# Patient Record
Sex: Female | Born: 1945 | Race: Black or African American | Hispanic: No | Marital: Married | Smoking: Current every day smoker
Health system: Southern US, Community
[De-identification: ages and names within clinical notes are randomized; demographics above are authoritative.]

## PROBLEM LIST (undated history)

## (undated) DIAGNOSIS — E039 Hypothyroidism, unspecified: Secondary | ICD-10-CM

## (undated) DIAGNOSIS — E1140 Type 2 diabetes mellitus with diabetic neuropathy, unspecified: Secondary | ICD-10-CM

## (undated) DIAGNOSIS — K5900 Constipation, unspecified: Secondary | ICD-10-CM

## (undated) DIAGNOSIS — Z9289 Personal history of other medical treatment: Secondary | ICD-10-CM

## (undated) DIAGNOSIS — E7800 Pure hypercholesterolemia, unspecified: Secondary | ICD-10-CM

## (undated) DIAGNOSIS — E079 Disorder of thyroid, unspecified: Secondary | ICD-10-CM

## (undated) DIAGNOSIS — J189 Pneumonia, unspecified organism: Secondary | ICD-10-CM

## (undated) DIAGNOSIS — E119 Type 2 diabetes mellitus without complications: Secondary | ICD-10-CM

## (undated) DIAGNOSIS — M1990 Unspecified osteoarthritis, unspecified site: Secondary | ICD-10-CM

## (undated) DIAGNOSIS — I10 Essential (primary) hypertension: Secondary | ICD-10-CM

## (undated) HISTORY — PX: ABDOMINAL HYSTERECTOMY: SHX81

## (undated) HISTORY — PX: APPENDECTOMY: SHX54

## (undated) HISTORY — PX: TONSILLECTOMY: SUR1361

## (undated) HISTORY — PX: BACK SURGERY: SHX140

## (undated) HISTORY — PX: TOTAL THYROIDECTOMY: SHX2547

## (undated) HISTORY — PX: SHOULDER SURGERY: SHX246

## (undated) HISTORY — PX: THYROID SURGERY: SHX805

## (undated) HISTORY — PX: EXPLORATORY LAPAROTOMY: SUR591

---

## 2004-06-04 ENCOUNTER — Encounter: Payer: Self-pay | Admitting: Internal Medicine

## 2004-07-01 ENCOUNTER — Ambulatory Visit: Payer: Self-pay | Admitting: Internal Medicine

## 2004-07-18 ENCOUNTER — Ambulatory Visit: Payer: Self-pay | Admitting: Internal Medicine

## 2004-08-05 ENCOUNTER — Encounter: Payer: Self-pay | Admitting: Internal Medicine

## 2004-08-15 ENCOUNTER — Ambulatory Visit: Payer: Self-pay | Admitting: Internal Medicine

## 2004-10-10 ENCOUNTER — Ambulatory Visit: Payer: Self-pay | Admitting: Internal Medicine

## 2004-10-24 ENCOUNTER — Ambulatory Visit: Payer: Self-pay | Admitting: Internal Medicine

## 2004-12-19 ENCOUNTER — Ambulatory Visit: Payer: Self-pay | Admitting: Internal Medicine

## 2005-02-24 ENCOUNTER — Ambulatory Visit: Payer: Self-pay | Admitting: Internal Medicine

## 2005-04-11 ENCOUNTER — Encounter: Payer: Self-pay | Admitting: Internal Medicine

## 2005-04-28 ENCOUNTER — Ambulatory Visit: Payer: Self-pay | Admitting: Internal Medicine

## 2005-10-27 ENCOUNTER — Ambulatory Visit: Payer: Self-pay | Admitting: Internal Medicine

## 2005-11-27 ENCOUNTER — Ambulatory Visit: Payer: Self-pay | Admitting: Internal Medicine

## 2005-11-27 LAB — CONVERTED CEMR LAB
ALT: 17 units/L (ref 0–40)
Calcium: 9.1 mg/dL (ref 8.4–10.5)
Chloride: 104 meq/L (ref 96–112)
Creatinine, Ser: 0.9 mg/dL (ref 0.4–1.2)
Creatinine,U: 97.7 mg/dL
Glucose, Bld: 83 mg/dL (ref 70–99)
Hemoglobin: 12.6 g/dL (ref 12.0–15.0)
Hgb A1c MFr Bld: 5.2 % (ref 4.6–6.0)
MCV: 90.4 fL (ref 78.0–100.0)
Microalb Creat Ratio: 3.1 mg/g (ref 0.0–30.0)
Sodium: 141 meq/L (ref 135–145)
TSH: 0.48 microintl units/mL (ref 0.35–5.50)
WBC: 5.2 10*3/uL (ref 4.5–10.5)

## 2006-03-11 DIAGNOSIS — G609 Hereditary and idiopathic neuropathy, unspecified: Secondary | ICD-10-CM

## 2006-03-11 DIAGNOSIS — E049 Nontoxic goiter, unspecified: Secondary | ICD-10-CM | POA: Insufficient documentation

## 2006-03-11 DIAGNOSIS — M949 Disorder of cartilage, unspecified: Secondary | ICD-10-CM

## 2006-03-11 DIAGNOSIS — M216X9 Other acquired deformities of unspecified foot: Secondary | ICD-10-CM

## 2006-03-11 DIAGNOSIS — I10 Essential (primary) hypertension: Secondary | ICD-10-CM | POA: Insufficient documentation

## 2006-03-11 DIAGNOSIS — M899 Disorder of bone, unspecified: Secondary | ICD-10-CM | POA: Insufficient documentation

## 2006-08-01 ENCOUNTER — Ambulatory Visit: Payer: Self-pay | Admitting: Internal Medicine

## 2006-08-01 DIAGNOSIS — E119 Type 2 diabetes mellitus without complications: Secondary | ICD-10-CM

## 2006-08-01 DIAGNOSIS — K56609 Unspecified intestinal obstruction, unspecified as to partial versus complete obstruction: Secondary | ICD-10-CM | POA: Insufficient documentation

## 2006-08-04 LAB — CONVERTED CEMR LAB
BUN: 11 mg/dL (ref 6–23)
Calcium: 9 mg/dL (ref 8.4–10.5)
Chloride: 106 meq/L (ref 96–112)
Cholesterol: 170 mg/dL (ref 0–200)
GFR calc Af Amer: 111 mL/min
GFR calc non Af Amer: 91 mL/min
Glucose, Bld: 69 mg/dL — ABNORMAL LOW (ref 70–99)
Hgb A1c MFr Bld: 5 % (ref 4.6–6.0)
LDL Cholesterol: 101 mg/dL — ABNORMAL HIGH (ref 0–99)
Microalb Creat Ratio: 2 mg/g (ref 0.0–30.0)
Microalb, Ur: 0.2 mg/dL (ref 0.0–1.9)
TSH: 0.37 microintl units/mL (ref 0.35–5.50)
Total CHOL/HDL Ratio: 3.5

## 2006-09-04 ENCOUNTER — Ambulatory Visit: Payer: Self-pay | Admitting: Internal Medicine

## 2006-09-05 ENCOUNTER — Encounter (INDEPENDENT_AMBULATORY_CARE_PROVIDER_SITE_OTHER): Payer: Self-pay | Admitting: *Deleted

## 2006-09-05 LAB — CONVERTED CEMR LAB: OCCULT 2: NEGATIVE

## 2006-11-22 ENCOUNTER — Telehealth (INDEPENDENT_AMBULATORY_CARE_PROVIDER_SITE_OTHER): Payer: Self-pay | Admitting: *Deleted

## 2006-12-17 ENCOUNTER — Encounter: Payer: Self-pay | Admitting: Internal Medicine

## 2006-12-18 ENCOUNTER — Telehealth (INDEPENDENT_AMBULATORY_CARE_PROVIDER_SITE_OTHER): Payer: Self-pay | Admitting: *Deleted

## 2006-12-18 ENCOUNTER — Encounter: Payer: Self-pay | Admitting: Internal Medicine

## 2006-12-18 ENCOUNTER — Other Ambulatory Visit: Admission: RE | Admit: 2006-12-18 | Discharge: 2006-12-18 | Payer: Self-pay | Admitting: Internal Medicine

## 2006-12-18 ENCOUNTER — Ambulatory Visit: Payer: Self-pay | Admitting: Internal Medicine

## 2006-12-18 DIAGNOSIS — K620 Anal polyp: Secondary | ICD-10-CM | POA: Insufficient documentation

## 2006-12-18 DIAGNOSIS — K621 Rectal polyp: Secondary | ICD-10-CM

## 2006-12-18 DIAGNOSIS — E785 Hyperlipidemia, unspecified: Secondary | ICD-10-CM

## 2006-12-24 ENCOUNTER — Encounter (INDEPENDENT_AMBULATORY_CARE_PROVIDER_SITE_OTHER): Payer: Self-pay | Admitting: *Deleted

## 2007-01-27 ENCOUNTER — Encounter: Payer: Self-pay | Admitting: Internal Medicine

## 2007-03-01 ENCOUNTER — Encounter: Payer: Self-pay | Admitting: Internal Medicine

## 2007-03-05 ENCOUNTER — Telehealth (INDEPENDENT_AMBULATORY_CARE_PROVIDER_SITE_OTHER): Payer: Self-pay | Admitting: *Deleted

## 2007-12-24 ENCOUNTER — Telehealth (INDEPENDENT_AMBULATORY_CARE_PROVIDER_SITE_OTHER): Payer: Self-pay | Admitting: Internal Medicine

## 2010-08-02 ENCOUNTER — Emergency Department (HOSPITAL_BASED_OUTPATIENT_CLINIC_OR_DEPARTMENT_OTHER)
Admission: EM | Admit: 2010-08-02 | Discharge: 2010-08-02 | Disposition: A | Discharge status: Home / Self Care | Payer: 59 | Attending: Emergency Medicine | Admitting: Emergency Medicine

## 2010-08-02 ENCOUNTER — Emergency Department (INDEPENDENT_AMBULATORY_CARE_PROVIDER_SITE_OTHER): Payer: 59

## 2010-08-02 ENCOUNTER — Encounter: Payer: Self-pay | Admitting: Family Medicine

## 2010-08-02 ENCOUNTER — Other Ambulatory Visit: Payer: Self-pay

## 2010-08-02 DIAGNOSIS — M5126 Other intervertebral disc displacement, lumbar region: Secondary | ICD-10-CM

## 2010-08-02 DIAGNOSIS — I10 Essential (primary) hypertension: Secondary | ICD-10-CM

## 2010-08-02 DIAGNOSIS — K7689 Other specified diseases of liver: Secondary | ICD-10-CM

## 2010-08-02 DIAGNOSIS — R079 Chest pain, unspecified: Secondary | ICD-10-CM

## 2010-08-02 DIAGNOSIS — R109 Unspecified abdominal pain: Secondary | ICD-10-CM

## 2010-08-02 DIAGNOSIS — K5730 Diverticulosis of large intestine without perforation or abscess without bleeding: Secondary | ICD-10-CM | POA: Insufficient documentation

## 2010-08-02 DIAGNOSIS — Z9089 Acquired absence of other organs: Secondary | ICD-10-CM | POA: Insufficient documentation

## 2010-08-02 DIAGNOSIS — R1013 Epigastric pain: Secondary | ICD-10-CM | POA: Insufficient documentation

## 2010-08-02 DIAGNOSIS — I708 Atherosclerosis of other arteries: Secondary | ICD-10-CM | POA: Insufficient documentation

## 2010-08-02 DIAGNOSIS — I700 Atherosclerosis of aorta: Secondary | ICD-10-CM | POA: Insufficient documentation

## 2010-08-02 DIAGNOSIS — Z90710 Acquired absence of both cervix and uterus: Secondary | ICD-10-CM | POA: Insufficient documentation

## 2010-08-02 DIAGNOSIS — F17200 Nicotine dependence, unspecified, uncomplicated: Secondary | ICD-10-CM | POA: Insufficient documentation

## 2010-08-02 DIAGNOSIS — E119 Type 2 diabetes mellitus without complications: Secondary | ICD-10-CM | POA: Insufficient documentation

## 2010-08-02 HISTORY — DX: Pneumonia, unspecified organism: J18.9

## 2010-08-02 HISTORY — DX: Essential (primary) hypertension: I10

## 2010-08-02 LAB — CBC
HCT: 39.2 % (ref 36.0–46.0)
Hemoglobin: 12.9 g/dL (ref 12.0–15.0)
MCV: 96.8 fL (ref 78.0–100.0)
RBC: 4.05 MIL/uL (ref 3.87–5.11)
RDW: 13.5 % (ref 11.5–15.5)
WBC: 6.1 10*3/uL (ref 4.0–10.5)

## 2010-08-02 LAB — RAPID URINE DRUG SCREEN, HOSP PERFORMED
Barbiturates: NOT DETECTED
Benzodiazepines: NOT DETECTED
Cocaine: NOT DETECTED
Opiates: NOT DETECTED
Tetrahydrocannabinol: NOT DETECTED

## 2010-08-02 LAB — COMPREHENSIVE METABOLIC PANEL
Albumin: 4.3 g/dL (ref 3.5–5.2)
Alkaline Phosphatase: 95 U/L (ref 39–117)
BUN: 15 mg/dL (ref 6–23)
Potassium: 3.9 mEq/L (ref 3.5–5.1)
Total Protein: 7.6 g/dL (ref 6.0–8.3)

## 2010-08-02 LAB — URINALYSIS, ROUTINE W REFLEX MICROSCOPIC
Bilirubin Urine: NEGATIVE
Glucose, UA: 100 mg/dL — AB
Hgb urine dipstick: NEGATIVE
Specific Gravity, Urine: 1.031 — ABNORMAL HIGH (ref 1.005–1.030)
pH: 6.5 (ref 5.0–8.0)

## 2010-08-02 LAB — DIFFERENTIAL
Eosinophils Relative: 6 % — ABNORMAL HIGH (ref 0–5)
Lymphocytes Relative: 34 % (ref 12–46)
Lymphs Abs: 2 10*3/uL (ref 0.7–4.0)
Monocytes Absolute: 0.6 10*3/uL (ref 0.1–1.0)
Monocytes Relative: 9 % (ref 3–12)

## 2010-08-02 MED ORDER — IOHEXOL 300 MG/ML  SOLN
80.0000 mL | Freq: Once | INTRAMUSCULAR | Status: AC | PRN
  Administered 2010-08-02: 80 mL via INTRAVENOUS

## 2010-08-02 MED ORDER — SODIUM CHLORIDE 0.9 % IV SOLN
80.0000 mg | Freq: Once | INTRAVENOUS | Status: AC
  Administered 2010-08-02: 80 mg via INTRAVENOUS
  Filled 2010-08-02: qty 80

## 2010-08-02 MED ORDER — ONDANSETRON HCL 4 MG/2ML IJ SOLN
4.0000 mg | Freq: Once | INTRAMUSCULAR | Status: AC
  Administered 2010-08-02: 4 mg via INTRAVENOUS
  Filled 2010-08-02: qty 2

## 2010-08-02 MED ORDER — LORAZEPAM 2 MG/ML IJ SOLN
1.0000 mg | Freq: Once | INTRAMUSCULAR | Status: AC
  Administered 2010-08-02: 1 mg via INTRAVENOUS
  Filled 2010-08-02: qty 1

## 2010-08-02 MED ORDER — SODIUM CHLORIDE 0.9 % IV BOLUS (SEPSIS)
500.0000 mL | Freq: Once | INTRAVENOUS | Status: AC
  Administered 2010-08-02: 1000 mL via INTRAVENOUS

## 2010-08-02 MED ORDER — PANTOPRAZOLE SODIUM 20 MG PO TBEC: 40.0000 mg | DELAYED_RELEASE_TABLET | Freq: Every day | ORAL | Status: DC

## 2010-08-02 MED ORDER — HYDROMORPHONE HCL 1 MG/ML IJ SOLN
1.0000 mg | Freq: Once | INTRAMUSCULAR | Status: AC
  Administered 2010-08-02: 1 mg via INTRAVENOUS
  Filled 2010-08-02: qty 1

## 2010-08-02 NOTE — ED Notes (Signed)
Pt sts "pressure in chest is gone but my back still hurts". Pt c/o right side pain with movement in bed. Pt currently drinking oral contrast.

## 2010-08-02 NOTE — ED Notes (Signed)
Pt c/o chest pain and shortness of breath described as pressure mid-chest since awakening yesterday. Pt sts pain is worse this morning and now radiating to right side and back.

## 2010-08-02 NOTE — ED Notes (Signed)
Pt denies pain and sts she feels "better".

## 2010-08-02 NOTE — ED Provider Notes (Signed)
History     Chief Complaint  Patient presents with  . Chest Pain   The history is provided by the patient.   The patient has had epigastic pain radiating to the right flank for two days..  Patient denies nausea but vomited yesterday  BM are normal for patient.  She does drink multiple drinks per day.  She states she coughed up clear phlegm this a.m.  There is increased pain with po intake.  She has a history of sbo but has not had cholecystectomy or history of pancreatitis.  She denies fever or chills.  The pain described by rn as chest is in the epigastrium.   Past Medical History  Diagnosis Date  . Hypertension   . Diabetes mellitus   . Pneumonia     Past Surgical History  Procedure Date  . Abdominal hysterectomy   . Tonsillectomy   . Appendectomy     No family history on file.  History  Substance Use Topics  . Smoking status: Current Everyday Smoker  . Smokeless tobacco: Not on file  . Alcohol Use: Yes    OB History    Grav Para Term Preterm Abortions TAB SAB Ect Mult Living                  Review of Systems  All other systems reviewed and are negative.    Physical Exam  SpO2 100%  Physical Exam  Nursing note and vitals reviewed. Constitutional: She appears well-developed and well-nourished.       Morbidly obese  HENT:  Head: Normocephalic and atraumatic.  Eyes: Pupils are equal, round, and reactive to light.  Neck: Normal range of motion.  Cardiovascular: Normal rate and regular rhythm.   Pulmonary/Chest: Effort normal and breath sounds normal.  Abdominal: Soft. Bowel sounds are normal. There is tenderness.    Musculoskeletal: Normal range of motion.  Neurological: She is alert.  Skin: Skin is warm and dry.  Psychiatric: She has a normal mood and affect.    ED Course  Procedures  MDM Labs normal except bs elevated. CT and chest x-Kristjan Derner without apparent acute change or need for surgical intervention.  Patient improved here and no vomiting.   Results discussed with patient and family. Results for orders placed during the hospital encounter of 08/02/10  COMPREHENSIVE METABOLIC PANEL      Component Value Range   Sodium 144  135 - 145 (mEq/L)   Potassium 3.9  3.5 - 5.1 (mEq/L)   Chloride 103  96 - 112 (mEq/L)   CO2 31  19 - 32 (mEq/L)   Glucose, Bld 169 (*) 70 - 99 (mg/dL)   BUN 15  6 - 23 (mg/dL)   Creatinine, Ser 1.47  0.50 - 1.10 (mg/dL)   Calcium 9.5  8.4 - 82.9 (mg/dL)   Total Protein 7.6  6.0 - 8.3 (g/dL)   Albumin 4.3  3.5 - 5.2 (g/dL)   AST 27  0 - 37 (U/L)   ALT 38 (*) 0 - 35 (U/L)   Alkaline Phosphatase 95  39 - 117 (U/L)   Total Bilirubin 0.3  0.3 - 1.2 (mg/dL)   GFR calc non Af Amer >60  >60 (mL/min)   GFR calc Af Amer >60  >60 (mL/min)  CBC      Component Value Range   WBC 6.1  4.0 - 10.5 (K/uL)   RBC 4.05  3.87 - 5.11 (MIL/uL)   Hemoglobin 12.9  12.0 - 15.0 (g/dL)   HCT  39.2  36.0 - 46.0 (%)   MCV 96.8  78.0 - 100.0 (fL)   MCH 31.9  26.0 - 34.0 (pg)   MCHC 32.9  30.0 - 36.0 (g/dL)   RDW 40.9  81.1 - 91.4 (%)   Platelets 282  150 - 400 (K/uL)  DIFFERENTIAL      Component Value Range   Neutrophils Relative 51  43 - 77 (%)   Neutro Abs 3.1  1.7 - 7.7 (K/uL)   Lymphocytes Relative 34  12 - 46 (%)   Lymphs Abs 2.0  0.7 - 4.0 (K/uL)   Monocytes Relative 9  3 - 12 (%)   Monocytes Absolute 0.6  0.1 - 1.0 (K/uL)   Eosinophils Relative 6 (*) 0 - 5 (%)   Eosinophils Absolute 0.3  0.0 - 0.7 (K/uL)   Basophils Relative 0  0 - 1 (%)   Basophils Absolute 0.0  0.0 - 0.1 (K/uL)  LIPASE, BLOOD      Component Value Range   Lipase 14  11 - 59 (U/L)  URINALYSIS, ROUTINE W REFLEX MICROSCOPIC      Component Value Range   Color, Urine YELLOW  YELLOW    Appearance CLEAR  CLEAR    Specific Gravity, Urine 1.031 (*) 1.005 - 1.030    pH 6.5  5.0 - 8.0    Glucose, UA 100 (*) NEGATIVE (mg/dL)   Hgb urine dipstick NEGATIVE  NEGATIVE    Bilirubin Urine NEGATIVE  NEGATIVE    Ketones, ur NEGATIVE  NEGATIVE (mg/dL)    Protein, ur NEGATIVE  NEGATIVE (mg/dL)   Urobilinogen, UA 1.0  0.0 - 1.0 (mg/dL)   Nitrite NEGATIVE  NEGATIVE    Leukocytes, UA NEGATIVE  NEGATIVE   DRUG SCREEN PANEL, EMERGENCY      Component Value Range   Opiates NONE DETECTED  NONE DETECTED    Cocaine NONE DETECTED  NONE DETECTED    Benzodiazepines NONE DETECTED  NONE DETECTED    Amphetamines NONE DETECTED  NONE DETECTED    Tetrahydrocannabinol NONE DETECTED  NONE DETECTED    Barbiturates NONE DETECTED  NONE DETECTED   Dg Chest 2 View  08/02/2010  *RADIOLOGY REPORT*  Clinical Data: Chest pain.  CHEST - 2 VIEW  Comparison: None  Findings: The cardiac silhouette, mediastinal and hilar contours are within normal limits.  The lungs are clear.  The bony thorax is intact.  IMPRESSION: No acute cardiopulmonary findings.  Original Report Authenticated By: P. Loralie Champagne, M.D.   Ct Abdomen Pelvis W Contrast  08/02/2010  *RADIOLOGY REPORT*  Clinical Data: Abdominal pain; history of appendectomy and hysterectomy; hypertension and diabetes  CT ABDOMEN AND PELVIS WITH CONTRAST  Technique:  Multidetector CT imaging of the abdomen and pelvis was performed following the standard protocol during bolus administration of intravenous contrast.  Contrast: 80 ml Omni 300  Comparison: None.  Findings: There is diffuse fatty infiltration of the liver. Superior to the gallbladder fossa at the portal vein bifurcation level, there is a 4.5 cm area of ill-defined hypodensity which exhibits no mass effect.  Normal vessels course through the area so this is felt to represent a more severe area of focal fatty infiltration.  There is also a small area of focal fatty sparing adjacent to the gallbladder fossa.    The lung bases are clear. The spleen, pancreas, adrenal glands, kidneys, urinary bladder have a normal appearance.  There is a small sclerotic bone island in the left femoral head.  Degenerative changes are noted  within the facet joint hypertrophy of the lower lumbar  spine.  A disc bulge is noted at L4-L5. There is atherosclerotic calcification within the distal abdominal aorta and iliac arteries.  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  The bowel is unremarkable with no evidence of gross inflammation or obstruction. There are several right-sided diverticula but no radiographic evidence of diverticulitis.  There is no pneumoperitoneum.  No free fluid or adenopathy are seen within the abdomen or pelvis.  IMPRESSION:  Marked fatty infiltration of the liver.  Diffuse disc bulge at L4-L5 and facet joint hypertrophy at the lower lumbar spine.  Original Report Authenticated By: Brandon Melnick, M.D.       Hilario Quarry, MD 08/02/10 1311  Hilario Quarry, MD 08/19/10 [phone]

## 2011-11-25 ENCOUNTER — Emergency Department (HOSPITAL_BASED_OUTPATIENT_CLINIC_OR_DEPARTMENT_OTHER): Payer: Managed Care, Other (non HMO)

## 2011-11-25 ENCOUNTER — Emergency Department (HOSPITAL_BASED_OUTPATIENT_CLINIC_OR_DEPARTMENT_OTHER)
Admission: EM | Admit: 2011-11-25 | Discharge: 2011-11-25 | Disposition: A | Discharge status: Home / Self Care | Payer: Managed Care, Other (non HMO) | Attending: Emergency Medicine | Admitting: Emergency Medicine

## 2011-11-25 ENCOUNTER — Encounter (HOSPITAL_BASED_OUTPATIENT_CLINIC_OR_DEPARTMENT_OTHER): Payer: Self-pay | Admitting: Emergency Medicine

## 2011-11-25 DIAGNOSIS — E119 Type 2 diabetes mellitus without complications: Secondary | ICD-10-CM | POA: Insufficient documentation

## 2011-11-25 DIAGNOSIS — R1013 Epigastric pain: Secondary | ICD-10-CM | POA: Insufficient documentation

## 2011-11-25 DIAGNOSIS — R079 Chest pain, unspecified: Secondary | ICD-10-CM | POA: Insufficient documentation

## 2011-11-25 DIAGNOSIS — Z8701 Personal history of pneumonia (recurrent): Secondary | ICD-10-CM | POA: Insufficient documentation

## 2011-11-25 DIAGNOSIS — I10 Essential (primary) hypertension: Secondary | ICD-10-CM | POA: Insufficient documentation

## 2011-11-25 DIAGNOSIS — F17200 Nicotine dependence, unspecified, uncomplicated: Secondary | ICD-10-CM | POA: Insufficient documentation

## 2011-11-25 DIAGNOSIS — Z79899 Other long term (current) drug therapy: Secondary | ICD-10-CM | POA: Insufficient documentation

## 2011-11-25 DIAGNOSIS — E7800 Pure hypercholesterolemia, unspecified: Secondary | ICD-10-CM | POA: Insufficient documentation

## 2011-11-25 HISTORY — DX: Pure hypercholesterolemia, unspecified: E78.00

## 2011-11-25 HISTORY — DX: Type 2 diabetes mellitus with diabetic neuropathy, unspecified: E11.40

## 2011-11-25 LAB — CBC
HCT: 38.5 % (ref 36.0–46.0)
MCH: 30.7 pg (ref 26.0–34.0)
MCHC: 32.7 g/dL (ref 30.0–36.0)
RDW: 12.9 % (ref 11.5–15.5)

## 2011-11-25 LAB — BASIC METABOLIC PANEL
BUN: 11 mg/dL (ref 6–23)
Calcium: 9.3 mg/dL (ref 8.4–10.5)
GFR calc Af Amer: >90 mL/min (ref >90)
GFR calc non Af Amer: >90 mL/min (ref >90)
Potassium: 3.4 mEq/L — ABNORMAL LOW (ref 3.5–5.1)
Sodium: 139 mEq/L (ref 135–145)

## 2011-11-25 MED ORDER — GI COCKTAIL ~~LOC~~
30.0000 mL | Freq: Once | ORAL | Status: AC
  Administered 2011-11-25: 30 mL via ORAL
  Filled 2011-11-25: qty 30

## 2011-11-25 MED ORDER — OMEPRAZOLE 20 MG PO CPDR: 20.0000 mg | DELAYED_RELEASE_CAPSULE | Freq: Every day | ORAL | Status: DC

## 2011-11-25 MED ORDER — TRAMADOL HCL 50 MG PO TABS: 50.0000 mg | ORAL_TABLET | Freq: Four times a day (QID) | ORAL | Status: DC | PRN

## 2011-11-25 MED ORDER — TRAMADOL HCL 50 MG PO TABS
50.0000 mg | ORAL_TABLET | Freq: Once | ORAL | Status: AC
  Administered 2011-11-25: 50 mg via ORAL
  Filled 2011-11-25: qty 1

## 2011-11-25 MED ORDER — SUCRALFATE 1 G PO TABS: 1.0000 g | ORAL_TABLET | Freq: Four times a day (QID) | ORAL | Status: DC

## 2011-11-25 MED ORDER — ASPIRIN 81 MG PO CHEW
324.0000 mg | CHEWABLE_TABLET | Freq: Once | ORAL | Status: AC
  Administered 2011-11-25: 324 mg via ORAL
  Filled 2011-11-25: qty 4

## 2011-11-25 NOTE — ED Notes (Signed)
Pt c/o generalized abdominal pain, radiating to epigastric area.  Some weakness, some dizziness and nausea yesterday.

## 2011-11-25 NOTE — ED Provider Notes (Signed)
History     CSN: [id number]  Arrival date & time 11/25/11  1132   First MD Initiated Contact with Patient 11/25/11 1159      Chief Complaint  Patient presents with  . Abdominal Pain  . Chest Pain    HPI The patient presents with concerns of ongoing abdominal and chest pain.  Symptoms began yesterday.  Since onset there has been sharp pain from her epigastrum  to her umbilicus, with radiation to sternum and to the posterior.  The pain is better with positioning (upright), worse when supine.  There is mild associated nausea, no vomiting, no diarrhea.  No dyspnea, no other chest pain. Symptoms are otherwise not appreciably changed by anything. Past Medical History  Diagnosis Date  . Hypertension   . Diabetes mellitus   . Pneumonia   . Hypercholesteremia   . Diabetic neuropathy     Past Surgical History  Procedure Date  . Abdominal hysterectomy   . Tonsillectomy   . Appendectomy     No family history on file.  History  Substance Use Topics  . Smoking status: Current Every Day Smoker  . Smokeless tobacco: Not on file  . Alcohol Use: Yes    OB History    Grav Para Term Preterm Abortions TAB SAB Ect Mult Living                  Review of Systems  Constitutional:       Per HPI, otherwise negative  HENT:       Per HPI, otherwise negative  Eyes: Negative.   Respiratory:       Per HPI, otherwise negative  Cardiovascular:       Per HPI, otherwise negative  Gastrointestinal: Positive for nausea. Negative for vomiting and diarrhea.  Genitourinary: Negative.   Musculoskeletal:       Per HPI, otherwise negative  Skin: Negative.   Neurological: Negative for syncope.    Allergies  Review of patient's allergies indicates no known allergies.  Home Medications   Current Outpatient Rx  Name  Route  Sig  Dispense  Refill  . PRAVASTATIN SODIUM 10 MG PO TABS   Oral   Take 10 mg by mouth daily.         . ASPIRIN 81 MG PO TABS   Oral   Take 81 mg by mouth daily.             Marland Kitchen GABAPENTIN 100 MG PO CAPS   Oral   Take 300 mg by mouth 5 (five) times daily.           Marland Kitchen LISINOPRIL 20 MG PO TABS   Oral   Take 20 mg by mouth daily.           Marland Kitchen METFORMIN HCL ER (MOD) 500 MG PO TB24   Oral   Take 500 mg by mouth daily with breakfast.            BP 180/81  Pulse 84  Temp 99.2 F (37.3 C) (Oral)  Resp 18  Ht 5\' 3"  (1.6 m)  Wt 175 lb (79.379 kg)  BMI 31.00 kg/m2  SpO2 95%  Physical Exam  Nursing note and vitals reviewed. Constitutional: She is oriented to person, place, and time. She appears well-developed and well-nourished. No distress.  HENT:  Head: Normocephalic and atraumatic.  Eyes: Conjunctivae normal and EOM are normal.  Cardiovascular: Normal rate and regular rhythm.   Pulmonary/Chest: Effort normal and breath sounds normal. No stridor.  No respiratory distress.  Abdominal: Soft. She exhibits no distension. There is tenderness. There is guarding. There is no rebound.  Musculoskeletal: She exhibits no edema.  Neurological: She is alert and oriented to person, place, and time. No cranial nerve deficit.  Skin: Skin is warm and dry.  Psychiatric: She has a normal mood and affect.    ED Course  Procedures (including critical care time)   Labs Reviewed  CBC  BASIC METABOLIC PANEL  TROPONIN I  LIPASE, BLOOD   No results found.   No diagnosis found.   Date: 11/25/2011  Rate: 78  Rhythm: normal sinus rhythm  QRS Axis: left  Intervals: normal  ST/T Wave abnormalities: normal  Conduction Disutrbances:none  Narrative Interpretation:   Old EKG Reviewed: unchanged UNREMARKABLE  O2- 99%ra, normal   I reviewed the patient's records, including a visit 7/12 for similar complaints.  CT at that time did not demonstrate acute findings, and there was no mention of aortic pathology.  1:40 PM Patient awakened from sleep to discuss results. MDM  This F p/w one day of abdominal pain.  The patient's description of pain that  radiates from her epigastrium to her sternum, her umbilicus and to the posterior there suspicion of gastroesophageal etiology, though pancreatic, aortic, cardiac etiologies are considered.  The patient's labs are reassuring, and on repeat exam the patient was sleeping.  A review of the patient's chart demonstrates a CAT scan from one year ago that did not demonstrate aortic changes.  With the patient's description of pain in his been there for one day cardiac etiology would likely be reflected in elevated troponin.  Given the patient's resolution of symptoms she was discharged in stable condition with a PPI, PMD and gastroenterology followup.       Gerhard Munch, MD 11/25/11 [phone]

## 2012-05-29 ENCOUNTER — Telehealth: Payer: Self-pay | Admitting: Gastroenterology

## 2013-03-05 ENCOUNTER — Telehealth: Payer: Self-pay | Admitting: Gastroenterology

## 2013-03-05 NOTE — Telephone Encounter (Signed)
Having typical but worse diffuse abdominal pain.  Instructed to take hyomax 0.375mg  bid, c/b if not improved

## 2014-04-27 ENCOUNTER — Encounter (HOSPITAL_BASED_OUTPATIENT_CLINIC_OR_DEPARTMENT_OTHER): Payer: Self-pay | Admitting: *Deleted

## 2014-04-27 ENCOUNTER — Emergency Department (HOSPITAL_BASED_OUTPATIENT_CLINIC_OR_DEPARTMENT_OTHER)
Admission: EM | Admit: 2014-04-27 | Discharge: 2014-04-27 | Disposition: A | Discharge status: Home / Self Care | Payer: Managed Care, Other (non HMO) | Attending: Emergency Medicine | Admitting: Emergency Medicine

## 2014-04-27 DIAGNOSIS — M5441 Lumbago with sciatica, right side: Secondary | ICD-10-CM | POA: Diagnosis not present

## 2014-04-27 DIAGNOSIS — I10 Essential (primary) hypertension: Secondary | ICD-10-CM | POA: Insufficient documentation

## 2014-04-27 DIAGNOSIS — M1990 Unspecified osteoarthritis, unspecified site: Secondary | ICD-10-CM | POA: Insufficient documentation

## 2014-04-27 DIAGNOSIS — M79604 Pain in right leg: Secondary | ICD-10-CM | POA: Diagnosis present

## 2014-04-27 DIAGNOSIS — Z79899 Other long term (current) drug therapy: Secondary | ICD-10-CM | POA: Diagnosis not present

## 2014-04-27 DIAGNOSIS — Z720 Tobacco use: Secondary | ICD-10-CM | POA: Insufficient documentation

## 2014-04-27 DIAGNOSIS — Z7982 Long term (current) use of aspirin: Secondary | ICD-10-CM | POA: Diagnosis not present

## 2014-04-27 DIAGNOSIS — Z8701 Personal history of pneumonia (recurrent): Secondary | ICD-10-CM | POA: Diagnosis not present

## 2014-04-27 DIAGNOSIS — E780 Pure hypercholesterolemia: Secondary | ICD-10-CM | POA: Insufficient documentation

## 2014-04-27 DIAGNOSIS — E1140 Type 2 diabetes mellitus with diabetic neuropathy, unspecified: Secondary | ICD-10-CM | POA: Diagnosis not present

## 2014-04-27 MED ORDER — MELOXICAM 7.5 MG PO TABS: 7.5000 mg | ORAL_TABLET | Freq: Every day | ORAL | Status: DC

## 2014-04-27 NOTE — ED Notes (Signed)
Pt amb to room 1 with slow, steady gait in nad. Pt reports pain from her pelvis down her right leg x January, gradually worsening, "feels like an electric shock." pt states she had similar symptoms in her arms in march, her pcp took xrays of her neck and told pt she had some "degeneration and arthritis".

## 2014-04-27 NOTE — ED Provider Notes (Signed)
CSN: [id number]     Arrival date & time 04/27/14  1007 History   First MD Initiated Contact with Patient 04/27/14 1030     Chief Complaint  Patient presents with  . Leg Pain     (Consider location/radiation/quality/duration/timing/severity/associated sxs/prior Treatment) HPI Pt is a 69yo female with hx of HTN, DM, pneumonia, hypercholesteremia, and diabetic neuropathy, presenting to ED with c/o intermittent lower back pain that radiates down her right leg.  Pt states pain has been intermittent since January, but gradually worsening over the last 1 week. Pain is descrbied as an Insurance account managerelectric shock.  Pain is 10/10 at worst.  Pt reports similar symptoms in her arms in March; her PCP took xrays of her neck which showed degeneration and arthritis.  Pt was advised she may have this throughout her back. Denies any new falls or heavy lifting. No fever, chills, n/v/d. Denies change in bowel or bladder habits.    Past Medical History  Diagnosis Date  . Hypertension   . Diabetes mellitus   . Pneumonia   . Hypercholesteremia   . Diabetic neuropathy    Past Surgical History  Procedure Laterality Date  . Abdominal hysterectomy    . Tonsillectomy    . Appendectomy     History reviewed. No pertinent family history. History  Substance Use Topics  . Smoking status: Current Every Day Smoker  . Smokeless tobacco: Not on file  . Alcohol Use: Yes   OB History    No data available     Review of Systems  Constitutional: Negative for fever and chills.  Gastrointestinal: Negative for nausea, vomiting, abdominal pain and diarrhea.  Musculoskeletal: Positive for myalgias, back pain, arthralgias and gait problem. Negative for joint swelling, neck pain and neck stiffness.       Bilateral lower back, Right hip, and right leg  Neurological: Negative for weakness and numbness.  All other systems reviewed and are negative.     Allergies  Review of patient's allergies indicates no known allergies.  Home  Medications   Prior to Admission medications   Medication Sig Start Date End Date Taking? Authorizing Provider  aspirin 81 MG tablet Take 81 mg by mouth daily.      Historical Provider, MD  gabapentin (NEURONTIN) 100 MG capsule Take 300 mg by mouth 5 (five) times daily.      Historical Provider, MD  lisinopril (PRINIVIL,ZESTRIL) 20 MG tablet Take 20 mg by mouth daily.      Historical Provider, MD  meloxicam (MOBIC) 7.5 MG tablet Take 1 tablet (7.5 mg total) by mouth daily. Take for 3-5 days during an exacerbation of pain 04/27/14   Junius FinnerErin O'Malley, PA-C  metFORMIN (GLUMETZA) 500 MG (MOD) 24 hr tablet Take 500 mg by mouth daily with breakfast.     Historical Provider, MD  omeprazole (PRILOSEC) 20 MG capsule Take 1 capsule (20 mg total) by mouth daily. 11/25/11   Gerhard Munchobert Lockwood, MD  pravastatin (PRAVACHOL) 10 MG tablet Take 10 mg by mouth daily.    Historical Provider, MD  sucralfate (CARAFATE) 1 G tablet Take 1 tablet (1 g total) by mouth 4 (four) times daily. 11/25/11   Gerhard Munchobert Lockwood, MD  traMADol (ULTRAM) 50 MG tablet Take 1 tablet (50 mg total) by mouth every 6 (six) hours as needed for pain. 11/25/11   Gerhard Munchobert Lockwood, MD   BP 153/79 mmHg  Pulse 80  Temp(Src) 98 F (36.7 C) (Oral)  Resp 18  Ht 5\' 3"  (1.6 m)  Wt 175 lb (  79.379 kg)  BMI 31.01 kg/m2  SpO2 100% Physical Exam  Constitutional: She is oriented to person, place, and time. She appears well-developed and well-nourished.  HENT:  Head: Normocephalic and atraumatic.  Eyes: EOM are normal.  Neck: Normal range of motion.  Cardiovascular: Normal rate.   Pulses:      Dorsalis pedis pulses are 2+ on the right side, and 2+ on the left side.  Pulmonary/Chest: Effort normal.  Musculoskeletal: Normal range of motion. She exhibits tenderness. She exhibits no edema.  Tenderness to lower back muscles w/o midline spinal tenderness.  Mild tenderness to Right lateral thigh. No calf tenderness. FROM bilateral lower extremities. Antalgic gait.    Neurological: She is alert and oriented to person, place, and time.  Skin: Skin is warm and dry.  Psychiatric: She has a normal mood and affect. Her behavior is normal.  Nursing note and vitals reviewed.   ED Course  Procedures (including critical care time) Labs Review Labs Reviewed - No data to display  Imaging Review No results found.   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 Signs, symptoms, and exam most c/w Right sided sciatica.   Do not believe imaging needed at this time. Not concerned for emergent process taking place. Will tx symptomatically as needed for pain. Not concerned for cauda equina, fractures, or dislocations. Rx: mobic. Home care instructions provided. Advised to f/u with PCP for recheck of symptoms if not improving. Return precautions provided. Pt verbalized understanding and agreement with tx plan.     Junius Finner, PA-C 04/27/14 1234  Geoffery Lyons, MD 04/28/14 [phone]

## 2014-09-08 ENCOUNTER — Encounter (HOSPITAL_COMMUNITY): Payer: Self-pay

## 2014-09-08 ENCOUNTER — Encounter (HOSPITAL_COMMUNITY)
Admission: RE | Admit: 2014-09-08 | Discharge: 2014-09-08 | Disposition: A | Discharge status: Home / Self Care | Payer: Managed Care, Other (non HMO) | Source: Ambulatory Visit | Attending: Orthopedic Surgery | Admitting: Orthopedic Surgery

## 2014-09-08 HISTORY — DX: Hypothyroidism, unspecified: E03.9

## 2014-09-08 HISTORY — DX: Constipation, unspecified: K59.00

## 2014-09-08 HISTORY — DX: Personal history of other medical treatment: Z92.89

## 2014-09-08 HISTORY — DX: Unspecified osteoarthritis, unspecified site: M19.90

## 2014-09-08 LAB — BASIC METABOLIC PANEL
ANION GAP: 7 (ref 5–15)
BUN: 22 mg/dL — ABNORMAL HIGH (ref 6–20)
CALCIUM: 9.9 mg/dL (ref 8.9–10.3)
CHLORIDE: 102 mmol/L (ref 101–111)
CO2: 28 mmol/L (ref 22–32)
Creatinine, Ser: 1.01 mg/dL — ABNORMAL HIGH (ref 0.44–1.00)
GFR calc non Af Amer: 57 mL/min — ABNORMAL LOW (ref >60)
Glucose, Bld: 140 mg/dL — ABNORMAL HIGH (ref 65–99)
Potassium: 5.9 mmol/L — ABNORMAL HIGH (ref 3.5–5.1)
SODIUM: 137 mmol/L (ref 135–145)

## 2014-09-08 LAB — CBC
HCT: 39.4 % (ref 36.0–46.0)
HEMOGLOBIN: 12.6 g/dL (ref 12.0–15.0)
MCH: 31.6 pg (ref 26.0–34.0)
MCHC: 32 g/dL (ref 30.0–36.0)
MCV: 98.7 fL (ref 78.0–100.0)
PLATELETS: 389 10*3/uL (ref 150–400)
RBC: 3.99 MIL/uL (ref 3.87–5.11)
RDW: 12.5 % (ref 11.5–15.5)
WBC: 8.7 10*3/uL (ref 4.0–10.5)

## 2014-09-08 LAB — SURGICAL PCR SCREEN
MRSA, PCR: NEGATIVE
Staphylococcus aureus: NEGATIVE

## 2014-09-08 LAB — GLUCOSE, CAPILLARY: Glucose-Capillary: 169 mg/dL — ABNORMAL HIGH (ref 65–99)

## 2014-09-08 MED ORDER — ACETAMINOPHEN 10 MG/ML IV SOLN: 1000.0000 mg | INTRAVENOUS | Status: DC

## 2014-09-08 MED ORDER — CEFAZOLIN SODIUM-DEXTROSE 2-3 GM-% IV SOLR
2.0000 g | INTRAVENOUS | Status: AC
  Administered 2014-09-09: 2 g via INTRAVENOUS
  Filled 2014-09-08: qty 50

## 2014-09-08 MED ORDER — DEXAMETHASONE SODIUM PHOSPHATE 4 MG/ML IJ SOLN
4.0000 mg | INTRAMUSCULAR | Status: AC
  Administered 2014-09-09: 4 mg via INTRAVENOUS
  Filled 2014-09-08: qty 1

## 2014-09-08 NOTE — Pre-Procedure Instructions (Signed)
Emma [name]  09/08/2014    Your procedure is scheduled on Wednesday, August 24  Report to [facility] Admitting at 10:00 A.M.  Call this number if you have problems the morning of surgery: [phone]    Remember:  Do not eat food or drink liquids after midnight.  Take these medicines the morning of surgery with A SIP OF WATER : gabapentin (NEURONTIN), omeprazole (PRILOSEC), Levothyroxine (Synthyroid)                   Take if needed: Hydrocodone- Acetamine.                   STOP taking :meloxicam (MOBIC), Vitamins, Fish Oil  What do I do about my diabetes medications?   Do not take oral diabetes medicines (pills) the morning of surgery.  THE NIGHT BEFORE SURGERY, take - units of -Insulin.    THE MORNING OF SURGERY, take -units of - Insulin.  Do not take other diabetes injectables the day of surgery including Byetta, Victoza, Bydureon, and Trulicity.                                How to Manage Your Diabetes Before Surgery   Check your blood sugar at least 4 times a day, 2 days before surgery to make sure that they are not too high or low.   Check your blood sugar the morning of your surgery when you wake up and every 2 hours until you get to the Short-Stay unit.  If your blood sugar is less than 70 mg/dL, you will need to treat for low blood sugar by:  Treat a low blood sugar (less than 70 mg/dL) with 1/2 cup of clear juice (cranberry or apple), 4 glucose tablets, OR glucose gel.  Recheck blood sugar in 15 minutes after treatment (to make sure it is greater than 70 mg/dL).  If blood sugar is not greater than 70 mg/dL on re-check, call [phone] for further instructions.   Report your blood sugar to the Short-Stay nurse when you get to Short-Stay.  Why is it important to control my blood sugar before and after surgery?   Improving blood sugar levels before and after surgery helps healing and can limit problems.  A way of improving blood sugar  control is eating a healthy diet by:  - Eating less sugar and carbohydrates  - Increasing activity/exercise  - Talk with your doctor about reaching your blood sugar goals  High blood sugars (greater than 180 mg/dL) can raise your risk of infections and slow down your recovery so you will need to focus on controlling your diabetes during the weeks before surgery.  Make sure that the doctor who takes care of your diabetes knows about your planned surgery including the date and location.             Do not wear jewelry, make-up or nail polish.   Do not wear lotions, powders, or perfumes.     Do not shave 48 hours prior to surgery.     Do not bring valuables to the hospital.   [facility] is not responsible for any belongings or valuables.  Contacts, dentures or bridgework may not be worn into surgery.  Leave your suitcase in the car.  After surgery it may be brought to your room.  For patients admitted to the hospital, discharge time will be determined by your  treatment team.  Patients discharged the day of surgery will not be allowed to drive home.    Name and phone number of your driver:   Special instructions:    Please read over the following fact sheets that you were given. Pain Booklet, Coughing and Deep Breathing and Surgical Site Infection Prevention

## 2014-09-08 NOTE — Progress Notes (Signed)
Mrs Emma [name] denies pain or shortness of breath.  Patient reported that she was see for medical clearance a couple of weeks ago at [facility].  Patient said she had an EKG at that time.  I faxed a request for information.Marland Kitchen

## 2014-09-08 NOTE — Pre-Procedure Instructions (Signed)
Emma [name]  09/08/2014    Your procedure is scheduled on Wednesday, August 24  Report to [facility] Admitting at 10:00 A.M.  Call this number if you have problems the morning of surgery: [phone]    Remember:  Do not eat food or drink liquids after midnight.  Take these medicines the morning of surgery with A SIP OF WATER : gabapentin (NEURONTIN), omeprazole (PRILOSEC), Levothyroxine (Synthyroid)                   Take if needed: Hydrocodone- Acetaminophen                   STOP taking :meloxicam (MOBIC), Vitamins, Fish Oil  What do I do about my diabetes medications?   Do not take oral diabetes medicines (pills) the morning of surgery.                               How to Manage Your Diabetes Before Surgery   Check your blood sugar at least 4 times a day, 2 days before surgery to make sure that they are not too high or low.   Check your blood sugar the morning of your surgery when you wake up and every 2 hours until you get to the Short-Stay unit.  If your blood sugar is less than 70 mg/dL, you will need to treat for low blood sugar by:  Treat a low blood sugar (less than 70 mg/dL) with 1/2 cup of clear juice (cranberry or apple), 4 glucose tablets, OR glucose gel.  Recheck blood sugar in 15 minutes after treatment (to make sure it is greater than 70 mg/dL).  If blood sugar is not greater than 70 mg/dL on re-check, call [phone] for further instructions.   Report your blood sugar to the Short-Stay nurse when you get to Short-Stay.  Why is it important to control my blood sugar before and after surgery?   Improving blood sugar levels before and after surgery helps healing and can limit problems.  A way of improving blood sugar control is eating a healthy diet by:  - Eating less sugar and carbohydrates  - Increasing activity/exercise  - Talk with your doctor about reaching your blood sugar goals  High blood sugars (greater than 180 mg/dL) can  raise your risk of infections and slow down your recovery so you will need to focus on controlling your diabetes during the weeks before surgery.  Make sure that the doctor who takes care of your diabetes knows about your planned surgery including the date and location.             Do not wear jewelry, make-up or nail polish.   Do not wear lotions, powders, or perfumes.     Do not shave 48 hours prior to surgery.     Do not bring valuables to the hospital.   [facility] is not responsible for any belongings or valuables.  Contacts, dentures or bridgework may not be worn into surgery.  Leave your suitcase in the car.  After surgery it may be brought to your room.  For patients admitted to the hospital, discharge time will be determined by your treatment team.  Patients discharged the day of surgery will not be allowed to drive home.    Name and phone number of your driver:   Special instructions:    Please read over the following fact sheets  that you were given. Pain Booklet, Coughing and Deep Breathing and Surgical Site Infection Prevention

## 2014-09-08 NOTE — Pre-Procedure Instructions (Signed)
Emma [name]  09/08/2014    Your procedure is scheduled on Wednesday, August 24  Report to [facility] Admitting at 10:00 A.M.  Call this number if you have problems the morning of surgery: [phone]    Remember:  Do not eat food or drink liquids after midnight.  Take these medicines the morning of surgery with A SIP OF WATER : gabapentin (NEURONTIN), omeprazole (PRILOSEC)                   Take if needed: Tramadol (Ultram)                   STOP taking :meloxicam (MOBIC)  What do I do about my diabetes medications?   Do not take oral diabetes medicines (pills) the morning of surgery.  THE NIGHT BEFORE SURGERY, take - units of -Insulin.    THE MORNING OF SURGERY, take -units of - Insulin.  Do not take other diabetes injectables the day of surgery including Byetta, Victoza, Bydureon, and Trulicity.                                How to Manage Your Diabetes Before Surgery   Check your blood sugar at least 4 times a day, 2 days before surgery to make sure that they are not too high or low.   Check your blood sugar the morning of your surgery when you wake up and every 2 hours until you get to the Short-Stay unit.  If your blood sugar is less than 70 mg/dL, you will need to treat for low blood sugar by:  Treat a low blood sugar (less than 70 mg/dL) with 1/2 cup of clear juice (cranberry or apple), 4 glucose tablets, OR glucose gel.  Recheck blood sugar in 15 minutes after treatment (to make sure it is greater than 70 mg/dL).  If blood sugar is not greater than 70 mg/dL on re-check, call [phone] for further instructions.   Report your blood sugar to the Short-Stay nurse when you get to Short-Stay.  Why is it important to control my blood sugar before and after surgery?   Improving blood sugar levels before and after surgery helps healing and can limit problems.  A way of improving blood sugar control is eating a healthy diet by:  - Eating less  sugar and carbohydrates  - Increasing activity/exercise  - Talk with your doctor about reaching your blood sugar goals  High blood sugars (greater than 180 mg/dL) can raise your risk of infections and slow down your recovery so you will need to focus on controlling your diabetes during the weeks before surgery.  Make sure that the doctor who takes care of your diabetes knows about your planned surgery including the date and location.             Do not wear jewelry, make-up or nail polish.   Do not wear lotions, powders, or perfumes.     Do not shave 48 hours prior to surgery.     Do not bring valuables to the hospital.   [facility] is not responsible for any belongings or valuables.  Contacts, dentures or bridgework may not be worn into surgery.  Leave your suitcase in the car.  After surgery it may be brought to your room.  For patients admitted to the hospital, discharge time will be determined by your treatment team.  Patients discharged  the day of surgery will not be allowed to drive home.    Name and phone number of your driver:   Special instructions:    Please read over the following fact sheets that you were given. Pain Booklet, Coughing and Deep Breathing and Surgical Site Infection Prevention

## 2014-09-09 ENCOUNTER — Inpatient Hospital Stay (HOSPITAL_COMMUNITY)
Admission: AD | Admit: 2014-09-09 | Discharge: 2014-09-12 | DRG: 520 | Disposition: A | Discharge status: Home Health | Payer: Managed Care, Other (non HMO) | Source: Ambulatory Visit | Attending: Orthopedic Surgery | Admitting: Orthopedic Surgery

## 2014-09-09 ENCOUNTER — Ambulatory Visit (HOSPITAL_COMMUNITY): Payer: Managed Care, Other (non HMO) | Admitting: Anesthesiology

## 2014-09-09 ENCOUNTER — Encounter (HOSPITAL_COMMUNITY): Payer: Self-pay | Admitting: Surgery

## 2014-09-09 ENCOUNTER — Encounter (HOSPITAL_COMMUNITY)
Admission: AD | Disposition: A | Discharge status: Home Health | Payer: Self-pay | Source: Ambulatory Visit | Attending: Orthopedic Surgery

## 2014-09-09 ENCOUNTER — Ambulatory Visit (HOSPITAL_COMMUNITY): Payer: Managed Care, Other (non HMO)

## 2014-09-09 DIAGNOSIS — E1140 Type 2 diabetes mellitus with diabetic neuropathy, unspecified: Secondary | ICD-10-CM | POA: Diagnosis present

## 2014-09-09 DIAGNOSIS — G8929 Other chronic pain: Secondary | ICD-10-CM | POA: Diagnosis present

## 2014-09-09 DIAGNOSIS — Z7982 Long term (current) use of aspirin: Secondary | ICD-10-CM

## 2014-09-09 DIAGNOSIS — I10 Essential (primary) hypertension: Secondary | ICD-10-CM | POA: Diagnosis present

## 2014-09-09 DIAGNOSIS — M545 Low back pain: Secondary | ICD-10-CM | POA: Diagnosis present

## 2014-09-09 DIAGNOSIS — Z419 Encounter for procedure for purposes other than remedying health state, unspecified: Secondary | ICD-10-CM

## 2014-09-09 DIAGNOSIS — E039 Hypothyroidism, unspecified: Secondary | ICD-10-CM | POA: Diagnosis present

## 2014-09-09 DIAGNOSIS — M5116 Intervertebral disc disorders with radiculopathy, lumbar region: Secondary | ICD-10-CM | POA: Diagnosis present

## 2014-09-09 DIAGNOSIS — F17210 Nicotine dependence, cigarettes, uncomplicated: Secondary | ICD-10-CM | POA: Diagnosis present

## 2014-09-09 DIAGNOSIS — M1990 Unspecified osteoarthritis, unspecified site: Secondary | ICD-10-CM | POA: Diagnosis present

## 2014-09-09 DIAGNOSIS — K5900 Constipation, unspecified: Secondary | ICD-10-CM | POA: Diagnosis present

## 2014-09-09 DIAGNOSIS — M549 Dorsalgia, unspecified: Secondary | ICD-10-CM | POA: Diagnosis present

## 2014-09-09 HISTORY — PX: LUMBAR LAMINECTOMY/DECOMPRESSION MICRODISCECTOMY: SHX5026

## 2014-09-09 LAB — GLUCOSE, CAPILLARY
GLUCOSE-CAPILLARY: 203 mg/dL — AB (ref 65–99)
GLUCOSE-CAPILLARY: 259 mg/dL — AB (ref 65–99)
Glucose-Capillary: 188 mg/dL — ABNORMAL HIGH (ref 65–99)

## 2014-09-09 LAB — HEMOGLOBIN A1C
HEMOGLOBIN A1C: 6.4 % — AB (ref 4.8–5.6)
Mean Plasma Glucose: 137 mg/dL

## 2014-09-09 SURGERY — LUMBAR LAMINECTOMY/DECOMPRESSION MICRODISCECTOMY
Anesthesia: General | Site: Back

## 2014-09-09 MED ORDER — BUPIVACAINE-EPINEPHRINE 0.25% -1:200000 IJ SOLN
INTRAMUSCULAR | Status: DC | PRN
  Administered 2014-09-09: 10 mL

## 2014-09-09 MED ORDER — MENTHOL 3 MG MT LOZG
1.0000 | LOZENGE | OROMUCOSAL | Status: DC | PRN
  Administered 2014-09-10: 3 mg via ORAL
  Filled 2014-09-09: qty 9

## 2014-09-09 MED ORDER — GLYCOPYRROLATE 0.2 MG/ML IJ SOLN
INTRAMUSCULAR | Status: DC | PRN
  Administered 2014-09-09: 0.4 mg via INTRAVENOUS

## 2014-09-09 MED ORDER — METHOCARBAMOL 500 MG PO TABS
500.0000 mg | ORAL_TABLET | Freq: Four times a day (QID) | ORAL | Status: DC | PRN
  Administered 2014-09-10 – 2014-09-12 (×6): 500 mg via ORAL
  Filled 2014-09-09 (×6): qty 1

## 2014-09-09 MED ORDER — FENTANYL CITRATE (PF) 100 MCG/2ML IJ SOLN
INTRAMUSCULAR | Status: DC | PRN
  Administered 2014-09-09 (×3): 50 ug via INTRAVENOUS
  Administered 2014-09-09: 100 ug via INTRAVENOUS

## 2014-09-09 MED ORDER — SODIUM CHLORIDE 0.9 % IJ SOLN
INTRAMUSCULAR | Status: AC
  Filled 2014-09-09: qty 10

## 2014-09-09 MED ORDER — ACETAMINOPHEN 10 MG/ML IV SOLN
1000.0000 mg | Freq: Four times a day (QID) | INTRAVENOUS | Status: AC
  Administered 2014-09-10 (×4): 1000 mg via INTRAVENOUS
  Filled 2014-09-09 (×3): qty 100

## 2014-09-09 MED ORDER — ONDANSETRON HCL 4 MG PO TABS: 4.0000 mg | ORAL_TABLET | Freq: Three times a day (TID) | ORAL | Status: DC | PRN

## 2014-09-09 MED ORDER — NEOSTIGMINE METHYLSULFATE 10 MG/10ML IV SOLN
INTRAVENOUS | Status: DC | PRN
  Administered 2014-09-09: 3 mg via INTRAVENOUS

## 2014-09-09 MED ORDER — OXCARBAZEPINE 300 MG PO TABS
300.0000 mg | ORAL_TABLET | Freq: Every day | ORAL | Status: DC
  Administered 2014-09-10 – 2014-09-12 (×3): 300 mg via ORAL
  Filled 2014-09-09 (×4): qty 1

## 2014-09-09 MED ORDER — MEPERIDINE HCL 25 MG/ML IJ SOLN: 6.2500 mg | INTRAMUSCULAR | Status: DC | PRN

## 2014-09-09 MED ORDER — METFORMIN HCL ER 500 MG PO TB24
500.0000 mg | ORAL_TABLET | Freq: Every day | ORAL | Status: DC
  Administered 2014-09-10 – 2014-09-12 (×3): 500 mg via ORAL
  Filled 2014-09-09 (×3): qty 1

## 2014-09-09 MED ORDER — SODIUM CHLORIDE 0.9 % IJ SOLN: 3.0000 mL | INTRAMUSCULAR | Status: DC | PRN

## 2014-09-09 MED ORDER — ONDANSETRON HCL 4 MG/2ML IJ SOLN
INTRAMUSCULAR | Status: DC | PRN
  Administered 2014-09-09: 4 mg via INTRAVENOUS

## 2014-09-09 MED ORDER — ACETAMINOPHEN 10 MG/ML IV SOLN
INTRAVENOUS | Status: AC
  Filled 2014-09-09: qty 100

## 2014-09-09 MED ORDER — ONDANSETRON HCL 4 MG/2ML IJ SOLN: 4.0000 mg | INTRAMUSCULAR | Status: DC | PRN

## 2014-09-09 MED ORDER — PHENYLEPHRINE HCL 10 MG/ML IJ SOLN
INTRAMUSCULAR | Status: DC | PRN
  Administered 2014-09-09 (×4): 80 ug via INTRAVENOUS

## 2014-09-09 MED ORDER — PROPOFOL 10 MG/ML IV BOLUS
INTRAVENOUS | Status: DC | PRN
  Administered 2014-09-09: 150 mg via INTRAVENOUS

## 2014-09-09 MED ORDER — THROMBIN 20000 UNITS EX SOLR
CUTANEOUS | Status: AC
  Filled 2014-09-09: qty 20000

## 2014-09-09 MED ORDER — BUPIVACAINE-EPINEPHRINE (PF) 0.25% -1:200000 IJ SOLN
INTRAMUSCULAR | Status: AC
  Filled 2014-09-09: qty 30

## 2014-09-09 MED ORDER — METHOCARBAMOL 500 MG PO TABS: 500.0000 mg | ORAL_TABLET | Freq: Three times a day (TID) | ORAL | Status: DC | PRN

## 2014-09-09 MED ORDER — GLIMEPIRIDE 4 MG PO TABS
4.0000 mg | ORAL_TABLET | Freq: Every day | ORAL | Status: DC
  Administered 2014-09-10 – 2014-09-12 (×3): 4 mg via ORAL
  Filled 2014-09-09 (×3): qty 1

## 2014-09-09 MED ORDER — MORPHINE SULFATE (PF) 2 MG/ML IV SOLN: 1.0000 mg | INTRAVENOUS | Status: DC | PRN

## 2014-09-09 MED ORDER — OXYCODONE HCL 5 MG PO TABS
10.0000 mg | ORAL_TABLET | ORAL | Status: DC | PRN
Start: 2014-09-09 — End: 2014-09-12
  Administered 2014-09-09 – 2014-09-12 (×15): 10 mg via ORAL
  Filled 2014-09-09 (×16): qty 2

## 2014-09-09 MED ORDER — LACTATED RINGERS IV SOLN
INTRAVENOUS | Status: DC
  Administered 2014-09-09 (×2): via INTRAVENOUS

## 2014-09-09 MED ORDER — LISINOPRIL 20 MG PO TABS
20.0000 mg | ORAL_TABLET | Freq: Every day | ORAL | Status: DC
  Administered 2014-09-10 – 2014-09-12 (×3): 20 mg via ORAL
  Filled 2014-09-09 (×3): qty 1

## 2014-09-09 MED ORDER — ONDANSETRON HCL 4 MG/2ML IJ SOLN
INTRAMUSCULAR | Status: AC
  Filled 2014-09-09: qty 2

## 2014-09-09 MED ORDER — PHENOL 1.4 % MT LIQD: 1.0000 | OROMUCOSAL | Status: DC | PRN

## 2014-09-09 MED ORDER — PROPOFOL 10 MG/ML IV BOLUS
INTRAVENOUS | Status: AC
  Filled 2014-09-09: qty 20

## 2014-09-09 MED ORDER — LIDOCAINE HCL (CARDIAC) 20 MG/ML IV SOLN
INTRAVENOUS | Status: DC | PRN
  Administered 2014-09-09: 50 mg via INTRAVENOUS

## 2014-09-09 MED ORDER — SODIUM CHLORIDE 0.9 % IJ SOLN
3.0000 mL | Freq: Two times a day (BID) | INTRAMUSCULAR | Status: DC
  Administered 2014-09-11 – 2014-09-12 (×3): 3 mL via INTRAVENOUS

## 2014-09-09 MED ORDER — ROCURONIUM BROMIDE 100 MG/10ML IV SOLN
INTRAVENOUS | Status: DC | PRN
  Administered 2014-09-09: 50 mg via INTRAVENOUS

## 2014-09-09 MED ORDER — PHENYLEPHRINE 40 MCG/ML (10ML) SYRINGE FOR IV PUSH (FOR BLOOD PRESSURE SUPPORT)
PREFILLED_SYRINGE | INTRAVENOUS | Status: AC
  Filled 2014-09-09: qty 10

## 2014-09-09 MED ORDER — CEFAZOLIN SODIUM 1-5 GM-% IV SOLN
1.0000 g | Freq: Three times a day (TID) | INTRAVENOUS | Status: AC
  Administered 2014-09-09 – 2014-09-10 (×2): 1 g via INTRAVENOUS
  Filled 2014-09-09 (×2): qty 50

## 2014-09-09 MED ORDER — MIDAZOLAM HCL 5 MG/5ML IJ SOLN
INTRAMUSCULAR | Status: DC | PRN
  Administered 2014-09-09: 2 mg via INTRAVENOUS

## 2014-09-09 MED ORDER — HYDROMORPHONE HCL 1 MG/ML IJ SOLN: 0.2500 mg | INTRAMUSCULAR | Status: DC | PRN

## 2014-09-09 MED ORDER — LEVOTHYROXINE SODIUM 100 MCG PO TABS
100.0000 ug | ORAL_TABLET | Freq: Every day | ORAL | Status: DC
  Administered 2014-09-10 – 2014-09-12 (×3): 100 ug via ORAL
  Filled 2014-09-09 (×3): qty 1

## 2014-09-09 MED ORDER — SODIUM CHLORIDE 0.9 % IV SOLN: 250.0000 mL | INTRAVENOUS | Status: DC

## 2014-09-09 MED ORDER — EPHEDRINE SULFATE 50 MG/ML IJ SOLN
INTRAMUSCULAR | Status: DC | PRN
  Administered 2014-09-09 (×2): 10 mg via INTRAVENOUS

## 2014-09-09 MED ORDER — OXYCODONE-ACETAMINOPHEN 10-325 MG PO TABS: 1.0000 | ORAL_TABLET | ORAL | Status: DC | PRN

## 2014-09-09 MED ORDER — LACTATED RINGERS IV SOLN
INTRAVENOUS | Status: DC
  Administered 2014-09-10 (×2): via INTRAVENOUS

## 2014-09-09 MED ORDER — PROMETHAZINE HCL 25 MG/ML IJ SOLN: 6.2500 mg | INTRAMUSCULAR | Status: DC | PRN

## 2014-09-09 MED ORDER — FENTANYL CITRATE (PF) 250 MCG/5ML IJ SOLN
INTRAMUSCULAR | Status: AC
  Filled 2014-09-09: qty 5

## 2014-09-09 MED ORDER — MIDAZOLAM HCL 2 MG/2ML IJ SOLN
INTRAMUSCULAR | Status: AC
  Filled 2014-09-09: qty 4

## 2014-09-09 MED ORDER — METHOCARBAMOL 1000 MG/10ML IJ SOLN
500.0000 mg | Freq: Four times a day (QID) | INTRAMUSCULAR | Status: DC | PRN
  Filled 2014-09-09: qty 5

## 2014-09-09 MED ORDER — EPHEDRINE SULFATE 50 MG/ML IJ SOLN
INTRAMUSCULAR | Status: AC
  Filled 2014-09-09: qty 1

## 2014-09-09 SURGICAL SUPPLY — 61 items
BUR EGG ELITE 4.0 (BURR) IMPLANT
BUR EGG ELITE 4.0MM (BURR)
BUR MATCHSTICK NEURO 3.0 LAGG (BURR) ×3 IMPLANT
CANISTER SUCTION 2500CC (MISCELLANEOUS) ×3 IMPLANT
CLOSURE STERI-STRIP 1/2X4 (GAUZE/BANDAGES/DRESSINGS) ×1
CLSR STERI-STRIP ANTIMIC 1/2X4 (GAUZE/BANDAGES/DRESSINGS) ×2 IMPLANT
CORDS BIPOLAR (ELECTRODE) ×3 IMPLANT
COVER SURGICAL LIGHT HANDLE (MISCELLANEOUS) ×3 IMPLANT
DRAIN CHANNEL 15F RND FF W/TCR (WOUND CARE) ×3 IMPLANT
DRAPE POUCH INSTRU U-SHP 10X18 (DRAPES) ×3 IMPLANT
DRAPE SURG 17X23 STRL (DRAPES) ×9 IMPLANT
DRAPE U-SHAPE 47X51 STRL (DRAPES) ×3 IMPLANT
DRSG MEPILEX BORDER 4X8 (GAUZE/BANDAGES/DRESSINGS) ×3 IMPLANT
DURAPREP 26ML APPLICATOR (WOUND CARE) ×3 IMPLANT
ELECT BLADE 4.0 EZ CLEAN MEGAD (MISCELLANEOUS)
ELECT CAUTERY BLADE 6.4 (BLADE) ×3 IMPLANT
ELECT PENCIL ROCKER SW 15FT (MISCELLANEOUS) ×3 IMPLANT
ELECT REM PT RETURN 9FT ADLT (ELECTROSURGICAL) ×3
ELECTRODE BLDE 4.0 EZ CLN MEGD (MISCELLANEOUS) IMPLANT
ELECTRODE REM PT RTRN 9FT ADLT (ELECTROSURGICAL) ×1 IMPLANT
EVACUATOR 1/8 PVC DRAIN (DRAIN) IMPLANT
EVACUATOR SILICONE 100CC (DRAIN) ×3 IMPLANT
GLOVE BIOGEL PI IND STRL 8 (GLOVE) ×1 IMPLANT
GLOVE BIOGEL PI IND STRL 8.5 (GLOVE) ×1 IMPLANT
GLOVE BIOGEL PI INDICATOR 8 (GLOVE) ×2
GLOVE BIOGEL PI INDICATOR 8.5 (GLOVE) ×2
GLOVE ORTHO TXT STRL SZ7.5 (GLOVE) ×3 IMPLANT
GLOVE SS BIOGEL STRL SZ 8.5 (GLOVE) ×1 IMPLANT
GLOVE SUPERSENSE BIOGEL SZ 8.5 (GLOVE) ×2
GOWN STRL REUS W/ TWL XL LVL3 (GOWN DISPOSABLE) ×2 IMPLANT
GOWN STRL REUS W/TWL 2XL LVL3 (GOWN DISPOSABLE) ×6 IMPLANT
GOWN STRL REUS W/TWL XL LVL3 (GOWN DISPOSABLE) ×6
KIT BASIN OR (CUSTOM PROCEDURE TRAY) ×3 IMPLANT
KIT ROOM TURNOVER OR (KITS) ×3 IMPLANT
NDL SPNL 18GX3.5 QUINCKE PK (NEEDLE) ×2 IMPLANT
NEEDLE 22X1 1/2 (OR ONLY) (NEEDLE) ×3 IMPLANT
NEEDLE SPNL 18GX3.5 QUINCKE PK (NEEDLE) ×6 IMPLANT
NS IRRIG 1000ML POUR BTL (IV SOLUTION) ×3 IMPLANT
PACK LAMINECTOMY ORTHO (CUSTOM PROCEDURE TRAY) ×3 IMPLANT
PACK UNIVERSAL I (CUSTOM PROCEDURE TRAY) ×3 IMPLANT
PAD ARMBOARD 7.5X6 YLW CONV (MISCELLANEOUS) ×6 IMPLANT
PATTIES SURGICAL .5 X.5 (GAUZE/BANDAGES/DRESSINGS) IMPLANT
PATTIES SURGICAL .5 X1 (DISPOSABLE) ×3 IMPLANT
SPONGE SURGIFOAM ABS GEL 100 (HEMOSTASIS) ×3 IMPLANT
SURGIFLO W/THROMBIN 8M KIT (HEMOSTASIS) IMPLANT
SUT BONE WAX W31G (SUTURE) ×3 IMPLANT
SUT MON AB 3-0 SH 27 (SUTURE) ×3
SUT MON AB 3-0 SH27 (SUTURE) ×1 IMPLANT
SUT VIC AB 0 CT1 27 (SUTURE) ×3
SUT VIC AB 0 CT1 27XBRD ANBCTR (SUTURE) ×1 IMPLANT
SUT VIC AB 1 CT1 27 (SUTURE) ×3
SUT VIC AB 1 CT1 27XBRD ANBCTR (SUTURE) ×1 IMPLANT
SUT VIC AB 1 CTX 36 (SUTURE) ×6
SUT VIC AB 1 CTX36XBRD ANBCTR (SUTURE) ×2 IMPLANT
SUT VIC AB 2-0 CT1 18 (SUTURE) ×3 IMPLANT
SYR BULB IRRIGATION 50ML (SYRINGE) ×3 IMPLANT
SYR CONTROL 10ML LL (SYRINGE) ×3 IMPLANT
TOWEL OR 17X24 6PK STRL BLUE (TOWEL DISPOSABLE) ×3 IMPLANT
TOWEL OR 17X26 10 PK STRL BLUE (TOWEL DISPOSABLE) ×3 IMPLANT
WATER STERILE IRR 1000ML POUR (IV SOLUTION) ×3 IMPLANT
YANKAUER SUCT BULB TIP NO VENT (SUCTIONS) ×3 IMPLANT

## 2014-09-09 NOTE — Transfer of Care (Signed)
Immediate Anesthesia Transfer of Care Note  Patient: Emma [name]  Procedure(s) Performed: Procedure(s): L4-L5 DECOMPRESSION  (N/A)  Patient Location: PACU  Anesthesia Type:General  Level of Consciousness: sedated and patient cooperative  Airway & Oxygen Therapy: Patient Spontanous Breathing  Post-op Assessment: Report given to RN and Post -op Vital signs reviewed and stable  Post vital signs: Reviewed  Last Vitals:  Filed Vitals:   09/09/14 1715  BP: 133/68  Pulse: 110  Temp: 36.8 C  Resp: 18    Complications: No apparent anesthesia complications

## 2014-09-09 NOTE — Brief Op Note (Signed)
09/09/2014  2:26 PM  PATIENT:  Emma [name]  69 y.o. female  PRE-OPERATIVE DIAGNOSIS:  LEFT L4/L5 RADICULAR PAIN   POST-OPERATIVE DIAGNOSIS:  LEFT L4/L5 RADICULAR PAIN   PROCEDURE:  Procedure(s): L4-L5 DECOMPRESSION  (N/A)  SURGEON:  Surgeon(s) and Role:    * Venita Lick, MD - Primary  PHYSICIAN ASSISTANT:   ASSISTANTS: carmen mayo   ANESTHESIA:   general  EBL:  Total I/O In: 1000 [I.V.:1000] Out: -   BLOOD ADMINISTERED:none  DRAINS: 1 hemovac  LOCAL MEDICATIONS USED:  MARCAINE     SPECIMEN:  No Specimen  DISPOSITION OF SPECIMEN:  N/A  COUNTS:  YES  TOURNIQUET:  * No tourniquets in log *  DICTATION: .Other Dictation: Dictation Number [id number]  PLAN OF CARE: Admit to inpatient   PATIENT DISPOSITION:  PACU - hemodynamically stable.

## 2014-09-09 NOTE — Anesthesia Procedure Notes (Signed)
Procedure Name: Intubation Date/Time: 09/09/2014 12:42 PM Performed by: Gavin Pound, Makiah Clauson J Pre-anesthesia Checklist: Patient identified, Timeout performed, Emergency Drugs available, Suction available and Patient being monitored Patient Re-evaluated:Patient Re-evaluated prior to inductionOxygen Delivery Method: Circle system utilized Preoxygenation: Pre-oxygenation with 100% oxygen Intubation Type: IV induction Ventilation: Mask ventilation without difficulty Laryngoscope Size: Mac and 3 Grade View: Grade I Tube type: Oral Tube size: 7.0 mm Number of attempts: 1 Airway Equipment and Method: Stylet Placement Confirmation: ETT inserted through vocal cords under direct vision,  breath sounds checked- equal and bilateral,  positive ETCO2 and CO2 detector Secured at: 22 cm Tube secured with: Tape Dental Injury: Teeth and Oropharynx as per pre-operative assessment

## 2014-09-09 NOTE — Anesthesia Preprocedure Evaluation (Signed)
Anesthesia Evaluation  Patient identified by MRN, date of birth, ID band Patient awake    Reviewed: Allergy & Precautions, NPO status , Patient's Chart, lab work & pertinent test results  Airway Mallampati: II  TM Distance: >3 FB Neck ROM: Full    Dental no notable dental hx.    Pulmonary pneumonia -, Current Smoker,  breath sounds clear to auscultation  Pulmonary exam normal       Cardiovascular hypertension, negative cardio ROS Normal cardiovascular examRhythm:Regular Rate:Normal     Neuro/Psych negative neurological ROS  negative psych ROS   GI/Hepatic negative GI ROS, Neg liver ROS,   Endo/Other  diabetesHypothyroidism   Renal/GU negative Renal ROS     Musculoskeletal  (+) Arthritis -,   Abdominal   Peds  Hematology negative hematology ROS (+)   Anesthesia Other Findings   Reproductive/Obstetrics                             Anesthesia Physical Anesthesia Plan  ASA: III  Anesthesia Plan: General   Post-op Pain Management:    Induction: Intravenous  Airway Management Planned: Oral ETT  Additional Equipment:   Intra-op Plan:   Post-operative Plan: Extubation in OR  Informed Consent: I have reviewed the patients History and Physical, chart, labs and discussed the procedure including the risks, benefits and alternatives for the proposed anesthesia with the patient or authorized representative who has indicated his/her understanding and acceptance.   Dental advisory given  Plan Discussed with: CRNA  Anesthesia Plan Comments:         Anesthesia Quick Evaluation

## 2014-09-09 NOTE — H&P (Signed)
History of Present Illness  The patient is a 69 year old female who comes in today for a preoperative History and Physical. The patient is scheduled for a L4-5 decompression to be performed by Dr. Debria Garret D. Shon Baton, MD at [facility] on 09-09-14 . Please see the hospital record for complete dictated history and physical. pt has DM type 2. last A1c was 6.4. The pt smokes 1 pack a day. The pt says she is going to stop after the surgery.   Allergies No Known Drug Allergies08/05/2014  Family History  Hypertension father Diabetes Mellitus father Cancer father  Social History  Not under pain contract No history of drug/alcohol rehab Number of flights of stairs before winded 1 Tobacco use 08/21/2014: current every day smoker smoke(d) 1 pack(s) per day Tobacco / smoke exposure 08/21/2014: no Current work status working full time Current drinker 08/21/2014: Currently drinks hard liquor only occasionally per week Exercise Exercises rarely Marital status widowed Living situation live alone  Medication History Norco (5-325MG  Tablet, 1 (one) Tablet Oral three times daily, as needed, Taken starting 08/21/2014) Active. Synthroid ( Tablet, Oral) Active. Aspirin (  Tablet, 1 (one) Oral) Active. Fish Oil Concentrate (  Capsule, 1 (one) Oral) Active. Lisinopril (  Tablet, Oral) Active. MetFORMIN HCl (  Tablet, Oral) Active. Gabapentin (  Tablet, Oral) Active. Glimepiride (  Tablet, Oral) Active. Fluticasone Propionate (50MCG/ACT Suspension, Nasal) Active. ProAir HFA (108 (90 Base)MCG/ACT Aerosol Soln, Inhalation) Active. Pravastatin Sodium (  Tablet, Oral) Active. Medications Reconciled  Past Surgical History  Hysterectomy partial (non-cancerous) Thyroidectomy; Total Tonsillectomy Dilation and Curettage of Uterus Appendectomy Cataract Surgery bilateral Colon Polyp Removal - Colonoscopy  Other Problems  High blood  pressure Peripheral Neuropathy Chronic Pain Diabetes Mellitus, Type II  Vitals 09/07/2014 10:43 AM Weight: 170 lb Height: 63in Body Surface Area: 1.8 m Body Mass Index: 30.11 kg/m  Temp.: 98.30F(Oral)  Pulse: 100 (Regular)  BP: 112/63 (Sitting, Left Arm, Standard)  Physical Exam  General General Appearance-Not in acute distress. Orientation-Oriented X3. Build & Nutrition-Well nourished and Well developed.  Integumentary General Characteristics Surgical Scars - no surgical scar evidence of previous lumbar surgery. Lumbar Spine-Skin examination of the lumbar spine is without deformity, skin lesions, lacerations or abrasions.  Chest and Lung Exam Auscultation Breath sounds - Normal and clear at anterior chest wall.  Cardiovascular Auscultation Rhythm - Regular rate and rhythm.  Abdomen Palpation/Percussion Palpation and Percussion of the abdomen reveal - Soft, Non Tender and No Rebound tenderness.  Peripheral Vascular Lower Extremity Palpation - Posterior tibial pulse - Bilateral - 2+. Dorsalis pedis pulse - Bilateral - 2+.  Neurologic Sensation Lower Extremity - Bilateral - sensation is intact in the lower extremity. Reflexes Patellar Reflex - Bilateral - 2+. Achilles Reflex - Bilateral - 2+. Clonus - Bilateral - clonus not present. Hoffman's Sign - Bilateral - Hoffman's sign not present. Testing Seated Straight Leg Raise - Right - Seated straight leg raise positive.  Musculoskeletal Spine/Ribs/Pelvis  Lumbosacral Spine: Inspection and Palpation - Tenderness - right lumbar paraspinals tender to palpation. Strength and Tone: Strength - Hip Flexion - Bilateral - 5/5. Knee Extension - Left - 5/5. Right - 4-/5. Knee Flexion - Left - 5/5. Right - 4-/5. Ankle Dorsiflexion - Bilateral - 5/5. Ankle Plantarflexion - Bilateral - 5/5. Heel walk - Bilateral - able to heel walk without difficulty. Toe Walk - Bilateral - able to walk on toes without  difficulty. Heel-Toe Walk - Bilateral - able to heel-toe walk without difficulty. ROM - Flexion - moderately decreased range of  motion and painful. Extension - mildly decreased range of motion. Left Lateral Bending - mildly decreased range of motion. Right Lateral Bending - mildly decreased range of motion. Pain - flexion is more painful than extension. Lumbosacral Spine - Waddell's Signs - no Waddell's signs present. Lower Extremity Range of Motion - No true hip, knee or ankle pain with range of motion. Gait and Station - [employer] - no assistive devices.  Goal Of Surgery:Discussed that goal of surgery is to reduce pain and improve function and quality of life. Patient is aware that despite all appropriate treatment that there pain and function could be the same, worse, or different. Posterior Lumbar Decompression/disectomy: Risks of surgery include infection, bleeding, nerve damage, death, stroke, paralysis, failure to heal, need for further surgery, ongoing or worse pain, need for further surgery, CSF leak, loss of bowel or bladder, and recurrent disc herniation or Stenosis which would necessitate need for further surgery.  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 She had eight days of near complete relief with an L4 right-sided transforaminal ESI.  IMAGING Her plain x-rays from an outside institution dated 05/06/2014 and MRI from 05/09/2014 were reviewed. No scoliosis. No spondylolisthesis. No significant collapse of the disc spaces. Lordosis is maintained. MRI shows an L4-5 right-sided foraminal and posterolateral disc causing L4 nerve irritation on the right side as well as some irritation to the traversing L5 nerve root. Plans Transcription  At this point in time, given the positive results of the ESI, the correlation of her clinical L4 radicular pain with the MRI,  I think it is reasonable to proceed with a lumbar decompression and discectomy and foraminotomy. I have reviewed the risks with the patient, which include infection, bleeding, nerve damage, death, stroke, paralysis, failure to heal, need for further surgery, ongoing or worst pain, loss in bowel and bladder control, leakage of spinal fluid, need for further surgery including fusion surgery. All of her questions were addressed. I have taken her out of work because she just is having horrific radicular pain. I have given her a prescription for tramadol as well as a handicap sticker. We will get preoperative medical clearance for surgery and then we will proceed in a timely fashion.

## 2014-09-09 NOTE — Anesthesia Postprocedure Evaluation (Signed)
Anesthesia Post Note  Patient: Emma [name]  Procedure(s) Performed: Procedure(s) (LRB): L4-L5 DECOMPRESSION  (N/A)  Anesthesia type: General  Patient location: PACU  Post pain: Pain level controlled  Post assessment: Post-op Vital signs reviewed  Last Vitals: BP 133/68 mmHg  Pulse 110  Temp(Src) 36.8 C (Oral)  Resp 18  Ht  (1.6 m)  Wt 158 lb (71.668 kg)  BMI 28.00 kg/m2  SpO2 99%  Post vital signs: Reviewed  Level of consciousness: sedated  Complications: No apparent anesthesia complications

## 2014-09-10 ENCOUNTER — Encounter (HOSPITAL_COMMUNITY): Payer: Self-pay | Admitting: Orthopedic Surgery

## 2014-09-10 LAB — GLUCOSE, CAPILLARY
GLUCOSE-CAPILLARY: 99 mg/dL (ref 65–99)
GLUCOSE-CAPILLARY: 128 mg/dL — AB (ref 65–99)
GLUCOSE-CAPILLARY: 158 mg/dL — AB (ref 65–99)
Glucose-Capillary: 167 mg/dL — ABNORMAL HIGH (ref 65–99)
Glucose-Capillary: 165 mg/dL — ABNORMAL HIGH (ref 65–99)

## 2014-09-10 MED ORDER — GABAPENTIN 600 MG PO TABS
600.0000 mg | ORAL_TABLET | Freq: Three times a day (TID) | ORAL | Status: DC
  Administered 2014-09-10 – 2014-09-12 (×6): 600 mg via ORAL
  Filled 2014-09-10 (×8): qty 1

## 2014-09-10 NOTE — Progress Notes (Signed)
    Subjective: Procedure(s) (LRB): L4-L5 DECOMPRESSION  (N/A) 1 Day Post-Op  Patient reports pain as 3 on 0-10 scale.  Reports decreased leg pain reports incisional back pain   Positive void - patient notes when she coughs she will urinate a small amount Negative bowel movement Positive flatus Negative chest pain or shortness of breath  Objective: Vital signs in last 24 hours: Temp:  [98.1 F (36.7 C)-98.9 F (37.2 C)] 98.5 F (36.9 C) (08/25 0553) Pulse Rate:  [94-112] 105 (08/25 0553) Resp:  [14-23] 18 (08/25 0553) BP: (116-148)/(58-77) 122/66 mmHg (08/25 0553) SpO2:  [98 %-100 %] 98 % (08/25 0553) Weight:  [71.668 kg (158 lb)] 71.668 kg (158 lb) (08/24 1006)  Intake/Output from previous day: 08/24 0701 - 08/25 0700 In: 1935 [I.V.:1935] Out: 30 [Drains:30]  Labs:  Recent Labs  09/08/14 1350  WBC 8.7  RBC 3.99  HCT 39.4  PLT 389    Recent Labs  09/08/14 1350  NA 137  K 5.9*  CL 102  CO2 28  BUN 22*  CREATININE 1.01*  GLUCOSE 140*  CALCIUM 9.9   No results for input(s): LABPT, INR in the last 72 hours.  Physical Exam: Neurologically intact ABD soft Intact pulses distally Incision: dressing C/D/I Compartment soft Normal rectal tone, perianal sensation intact to light touch and pin prick  Assessment/Plan: Patient stable  Continue mobilization with physical therapy Continue care  Up with therapy D/C IV fluids  Will plan on d/c drain and dressing change this afternoon Possible d/c to home today - more likely given slow mobilization will d/c Friday.  Venita Lick, MD [facility] [phone]

## 2014-09-10 NOTE — Care Management Note (Signed)
Case Management Note  Patient Details  Name: Sunita Demond MRN: [id number] Date of Birth: Dec 07, 1945  Subjective/Objective:  69 yr old female s/p L4 laminectomy.                   Action/Plan:  Case manager spoke with patient concerning her discharge plans. Patient has not done well with therapy. PT recommends possible rehab. Patient states she has no one to stay with her but has friends that will come by to check on her and bring her food. She isnt interested in going to rehab anywhere. Choice was offered for Home Health, referral was called to [place]. Social worker also notified      Expected Discharge Date:   09/12/14               Expected Discharge Plan:   Home with Home Health  In-House Referral:  Clinical Social Work  Discharge planning Services  CM Consult  Post Acute Care Choice:  Durable Medical Equipment, Home Health Choice offered to:  Patient  DME Arranged:  3-N-1, Walker rolling DME Agency:  [facility]  HH Arranged:  PT HH Agency:  [facility]  Status of Service:  In process, will continue to follow  Medicare Important Message Given:    Date Medicare IM Given:    Medicare IM give by:    Date Additional Medicare IM Given:    Additional Medicare Important Message give by:     If discussed at Long Length of Stay Meetings, dates discussed:    Additional Comments:  Durenda Guthrie, RN 09/10/2014, 11:32 AM

## 2014-09-10 NOTE — Op Note (Signed)
NAMEMarland Kitchen  Emma, [name]           ACCOUNT NO.:  [id number]  MEDICAL RECORD NO.:  [id number]  LOCATION:  5N10C                        FACILITY:  [facility]  PHYSICIAN:  Jaliya Siegmann D. Shon Baton, M.D. 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  OPERATIVE PROCEDURE:  L4 laminotomy with lateral recess decompression and diskectomy.  COMPLICATIONS:  None.  CONDITION:  Stable.  FIRST ASSISTANT:  [place], [place]  COMPLICATIONS:  None.  CONDITION:  Stable.  HISTORY:  This is a very pleasant woman who has been having severe right radicular leg pain with dysesthesias and loss of quality of life. Despite injection therapy and physical therapy, her symptoms persisted. As a result of the correlation with clinical findings and MRI, we elected to proceed with the decompression.  All appropriate risks, benefits, and alternatives were discussed with the patient and consent was obtained.  OPERATIVE NOTE:  The patient was brought to the operating room, placed supine on the operating table.  After successful induction of general anesthesia and endotracheal intubation, TEDs, SCDs were applied.  She was turned prone onto the Wilson frame and all bony prominences were well padded.  The back was prepped and draped in standard fashion.  Time- out was taken to confirm the patient, procedure, and all other pertinent important data.  Once this was done, needles were placed into the back for localization of incision.  I infiltrated the midline with 0.25% Marcaine and then incised over the L4-5 disk space.  Sharp dissection was carried out down to the deep fascia.  The deep fascia was sharply incised and exposed the right  paraspinal region.  I then placed a Penfield 4 underneath the lamina of L4, took an x-ray to confirm that I was at the appropriate level.  Once this was confirmed, a Taylor retractor was placed to the lateral aspect of the facet complex, which allowed good visualization of the posterolateral aspect of the spine.  I used 3 and 4 mm Kerrison to perform a very generous laminotomy of L4.  I then decompressed into the lateral recess by trimming down the osteophyte from the medial aspect of the inferior 4 and superior 5 facet complex.  I released the ligamentum flavum from the leading edge of L5 and then resected it and exposed the posterior aspect of the thecal sac. I then dissected into the lateral recess to further confirm decompressing out to the level of the medial aspect of the pedicle.  I then performed a generous foraminotomy of L5.  At this point, my [facility] could freely pass underneath the pedicle and out the L5 foramen.  The L5 nerve root was no longer under any tension.  I identified the disk space and protected with thecal sac with a nerve root retractor.  An annulotomy was performed with a 15 blade scalpel and using a combination of pituitary rongeurs, curettes, and Kerrison rongeurs, I removed the fragment of disk that had herniated. Superiorly, I also noticed that  there was a sub annular disk herniation. I enlarged my laminotomy until I was superior to the disk space.  I could now use my Epstein curette to decompress the osteophyte and removed the soft disk herniation that was causing the posterolateral recess stenosis.  Using micro pituitary rongeurs, I removed any free fragments of disk material.  At this point, I could freely take my [facility] and sweep it underneath the thecal sac superior and inferior to the disk space.  I could palpate it up to the lateral recess to the level of the L4 foramen down to the L5 foramen.  At this point, there was no neural  tension.  I completely decompressed the area.  I obtained hemostasis using bipolar electrocautery and FloSeal.  The wound was copiously irrigated with normal saline, and thrombin-soaked Gelfoam patty was placed over the exposed lamina.  I then placed a drain and then closed the deep fascia with interrupted #1 Vicryl sutures, superficial with 2-0 Vicryl sutures and a 3-0 Monocryl for the skin. Steri-Strips and dry dressing were applied and the patient was ultimately extubated and transferred to PACU without incident.  At the end of the case, all needle and sponge counts were correct.  There were no adverse intraoperative events.     Matheu Ploeger D. Shon Baton, M.D.     DDB/MEDQ  D:  09/09/2014  T:  09/10/2014  Job:  [id number]

## 2014-09-10 NOTE — Evaluation (Signed)
Physical Therapy Evaluation Patient Details Name: Emma [name] MRN: [id number] DOB: 27-Jun-1945 Today's Date: 09/10/2014   History of Present Illness  L4-5 radicular pain s/p decompression  Clinical Impression  Patient with limitations with initial mobilization. Posterior loss of balance with initial standing with assist to recover. Able to ambulate 12 feet with rw and min guard, patient reporting feeling unsteady and requesting return to bed. Mod assist and cues needed for sit to supine with transfers. Patient reports living alone and states that she will not have any support at home.     Follow Up Recommendations Other (comment) (unclear at this time)    Equipment Recommendations  Rolling walker with 5" wheels    Recommendations for Other Services OT consult     Precautions / Restrictions Precautions Precautions: Back Required Braces or Orthoses: Spinal Brace Spinal Brace: Applied in sitting position Restrictions Weight Bearing Restrictions: No      Mobility  Bed Mobility Overal bed mobility: Needs Assistance Bed Mobility: Sit to Supine       Sit to supine: Mod assist   General bed mobility comments: moderate assist with bilateral LEs and cues for logroll.   Transfers Overall transfer level: Needs assistance Equipment used: Rolling walker (2 wheeled) Transfers: Sit to/from Stand Sit to Stand: Min assist         General transfer comment: loss of balance posteriorly with standing, moderate assist to recover. Patient reports feeling "woozy" and dizzy with initial standing  Ambulation/Gait Ambulation/Gait assistance: Min guard Ambulation Distance (Feet): 12 Feet Assistive device: Rolling walker (2 wheeled) Gait Pattern/deviations: Step-through pattern Gait velocity: decreased   General Gait Details: mild instability with ambulation and limited distance as patient reporting that she was feeling unsteady.   Stairs            Wheelchair Mobility     Modified Rankin (Stroke Patients Only)       Balance Overall balance assessment: Needs assistance Sitting-balance support: No upper extremity supported Sitting balance-Leahy Scale: Good     Standing balance support: Bilateral upper extremity supported Standing balance-Leahy Scale: Poor Standing balance comment: posterior loss of balance with initial standing                             Pertinent Vitals/Pain Pain Assessment: 0-10 Pain Score: 7  Pain Location: Back Pain Descriptors / Indicators: Sore;Aching Pain Intervention(s): Monitored during session    Home Living Family/patient expects to be discharged to:: Private residence Living Arrangements: Alone Available Help at Discharge: Other (Comment) (none) Type of Home: Apartment Home Access: Level entry       Home Equipment: Cane - single point Additional Comments: might have some friends who could check on her occasionally    Prior Function Level of Independence: Independent with assistive device(s)         Comments: using SPC     Hand Dominance        Extremity/Trunk Assessment               Lower Extremity Assessment: Generalized weakness         Communication   Communication: No difficulties  Cognition Arousal/Alertness: Awake/alert Behavior During Therapy: WFL for tasks assessed/performed Overall Cognitive Status: Within Functional Limits for tasks assessed                      General Comments      Exercises        Assessment/Plan  PT Assessment Patient needs continued PT services  PT Diagnosis Difficulty walking;Generalized weakness;Acute pain   PT Problem List Decreased strength;Decreased range of motion;Decreased activity tolerance;Decreased balance;Decreased mobility;Decreased knowledge of use of DME  PT Treatment Interventions DME instruction;Gait training;Functional mobility training;Therapeutic activities;Therapeutic exercise;Balance  training;Patient/family education   PT Goals (Current goals can be found in the Care Plan section) Acute Rehab PT Goals Patient Stated Goal: be able to go home PT Goal Formulation: With patient Time For Goal Achievement: 09/24/14 Potential to Achieve Goals: Good    Frequency Min 4X/week   Barriers to discharge Decreased caregiver support patient reports living alone, no available support    Co-evaluation               End of Session Equipment Utilized During Treatment: Gait belt;Back brace Activity Tolerance: Other (comment);Patient limited by fatigue (patient reports feeling unsteady, returned to bed. ) Patient left: in bed;with call bell/phone within reach Nurse Communication: Mobility status;Precautions         Time: 0454-0981 PT Time Calculation (min) (ACUTE ONLY): 17 min   Charges:   PT Evaluation $Initial PT Evaluation Tier I: 1 Procedure     PT G Codes:        Christiane Ha, PT, CSCS Pager [phone] Office [phone]  09/10/2014, 11:17 AM

## 2014-09-10 NOTE — Progress Notes (Signed)
Patient ID: Emma [name], female   DOB: 06-Aug-1945, 69 y.o.   MRN: [id number]    Subjective: 1 Day Post-Op Procedure(s) (LRB): L4-L5 DECOMPRESSION  (N/A) Patient reports pain as 4 on 0-10 scale.   Denies CP or SOB.  Voiding without difficulty. Positive flatus. Objective: Vital signs in last 24 hours: Temp:  [98.1 F (36.7 C)-98.7 F (37.1 C)] 98.5 F (36.9 C) (08/25 0553) Pulse Rate:  [105-112] 105 (08/25 0553) Resp:  [14-23] 18 (08/25 0553) BP: (116-148)/(58-72) 122/66 mmHg (08/25 0553) SpO2:  [98 %-100 %] 98 % (08/25 0553)  Intake/Output from previous day: 08/24 0701 - 08/25 0700 In: 1935 [I.V.:1935] Out: 30 [Drains:30] Intake/Output this shift:    Labs:  Recent Labs  09/08/14 1350  HGB 12.6    Recent Labs  09/08/14 1350  WBC 8.7  RBC 3.99  HCT 39.4  PLT 389    Recent Labs  09/08/14 1350  NA 137  K 5.9*  CL 102  CO2 28  BUN 22*  CREATININE 1.01*  GLUCOSE 140*  CALCIUM 9.9   No results for input(s): LABPT, INR in the last 72 hours.  Physical Exam: ABD soft Sensation intact distally Intact pulses distally Incision: dressing C/D/I Compartment soft  Assessment/Plan: 1 Day Post-Op Procedure(s) (LRB): L4-L5 DECOMPRESSION  (N/A) Advance diet Up with therapy  Restarted the pts Gabapentin for Diabetic Neuropathy Pulled the pts incisional drain  Emma [name], [place] for Dr. Venita Lick [facility] [phone] 09/10/2014, 12:47 PM

## 2014-09-10 NOTE — Progress Notes (Signed)
Occupational Therapy Evaluation Patient Details Name: Emma [name] MRN: [id number] DOB: 1945/08/04 Today's Date: 09/10/2014    History of Present Illness L4-5 radicular pain s/p decompression   Clinical Impression   Pt admitted with the above diagnoses and presents with below problem list. Pt will benefit from continued acute OT to address the below listed deficits and maximize independence with BADLs prior to d/c to venue below. PTA pt was mod I with ADLs. Pt presents with UE/LE generalized weakness, decreased static standing balance impacting level of assist with ADLs. Pt at mod A level for LB ADLs and transfers. Ambulation not attempted this session due to increased dizziness in standing position. Pt with posterior lean in standing and reporting BLE weakness. Pt needing cues during session to maintain back precautions.  Pt reports she does not have 24 hour assist at home therefore given current assist level with ADLs recommending SNF at this time. OT to continue to follow acutely.      Follow Up Recommendations  SNF;Supervision/Assistance - 24 hour    Equipment Recommendations  Other (comment) (TBD next venue)    Recommendations for Other Services       Precautions / Restrictions Precautions Precautions: Back Precaution Comments: educated on BAT precautions  Required Braces or Orthoses: Spinal Brace Spinal Brace: Applied in sitting position Restrictions Weight Bearing Restrictions: No      Mobility Bed Mobility Overal bed mobility: Needs Assistance Bed Mobility: Rolling;Sidelying to Sit;Sit to Sidelying Rolling: Min assist Sidelying to sit: Min assist   Sit to supine: Mod assist Sit to sidelying: Min assist General bed mobility comments: Assist to maintain precautions during rolling, powerup to EOB during sidelying>EOB, and to advance BLE onto bed during sit>sidelying.  Transfers Overall transfer level: Needs assistance Equipment used: Rolling walker (2  wheeled) Transfers: Sit to/from Stand Sit to Stand: Mod assist         General transfer comment: Mod A during power up, to control descent and  second to posterior lean. Pt cued to shift weight onto feet. Pt c/o dizziness in standing.    Balance Overall balance assessment: Needs assistance Sitting-balance support: No upper extremity supported;Feet supported Sitting balance-Leahy Scale: Fair Sitting balance - Comments: sat EOB to don/doff back brace, occassional UE support with hand on mattress at times Postural control: Posterior lean Standing balance support: Bilateral upper extremity supported;During functional activity Standing balance-Leahy Scale: Poor Standing balance comment: Pt with posterior lean during sit<>stand, mod A during transfer and inital standing, with cueing pt able to achieve min A at times in standing. Requesting to sit back down due to dizziness.                             ADL Overall ADL's : Needs assistance/impaired Eating/Feeding: Set up;Sitting   Grooming: Set up;Cueing for compensatory techniques;Sitting   Upper Body Bathing: Minimal assitance;Sitting;Cueing for compensatory techniques   Lower Body Bathing: Moderate assistance;Sit to/from stand;Sitting/lateral leans;With adaptive equipment   Upper Body Dressing : Sitting;Cueing for compensatory techniques;Min guard Upper Body Dressing Details (indicate cue type and reason): donned/doffed back brace min guard due to nausea, cues for back brace positioning Lower Body Dressing: Moderate assistance;With adaptive equipment;Cueing for compensatory techniques;Cueing for back precautions;Sit to/from stand   Toilet Transfer: Moderate assistance;Stand-pivot;BSC;RW   Toileting- Clothing Manipulation and Hygiene: Moderate assistance;With adaptive equipment;Cueing for compensatory techniques;Cueing for back precautions;Sit to/from stand;Sitting/lateral lean       Functional mobility during ADLs:  (not  attempted  due to nausea, increased dizziness. ) General ADL Comments: Pt presents with UE/LE generalized weakness, decreased static standing balance impacting level of assist with ADLs. Pt completed bed mobilitty as detailed below. Pt sat for about 5 minutes prior to standing due to nausea. Pt stood about 1.5 minutes EOB with mod A to stand with pt leaning posteriorly. Mod-Min A to maintain standing position with pt requesting to sit down again due to dizziness. Pt with limited assist at d/c. Discussed SNF recommendation at this time. Pt needing verbal and at times tactile cues to maintain back precautions.     Vision     Perception     Praxis      Pertinent Vitals/Pain Pain Assessment: 0-10 Pain Score: 6  Pain Location: back Pain Descriptors / Indicators: Aching;Sore Pain Intervention(s): Limited activity within patient's tolerance;Monitored during session;Premedicated before session;Repositioned;Utilized relaxation techniques     Hand Dominance     Extremity/Trunk Assessment Upper Extremity Assessment Upper Extremity Assessment: Generalized weakness   Lower Extremity Assessment Lower Extremity Assessment: Defer to PT evaluation       Communication Communication Communication: No difficulties   Cognition Arousal/Alertness: Awake/alert Behavior During Therapy: WFL for tasks assessed/performed Overall Cognitive Status: Within Functional Limits for tasks assessed       Memory: Decreased recall of precautions             General Comments       Exercises       Shoulder Instructions      Home Living Family/patient expects to be discharged to:: Private residence Living Arrangements: Alone Available Help at Discharge: Other (Comment) Type of Home: Apartment Home Access: Level entry     Home Layout: One level     Bathroom Shower/Tub: [employer], production Accessibility: Yes How Accessible: Accessible via walker Home Equipment: Cane - single point    Additional Comments: might have some friends who could check on her occasionally. Pt reports she cannot arrange 24/7 assist at home.      Prior Functioning/Environment Level of Independence: Independent with assistive device(s)        Comments: using SPC. Assist with IADLs (carrying groceries, housework). Pt reports driving.     OT Diagnosis: Generalized weakness;Acute pain   OT Problem List: Decreased activity tolerance;Impaired balance (sitting and/or standing);Decreased knowledge of use of DME or AE;Decreased knowledge of precautions;Pain   OT Treatment/Interventions: Self-care/ADL training;Therapeutic exercise;DME and/or AE instruction;Therapeutic activities;Patient/family education;Balance training    OT Goals(Current goals can be found in the care plan section) Acute Rehab OT Goals Patient Stated Goal: be able to go home OT Goal Formulation: With patient Time For Goal Achievement: 09/17/14 Potential to Achieve Goals: Good ADL Goals Pt Will Perform Grooming: with modified independence;with adaptive equipment;sitting;standing Pt Will Perform Lower Body Bathing: with modified independence;with adaptive equipment;sit to/from stand Pt Will Perform Lower Body Dressing: with modified independence;with adaptive equipment;sit to/from stand Pt Will Transfer to Toilet: with modified independence;ambulating;bedside commode Pt Will Perform Toileting - Clothing Manipulation and hygiene: with modified independence;with adaptive equipment;sit to/from stand;sitting/lateral leans Pt Will Perform Tub/Shower Transfer: Tub transfer;ambulating;rolling walker;3 in 1 Additional ADL Goal #1: Pt will complete logrolling at mod I level to prepare for OOB ADLs.   OT Frequency: Min 2X/week   Barriers to D/C: Decreased caregiver support          Co-evaluation              End of Session Equipment Utilized During Treatment: Gait belt;Rolling walker;Back brace  Activity Tolerance: Patient  limited by pain;Other (comment) (weakness, nauses, dizziness) Patient left: in bed;with call bell/phone within reach;with SCD's reapplied   Time: 1247-1315 OT Time Calculation (min): 28 min Charges:  OT General Charges $OT Visit: 1 Procedure OT Evaluation $Initial OT Evaluation Tier I: 1 Procedure OT Treatments $Self Care/Home Management : 8-22 mins G-Codes:    Pilar Grammes 01-Oct-2014, 1:43 PM

## 2014-09-10 NOTE — Clinical Social Work Note (Signed)
Patient currently refusing SNF placement at this time. CSW will follow-up on disposition for patient on 09/11/2014.  Marcelline Deist, [place] - [phone] Clinical Social Work Department Orthopedics [phone]) and Surgical [phone])

## 2014-09-11 LAB — GLUCOSE, CAPILLARY
GLUCOSE-CAPILLARY: 167 mg/dL — AB (ref 65–99)
GLUCOSE-CAPILLARY: 169 mg/dL — AB (ref 65–99)
GLUCOSE-CAPILLARY: 153 mg/dL — AB (ref 65–99)
Glucose-Capillary: 201 mg/dL — ABNORMAL HIGH (ref 65–99)

## 2014-09-11 MED ORDER — MAGNESIUM CITRATE PO SOLN
1.0000 | Freq: Once | ORAL | Status: AC
  Administered 2014-09-11: 1 via ORAL
  Filled 2014-09-11: qty 296

## 2014-09-11 MED ORDER — FLEET ENEMA 7-19 GM/118ML RE ENEM
1.0000 | ENEMA | Freq: Once | RECTAL | Status: AC
  Administered 2014-09-11: 1 via RECTAL
  Filled 2014-09-11: qty 1

## 2014-09-11 NOTE — Progress Notes (Signed)
    Subjective: Procedure(s) (LRB): L4-L5 DECOMPRESSION  (N/A) 2 Days Post-Op  Patient reports pain as 4 on 0-10 scale.   Localized to back. No radicular leg pain.  Increases when moving in bed or ambulating. Reports decreased leg pain reports incisional back pain   Positive void Negative bowel movement Positive flatus Negative chest pain or shortness of breath  Objective: Vital signs in last 24 hours: Temp:  [98.6 F (37 C)] 98.6 F (37 C) (08/25 1951) Pulse Rate:  [102] 102 (08/25 1951) Resp:  [18] 18 (08/25 1951) BP: (115)/(63) 115/63 mmHg (08/25 1951) SpO2:  [98 %] 98 % (08/25 1951)  Intake/Output from previous day: 08/25 0701 - 08/26 0700 In: 240 [P.O.:240] Out: 201 [Urine:201]  Labs:  Recent Labs  09/08/14 1350  WBC 8.7  RBC 3.99  HCT 39.4  PLT 389    Recent Labs  09/08/14 1350  NA 137  K 5.9*  CL 102  CO2 28  BUN 22*  CREATININE 1.01*  GLUCOSE 140*  CALCIUM 9.9   No results for input(s): LABPT, INR in the last 72 hours.  Physical Exam: Neurologically intact ABD soft Intact pulses distally Incision: dressing C/D/I and no drainage Compartment soft No further episodes of incontinence.  Positive spontaneous voiding  Assessment/Plan: Patient stable  xrays n/a Continue mobilization with physical therapy Continue care  Advance diet Up with therapy  Slowly making progress Plan on home d/c hopefully this week-end Continue mobilization with therapy Bowel meds for constipation  Venita Lick, MD [facility] [phone]

## 2014-09-11 NOTE — Progress Notes (Signed)
Physical Therapy Treatment Patient Details Name: Emma [name] MRN: [id number] DOB: 07-05-1945 Today's Date: 09/11/2014    History of Present Illness L4-5 radicular pain s/p decompression    PT Comments    Patient with improved ambulation today, 100 feet with rw. Patient stating that she is planning on returning home even though she does not have any consistent help at home. Will continue skilled PT for progressing mobility for safe transition to home if possible.   Follow Up Recommendations  Home health PT     Equipment Recommendations  Rolling walker with 5" wheels    Recommendations for Other Services       Precautions / Restrictions Precautions Precautions: Back Required Braces or Orthoses: Spinal Brace Spinal Brace: Applied in sitting position (min cues for donning brace) Restrictions Weight Bearing Restrictions: No    Mobility  Bed Mobility               General bed mobility comments: patient found sitting edge of bed  Transfers Overall transfer level: Needs assistance Equipment used: Rolling walker (2 wheeled) Transfers: Sit to/from Stand Sit to Stand: Supervision         General transfer comment: reviewed safe transfer techniques, emphasis on hand placement  Ambulation/Gait Ambulation/Gait assistance: Min guard Ambulation Distance (Feet): 100 Feet Assistive device: Rolling walker (2 wheeled) Gait Pattern/deviations: Step-through pattern;Decreased step length - right;Decreased step length - left Gait velocity: decreased   General Gait Details: no loss of balance with ambulation, slow pattern with rw.    Stairs            Wheelchair Mobility    Modified Rankin (Stroke Patients Only)       Balance Overall balance assessment: Needs assistance Sitting-balance support: No upper extremity supported Sitting balance-Leahy Scale: Good     Standing balance support: Bilateral upper extremity supported Standing balance-Leahy Scale:  Poor Standing balance comment: using rw                    Cognition Arousal/Alertness: Awake/alert Behavior During Therapy: WFL for tasks assessed/performed Overall Cognitive Status: Within Functional Limits for tasks assessed                      Exercises      General Comments General comments (skin integrity, edema, etc.): Patient reports feeling better today. Discussed possibility of SNF before returning home. Patient stating that she will not go anywhere except home.       Pertinent Vitals/Pain Pain Assessment: 0-10 Pain Score: 8  Pain Location: back Pain Intervention(s): Monitored during session    Home Living                      Prior Function            PT Goals (current goals can now be found in the care plan section) Acute Rehab PT Goals Patient Stated Goal: go home PT Goal Formulation: With patient Time For Goal Achievement: 09/24/14 Potential to Achieve Goals: Good Progress towards PT goals: Progressing toward goals    Frequency  Min 4X/week    PT Plan Current plan remains appropriate    Co-evaluation             End of Session Equipment Utilized During Treatment: Gait belt;Back brace Activity Tolerance: Patient tolerated treatment well;No increased pain Patient left: in chair;with call bell/phone within reach     Time: 1127-1146 PT Time Calculation (min) (ACUTE ONLY): 19 min  Charges:  $Gait Training: 8-22 mins                    G Codes:      Christiane Ha, PT, CSCS Pager [phone] Office [phone]  09/11/2014, 11:54 AM

## 2014-09-11 NOTE — Care Management Note (Signed)
Case Management Note  Patient Details  Name: Emma [name] MRN: [id number] Date of Birth: [birth year]  Subjective/Objective:  69 yr old female s/p L4-L5 decompression.                Action/Plan: Case manager spoke with patient concerning home health and DME needs. Patient is progressing slowly with therapy, but not interested in SNF. Wants to go home with Home Health. Choice offered. Referral called to [place], [facility] Liaison. Case manager will continue to monitor.   Expected Discharge Date:    09/13/14              Expected Discharge Plan:   Home with [facility] PT  In-House Referral:  Clinical Social Work  Discharge planning Services  CM Consult  Post Acute Care Choice:  Durable Medical Equipment, Home Health Choice offered to:  Patient  DME Arranged:  3-N-1, Walker rolling DME Agency:  [facility]  HH Arranged:  PT HH Agency:  [facility]  Status of Service:  Completed, signed off  Medicare Important Message Given:    Date Medicare IM Given:    Medicare IM give by:    Date Additional Medicare IM Given:    Additional Medicare Important Message give by:     If discussed at Long Length of Stay Meetings, dates discussed:    Additional Comments:  Durenda Guthrie, RN 09/11/2014, 11:48 AM

## 2014-09-11 NOTE — Progress Notes (Signed)
Occupational Therapy Treatment Patient Details Name: Emma [name] MRN: [id number] DOB: [birth year] Today's Date: 09/11/2014    History of present illness L4-5 radicular pain s/p decompression   OT comments  Patient making progress towards OT goals. Pt overall supervision and requires cues for safety and adherence to back precautions.   Follow Up Recommendations  Home health OT;Supervision - Intermittent    Equipment Recommendations  3 in 1 bedside comode;Other (comment) (reacher and LH sponge)    Recommendations for Other Services  None at this time   Precautions / Restrictions Precautions Precautions: Back Precaution Comments: educated on BAT precautions  Required Braces or Orthoses: Spinal Brace Spinal Brace: Applied in sitting position Restrictions Weight Bearing Restrictions: No    Mobility Bed Mobility Overal bed mobility: Needs Assistance Bed Mobility: Rolling;Sidelying to Sit;Sit to Sidelying Rolling: Supervision Sidelying to sit: Supervision     Sit to sidelying: Supervision General bed mobility comments: use of bed rails, increased time, min cueing to adhere to back precautions   Transfers Overall transfer level: Needs assistance Equipment used: Rolling walker (2 wheeled) Transfers: Sit to/from Stand Sit to Stand: Supervision         General transfer comment: reviewed safe transfer techniques, emphasis on hand placement and back precautions    Balance Overall balance assessment: Needs assistance Sitting-balance support: No upper extremity supported;Feet supported Sitting balance-Leahy Scale: Good     Standing balance support: Bilateral upper extremity supported;During functional activity Standing balance-Leahy Scale: Fair Standing balance comment: using rw   ADL Overall ADL's : Needs assistance/impaired General ADL Comments: Pt states sitting on her bench at home she will be able to cross BLEs for LB ADLs. Reiterated importance of back  precautions (no bending, no arching, no twisting). Pt engaged in bed mobility, stood using RW from EOB, and ambulated int BR. Pt transferred onto 3-in-1 seated over toilet seat, encouraged pt to do this at home. Pt reports she normally takes baths in tub. Encouraged pt to take sponge baths for now until her back heals and her mobility imroves, pt verbalized understanding of this.      Cognition   Behavior During Therapy: WFL for tasks assessed/performed Overall Cognitive Status: Within Functional Limits for tasks assessed                 Pertinent Vitals/ Pain       Pain Assessment: Faces Pain Score: 8  Faces Pain Scale: Hurts even more Pain Location: back Pain Descriptors / Indicators: Sore;Tender;Aching Pain Intervention(s): Monitored during session;Repositioned   Frequency Min 2X/week     Progress Toward Goals  OT Goals(current goals can now befound in the care plan section)  Progress towards OT goals: Progressing toward goals  Acute Rehab OT Goals Patient Stated Goal: go home  Plan Discharge plan needs to be updated    End of Session Equipment Utilized During Treatment: Rolling walker;Back brace   Activity Tolerance Patient tolerated treatment well   Patient Left in bed;with call bell/phone within reach    Time: 1422-1433 OT Time Calculation (min): 11 min  Charges: OT General Charges $OT Visit: 1 Procedure OT Treatments $Self Care/Home Management : 8-22 mins  Torrence Branagan , MS, OTR/L, CLT Pager: [phone]  09/11/2014, 2:45 PM

## 2014-09-12 LAB — GLUCOSE, CAPILLARY
GLUCOSE-CAPILLARY: 160 mg/dL — AB (ref 65–99)
Glucose-Capillary: 166 mg/dL — ABNORMAL HIGH (ref 65–99)

## 2014-09-12 NOTE — Progress Notes (Signed)
    Subjective: Procedure(s) (LRB): L4-L5 DECOMPRESSION  (N/A) 3 Days Post-Op  Patient reports pain as 4 on 0-10 scale.   Localized to back. No radicular leg pain.  Increases when moving in bed or ambulating. Reports decreased leg pain reports incisional back pain   Positive void Negative bowel movement Positive flatus Negative chest pain or shortness of breath  Objective: Vital signs in last 24 hours: Temp:  [99.5 F (37.5 C)-100.2 F (37.9 C)] 100.2 F (37.9 C) (08/27 0523) Pulse Rate:  [73-113] 113 (08/27 0523) Resp:  [17-18] 18 (08/27 0523) BP: (101-123)/(46-70) 123/70 mmHg (08/27 0523) SpO2:  [90 %-97 %] 90 % (08/27 0523)  Intake/Output from previous day: 08/26 0701 - 08/27 0700 In: 720 [P.O.:720] Out: -   Labs: No results for input(s): WBC, RBC, HCT, PLT in the last 72 hours. No results for input(s): NA, K, CL, CO2, BUN, CREATININE, GLUCOSE, CALCIUM in the last 72 hours. No results for input(s): LABPT, INR in the last 72 hours.  Physical Exam: Neurologically intact ABD soft Intact pulses distally Incision: incis c/d/i. drsg changed. Compartment soft  Assessment/Plan: Patient stable  Continue mobilization with physical therapy Continue care  Advance diet Up with therapy  Continue mobilization with therapy Bowel meds for constipation D/C home after clears PT/OT

## 2014-09-12 NOTE — Progress Notes (Signed)
Physical Therapy Treatment Patient Details Name: Janny Crute MRN: [id number] DOB: 02/25/1945 Today's Date: 09/12/2014    History of Present Illness L4-5 radicular pain s/p decompression    PT Comments    Progressing steadily. Emphasis on education and progressive ambulation  Follow Up Recommendations  Home health PT     Equipment Recommendations  Rolling walker with 5" wheels    Recommendations for Other Services OT consult     Precautions / Restrictions Precautions Precautions: Back Precaution Comments: educated on BAT precautions  Required Braces or Orthoses: Spinal Brace Spinal Brace: Applied in sitting position Restrictions Weight Bearing Restrictions: No    Mobility  Bed Mobility               General bed mobility comments: discussed, not practiced  Transfers Overall transfer level: Needs assistance Equipment used: Rolling walker (2 wheeled) Transfers: Sit to/from Stand Sit to Stand: Supervision         General transfer comment: used safe transfer technique  Ambulation/Gait Ambulation/Gait assistance: Supervision Ambulation Distance (Feet): 180 Feet Assistive device: Rolling walker (2 wheeled) Gait Pattern/deviations: Step-through pattern Gait velocity: decreased Gait velocity interpretation: Below normal speed for age/gender General Gait Details: steady with RW   Stairs            Wheelchair Mobility    Modified Rankin (Stroke Patients Only)       Balance Overall balance assessment: No apparent balance deficits (not formally assessed)           Standing balance-Leahy Scale: Fair                      Cognition Arousal/Alertness: Awake/alert Behavior During Therapy: WFL for tasks assessed/performed Overall Cognitive Status: Within Functional Limits for tasks assessed                      Exercises      General Comments General comments (skin integrity, edema, etc.): Advised pt of back  care/prec, logroll, bracing, lifting restrictions and typical progression of activity.      Pertinent Vitals/Pain Pain Assessment: Faces Faces Pain Scale: Hurts little more Pain Location: back Pain Descriptors / Indicators: Aching Pain Intervention(s): Monitored during session    Home Living                      Prior Function            PT Goals (current goals can now be found in the care plan section) Acute Rehab PT Goals Patient Stated Goal: go home PT Goal Formulation: With patient Time For Goal Achievement: 09/24/14 Potential to Achieve Goals: Good Progress towards PT goals: Progressing toward goals    Frequency  Min 4X/week    PT Plan Current plan remains appropriate    Co-evaluation             End of Session Equipment Utilized During Treatment: Back brace Activity Tolerance: Patient tolerated treatment well;No increased pain Patient left: in chair;with call bell/phone within reach     Time: 1230-1246 PT Time Calculation (min) (ACUTE ONLY): 16 min  Charges:  $Gait Training: 8-22 mins                    G Codes:      Onofre Gains, Eliseo Gum 09/12/2014, 12:53 PM 09/12/2014  Tar Heel Bing, PT [phone]  (pager)

## 2014-09-12 NOTE — Progress Notes (Signed)
Patient education provided with d/c planning printout sheets - directed toward patient and her family member. All questions answered, Rx's reviewed, notations made to allow patient / family understand when she can take medications at home to control pain, nausea and muscle spasms [Rx's to be filled at her pharmacy] - instructions given to patient for reference. Additionally, printout per Dr. Linna Caprice with the 3 prescriptions stapled together given to patient intact. Personal belongings and DME equipment [3-N-1, r/w] placed on a cart and patient taken by w/c per nursing tech accompanied by her family member to [employer] exit - assisted to enter car and drive home.

## 2014-09-12 NOTE — Progress Notes (Signed)
Accidentally kicked incentive spirometer that was on the floor. Patient given new one as requested.

## 2014-09-12 NOTE — Progress Notes (Signed)
Assisted nursing student pull routine10:00 medications and PRN Oxy IR 10 mg for pain management. Student will administer Rx's with her instructors guidance.

## 2014-09-24 NOTE — Discharge Summary (Addendum)
Physician Discharge Summary  Patient ID: Emma [name] MRN: [id number] DOB/AGE: 1945/12/24 69 y.o.  Admit date: 09/09/2014 Discharge date: 09/24/2014  Admission Diagnoses:  <principal problem not specified>  Discharge Diagnoses:  Active Problems:   Back pain   Past Medical History  Diagnosis Date  . Hypertension   . Pneumonia   . Hypercholesteremia   . Diabetic neuropathy   . Hypothyroidism   . Diabetes mellitus     Type II  . Constipation   . Arthritis     Back and right shoulder  . History of blood transfusion     Surgeries: Procedure(s): L4-L5 DECOMPRESSION  on 09/09/2014   Consultants (if any):    Discharged Condition: Improved  Hospital Course: Emma [name] is an 69 y.o. female who was admitted 09/09/2014 with a diagnosis of LBP and radicular leg pain and went to the operating room on 09/09/2014 and underwent the above named procedures.  The pt was discharged on 09/12/14 to home.  PT was utilized while pt was in the hospital. Pain was well controlled.  Leg pain was reduced post op.    She was given perioperative antibiotics:  Anti-infectives    Start     Dose/Rate Route Frequency Ordered Stop   09/09/14 2030  ceFAZolin (ANCEF) IVPB 1 g/50 mL premix     1 g 100 mL/hr over 30 Minutes Intravenous Every 8 hours 09/09/14 1712 09/10/14 0546   09/09/14 1130  ceFAZolin (ANCEF) IVPB 2 g/50 mL premix     2 g 100 mL/hr over 30 Minutes Intravenous To [facility] 09/08/14 1242 09/09/14 1243    .  She was given sequential compression devices, early ambulation, and TED hose for DVT prophylaxis.  She benefited maximally from the hospital stay and there were no complications.    Recent vital signs:  Filed Vitals:   09/12/14 0915  BP: 101/57  Pulse: 107  Temp: 99.6 F (37.6 C)  Resp:     Recent laboratory studies:  Lab Results  Component Value Date   HGB 12.6 09/08/2014   HGB 12.6 11/25/2011   HGB 12.9 08/02/2010   Lab Results  Component Value  Date   WBC 8.7 09/08/2014   PLT 389 09/08/2014   No results found for: INR Lab Results  Component Value Date   NA 137 09/08/2014   K 5.9* 09/08/2014   CL 102 09/08/2014   CO2 28 09/08/2014   BUN 22* 09/08/2014   CREATININE 1.01* 09/08/2014   GLUCOSE 140* 09/08/2014    Discharge Medications:     Medication List    STOP taking these medications        acetaminophen 650 MG CR tablet  Commonly known as:  TYLENOL     aspirin 81 MG tablet     gabapentin 600 MG tablet  Commonly known as:  NEURONTIN     HYDROcodone-acetaminophen 5-325 MG per tablet  Commonly known as:  NORCO/VICODIN     OVER THE COUNTER MEDICATION      TAKE these medications        glimepiride 4 MG tablet  Commonly known as:  AMARYL  Take 4 mg by mouth daily.     levothyroxine 100 MCG tablet  Commonly known as:  SYNTHROID, LEVOTHROID  Take 100 mcg by mouth daily.     lisinopril 20 MG tablet  Commonly known as:  PRINIVIL,ZESTRIL  Take 20 mg by mouth daily.     metFORMIN 500 MG (MOD) 24 hr tablet  Commonly known as:  GLUMETZA  Take 500 mg by mouth daily with breakfast.     methocarbamol 500 MG tablet  Commonly known as:  ROBAXIN  Take 1 tablet (500 mg total) by mouth 3 (three) times daily as needed for muscle spasms.     MULTIVITAMIN & MINERAL PO  Take 1 tablet by mouth daily.     Omega 3 1200 MG Caps  Take 1,200 mg by mouth 2 (two) times daily.     ondansetron 4 MG tablet  Commonly known as:  ZOFRAN  Take 1 tablet (4 mg total) by mouth every 8 (eight) hours as needed for nausea or vomiting.     Oxcarbazepine 300 MG tablet  Commonly known as:  TRILEPTAL  Take 300 mg by mouth daily.     oxyCODONE-acetaminophen 10-325 MG per tablet  Commonly known as:  PERCOCET  Take 1 tablet by mouth every 4 (four) hours as needed for pain.        Diagnostic Studies: Dg Lumbar Spine 2-3 Views  09/09/2014   CLINICAL DATA:  L4-5 decompression.  Evaluate instrument placement.  EXAM: LUMBAR SPINE - 2-3  VIEW  COMPARISON:  08/02/2010  FINDINGS: Lumbar spine levels have been labeled on the film #2 as requested. On the first image there are surgical probes posterior to the L4 and L5 vertebra. On the second film there are tissue spreaders posterior to the L4 spinous process. The surgical probe is posterior to the L4-5 disc space.  IMPRESSION: 1. The second film has been annotated as requested. 2. On the second film there is a surgical probe localizing the L4-5 disc space.   Electronically Signed   By: Signa Kell M.D.   On: 09/09/2014 13:33    Disposition: 01-Home or Self Care      Discharge Instructions    Call MD / Call 911    Complete by:  As directed   If you experience chest pain or shortness of breath, CALL 911 and be transported to the hospital emergency room.  If you develope a fever above 101 F, pus (white drainage) or increased drainage or redness at the wound, or calf pain, call your surgeon's office.     Constipation Prevention    Complete by:  As directed   Drink plenty of fluids.  Prune juice may be helpful.  You may use a stool softener, such as Colace (over the counter) 100 mg twice a day.  Use MiraLax (over the counter) for constipation as needed.     Diet - low sodium heart healthy    Complete by:  As directed      Driving restrictions    Complete by:  As directed   No driving for 6 weeks     Increase activity slowly as tolerated    Complete by:  As directed      Lifting restrictions    Complete by:  As directed   No lifting for 6 weeks           Follow-up Information    Follow up with Alvy Beal, MD. Schedule an appointment as soon as possible for a visit in 2 weeks.   Specialty:  Orthopedic Surgery   Why:  For suture removal, For wound re-check, If symptoms worsen   Contact information:   [address] [place] [address] [phone]       Follow up with [facility].   Why:  Someone from [facility] will  contact you concerning  start date and time for therapy.   Contact information:   [address] [place] [address] [phone]        Signed: Kirt Boys 09/24/2014, 10:53 AM

## 2017-11-19 DIAGNOSIS — E1165 Type 2 diabetes mellitus with hyperglycemia: Secondary | ICD-10-CM | POA: Diagnosis not present

## 2017-11-19 DIAGNOSIS — E669 Obesity, unspecified: Secondary | ICD-10-CM | POA: Diagnosis not present

## 2017-11-19 DIAGNOSIS — E1142 Type 2 diabetes mellitus with diabetic polyneuropathy: Secondary | ICD-10-CM | POA: Diagnosis not present

## 2017-11-19 DIAGNOSIS — Z720 Tobacco use: Secondary | ICD-10-CM | POA: Diagnosis not present

## 2017-11-19 DIAGNOSIS — E031 Congenital hypothyroidism without goiter: Secondary | ICD-10-CM | POA: Diagnosis not present

## 2017-11-19 DIAGNOSIS — Z789 Other specified health status: Secondary | ICD-10-CM | POA: Diagnosis not present

## 2017-11-19 DIAGNOSIS — E7800 Pure hypercholesterolemia, unspecified: Secondary | ICD-10-CM | POA: Diagnosis not present

## 2017-11-19 DIAGNOSIS — I10 Essential (primary) hypertension: Secondary | ICD-10-CM | POA: Diagnosis not present

## 2017-11-30 DIAGNOSIS — I10 Essential (primary) hypertension: Secondary | ICD-10-CM | POA: Diagnosis not present

## 2017-12-17 DIAGNOSIS — E031 Congenital hypothyroidism without goiter: Secondary | ICD-10-CM | POA: Diagnosis not present

## 2017-12-17 DIAGNOSIS — E1142 Type 2 diabetes mellitus with diabetic polyneuropathy: Secondary | ICD-10-CM | POA: Diagnosis not present

## 2017-12-17 DIAGNOSIS — E7800 Pure hypercholesterolemia, unspecified: Secondary | ICD-10-CM | POA: Diagnosis not present

## 2017-12-17 DIAGNOSIS — E669 Obesity, unspecified: Secondary | ICD-10-CM | POA: Diagnosis not present

## 2017-12-17 DIAGNOSIS — Z720 Tobacco use: Secondary | ICD-10-CM | POA: Diagnosis not present

## 2017-12-17 DIAGNOSIS — E1165 Type 2 diabetes mellitus with hyperglycemia: Secondary | ICD-10-CM | POA: Diagnosis not present

## 2017-12-17 DIAGNOSIS — I10 Essential (primary) hypertension: Secondary | ICD-10-CM | POA: Diagnosis not present

## 2017-12-31 DIAGNOSIS — Z720 Tobacco use: Secondary | ICD-10-CM | POA: Diagnosis not present

## 2017-12-31 DIAGNOSIS — E1142 Type 2 diabetes mellitus with diabetic polyneuropathy: Secondary | ICD-10-CM | POA: Diagnosis not present

## 2017-12-31 DIAGNOSIS — E031 Congenital hypothyroidism without goiter: Secondary | ICD-10-CM | POA: Diagnosis not present

## 2017-12-31 DIAGNOSIS — E1165 Type 2 diabetes mellitus with hyperglycemia: Secondary | ICD-10-CM | POA: Diagnosis not present

## 2017-12-31 DIAGNOSIS — E669 Obesity, unspecified: Secondary | ICD-10-CM | POA: Diagnosis not present

## 2017-12-31 DIAGNOSIS — I10 Essential (primary) hypertension: Secondary | ICD-10-CM | POA: Diagnosis not present

## 2017-12-31 DIAGNOSIS — E7800 Pure hypercholesterolemia, unspecified: Secondary | ICD-10-CM | POA: Diagnosis not present

## 2018-01-28 DIAGNOSIS — E119 Type 2 diabetes mellitus without complications: Secondary | ICD-10-CM | POA: Diagnosis not present

## 2018-01-28 DIAGNOSIS — Z961 Presence of intraocular lens: Secondary | ICD-10-CM | POA: Diagnosis not present

## 2018-03-21 DIAGNOSIS — E1142 Type 2 diabetes mellitus with diabetic polyneuropathy: Secondary | ICD-10-CM | POA: Diagnosis not present

## 2018-03-21 DIAGNOSIS — I10 Essential (primary) hypertension: Secondary | ICD-10-CM | POA: Diagnosis not present

## 2018-03-21 DIAGNOSIS — E7800 Pure hypercholesterolemia, unspecified: Secondary | ICD-10-CM | POA: Diagnosis not present

## 2018-03-21 DIAGNOSIS — E669 Obesity, unspecified: Secondary | ICD-10-CM | POA: Diagnosis not present

## 2018-03-21 DIAGNOSIS — Z720 Tobacco use: Secondary | ICD-10-CM | POA: Diagnosis not present

## 2018-03-21 DIAGNOSIS — E1165 Type 2 diabetes mellitus with hyperglycemia: Secondary | ICD-10-CM | POA: Diagnosis not present

## 2018-03-21 DIAGNOSIS — E039 Hypothyroidism, unspecified: Secondary | ICD-10-CM | POA: Diagnosis not present

## 2018-03-21 DIAGNOSIS — M79604 Pain in right leg: Secondary | ICD-10-CM | POA: Diagnosis not present

## 2018-03-21 DIAGNOSIS — M79661 Pain in right lower leg: Secondary | ICD-10-CM | POA: Diagnosis not present

## 2018-03-22 DIAGNOSIS — I70213 Atherosclerosis of native arteries of extremities with intermittent claudication, bilateral legs: Secondary | ICD-10-CM | POA: Diagnosis not present

## 2018-03-23 ENCOUNTER — Encounter (HOSPITAL_BASED_OUTPATIENT_CLINIC_OR_DEPARTMENT_OTHER): Payer: Self-pay | Admitting: *Deleted

## 2018-03-23 ENCOUNTER — Other Ambulatory Visit: Payer: Self-pay

## 2018-03-23 ENCOUNTER — Inpatient Hospital Stay (HOSPITAL_BASED_OUTPATIENT_CLINIC_OR_DEPARTMENT_OTHER)
Admission: EM | Admit: 2018-03-23 | Discharge: 2018-04-04 | DRG: 240 | Disposition: A | Discharge status: Skilled Nursing Facility | Payer: Medicare HMO | Attending: Vascular Surgery | Admitting: Vascular Surgery

## 2018-03-23 ENCOUNTER — Emergency Department (HOSPITAL_BASED_OUTPATIENT_CLINIC_OR_DEPARTMENT_OTHER): Payer: Medicare HMO

## 2018-03-23 DIAGNOSIS — Z7984 Long term (current) use of oral hypoglycemic drugs: Secondary | ICD-10-CM | POA: Diagnosis not present

## 2018-03-23 DIAGNOSIS — R279 Unspecified lack of coordination: Secondary | ICD-10-CM | POA: Diagnosis not present

## 2018-03-23 DIAGNOSIS — I739 Peripheral vascular disease, unspecified: Secondary | ICD-10-CM | POA: Diagnosis present

## 2018-03-23 DIAGNOSIS — Z7982 Long term (current) use of aspirin: Secondary | ICD-10-CM

## 2018-03-23 DIAGNOSIS — E1151 Type 2 diabetes mellitus with diabetic peripheral angiopathy without gangrene: Secondary | ICD-10-CM | POA: Diagnosis not present

## 2018-03-23 DIAGNOSIS — T82868A Thrombosis of vascular prosthetic devices, implants and grafts, initial encounter: Secondary | ICD-10-CM | POA: Diagnosis not present

## 2018-03-23 DIAGNOSIS — F17210 Nicotine dependence, cigarettes, uncomplicated: Secondary | ICD-10-CM | POA: Diagnosis not present

## 2018-03-23 DIAGNOSIS — Z5189 Encounter for other specified aftercare: Secondary | ICD-10-CM | POA: Diagnosis not present

## 2018-03-23 DIAGNOSIS — G546 Phantom limb syndrome with pain: Secondary | ICD-10-CM | POA: Diagnosis not present

## 2018-03-23 DIAGNOSIS — D62 Acute posthemorrhagic anemia: Secondary | ICD-10-CM | POA: Diagnosis not present

## 2018-03-23 DIAGNOSIS — I771 Stricture of artery: Secondary | ICD-10-CM | POA: Diagnosis not present

## 2018-03-23 DIAGNOSIS — Z951 Presence of aortocoronary bypass graft: Secondary | ICD-10-CM | POA: Diagnosis not present

## 2018-03-23 DIAGNOSIS — I70221 Atherosclerosis of native arteries of extremities with rest pain, right leg: Secondary | ICD-10-CM | POA: Diagnosis present

## 2018-03-23 DIAGNOSIS — Z743 Need for continuous supervision: Secondary | ICD-10-CM | POA: Diagnosis not present

## 2018-03-23 DIAGNOSIS — R52 Pain, unspecified: Secondary | ICD-10-CM | POA: Diagnosis not present

## 2018-03-23 DIAGNOSIS — I998 Other disorder of circulatory system: Secondary | ICD-10-CM | POA: Diagnosis present

## 2018-03-23 DIAGNOSIS — M62261 Nontraumatic ischemic infarction of muscle, right lower leg: Secondary | ICD-10-CM | POA: Diagnosis not present

## 2018-03-23 DIAGNOSIS — Z419 Encounter for procedure for purposes other than remedying health state, unspecified: Secondary | ICD-10-CM

## 2018-03-23 DIAGNOSIS — Y832 Surgical operation with anastomosis, bypass or graft as the cause of abnormal reaction of the patient, or of later complication, without mention of misadventure at the time of the procedure: Secondary | ICD-10-CM | POA: Diagnosis not present

## 2018-03-23 DIAGNOSIS — Z90710 Acquired absence of both cervix and uterus: Secondary | ICD-10-CM | POA: Diagnosis not present

## 2018-03-23 DIAGNOSIS — Z89611 Acquired absence of right leg above knee: Secondary | ICD-10-CM | POA: Diagnosis not present

## 2018-03-23 DIAGNOSIS — I700 Atherosclerosis of aorta: Secondary | ICD-10-CM | POA: Diagnosis not present

## 2018-03-23 DIAGNOSIS — E039 Hypothyroidism, unspecified: Secondary | ICD-10-CM | POA: Diagnosis not present

## 2018-03-23 DIAGNOSIS — I745 Embolism and thrombosis of iliac artery: Secondary | ICD-10-CM | POA: Diagnosis not present

## 2018-03-23 DIAGNOSIS — T82856A Stenosis of peripheral vascular stent, initial encounter: Secondary | ICD-10-CM | POA: Diagnosis not present

## 2018-03-23 DIAGNOSIS — I10 Essential (primary) hypertension: Secondary | ICD-10-CM | POA: Diagnosis present

## 2018-03-23 DIAGNOSIS — R000 Tachycardia, unspecified: Secondary | ICD-10-CM | POA: Diagnosis not present

## 2018-03-23 DIAGNOSIS — I70291 Other atherosclerosis of native arteries of extremities, right leg: Secondary | ICD-10-CM | POA: Diagnosis not present

## 2018-03-23 DIAGNOSIS — E119 Type 2 diabetes mellitus without complications: Secondary | ICD-10-CM | POA: Diagnosis not present

## 2018-03-23 HISTORY — DX: Disorder of thyroid, unspecified: E07.9

## 2018-03-23 HISTORY — DX: Type 2 diabetes mellitus without complications: E11.9

## 2018-03-23 LAB — CBC
HCT: 35.9 % — ABNORMAL LOW (ref 36.0–46.0)
HCT: 35.1 % — ABNORMAL LOW (ref 36.0–46.0)
Hemoglobin: 11.3 g/dL — ABNORMAL LOW (ref 12.0–15.0)
Hemoglobin: 10.9 g/dL — ABNORMAL LOW (ref 12.0–15.0)
MCH: 30.5 pg (ref 26.0–34.0)
MCH: 30 pg (ref 26.0–34.0)
MCHC: 31.5 g/dL (ref 30.0–36.0)
MCHC: 31.1 g/dL (ref 30.0–36.0)
MCV: 97 fL (ref 80.0–100.0)
MCV: 96.7 fL (ref 80.0–100.0)
PLATELETS: 285 10*3/uL (ref 150–400)
Platelets: 319 10*3/uL (ref 150–400)
RBC: 3.7 MIL/uL — ABNORMAL LOW (ref 3.87–5.11)
RBC: 3.63 MIL/uL — ABNORMAL LOW (ref 3.87–5.11)
RDW: 12.5 % (ref 11.5–15.5)
RDW: 12.3 % (ref 11.5–15.5)
WBC: 8.6 10*3/uL (ref 4.0–10.5)
WBC: 9.6 10*3/uL (ref 4.0–10.5)
nRBC: 0 % (ref 0.0–0.2)
nRBC: 0 % (ref 0.0–0.2)

## 2018-03-23 LAB — BASIC METABOLIC PANEL
Anion gap: 9 (ref 5–15)
BUN: 22 mg/dL (ref 8–23)
CO2: 26 mmol/L (ref 22–32)
CREATININE: 1.02 mg/dL — AB (ref 0.44–1.00)
Calcium: 8.7 mg/dL — ABNORMAL LOW (ref 8.9–10.3)
Chloride: 101 mmol/L (ref 98–111)
GFR calc Af Amer: >60 mL/min (ref >60)
GFR, EST NON AFRICAN AMERICAN: 56 mL/min — AB (ref >60)
Glucose, Bld: 229 mg/dL — ABNORMAL HIGH (ref 70–99)
Potassium: 4.3 mmol/L (ref 3.5–5.1)
Sodium: 136 mmol/L (ref 135–145)

## 2018-03-23 LAB — GLUCOSE, CAPILLARY: Glucose-Capillary: 271 mg/dL — ABNORMAL HIGH (ref 70–99)

## 2018-03-23 MED ORDER — IOPAMIDOL (ISOVUE-370) INJECTION 76%
100.0000 mL | Freq: Once | INTRAVENOUS | Status: AC | PRN
  Administered 2018-03-23: 100 mL via INTRAVENOUS

## 2018-03-23 MED ORDER — ATORVASTATIN CALCIUM 10 MG PO TABS
10.0000 mg | ORAL_TABLET | Freq: Every day | ORAL | Status: DC
  Administered 2018-03-24 – 2018-04-04 (×10): 10 mg via ORAL
  Filled 2018-03-23 (×10): qty 1

## 2018-03-23 MED ORDER — GUAIFENESIN-DM 100-10 MG/5ML PO SYRP: 15.0000 mL | ORAL_SOLUTION | ORAL | Status: DC | PRN

## 2018-03-23 MED ORDER — ONDANSETRON HCL 4 MG/2ML IJ SOLN: 4.0000 mg | Freq: Four times a day (QID) | INTRAMUSCULAR | Status: DC | PRN

## 2018-03-23 MED ORDER — MORPHINE SULFATE (PF) 4 MG/ML IV SOLN
4.0000 mg | Freq: Once | INTRAVENOUS | Status: AC
  Administered 2018-03-23: 4 mg via INTRAVENOUS
  Filled 2018-03-23: qty 1

## 2018-03-23 MED ORDER — HEPARIN BOLUS VIA INFUSION
4000.0000 [IU] | Freq: Once | INTRAVENOUS | Status: AC
  Administered 2018-03-23: 4000 [IU] via INTRAVENOUS

## 2018-03-23 MED ORDER — GABAPENTIN 600 MG PO TABS
1200.0000 mg | ORAL_TABLET | Freq: Once | ORAL | Status: AC
  Administered 2018-03-23: 1200 mg via ORAL
  Filled 2018-03-23: qty 2

## 2018-03-23 MED ORDER — ALUM & MAG HYDROXIDE-SIMETH 200-200-20 MG/5ML PO SUSP: 15.0000 mL | ORAL | Status: DC | PRN

## 2018-03-23 MED ORDER — GABAPENTIN 600 MG PO TABS
1200.0000 mg | ORAL_TABLET | Freq: Two times a day (BID) | ORAL | Status: DC
  Administered 2018-03-24 – 2018-03-29 (×9): 1200 mg via ORAL
  Filled 2018-03-23 (×10): qty 2

## 2018-03-23 MED ORDER — ACETAMINOPHEN 325 MG PO TABS: 325.0000 mg | ORAL_TABLET | ORAL | Status: DC | PRN

## 2018-03-23 MED ORDER — OXYCODONE HCL 5 MG PO TABS
5.0000 mg | ORAL_TABLET | ORAL | Status: DC | PRN
  Administered 2018-03-24 – 2018-03-25 (×3): 10 mg via ORAL
  Filled 2018-03-23 (×3): qty 2

## 2018-03-23 MED ORDER — ADULT MULTIVITAMIN W/MINERALS CH
2.0000 | ORAL_TABLET | Freq: Every day | ORAL | Status: DC
  Administered 2018-03-24 – 2018-04-04 (×8): 2 via ORAL
  Filled 2018-03-23 (×8): qty 2

## 2018-03-23 MED ORDER — SODIUM CHLORIDE 0.9 % IV SOLN: INTRAVENOUS | Status: DC

## 2018-03-23 MED ORDER — DOCUSATE SODIUM 100 MG PO CAPS
100.0000 mg | ORAL_CAPSULE | Freq: Every day | ORAL | Status: DC
  Filled 2018-03-23 (×3): qty 1

## 2018-03-23 MED ORDER — PANTOPRAZOLE SODIUM 40 MG PO TBEC
40.0000 mg | DELAYED_RELEASE_TABLET | Freq: Every day | ORAL | Status: DC
  Administered 2018-03-24 – 2018-03-25 (×2): 40 mg via ORAL
  Filled 2018-03-23 (×3): qty 1

## 2018-03-23 MED ORDER — POTASSIUM CHLORIDE CRYS ER 20 MEQ PO TBCR: 20.0000 meq | EXTENDED_RELEASE_TABLET | Freq: Every day | ORAL | Status: DC | PRN

## 2018-03-23 MED ORDER — INSULIN ASPART 100 UNIT/ML ~~LOC~~ SOLN
0.0000 [IU] | Freq: Three times a day (TID) | SUBCUTANEOUS | Status: DC
  Administered 2018-03-24: 5 [IU] via SUBCUTANEOUS
  Administered 2018-03-24: 8 [IU] via SUBCUTANEOUS
  Administered 2018-03-25: 5 [IU] via SUBCUTANEOUS
  Administered 2018-03-25: 2 [IU] via SUBCUTANEOUS
  Administered 2018-03-25 – 2018-03-26 (×2): 5 [IU] via SUBCUTANEOUS
  Administered 2018-03-26: 7 [IU] via SUBCUTANEOUS
  Administered 2018-03-27 – 2018-03-28 (×4): 5 [IU] via SUBCUTANEOUS
  Administered 2018-03-28: 3 [IU] via SUBCUTANEOUS
  Administered 2018-03-29: 5 [IU] via SUBCUTANEOUS
  Administered 2018-03-29: 2 [IU] via SUBCUTANEOUS
  Administered 2018-03-30: 3 [IU] via SUBCUTANEOUS
  Administered 2018-03-30: 15 [IU] via SUBCUTANEOUS
  Administered 2018-03-31 – 2018-04-01 (×3): 5 [IU] via SUBCUTANEOUS
  Administered 2018-04-01: 3 [IU] via SUBCUTANEOUS
  Administered 2018-04-01: 5 [IU] via SUBCUTANEOUS
  Administered 2018-04-02: 3 [IU] via SUBCUTANEOUS
  Administered 2018-04-02: 5 [IU] via SUBCUTANEOUS
  Administered 2018-04-03: 11 [IU] via SUBCUTANEOUS
  Administered 2018-04-03: 8 [IU] via SUBCUTANEOUS
  Administered 2018-04-03: 1 [IU] via SUBCUTANEOUS
  Administered 2018-04-04: 2 [IU] via SUBCUTANEOUS
  Administered 2018-04-04: 5 [IU] via SUBCUTANEOUS
  Administered 2018-04-04: 8 [IU] via SUBCUTANEOUS

## 2018-03-23 MED ORDER — TETRAHYDROZOLINE HCL 0.05 % OP SOLN
1.0000 [drp] | Freq: Every day | OPHTHALMIC | Status: DC | PRN
  Filled 2018-03-23: qty 15

## 2018-03-23 MED ORDER — HEPARIN (PORCINE) 25000 UT/250ML-% IV SOLN
1050.0000 [IU]/h | INTRAVENOUS | Status: DC
  Administered 2018-03-23: 1100 [IU]/h via INTRAVENOUS
  Administered 2018-03-24: 1200 [IU]/h via INTRAVENOUS
  Filled 2018-03-23 (×2): qty 250

## 2018-03-23 MED ORDER — ACETAMINOPHEN 325 MG RE SUPP: 325.0000 mg | RECTAL | Status: DC | PRN

## 2018-03-23 MED ORDER — METFORMIN HCL 500 MG PO TABS
1000.0000 mg | ORAL_TABLET | Freq: Once | ORAL | Status: AC
  Administered 2018-03-23: 1000 mg via ORAL
  Filled 2018-03-23: qty 2

## 2018-03-23 MED ORDER — MAGNESIUM SULFATE 2 GM/50ML IV SOLN: 2.0000 g | Freq: Every day | INTRAVENOUS | Status: DC | PRN

## 2018-03-23 MED ORDER — LEVOTHYROXINE SODIUM 100 MCG PO TABS
100.0000 ug | ORAL_TABLET | Freq: Every day | ORAL | Status: DC
  Administered 2018-03-24 – 2018-04-04 (×11): 100 ug via ORAL
  Filled 2018-03-23 (×11): qty 1

## 2018-03-23 MED ORDER — LABETALOL HCL 5 MG/ML IV SOLN: 10.0000 mg | INTRAVENOUS | Status: DC | PRN

## 2018-03-23 MED ORDER — HYDRALAZINE HCL 20 MG/ML IJ SOLN: 5.0000 mg | INTRAMUSCULAR | Status: DC | PRN

## 2018-03-23 NOTE — ED Notes (Signed)
Unable to assess pain at this time. Pharmacy at bedside, confirming home meds.

## 2018-03-23 NOTE — ED Triage Notes (Signed)
[facility]- pt brought in from [facility] with right foot pain since Monday. On Wednesday pt went to PCP and ruled out a DVT. Currently there are no pedal pulse in the right foot but there is a popliteal pulse in the right leg. Right foot is cold with no discoloration. Pt received a heparin bolus of 4000 units and continuous heparin at 1100/hr. Pt also received morphine for pain and completed a CT angio.    VSS per [facility]

## 2018-03-23 NOTE — ED Notes (Signed)
[facility] arrived to transport pt to [facility]- CT to be obtained prior to departure per Dr. Patria Mane.

## 2018-03-23 NOTE — ED Provider Notes (Signed)
Assumed care as transport from med [facility] from Dr. Patria Mane.  Plan of care at this time to consult vascular surgery due to vascular occlusion in the right foot, was started on heparin bolus and then drip prior to arrival.  Patient resting comfortably on reassessment.  Vascular surgery consulted who recommended admission.  Patient given gabapentin, metformin here in the emergency department due to request.  Patient admitted to the hospital service in stable condition with stable vital signs.  The above care was discussed and agreed upon by my attending physician.   Dahlia Client, MD 03/23/18 Windy Carina    Blane Ohara, MD 03/24/18 [phone]

## 2018-03-23 NOTE — Progress Notes (Signed)
ANTICOAGULATION CONSULT NOTE - Initial Consult  Pharmacy Consult for heparin Indication: Arterial thrombus   No Known Allergies  Patient Measurements: Height: 5\' 3"  (160 cm) Weight: 155 lb (70.3 kg) IBW/kg (Calculated) : 52.4 Heparin Dosing Weight: 67kg  Vital Signs: Temp: 98.7 F (37.1 C) (03/07 1400) Temp Source: Oral (03/07 1400) BP: 96/62 (03/07 1507) Pulse Rate: 100 (03/07 1507)  Labs: Recent Labs    03/23/18 1450  HGB 11.3*  HCT 35.9*  PLT 285  CREATININE 1.02*    Estimated Creatinine Clearance: 48.3 mL/min (A) (by C-G formula based on SCr of 1.02 mg/dL (H)).   Medical History: Past Medical History:  Diagnosis Date  . Diabetes mellitus without complication (HCC)   . Hypertension   . 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  Hgb 11.3, plt 285. No s/sx of bleeding. Plan to transfer to [facility] for possible embolectomy/intervention.   Goal of Therapy:  Heparin level 0.3-0.7 units/ml Monitor platelets by anticoagulation protocol: Yes   Plan:  Heparin 4000 units x 1, and gtt at 1100 units/hr F/u 8 hour heparin level Monitor CBC, HL, and for s/sx of bleeding  Sherron Monday, PharmD, BCCCP Clinical Pharmacist  Pager: [phone] Phone: [phone] Please check AMION for all [facility] numbers 03/23/2018 3:36 PM

## 2018-03-23 NOTE — ED Notes (Addendum)
Family Corky Mull asks to be called when Pt goes upstairs. [phone]

## 2018-03-23 NOTE — ED Notes (Signed)
Pt on cardiac monitor and auto VS 

## 2018-03-23 NOTE — Consult Note (Addendum)
Hospital Consult    Reason for Consult: Concern for ischemic right lower extremity Referring Physician: ED MRN #:  [id number]  History of Present Illness: This is a 73 y.o. female with history of diabetes, hypertension, as well as chronic tobacco abuse (60+ years) that presents with right foot and calf pain since Monday.  Patient describes initially when she walked she got pain in her right calf described as crampy in nature.  Throughout the week she has had some increasing pain in the right foot.  She is able to wiggle her toes and states she does have some mild numbness but her main complaint is calf pain when walking that prompted her presentation at [facility].  She has smoked for 60+ years at least 1 pack/day.  She denies any previous lower extremity revascularization procedures.  States she works on her feet in [employer].  Past Medical History:  Diagnosis Date  . Diabetes mellitus without complication (HCC)   . Hypertension   . Thyroid disease     Past Surgical History:  Procedure Laterality Date  . ABDOMINAL HYSTERECTOMY    . BACK SURGERY    . SHOULDER SURGERY    . THYROID SURGERY      No Known Allergies  Prior to Admission medications   Not on File    Social History   Socioeconomic History  . Marital status: Unknown    Spouse name: Not on file  . Number of children: Not on file  . Years of education: Not on file  . Highest education level: Not on file  Occupational History  . Not on file  Social Needs  . Financial resource strain: Not on file  . Food insecurity:    Worry: Not on file    Inability: Not on file  . Transportation needs:    Medical: Not on file    Non-medical: Not on file  Tobacco Use  . Smoking status: Current Every Day Smoker    Types: Cigarettes  . Smokeless tobacco: Never Used  Substance and Sexual Activity  . Alcohol use: Yes    Comment: occasional  . Drug use: Never  . Sexual activity: Not on file  Lifestyle  .  Physical activity:    Days per week: Not on file    Minutes per session: Not on file  . Stress: Not on file  Relationships  . Social connections:    Talks on phone: Not on file    Gets together: Not on file    Attends religious service: Not on file    Active member of club or organization: Not on file    Attends meetings of clubs or organizations: Not on file    Relationship status: Not on file  . Intimate partner violence:    Fear of current or ex partner: Not on file    Emotionally abused: Not on file    Physically abused: Not on file    Forced sexual activity: Not on file  Other Topics Concern  . Not on file  Social History Narrative  . Not on file     No family history on file.  ROS:  Positive    Negative    All sytems reviewed and are negative  Cardiovascular:  chest pain/pressure  palpitations  SOB lying flat  DOE  pain in legs while walking  pain in legs at rest  pain in legs at night  non-healing ulcers  hx of DVT  swelling in  legs  Pulmonary: []  productive cough []  asthma/wheezing []  home O2  Neurologic: []  weakness in []  arms []  legs [x]  numbness in []  arms [x]  legs (right toe) []  hx of CVA []  mini stroke [] difficulty speaking or slurred speech []  temporary loss of vision in one eye []  dizziness  Hematologic: []  hx of cancer []  bleeding problems []  problems with blood clotting easily  Endocrine:   []  diabetes []  thyroid disease  GI []  vomiting blood []  blood in stool  GU: []  CKD/renal failure []  HD--[]  M/W/F or []  T/T/S []  burning with urination []  blood in urine  Psychiatric: []  anxiety []  depression  Musculoskeletal: []  arthritis []  joint pain  Integumentary: []  rashes []  ulcers  Constitutional: []  fever []  chills   Physical Examination  Vitals:   03/23/18 1530 03/23/18 1651  BP: (!) 104/59 100/75  Pulse: 98 93  Resp: 19 18  Temp:    SpO2: 91% 96%   Body mass index is 27.46  kg/m.  General:  WDWN in NAD Gait: Not observed HENT: WNL, normocephalic Pulmonary: normal non-labored breathing, without Rales, rhonchi,  wheezing Cardiac: regular, without  Murmurs, rubs or gallops Abdomen: soft, NT/ND, no masses Skin: without rashes Vascular Exam/Pulses:  Right Left  Radial 2+ (normal) 2+ (normal)  Ulnar    Femoral 2+ (normal) 2+ (normal)  Popliteal    DP absent Monphasic  PT absent Monophasic  Peroneal                          Monophasic   Extremities: Right foot slightly cooler but motor and sensory intact with appropriate capillary refill Musculoskeletal: no muscle wasting or atrophy  Neurologic: A&O X 3; Appropriate Affect ; SENSATION: normal; MOTOR FUNCTION:  moving all extremities equally. Speech is fluent/normal   CBC    Component Value Date/Time   WBC 8.6 03/23/2018 1450   RBC 3.70 (L) 03/23/2018 1450   HGB 11.3 (L) 03/23/2018 1450   HCT 35.9 (L) 03/23/2018 1450   PLT 285 03/23/2018 1450   MCV 97.0 03/23/2018 1450   MCH 30.5 03/23/2018 1450   MCHC 31.5 03/23/2018 1450   RDW 12.5 03/23/2018 1450    BMET    Component Value Date/Time   NA 136 03/23/2018 1450   K 4.3 03/23/2018 1450   CL 101 03/23/2018 1450   CO2 26 03/23/2018 1450   GLUCOSE 229 (H) 03/23/2018 1450   BUN 22 03/23/2018 1450   CREATININE 1.02 (H) 03/23/2018 1450   CALCIUM 8.7 (L) 03/23/2018 1450   GFRNONAA 56 (L) 03/23/2018 1450   GFRAA >60 03/23/2018 1450    COAGS: No results found for: INR, PROTIME   Non-Invasive Vascular Imaging:     CTA abdomen pelvis with runoff: On my review patient has approximate 50% plaque in the right common iliac artery, her right superficial femoral artery is occluded with reconstitution of flow in the proximal peroneal and the peroneal appears to be the dominant runoff in the right lower extremity.   ASSESSMENT/PLAN: This is a 73 y.o. female that presents with history of right lower extremity claudication and now increasing rest pain  since Monday. I suspect her symptoms are chronic given 60+ years of tobacco abuse with underlying diabetes especially after review of CTA.  On her CTA she has dominant runoff via the peroneal artery with severe tibial disease in the right lower extremity via collaterals and a long SFA occlusion and right iliac disease (also significant contralateral disease).  On exam here she has a peroneal signal that is appreciable on exam.  She remains motor and sensory intact.  My plan is to admit her on heparin gtt and will obtain vein mapping tomorrow and plan for arteriogram on M[place].  Potentially we could treat her right common iliac lesion and get her out of rest pain and if not she may require some other intervention including a possible bypass.  Again peroneal appears to be dominant runoff in right leg and does reconstitute on CT which correlates with her exam here.     Cephus Shelling, MD [facility] Office: [phone] Pager: [phone]

## 2018-03-23 NOTE — ED Notes (Signed)
ED Provider at bedside. 

## 2018-03-23 NOTE — ED Triage Notes (Signed)
Pt reports "charlie horse" pain in right leg since Monday. Pain is in calf and thigh. States she fell Wed at home because she was "off balance" due to pain in her leg

## 2018-03-23 NOTE — ED Notes (Signed)
Pt to CT

## 2018-03-23 NOTE — ED Notes (Signed)
[facility] departed with pt.

## 2018-03-23 NOTE — ED Notes (Signed)
ED TO INPATIENT HANDOFF REPORT  ED Nurse Name and Phone #: [phone]  S Name/Age/Gender Emma [name] 73 y.o. female Room/Bed: H013C/H013C  Code Status   Code Status: Full Code  Home/SNF/Other Home Patient oriented to: self, place, time and situation Is this baseline? Yes   Triage Complete: Triage complete  Chief Complaint Leg Pain, Cold Foot  Triage Note Pt reports "charlie horse" pain in right leg since Monday. Pain is in calf and thigh. States she fell Wed at home because she was "off balance" due to pain in her leg  [facility]- pt brought in from [facility] with right foot pain since Monday. On Wednesday pt went to PCP and ruled out a DVT. Currently there are no pedal pulse in the right foot but there is a popliteal pulse in the right leg. Right foot is cold with no discoloration. Pt received a heparin bolus of 4000 units and continuous heparin at 1100/hr. Pt also received morphine for pain and completed a CT angio.    VSS per [facility]    Allergies No Known Allergies  Level of Care/Admitting Diagnosis ED Disposition    ED Disposition Condition Comment   Admit  [name] Area: [facility] [[id number]]  Level of Care: Med-Surg [16]  Diagnosis: PAD (peripheral artery disease) [name]) [[id number]]  Admitting Physician: Cephus Shelling [[id number]]  Attending Physician: Cephus Shelling [[id number]]  Estimated length of stay: past midnight tomorrow  Certification:: I certify this patient will need inpatient services for at least 2 midnights  PT Class (Do Not Modify): Inpatient [101]  PT Acc Code (Do Not Modify): Private [1]       B Medical/Surgery History Past Medical History:  Diagnosis Date  . Diabetes mellitus without complication (HCC)   . Hypertension   . Thyroid disease    Past Surgical History:  Procedure Laterality Date  . ABDOMINAL HYSTERECTOMY    . BACK SURGERY    . SHOULDER SURGERY    . THYROID SURGERY       A IV  Location/Drains/Wounds Patient Lines/Drains/Airways Status   Active Line/Drains/Airways    Name:   Placement date:   Placement time:   Site:   Days:   Peripheral IV 03/23/18 Right;Upper Arm   03/23/18    1450    Arm   less than 1          Intake/Output Last 24 hours No intake or output data in the 24 hours ending 03/23/18 1859  Labs/Imaging Results for orders placed or performed during the [name] encounter of 03/23/18 (from the past 48 hour(s))  CBC     Status: Abnormal   Collection Time: 03/23/18  2:50 PM  Result Value Ref Range   WBC 8.6 4.0 - 10.5 K/uL   RBC 3.70 (L) 3.87 - 5.11 MIL/uL   Hemoglobin 11.3 (L) 12.0 - 15.0 g/dL   HCT 33.6 (L) 12.2 - 44.9 %   MCV 97.0 80.0 - 100.0 fL   MCH 30.5 26.0 - 34.0 pg   MCHC 31.5 30.0 - 36.0 g/dL   RDW 75.3 00.5 - 11.0 %   Platelets 285 150 - 400 K/uL   nRBC 0.0 0.0 - 0.2 %    Comment: Performed at [name], [address] [place], [place] [address]  Basic metabolic panel     Status: Abnormal   Collection Time: 03/23/18  2:50 PM  Result Value Ref Range   Sodium 136 135 - 145 mmol/L   Potassium 4.3  3.5 - 5.1 mmol/L   Chloride 101 98 - 111 mmol/L   CO2 26 22 - 32 mmol/L   Glucose, Bld 229 (H) 70 - 99 mg/dL   BUN 22 8 - 23 mg/dL   Creatinine, Ser 1.61 (H) 0.44 - 1.00 mg/dL   Calcium 8.7 (L) 8.9 - 10.3 mg/dL   GFR calc non Af Amer 56 (L) >60 mL/min   GFR calc Af Amer >60 >60 mL/min   Anion gap 9 5 - 15    Comment: Performed at [facility], [address] [place], [place] [address]  CBC     Status: Abnormal   Collection Time: 03/23/18  5:39 PM  Result Value Ref Range   WBC 9.6 4.0 - 10.5 K/uL   RBC 3.63 (L) 3.87 - 5.11 MIL/uL   Hemoglobin 10.9 (L) 12.0 - 15.0 g/dL   HCT 54.0 (L) 98.1 - 19.1 %   MCV 96.7 80.0 - 100.0 fL   MCH 30.0 26.0 - 34.0 pg   MCHC 31.1 30.0 - 36.0 g/dL   RDW 47.8 29.5 - 62.1 %   Platelets 319 150 - 400 K/uL   nRBC 0.0 0.0 - 0.2 %    Comment: Performed at [facility], [address], [place], [place] [address]   Ct Angio Ao+bifem W & Or Wo Contrast  Result Date: 03/23/2018 CLINICAL DATA:  Right lower extremity pain EXAM: CT ANGIOGRAPHY OF ABDOMINAL AORTA WITH ILIOFEMORAL RUNOFF TECHNIQUE: Multidetector CT imaging of the abdomen, pelvis and lower extremities was performed using the standard protocol during bolus administration of intravenous contrast. Multiplanar CT image reconstructions and MIPs were obtained to evaluate the vascular anatomy. CONTRAST:  ISOVUE-370 IOPAMIDOL (ISOVUE-370) INJECTION 76% COMPARISON:  None. FINDINGS: VASCULAR Aorta: Normal caliber. Extensive atherosclerotic calcifications. No dissection. Celiac: Widely patent SMA: Widely patent Renals: Calcified plaque at the origin of both renal arteries. No visible significant stenosis. IMA: Patent RIGHT Lower Extremity Inflow: Calcified and noncalcified irregular plaque noted in the right common iliac artery, narrowing the lumen greater than 50%. Outflow: Occlusion of the proximal right superficial femoral artery just below the bifurcation in the upper right thigh. Profundus is open. Runoff: Reconstitution of the peroneal artery in the proximal calf. Some flow noted within the anterior and posterior tibial arteries with reconstitution near the ankle. LEFT Lower Extremity Inflow: Heavily calcified iliofemoral vessels. No significant stenosis. Outflow: Heavily calcified but patent superficial femoral artery. Occlusion of the distal popliteal artery. Runoff: Some reconstitution of trifurcation vessels with blood flow noted in the proximal peroneal artery. No significant reconstitution of flow in the anterior tibial or posterior tibial. Veins: Grossly unremarkable. Review of the MIP images confirms the above findings. NON-VASCULAR Lower chest: Coronary artery calcifications. Lung bases are clear. No effusions. Heart is normal size. Hepatobiliary: No focal hepatic abnormality. Gallbladder unremarkable.  Pancreas: No focal abnormality or ductal dilatation. Spleen: No focal abnormality.  Normal size. Adrenals/Urinary Tract: No adrenal abnormality. No focal renal abnormality. No stones or hydronephrosis. Urinary bladder is unremarkable. Stomach/Bowel: Stomach, large and small bowel grossly unremarkable. Lymphatic: No adenopathy Reproductive: Prior hysterectomy.  No adnexal masses. Other: No free fluid or free air. Musculoskeletal: No acute bony abnormality. IMPRESSION: VASCULAR Heavily calcified aorta and iliofemoral vessels. Irregular plaque noted in the right common iliac artery with greater than 50% luminal narrowing. Occlusion of the proximal right superficial femoral artery. Reconstitution of flow noted in the proximal peroneal artery within the right calf. Reconstitution of flow in the  anterior tibial and posterior tibial at the ankle. Occlusion of the left distal popliteal artery. Reconstitution of flow in the peroneal artery. No visible flow in the anterior or posterior tibial arteries. Coronary artery disease. NON-VASCULAR No acute findings. Electronically Signed   By: Charlett Nose M.D.   On: 03/23/2018 16:37    Pending Labs Unresulted Labs (From admission, onward)    Start     Ordered   03/25/18 0500  Heparin level (unfractionated)  Daily,   R     03/23/18 1659   03/24/18 0500  CBC  Daily,   R     03/23/18 1659   03/24/18 0000  Heparin level (unfractionated)  Once-Timed,   R     03/23/18 1659   03/24/18 0000  CBC  Tomorrow morning,   R     03/23/18 1841   03/24/18 0000  Basic metabolic panel  Tomorrow morning,   R     03/23/18 1841   03/23/18 1545  CBC  Once,   R     03/23/18 1544          Vitals/Pain Today's Vitals   03/23/18 1507 03/23/18 1530 03/23/18 1651 03/23/18 1700  BP: 96/62 (!) 104/59 100/75   Pulse: 100 98 93   Resp: (!) 23 19 18    Temp:      TempSrc:      SpO2: 95% 91% 96%   Weight:   70.3 kg   Height:   5\' 3"  (1.6 m)   PainSc:    Asleep    Isolation  Precautions No active isolations  Medications Medications  heparin ADULT infusion 100 units/mL (25000 units/286mL sodium chloride 0.45%) (1,100 Units/hr Intravenous New Bag/Given 03/23/18 1553)  magnesium sulfate IVPB 2 g 50 mL (has no administration in time range)  potassium chloride SA (K-DUR,KLOR-CON) CR tablet 20-40 mEq (has no administration in time range)  acetaminophen (TYLENOL) tablet 325-650 mg (has no administration in time range)    Or  acetaminophen (TYLENOL) suppository 325-650 mg (has no administration in time range)  docusate sodium (COLACE) capsule 100 mg (has no administration in time range)  ondansetron (ZOFRAN) injection 4 mg (has no administration in time range)  alum & mag hydroxide-simeth (MAALOX/MYLANTA) 200-200-20 MG/5ML suspension 15-30 mL (has no administration in time range)  pantoprazole (PROTONIX) EC tablet 40 mg (has no administration in time range)  labetalol (NORMODYNE,TRANDATE) injection 10 mg (has no administration in time range)  hydrALAZINE (APRESOLINE) injection 5 mg (has no administration in time range)  guaiFENesin-dextromethorphan (ROBITUSSIN DM) 100-10 MG/5ML syrup 15 mL (has no administration in time range)  0.9 %  sodium chloride infusion (has no administration in time range)  oxyCODONE (Oxy IR/ROXICODONE) immediate release tablet 5-10 mg (has no administration in time range)  gabapentin (NEURONTIN) tablet 1,200 mg (has no administration in time range)  morphine 4 MG/ML injection 4 mg (4 mg Intravenous Given 03/23/18 1503)  heparin bolus via infusion 4,000 Units (4,000 Units Intravenous Bolus from Bag 03/23/18 1552)  iopamidol (ISOVUE-370) 76 % injection 100 mL (100 mLs Intravenous Contrast Given 03/23/18 1551)  metFORMIN (GLUCOPHAGE) tablet 1,000 mg (1,000 mg Oral Given 03/23/18 1854)    Mobility walks with person assist Low fall risk   Focused Assessments    R Recommendations: See Admitting Provider Note  Report given to:   Additional Notes:

## 2018-03-23 NOTE — ED Notes (Signed)
Unsuccessful IV stick x1

## 2018-03-23 NOTE — Progress Notes (Signed)
Pt received from ED, AO x4. Breathing even and unlabored in room air. Right leg pain 4/10, denied any need for pain medication. CHG bath completed, connected to tele. CCMD called and notified. Oriented pt to room and call bell system. heparin running at 37ml/hr via Right AC PIV. Call bell within reach, will continue to monitor.

## 2018-03-23 NOTE — ED Notes (Addendum)
Emma [name] [phone]...call for ride home and updates.

## 2018-03-23 NOTE — ED Provider Notes (Signed)
[facility] EMERGENCY DEPARTMENT Provider Note   CSN: [id number] Arrival date & time: 03/23/18  1340    History   Chief Complaint Chief Complaint  Patient presents with  . Leg Pain    HPI Emma [name] is a 73 y.o. female.     HPI Patient is a 73 year old female who presents to the emergency department complaints of right calf and right lower extremity pain since Monday.  She was walking when she developed sudden onset pain in her right calf.  She is continued having pain since Monday and noticed some discoloration of her right foot and thus came to the ER for further evaluation.  She reports she was seen at a clinic earlier in the week and they did an ultrasound which demonstrated no evidence of DVT.  No chest pain.  No shortness of breath.  No history of atrial fibrillation.  No prior history of stroke or heart attack.  No prior known history of peripheral arterial disease.  She is on aspirin daily but no other anticoagulants.  Her pain in her right foot is mild in severity.   Past Medical History:  Diagnosis Date  . Diabetes mellitus without complication (HCC)   . Hypertension   . Thyroid disease     There are no active problems to display for this patient.   Past Surgical History:  Procedure Laterality Date  . ABDOMINAL HYSTERECTOMY    . BACK SURGERY    . SHOULDER SURGERY    . THYROID SURGERY       OB History   No obstetric history on file.      Home Medications    Prior to Admission medications   Not on File    Family History No family history on file.  Social History Social History   Tobacco Use  . Smoking status: Current Every Day Smoker    Types: Cigarettes  . Smokeless tobacco: Never Used  Substance Use Topics  . Alcohol use: Yes    Comment: occasional  . Drug use: Never     Allergies   Patient has no known allergies.   Review of Systems Review of Systems  All other systems reviewed and are negative.    Physical  Exam Updated Vital Signs BP 105/65 (BP Location: Right Arm)   Pulse 100   Temp 98.7 F (37.1 C) (Oral)   Resp 18   Ht 5\' 3"  (1.6 m)   Wt 70.3 kg   SpO2 98%   BMI 27.46 kg/m   Physical Exam Vitals signs and nursing note reviewed.  Constitutional:      General: She is not in acute distress.    Appearance: She is well-developed.  HENT:     Head: Normocephalic and atraumatic.  Neck:     Musculoskeletal: Normal range of motion.  Cardiovascular:     Rate and Rhythm: Normal rate and regular rhythm.     Heart sounds: Normal heart sounds.  Pulmonary:     Effort: Pulmonary effort is normal.     Breath sounds: Normal breath sounds.  Abdominal:     General: There is no distension.     Palpations: Abdomen is soft.     Tenderness: There is no abdominal tenderness.  Musculoskeletal:     Comments: Discoloration of right foot.  Absent PT and DP pulse both by palpation and Doppler.  Patient with dopplerable right popliteal pulse.  Still able to move her right foot.  No swelling of the right lower extremity  as compared to left.  No discoloration of the right thigh.  Normal PT and DP pulse in the left foot  Skin:    General: Skin is warm.  Neurological:     Mental Status: She is alert and oriented to person, place, and time.  Psychiatric:        Judgment: Judgment normal.      ED Treatments / Results  Labs (all labs ordered are listed, but only abnormal results are displayed) Labs Reviewed  CBC - Abnormal; Notable for the following components:      Result Value   RBC 3.70 (*)    Hemoglobin 11.3 (*)    HCT 35.9 (*)    All other components within normal limits  BASIC METABOLIC PANEL    EKG EKG Interpretation  Date/Time:  Saturday March 23 2018 14:31:42 EST Ventricular Rate:  102 PR Interval:    QRS Duration: 99 QT Interval:  333 QTC Calculation: 438 R Axis:   -22 Text Interpretation:  Sinus tachycardia Consider right atrial enlargement Borderline left axis deviation Low  voltage, extremity and precordial leads Borderline ST elevation, anterolateral leads Baseline wander in lead(s) I II III aVR aVF V4 No old tracing to compare Confirmed by Azalia Bilis ([id number]) on 03/23/2018 2:59:29 PM   Radiology No results found.  Procedures .Critical Care Performed by: Azalia Bilis, MD Authorized by: Azalia Bilis, MD   Critical care provider statement:    Critical care time (minutes):  32   Critical care was time spent personally by me on the following activities:  Discussions with consultants, evaluation of patient's response to treatment, examination of patient, ordering and performing treatments and interventions, ordering and review of laboratory studies, ordering and review of radiographic studies, pulse oximetry, re-evaluation of patient's condition, obtaining history from patient or surrogate and review of old charts   (including critical care time)  Medications Ordered in ED Medications - No data to display   Initial Impression / Assessment and Plan / ED Course  I have reviewed the triage vital signs and the nursing notes.  Pertinent labs & imaging results that were available during my care of the patient were reviewed by me and considered in my medical decision making (see chart for details).        Patient with absent palpable and dopplerable pulses in the right foot.  She does have palpable pulse in the right popliteal artery.  Ischemic right lower extremity.  Patient will be initiated on heparin at this time.  Labs pending.  EKG demonstrates normal sinus rhythm.  Case was discussed with vascular surgery, Dr. Chestine Spore who accepts the patient in transfer to the [facility] emergency department for possible embolectomy/intervention.  He agrees with CT angios abdomen pelvis with bilateral lower extremity runoff.  Patient will be transferred via [facility].  Patient updated as to the concerning findings on physical exam and the need for vascular surgery  consultation.  Final Clinical Impressions(s) / ED Diagnoses   Final diagnoses:  Ischemia of right lower extremity    ED Discharge Orders    None       Azalia Bilis, MD 03/23/18 1501

## 2018-03-24 ENCOUNTER — Inpatient Hospital Stay (HOSPITAL_COMMUNITY): Payer: Medicare HMO

## 2018-03-24 DIAGNOSIS — I739 Peripheral vascular disease, unspecified: Secondary | ICD-10-CM

## 2018-03-24 DIAGNOSIS — R52 Pain, unspecified: Secondary | ICD-10-CM

## 2018-03-24 LAB — BASIC METABOLIC PANEL
Anion gap: 10 (ref 5–15)
BUN: 16 mg/dL (ref 8–23)
CO2: 21 mmol/L — ABNORMAL LOW (ref 22–32)
Calcium: 8.7 mg/dL — ABNORMAL LOW (ref 8.9–10.3)
Chloride: 102 mmol/L (ref 98–111)
Creatinine, Ser: 1.16 mg/dL — ABNORMAL HIGH (ref 0.44–1.00)
GFR calc Af Amer: 55 mL/min — ABNORMAL LOW (ref >60)
GFR calc non Af Amer: 48 mL/min — ABNORMAL LOW (ref >60)
GLUCOSE: 239 mg/dL — AB (ref 70–99)
Potassium: 4 mmol/L (ref 3.5–5.1)
Sodium: 133 mmol/L — ABNORMAL LOW (ref 135–145)

## 2018-03-24 LAB — GLUCOSE, CAPILLARY
Glucose-Capillary: 210 mg/dL — ABNORMAL HIGH (ref 70–99)
Glucose-Capillary: 249 mg/dL — ABNORMAL HIGH (ref 70–99)
Glucose-Capillary: 300 mg/dL — ABNORMAL HIGH (ref 70–99)
Glucose-Capillary: 275 mg/dL — ABNORMAL HIGH (ref 70–99)

## 2018-03-24 LAB — CBC
HCT: 34.9 % — ABNORMAL LOW (ref 36.0–46.0)
Hemoglobin: 10.9 g/dL — ABNORMAL LOW (ref 12.0–15.0)
MCH: 29.9 pg (ref 26.0–34.0)
MCHC: 31.2 g/dL (ref 30.0–36.0)
MCV: 95.6 fL (ref 80.0–100.0)
Platelets: 303 10*3/uL (ref 150–400)
RBC: 3.65 MIL/uL — ABNORMAL LOW (ref 3.87–5.11)
RDW: 12.5 % (ref 11.5–15.5)
WBC: 8.6 10*3/uL (ref 4.0–10.5)
nRBC: 0 % (ref 0.0–0.2)

## 2018-03-24 LAB — HEPARIN LEVEL (UNFRACTIONATED)
HEPARIN UNFRACTIONATED: 0.93 [IU]/mL — AB (ref 0.30–0.70)
Heparin Unfractionated: 0.31 IU/mL (ref 0.30–0.70)
Heparin Unfractionated: 0.61 IU/mL (ref 0.30–0.70)

## 2018-03-24 LAB — SURGICAL PCR SCREEN
MRSA, PCR: NEGATIVE
Staphylococcus aureus: NEGATIVE

## 2018-03-24 NOTE — Progress Notes (Signed)
VASCULAR LAB PRELIMINARY  PRELIMINARY  PRELIMINARY  PRELIMINARY  ABIs completed.    Preliminary report:  See results in CV proc  Affan Callow, RVT 03/24/2018, 1:10 PM

## 2018-03-24 NOTE — Progress Notes (Signed)
ANTICOAGULATION CONSULT NOTE - Follow Up Consult  Pharmacy Consult for heparin Indication: arterial thrombus  Labs: Recent Labs    03/23/18 1450 03/23/18 1739 03/24/18 0013  HGB 11.3* 10.9* 10.9*  HCT 35.9* 35.1* 34.9*  PLT 285 319 303  HEPARINUNFRC  --   --  0.31  CREATININE 1.02*  --  1.16*    Assessment: 73yo female therapeutic on heparin though at low end of goal and would prefer higher w/ arterial thrombus; no gtt issues or signs of bleeding per RN.  Goal of Therapy:  Heparin level 0.3-0.7 units/ml   Plan:  Will increase heparin gtt by ~1 units/kg/hr to 1200 units/hr and check level in 8 hours.    Vernard Gambles, PharmD, BCPS  03/24/2018,1:48 AM

## 2018-03-24 NOTE — Progress Notes (Signed)
[facility] [place]  Subjective  - Still having some right foot pain about 5 out of 10.  Can move foot and good sensation.   Objective 120/71 83 98.6 F (37 C) (Oral) 17 98%  Intake/Output Summary (Last 24 hours) at 03/24/2018 0950 Last data filed at 03/24/2018 0300 Gross per 24 hour  Intake 123.45 ml  Output -  Net 123.45 ml    Bilateral femoral pulses palpable Left DP/PT monophasic signals Right peroneal monophasic signal Motor and sensory intact No tissue loss  Laboratory Lab Results: Recent Labs    03/23/18 1739 03/24/18 0013  WBC 9.6 8.6  HGB 10.9* 10.9*  HCT 35.1* 34.9*  PLT 319 303   BMET Recent Labs    03/23/18 1450 03/24/18 0013  NA 136 133*  K 4.3 4.0  CL 101 102  CO2 26 21*  GLUCOSE 229* 239*  BUN 22 16  CREATININE 1.02* 1.16*  CALCIUM 8.7* 8.7*    COAG No results found for: INR, PROTIME No results found for: PTT  Assessment/Planning:  Plan for vein mapping today.  Aortogram/arteriogram tomorrow.  Multi-level disease on CT including iliac disease, long SFA occlusion, severe tibial disease with only peroneal runoff.  Suspect her presentation is chronic given hx claudication now progressing to rest pain.  Long history of tobacco abuse and diabetes.  Continue heparin gtt for now.    Cephus Shelling 03/24/2018 9:50 AM --

## 2018-03-24 NOTE — Progress Notes (Signed)
VASCULAR LAB PRELIMINARY  PRELIMINARY  PRELIMINARY  PRELIMINARY  Bilateral vein mapping completed.    Preliminary report:  See CV proc for results  Jaiona Simien, RVT 03/24/2018, 12:57 PM

## 2018-03-24 NOTE — Progress Notes (Addendum)
ANTICOAGULATION CONSULT NOTE  Pharmacy Consult for heparin Indication: Arterial thrombus   No Known Allergies  Patient Measurements: Height: 5\' 3"  (160 cm) Weight: 155 lb 3.3 oz (70.4 kg) IBW/kg (Calculated) : 52.4 Heparin Dosing Weight: 67kg  Vital Signs: Temp: 98.6 F (37 C) (03/08 0749) Temp Source: Oral (03/08 0749) BP: 120/71 (03/08 0749) Pulse Rate: 83 (03/08 0749)  Labs: Recent Labs    03/23/18 1450 03/23/18 1739 03/24/18 0013 03/24/18 0958  HGB 11.3* 10.9* 10.9*  --   HCT 35.9* 35.1* 34.9*  --   PLT 285 319 303  --   HEPARINUNFRC  --   --  0.31 0.93*  CREATININE 1.02*  --  1.16*  --     Estimated Creatinine Clearance: 42.5 mL/min (A) (by C-G formula based on SCr of 1.16 mg/dL (H)).   Medical History: Past Medical History:  Diagnosis Date  . Diabetes mellitus without complication (HCC)   . Hypertension   . Thyroid disease     Assessment: 89 YOF with arterial thrombus in right leg, no AC PTA.  Heparin infusion was moved from [facility] to L hand overnight. Initial heparin level therapeutic, now confirmatory came back supratherapeutic at 0.93, on 1200 units/hr. Level was drawn from opposite side of infusion. Hgb 10.9, plt 303. No s/sx of bleeding. No other infusion issues per nursing.   Goal of Therapy:  Heparin level 0.3-0.7 units/ml Monitor platelets by anticoagulation protocol: Yes   Plan:  Decrease heparin infusion to 1050 units/hr F/u 8 hour heparin level Monitor CBC, HL, and for s/sx of bleeding  Sherron Monday, PharmD, BCCCP Clinical Pharmacist  Pager: [phone] Phone: [phone] Please check AMION for all [facility] Pharmacy numbers 03/24/2018 11:57 AM   ADDENDUM Heparin level came back therapeutic at 0.61, on 1050 units/hr. No s/sx of bleeding or infusion issues. Continue at same rate of 1050 units/hr and monitor HL with morning labs.  Sherron Monday, PharmD, BCCCP Clinical Pharmacist

## 2018-03-25 ENCOUNTER — Encounter (HOSPITAL_COMMUNITY)
Admission: EM | Disposition: A | Discharge status: Skilled Nursing Facility | Payer: Self-pay | Source: Home / Self Care | Attending: Vascular Surgery

## 2018-03-25 ENCOUNTER — Encounter (HOSPITAL_COMMUNITY): Payer: Self-pay | Admitting: Vascular Surgery

## 2018-03-25 HISTORY — PX: ABDOMINAL AORTOGRAM: CATH118222

## 2018-03-25 HISTORY — PX: LOWER EXTREMITY ANGIOGRAPHY: CATH118251

## 2018-03-25 LAB — BASIC METABOLIC PANEL WITH GFR
Anion gap: 10 (ref 5–15)
BUN: 14 mg/dL (ref 8–23)
CO2: 26 mmol/L (ref 22–32)
Calcium: 8.6 mg/dL — ABNORMAL LOW (ref 8.9–10.3)
Chloride: 102 mmol/L (ref 98–111)
Creatinine, Ser: 1.06 mg/dL — ABNORMAL HIGH (ref 0.44–1.00)
GFR calc Af Amer: >60 mL/min
GFR calc non Af Amer: 53 mL/min — ABNORMAL LOW
Glucose, Bld: 251 mg/dL — ABNORMAL HIGH (ref 70–99)
Potassium: 4.2 mmol/L (ref 3.5–5.1)
Sodium: 138 mmol/L (ref 135–145)

## 2018-03-25 LAB — CBC
HCT: 32 % — ABNORMAL LOW (ref 36.0–46.0)
Hemoglobin: 10 g/dL — ABNORMAL LOW (ref 12.0–15.0)
MCH: 29.8 pg (ref 26.0–34.0)
MCHC: 31.3 g/dL (ref 30.0–36.0)
MCV: 95.2 fL (ref 80.0–100.0)
Platelets: 273 K/uL (ref 150–400)
RBC: 3.36 MIL/uL — ABNORMAL LOW (ref 3.87–5.11)
RDW: 12.3 % (ref 11.5–15.5)
WBC: 7.8 K/uL (ref 4.0–10.5)
nRBC: 0 % (ref 0.0–0.2)

## 2018-03-25 LAB — GLUCOSE, CAPILLARY
Glucose-Capillary: 222 mg/dL — ABNORMAL HIGH (ref 70–99)
Glucose-Capillary: 150 mg/dL — ABNORMAL HIGH (ref 70–99)
Glucose-Capillary: 242 mg/dL — ABNORMAL HIGH (ref 70–99)
Glucose-Capillary: 241 mg/dL — ABNORMAL HIGH (ref 70–99)

## 2018-03-25 LAB — HEPARIN LEVEL (UNFRACTIONATED): HEPARIN UNFRACTIONATED: 0.57 [IU]/mL (ref 0.30–0.70)

## 2018-03-25 LAB — PROTIME-INR
INR: 1 (ref 0.8–1.2)
Prothrombin Time: 13.5 seconds (ref 11.4–15.2)

## 2018-03-25 SURGERY — LOWER EXTREMITY ANGIOGRAPHY
Anesthesia: LOCAL

## 2018-03-25 MED ORDER — ACETAMINOPHEN 325 MG PO TABS: 650.0000 mg | ORAL_TABLET | ORAL | Status: DC | PRN

## 2018-03-25 MED ORDER — HEPARIN (PORCINE) 25000 UT/250ML-% IV SOLN
1050.0000 [IU]/h | INTRAVENOUS | Status: DC
  Administered 2018-03-25 (×2): 1050 [IU]/h via INTRAVENOUS
  Filled 2018-03-25: qty 250

## 2018-03-25 MED ORDER — FENTANYL CITRATE (PF) 100 MCG/2ML IJ SOLN
INTRAMUSCULAR | Status: DC | PRN
  Administered 2018-03-25: 50 ug via INTRAVENOUS

## 2018-03-25 MED ORDER — HYDRALAZINE HCL 20 MG/ML IJ SOLN: 5.0000 mg | INTRAMUSCULAR | Status: DC | PRN

## 2018-03-25 MED ORDER — MIDAZOLAM HCL 2 MG/2ML IJ SOLN
INTRAMUSCULAR | Status: DC | PRN
  Administered 2018-03-25: 1 mg via INTRAVENOUS

## 2018-03-25 MED ORDER — ASPIRIN EC 81 MG PO TBEC
81.0000 mg | DELAYED_RELEASE_TABLET | Freq: Every day | ORAL | Status: DC
  Administered 2018-03-25 – 2018-04-04 (×8): 81 mg via ORAL
  Filled 2018-03-25 (×10): qty 1

## 2018-03-25 MED ORDER — LABETALOL HCL 5 MG/ML IV SOLN: 10.0000 mg | INTRAVENOUS | Status: DC | PRN

## 2018-03-25 MED ORDER — SODIUM CHLORIDE 0.9% FLUSH: 3.0000 mL | INTRAVENOUS | Status: DC | PRN

## 2018-03-25 MED ORDER — HEPARIN (PORCINE) IN NACL 1000-0.9 UT/500ML-% IV SOLN
INTRAVENOUS | Status: AC
  Filled 2018-03-25: qty 1000

## 2018-03-25 MED ORDER — HEPARIN (PORCINE) IN NACL 1000-0.9 UT/500ML-% IV SOLN
INTRAVENOUS | Status: DC | PRN
  Administered 2018-03-25 (×2): 500 mL

## 2018-03-25 MED ORDER — LIDOCAINE HCL (PF) 1 % IJ SOLN
INTRAMUSCULAR | Status: DC | PRN
  Administered 2018-03-25: 15 mL

## 2018-03-25 MED ORDER — MORPHINE SULFATE (PF) 2 MG/ML IV SOLN
2.0000 mg | INTRAVENOUS | Status: DC | PRN
  Administered 2018-03-26 – 2018-03-28 (×6): 2 mg via INTRAVENOUS
  Filled 2018-03-25 (×6): qty 1

## 2018-03-25 MED ORDER — FENTANYL CITRATE (PF) 100 MCG/2ML IJ SOLN
INTRAMUSCULAR | Status: AC
  Filled 2018-03-25: qty 2

## 2018-03-25 MED ORDER — IODIXANOL 320 MG/ML IV SOLN
INTRAVENOUS | Status: DC | PRN
  Administered 2018-03-25: 120 mL via INTRAVENOUS

## 2018-03-25 MED ORDER — SODIUM CHLORIDE 0.9% FLUSH
3.0000 mL | Freq: Two times a day (BID) | INTRAVENOUS | Status: DC
  Administered 2018-03-25: 3 mL via INTRAVENOUS

## 2018-03-25 MED ORDER — ONDANSETRON HCL 4 MG/2ML IJ SOLN: 4.0000 mg | Freq: Four times a day (QID) | INTRAMUSCULAR | Status: DC | PRN

## 2018-03-25 MED ORDER — MIDAZOLAM HCL 2 MG/2ML IJ SOLN
INTRAMUSCULAR | Status: AC
  Filled 2018-03-25: qty 2

## 2018-03-25 MED ORDER — LIDOCAINE HCL (PF) 1 % IJ SOLN
INTRAMUSCULAR | Status: AC
  Filled 2018-03-25: qty 30

## 2018-03-25 MED ORDER — SODIUM CHLORIDE 0.9 % IV SOLN: 250.0000 mL | INTRAVENOUS | Status: DC | PRN

## 2018-03-25 MED ORDER — SODIUM CHLORIDE 0.9 % IV SOLN
INTRAVENOUS | Status: AC
  Administered 2018-03-25: 11:00:00 via INTRAVENOUS

## 2018-03-25 MED ORDER — OXYCODONE HCL 5 MG PO TABS
5.0000 mg | ORAL_TABLET | ORAL | Status: DC | PRN
  Administered 2018-03-25: 10 mg via ORAL
  Administered 2018-03-27: 5 mg via ORAL
  Administered 2018-03-28 – 2018-03-29 (×6): 10 mg via ORAL
  Filled 2018-03-25 (×6): qty 2
  Filled 2018-03-25: qty 1
  Filled 2018-03-25 (×2): qty 2

## 2018-03-25 SURGICAL SUPPLY — 10 items
CATH OMNI FLUSH 5F 65CM (CATHETERS) ×1 IMPLANT
CLOSURE MYNX CONTROL 5F (Vascular Products) ×1 IMPLANT
KIT MICROPUNCTURE NIT STIFF (SHEATH) ×1 IMPLANT
KIT PV (KITS) ×3 IMPLANT
SHEATH PINNACLE 5F 10CM (SHEATH) ×1 IMPLANT
SHEATH PROBE COVER 6X72 (BAG) ×1 IMPLANT
SYR MEDRAD MARK V 150ML (SYRINGE) ×1 IMPLANT
TRANSDUCER W/STOPCOCK (MISCELLANEOUS) ×3 IMPLANT
TRAY PV CATH (CUSTOM PROCEDURE TRAY) ×3 IMPLANT
WIRE BENTSON .035X145CM (WIRE) ×1 IMPLANT

## 2018-03-25 NOTE — Progress Notes (Signed)
[facility] [place]  Subjective  - Still having some right foot pain about 5 out of 10.  Can move foot and good sensation.   Objective 126/74 81 98.4 F (36.9 C) (Oral) 13 95%  Intake/Output Summary (Last 24 hours) at 03/25/2018 0745 Last data filed at 03/25/2018 0300 Gross per 24 hour  Intake 610.02 ml  Output 300 ml  Net 310.02 ml    Bilateral femoral pulses palpable Left DP/PT monophasic signals Right peroneal monophasic signal Motor and sensory intact No tissue loss  Laboratory Lab Results: Recent Labs    03/24/18 0013 03/25/18 0253  WBC 8.6 7.8  HGB 10.9* 10.0*  HCT 34.9* 32.0*  PLT 303 273   BMET Recent Labs    03/24/18 0013 03/25/18 0253  NA 133* 138  K 4.0 4.2  CL 102 102  CO2 21* 26  GLUCOSE 239* 251*  BUN 16 14  CREATININE 1.16* 1.06*  CALCIUM 8.7* 8.6*    COAG Lab Results  Component Value Date   INR 1.0 03/25/2018   No results found for: PTT  Assessment/Planning: Aortogram/arteriogram today with Dr. Randie Heinz.  Multi-level disease on CT including iliac disease, long SFA occlusion, severe tibial disease with only peroneal runoff.  Suspect her presentation is chronic given hx claudication now progressing to rest pain.  Long history of tobacco abuse and diabetes.  Continue heparin gtt for now.    Cephus Shelling 03/25/2018 7:45 AM --

## 2018-03-25 NOTE — Progress Notes (Signed)
ANTICOAGULATION CONSULT NOTE  Pharmacy Consult for heparin Indication: Arterial thrombus   No Known Allergies  Patient Measurements: Height: 5\' 3"  (160 cm) Weight: 155 lb 3.3 oz (70.4 kg) IBW/kg (Calculated) : 52.4 Heparin Dosing Weight: 67kg  Vital Signs: Temp: 98.1 F (36.7 C) (03/09 1058) Temp Source: Oral (03/09 1058) BP: 116/99 (03/09 1058) Pulse Rate: 79 (03/09 1058)  Labs: Recent Labs    03/23/18 1450 03/23/18 1739  03/24/18 0013 03/24/18 0958 03/24/18 2005 03/25/18 0253  HGB 11.3* 10.9*  --  10.9*  --   --  10.0*  HCT 35.9* 35.1*  --  34.9*  --   --  32.0*  PLT 285 319  --  303  --   --  273  LABPROT  --   --   --   --   --   --  13.5  INR  --   --   --   --   --   --  1.0  HEPARINUNFRC  --   --    < > 0.31 0.93* 0.61 0.57  CREATININE 1.02*  --   --  1.16*  --   --  1.06*   < > = values in this interval not displayed.    Estimated Creatinine Clearance: 46.5 mL/min (A) (by C-G formula based on SCr of 1.06 mg/dL (H)).   Medical History: Past Medical History:  Diagnosis Date  . Diabetes mellitus without complication (HCC)   . Hypertension   . Thyroid disease     Assessment: 32 YOF with arterial thrombus in right leg, no AC PTA.  Heparin level therapeutic at 1050 unit/hr IV. Hgb 10.0, Plt 273. No s/sx of bleeding. Heparin infusion held for right lower extremity angiogram. Needs to be restarted 8 hours after sheath removal.  Goal of Therapy:  Heparin level 0.3-0.7 units/ml Monitor platelets by anticoagulation protocol: Yes   Plan:  Restart heparin infusion at 1050 units/hr at 1830 (Sheath was removed at 1030) F/u 8 hour heparin level Monitor CBC, HL, and for s/sx of bleeding  Carlena Hurl, PharmD Candidate Please check AMION for all [facility] Pharmacy numbers 03/25/2018 11:19 AM

## 2018-03-25 NOTE — Progress Notes (Signed)
   Plan for right lower extremity angiogram today in pv lab. Discussed possible need for bypass.   Brandon C. Randie Heinz, MD [facility] [place] Office: [phone] Pager: [phone]

## 2018-03-25 NOTE — Progress Notes (Signed)
Inpatient Diabetes Program Recommendations  AACE/ADA: New Consensus Statement on Inpatient Glycemic Control (2015)  Target Ranges:  Prepandial:   less than 140 mg/dL      Peak postprandial:   less than 180 mg/dL (1-2 hours)      Critically ill patients:  140 - 180 mg/dL   Lab Results  Component Value Date   GLUCAP 150 (H) 03/25/2018    Review of Glycemic Control Results for [name], [name] (MRN [id number]) as of 03/25/2018 15:09  Ref. Range 03/24/2018 16:54 03/24/2018 21:44 03/25/2018 06:25 03/25/2018 11:30  Glucose-Capillary Latest Ref Range: 70 - 99 mg/dL 440 (H) 102 (H) 725 (H) 150 (H)   Diabetes history: Type 2 DM Outpatient Diabetes medications: Metformin 1000 mg BID Current orders for Inpatient glycemic control: Novolog 0-15 units TID  Inpatient Diabetes Program Recommendations:    No noted A1C, consider repeating A1C?  Additionally, may want to consider starting Levemir 8 units QHS.  Thanks, Emma Rave, MSN, RNC-OB Diabetes Coordinator [phone] (8a-5p)

## 2018-03-25 NOTE — Op Note (Signed)
    Patient name: Emma [name] MRN: [id number] DOB: 03-Sep-1945 Sex: female  03/25/2018 Pre-operative Diagnosis: Critical right lower extremity ischemia Post-operative diagnosis:  Same Surgeon:  Apolinar Junes C. Randie Heinz, MD Procedure Performed: 1.  Ultrasound-guided cannulation left common femoral artery 2.  Aortogram bilateral lower extremity runoff 3.  Moderate sedation with fentanyl and Versed for 4.  Minx device closure left common femoral artery  Indications: 73 year old female with recent history of rest pain right lower extremity ABIs severely diminished.  She is indicated for angiogram possible intervention.  Findings: Aorta is free of flow-limiting stenosis.  There appears to possibly be a mobile thrombus in the right common iliac artery.  SFA is occluded profunda is her only flow in the thigh.  She reconstitutes the peroneal artery at the TP trunk and posterior tibial artery at the ankle on the right.  Left side she has an SFA that occludes at the above-knee popliteal seems to reconstitute peroneal artery as well which gives off posterior tibial at the ankle.  Patient will be considered for right femoral to peroneal artery bypass.   Procedure:  The patient was identified in the holding area and taken to room 8.  The patient was then placed supine on the table and prepped and draped in the usual sterile fashion.  A time out was called.  Ultrasound was used to evaluate the left common femoral artery which is known to be patent.  This was cannulated with micropuncture needle followed by wire and sheath.  Bentson wire was placed followed by 5 French sheath and Omni cath placed to level of L1.  Aortogram performed followed by bilateral lower extremity runoff.  With the above findings we elected to remove the catheter over wire.  She will be scheduled for right femoral to peroneal artery bypass in the very near future.  She tolerated this procedure without any complication.  Minx device was  used to close the left common femoral artery puncture site.  Contrast: 120cc   Kashlynn Kundert C. Randie Heinz, MD [facility] [place] Office: [phone] Pager: [phone]

## 2018-03-26 ENCOUNTER — Encounter (HOSPITAL_COMMUNITY)
Admission: EM | Disposition: A | Discharge status: Skilled Nursing Facility | Payer: Self-pay | Source: Home / Self Care | Attending: Vascular Surgery

## 2018-03-26 ENCOUNTER — Inpatient Hospital Stay (HOSPITAL_COMMUNITY): Payer: Medicare HMO

## 2018-03-26 ENCOUNTER — Inpatient Hospital Stay (HOSPITAL_COMMUNITY): Payer: Medicare HMO | Admitting: Certified Registered Nurse Anesthetist

## 2018-03-26 ENCOUNTER — Encounter (HOSPITAL_COMMUNITY): Payer: Self-pay | Admitting: Orthopedic Surgery

## 2018-03-26 DIAGNOSIS — I70221 Atherosclerosis of native arteries of extremities with rest pain, right leg: Secondary | ICD-10-CM

## 2018-03-26 HISTORY — PX: VEIN HARVEST: SHX6363

## 2018-03-26 HISTORY — PX: INTRAOPERATIVE ARTERIOGRAM: SHX5157

## 2018-03-26 HISTORY — PX: BYPASS GRAFT FEMORAL-PERONEAL: SHX5762

## 2018-03-26 HISTORY — PX: PATCH ANGIOPLASTY: SHX6230

## 2018-03-26 LAB — POCT I-STAT 7, (LYTES, BLD GAS, ICA,H+H)
Bicarbonate: 24.9 mmol/L (ref 20.0–28.0)
Calcium, Ion: 1.17 mmol/L (ref 1.15–1.40)
HCT: 26 % — ABNORMAL LOW (ref 36.0–46.0)
Hemoglobin: 8.8 g/dL — ABNORMAL LOW (ref 12.0–15.0)
O2 Saturation: 91 %
PO2 ART: 62 mmHg — AB (ref 83.0–108.0)
Patient temperature: 98.9
Potassium: 4.6 mmol/L (ref 3.5–5.1)
Sodium: 139 mmol/L (ref 135–145)
TCO2: 26 mmol/L (ref 22–32)
pCO2 arterial: 42 mmHg (ref 32.0–48.0)
pH, Arterial: 7.382 (ref 7.350–7.450)

## 2018-03-26 LAB — CBC
HEMATOCRIT: 30.6 % — AB (ref 36.0–46.0)
Hemoglobin: 9.8 g/dL — ABNORMAL LOW (ref 12.0–15.0)
MCH: 30.4 pg (ref 26.0–34.0)
MCHC: 32 g/dL (ref 30.0–36.0)
MCV: 95 fL (ref 80.0–100.0)
Platelets: 293 10*3/uL (ref 150–400)
RBC: 3.22 MIL/uL — ABNORMAL LOW (ref 3.87–5.11)
RDW: 12 % (ref 11.5–15.5)
WBC: 8.1 10*3/uL (ref 4.0–10.5)
nRBC: 0 % (ref 0.0–0.2)

## 2018-03-26 LAB — BASIC METABOLIC PANEL
Anion gap: 10 (ref 5–15)
BUN: 15 mg/dL (ref 8–23)
CO2: 26 mmol/L (ref 22–32)
Calcium: 8.7 mg/dL — ABNORMAL LOW (ref 8.9–10.3)
Chloride: 101 mmol/L (ref 98–111)
Creatinine, Ser: 1.07 mg/dL — ABNORMAL HIGH (ref 0.44–1.00)
GFR calc Af Amer: >60 mL/min (ref >60)
GFR calc non Af Amer: 53 mL/min — ABNORMAL LOW (ref >60)
Glucose, Bld: 278 mg/dL — ABNORMAL HIGH (ref 70–99)
Potassium: 4.4 mmol/L (ref 3.5–5.1)
Sodium: 137 mmol/L (ref 135–145)

## 2018-03-26 LAB — HEPARIN LEVEL (UNFRACTIONATED): Heparin Unfractionated: 0.36 IU/mL (ref 0.30–0.70)

## 2018-03-26 LAB — GLUCOSE, CAPILLARY
Glucose-Capillary: 249 mg/dL — ABNORMAL HIGH (ref 70–99)
Glucose-Capillary: 151 mg/dL — ABNORMAL HIGH (ref 70–99)
Glucose-Capillary: 315 mg/dL — ABNORMAL HIGH (ref 70–99)
Glucose-Capillary: 298 mg/dL — ABNORMAL HIGH (ref 70–99)

## 2018-03-26 LAB — ABO/RH: ABO/RH(D): O POS

## 2018-03-26 LAB — POCT ACTIVATED CLOTTING TIME: Activated Clotting Time: 164 seconds

## 2018-03-26 SURGERY — CREATION, BYPASS, ARTERIAL, FEMORAL TO PERONEAL, USING GRAFT
Anesthesia: General | Laterality: Right

## 2018-03-26 MED ORDER — HEPARIN (PORCINE) 25000 UT/250ML-% IV SOLN
500.0000 [IU]/h | INTRAVENOUS | Status: DC
  Administered 2018-03-26 – 2018-03-27 (×2): 500 [IU]/h via INTRAVENOUS
  Filled 2018-03-26 (×2): qty 250

## 2018-03-26 MED ORDER — CEFAZOLIN SODIUM 1 G IJ SOLR
INTRAMUSCULAR | Status: AC
  Filled 2018-03-26: qty 20

## 2018-03-26 MED ORDER — PROTAMINE SULFATE 10 MG/ML IV SOLN
INTRAVENOUS | Status: AC
  Filled 2018-03-26: qty 5

## 2018-03-26 MED ORDER — HYDROMORPHONE HCL 1 MG/ML IJ SOLN
0.5000 mg | INTRAMUSCULAR | Status: DC | PRN
  Administered 2018-03-27 – 2018-03-29 (×4): 1 mg via INTRAVENOUS
  Filled 2018-03-26 (×4): qty 1

## 2018-03-26 MED ORDER — MEPERIDINE HCL 50 MG/ML IJ SOLN: 6.2500 mg | INTRAMUSCULAR | Status: DC | PRN

## 2018-03-26 MED ORDER — SODIUM CHLORIDE 0.9 % IV SOLN
INTRAVENOUS | Status: DC | PRN
  Administered 2018-03-26: 20 ug/min via INTRAVENOUS

## 2018-03-26 MED ORDER — FENTANYL CITRATE (PF) 250 MCG/5ML IJ SOLN
INTRAMUSCULAR | Status: AC
  Filled 2018-03-26: qty 5

## 2018-03-26 MED ORDER — HYDROMORPHONE HCL 1 MG/ML IJ SOLN: 0.2500 mg | INTRAMUSCULAR | Status: DC | PRN

## 2018-03-26 MED ORDER — SODIUM CHLORIDE 0.9 % IV SOLN
500.0000 mL | Freq: Once | INTRAVENOUS | Status: AC | PRN
  Administered 2018-03-29: 10:00:00 via INTRAVENOUS

## 2018-03-26 MED ORDER — SODIUM CHLORIDE 0.9 % IV SOLN
INTRAVENOUS | Status: AC
  Filled 2018-03-26: qty 1.2

## 2018-03-26 MED ORDER — SODIUM CHLORIDE 0.9 % IV SOLN
INTRAVENOUS | Status: DC
  Administered 2018-03-26 – 2018-03-28 (×4): via INTRAVENOUS

## 2018-03-26 MED ORDER — PROPOFOL 10 MG/ML IV BOLUS
INTRAVENOUS | Status: AC
  Filled 2018-03-26: qty 20

## 2018-03-26 MED ORDER — PROTAMINE SULFATE 10 MG/ML IV SOLN
INTRAVENOUS | Status: DC | PRN
  Administered 2018-03-26: 30 mg via INTRAVENOUS

## 2018-03-26 MED ORDER — BISACODYL 5 MG PO TBEC
5.0000 mg | DELAYED_RELEASE_TABLET | Freq: Every day | ORAL | Status: DC | PRN
  Administered 2018-04-04: 5 mg via ORAL
  Filled 2018-03-26: qty 1

## 2018-03-26 MED ORDER — ESMOLOL HCL 100 MG/10ML IV SOLN: INTRAVENOUS | Status: DC | PRN

## 2018-03-26 MED ORDER — PHENYLEPHRINE 40 MCG/ML (10ML) SYRINGE FOR IV PUSH (FOR BLOOD PRESSURE SUPPORT)
PREFILLED_SYRINGE | INTRAVENOUS | Status: AC
  Filled 2018-03-26: qty 10

## 2018-03-26 MED ORDER — PROMETHAZINE HCL 25 MG/ML IJ SOLN: 6.2500 mg | INTRAMUSCULAR | Status: DC | PRN

## 2018-03-26 MED ORDER — ROCURONIUM BROMIDE 100 MG/10ML IV SOLN
INTRAVENOUS | Status: DC | PRN
  Administered 2018-03-26 (×2): 10 mg via INTRAVENOUS
  Administered 2018-03-26: 20 mg via INTRAVENOUS
  Administered 2018-03-26: 50 mg via INTRAVENOUS

## 2018-03-26 MED ORDER — HEPARIN SODIUM (PORCINE) 1000 UNIT/ML IJ SOLN
INTRAMUSCULAR | Status: AC
  Filled 2018-03-26: qty 1

## 2018-03-26 MED ORDER — LACTATED RINGERS IV SOLN
INTRAVENOUS | Status: DC
  Administered 2018-03-26 (×2): via INTRAVENOUS

## 2018-03-26 MED ORDER — INSULIN ASPART 100 UNIT/ML ~~LOC~~ SOLN
SUBCUTANEOUS | Status: AC
  Filled 2018-03-26: qty 1

## 2018-03-26 MED ORDER — PHENOL 1.4 % MT LIQD: 1.0000 | OROMUCOSAL | Status: DC | PRN

## 2018-03-26 MED ORDER — HEPARIN SODIUM (PORCINE) 1000 UNIT/ML IJ SOLN
INTRAMUSCULAR | Status: DC | PRN
  Administered 2018-03-26 (×2): 8000 [IU] via INTRAVENOUS
  Administered 2018-03-26: 5000 [IU] via INTRAVENOUS
  Administered 2018-03-26: 3000 [IU] via INTRAVENOUS

## 2018-03-26 MED ORDER — CEFAZOLIN SODIUM-DEXTROSE 2-4 GM/100ML-% IV SOLN
2.0000 g | Freq: Three times a day (TID) | INTRAVENOUS | Status: AC
  Administered 2018-03-27: 2 g via INTRAVENOUS
  Filled 2018-03-26 (×3): qty 100

## 2018-03-26 MED ORDER — FENTANYL CITRATE (PF) 100 MCG/2ML IJ SOLN
INTRAMUSCULAR | Status: DC | PRN
  Administered 2018-03-26: 100 ug via INTRAVENOUS
  Administered 2018-03-26 (×4): 50 ug via INTRAVENOUS
  Administered 2018-03-26: 100 ug via INTRAVENOUS
  Administered 2018-03-26: 50 ug via INTRAVENOUS
  Administered 2018-03-26 (×2): 100 ug via INTRAVENOUS
  Administered 2018-03-26: 50 ug via INTRAVENOUS

## 2018-03-26 MED ORDER — PROPOFOL 10 MG/ML IV BOLUS
INTRAVENOUS | Status: DC | PRN
  Administered 2018-03-26: 80 mg via INTRAVENOUS

## 2018-03-26 MED ORDER — IODIXANOL 320 MG/ML IV SOLN
INTRAVENOUS | Status: DC | PRN
  Administered 2018-03-26: 50 mL via INTRAVENOUS
  Administered 2018-03-26: 100 mL via INTRAVENOUS

## 2018-03-26 MED ORDER — HEPARIN SODIUM (PORCINE) 1000 UNIT/ML IJ SOLN
INTRAMUSCULAR | Status: AC
  Filled 2018-03-26: qty 2

## 2018-03-26 MED ORDER — ESMOLOL HCL 100 MG/10ML IV SOLN
INTRAVENOUS | Status: AC
  Filled 2018-03-26: qty 10

## 2018-03-26 MED ORDER — DEXAMETHASONE SODIUM PHOSPHATE 10 MG/ML IJ SOLN
INTRAMUSCULAR | Status: DC | PRN
  Administered 2018-03-26: 10 mg via INTRAVENOUS

## 2018-03-26 MED ORDER — SENNOSIDES-DOCUSATE SODIUM 8.6-50 MG PO TABS
1.0000 | ORAL_TABLET | Freq: Every evening | ORAL | Status: DC | PRN
  Administered 2018-04-04: 1 via ORAL
  Filled 2018-03-26 (×2): qty 1

## 2018-03-26 MED ORDER — PHENYLEPHRINE HCL 10 MG/ML IJ SOLN
INTRAMUSCULAR | Status: DC | PRN
  Administered 2018-03-26 (×3): 80 ug via INTRAVENOUS

## 2018-03-26 MED ORDER — MIDAZOLAM HCL 2 MG/2ML IJ SOLN: 0.5000 mg | Freq: Once | INTRAMUSCULAR | Status: DC | PRN

## 2018-03-26 MED ORDER — 0.9 % SODIUM CHLORIDE (POUR BTL) OPTIME
TOPICAL | Status: DC | PRN
  Administered 2018-03-26 (×3): 1000 mL

## 2018-03-26 MED ORDER — FLEET ENEMA 7-19 GM/118ML RE ENEM: 1.0000 | ENEMA | Freq: Once | RECTAL | Status: DC | PRN

## 2018-03-26 MED ORDER — LACTATED RINGERS IV SOLN
INTRAVENOUS | Status: DC | PRN
  Administered 2018-03-26: 12:00:00 via INTRAVENOUS

## 2018-03-26 MED ORDER — EPHEDRINE SULFATE 50 MG/ML IJ SOLN
INTRAMUSCULAR | Status: DC | PRN
  Administered 2018-03-26: 10 mg via INTRAVENOUS
  Administered 2018-03-26: 5 mg via INTRAVENOUS

## 2018-03-26 MED ORDER — CEFAZOLIN SODIUM-DEXTROSE 2-3 GM-%(50ML) IV SOLR
INTRAVENOUS | Status: DC | PRN
  Administered 2018-03-26 (×2): 2 g via INTRAVENOUS

## 2018-03-26 MED ORDER — CEFAZOLIN SODIUM-DEXTROSE 2-4 GM/100ML-% IV SOLN
INTRAVENOUS | Status: AC
  Filled 2018-03-26: qty 100

## 2018-03-26 MED ORDER — SODIUM CHLORIDE 0.9 % IV SOLN
INTRAVENOUS | Status: DC | PRN
  Administered 2018-03-26: 500 mL

## 2018-03-26 MED ORDER — ONDANSETRON HCL 4 MG/2ML IJ SOLN
INTRAMUSCULAR | Status: DC | PRN
  Administered 2018-03-26: 4 mg via INTRAVENOUS

## 2018-03-26 MED ORDER — LIDOCAINE HCL (CARDIAC) PF 100 MG/5ML IV SOSY
PREFILLED_SYRINGE | INTRAVENOUS | Status: DC | PRN
  Administered 2018-03-26: 40 mg via INTRAVENOUS

## 2018-03-26 MED ORDER — ROCURONIUM BROMIDE 50 MG/5ML IV SOSY
PREFILLED_SYRINGE | INTRAVENOUS | Status: AC
  Filled 2018-03-26: qty 5

## 2018-03-26 MED ORDER — INSULIN ASPART 100 UNIT/ML ~~LOC~~ SOLN: 7.0000 [IU] | Freq: Once | SUBCUTANEOUS | Status: AC

## 2018-03-26 MED ORDER — ESMOLOL HCL 100 MG/10ML IV SOLN
INTRAVENOUS | Status: DC | PRN
  Administered 2018-03-26: 20 mg via INTRAVENOUS
  Administered 2018-03-26: 30 mg via INTRAVENOUS
  Administered 2018-03-26: 20 mg via INTRAVENOUS

## 2018-03-26 SURGICAL SUPPLY — 66 items
ADH SKN CLS APL DERMABOND .7 (GAUZE/BANDAGES/DRESSINGS) ×3
BANDAGE ESMARK 6X9 LF (GAUZE/BANDAGES/DRESSINGS) IMPLANT
BLADE 10 SAFETY STRL DISP (BLADE) ×2 IMPLANT
BLADE 11 SAFETY STRL DISP (BLADE) ×2 IMPLANT
BNDG CMPR 9X6 STRL LF SNTH (GAUZE/BANDAGES/DRESSINGS)
BNDG ESMARK 6X9 LF (GAUZE/BANDAGES/DRESSINGS)
CANISTER SUCT 3000ML PPV (MISCELLANEOUS) ×3 IMPLANT
CANNULA VESSEL 3MM 2 BLNT TIP (CANNULA) IMPLANT
CATH EMB 2FR 60CM (CATHETERS) ×2 IMPLANT
CATH EMB 3FR 80CM (CATHETERS) ×2 IMPLANT
CATH EMB 4FR 40CM (CATHETERS) ×2 IMPLANT
CLIP VESOCCLUDE MED 24/CT (CLIP) ×3 IMPLANT
CLIP VESOCCLUDE SM WIDE 24/CT (CLIP) ×5 IMPLANT
CLIP VESOCCLUDE SM WIDE 6/CT (CLIP) ×2 IMPLANT
COVER WAND RF STERILE (DRAPES) ×3 IMPLANT
CUFF TOURNIQUET SINGLE 24IN (TOURNIQUET CUFF) IMPLANT
CUFF TOURNIQUET SINGLE 34IN LL (TOURNIQUET CUFF) IMPLANT
CUFF TOURNIQUET SINGLE 44IN (TOURNIQUET CUFF) IMPLANT
DERMABOND ADVANCED (GAUZE/BANDAGES/DRESSINGS) ×6
DERMABOND ADVANCED .7 DNX12 (GAUZE/BANDAGES/DRESSINGS) IMPLANT
DRAIN CHANNEL 15F RND FF W/TCR (WOUND CARE) IMPLANT
DRAPE C-ARM 42X72 X-RAY (DRAPES) ×2 IMPLANT
DRAPE HALF SHEET 40X57 (DRAPES) IMPLANT
ELECT REM PT RETURN 9FT ADLT (ELECTROSURGICAL) ×3
ELECTRODE REM PT RTRN 9FT ADLT (ELECTROSURGICAL) ×1 IMPLANT
EVACUATOR SILICONE 100CC (DRAIN) IMPLANT
GAUZE 4X4 16PLY RFD (DISPOSABLE) ×4 IMPLANT
GLOVE BIO SURGEON STRL SZ7.5 (GLOVE) ×5 IMPLANT
GLOVE SURG SS PI 6.5 STRL IVOR (GLOVE) ×2 IMPLANT
GOWN STRL REUS W/ TWL LRG LVL3 (GOWN DISPOSABLE) ×2 IMPLANT
GOWN STRL REUS W/ TWL XL LVL3 (GOWN DISPOSABLE) ×1 IMPLANT
GOWN STRL REUS W/TWL LRG LVL3 (GOWN DISPOSABLE) ×6
GOWN STRL REUS W/TWL XL LVL3 (GOWN DISPOSABLE) ×3
HEMOSTAT SNOW SURGICEL 2X4 (HEMOSTASIS) IMPLANT
INSERT FOGARTY SM (MISCELLANEOUS) IMPLANT
KIT BASIN OR (CUSTOM PROCEDURE TRAY) ×3 IMPLANT
KIT TURNOVER KIT B (KITS) ×3 IMPLANT
LOOP VESSEL MINI RED (MISCELLANEOUS) ×2 IMPLANT
MARKER GRAFT CORONARY BYPASS (MISCELLANEOUS) IMPLANT
NS IRRIG 1000ML POUR BTL (IV SOLUTION) ×6 IMPLANT
PACK PERIPHERAL VASCULAR (CUSTOM PROCEDURE TRAY) ×3 IMPLANT
PAD ARMBOARD 7.5X6 YLW CONV (MISCELLANEOUS) ×6 IMPLANT
SET COLLECT BLD 21X3/4 12 PB (MISCELLANEOUS) ×2 IMPLANT
SET MICROPUNCTURE 5F STIFF (MISCELLANEOUS) ×2 IMPLANT
STOPCOCK 4 WAY LG BORE MALE ST (IV SETS) ×2 IMPLANT
SUT ETHILON 3 0 PS 1 (SUTURE) IMPLANT
SUT MNCRL AB 4-0 PS2 18 (SUTURE) ×16 IMPLANT
SUT PROLENE 5 0 C 1 24 (SUTURE) ×13 IMPLANT
SUT PROLENE 6 0 BV (SUTURE) ×31 IMPLANT
SUT PROLENE 7 0 BV 1 (SUTURE) IMPLANT
SUT SILK 2 0 SH (SUTURE) ×3 IMPLANT
SUT SILK 3 0 (SUTURE) ×3
SUT SILK 3-0 18XBRD TIE 12 (SUTURE) IMPLANT
SUT VIC AB 2-0 CT1 27 (SUTURE) ×9
SUT VIC AB 2-0 CT1 TAPERPNT 27 (SUTURE) ×2 IMPLANT
SUT VIC AB 3-0 SH 27 (SUTURE) ×15
SUT VIC AB 3-0 SH 27X BRD (SUTURE) ×2 IMPLANT
SYR 20CC LL (SYRINGE) ×2 IMPLANT
SYR 3ML LL SCALE MARK (SYRINGE) ×2 IMPLANT
TAPE UMBILICAL COTTON 1/8X30 (MISCELLANEOUS) IMPLANT
TOWEL GREEN STERILE (TOWEL DISPOSABLE) ×3 IMPLANT
TRAY FOLEY MTR SLVR 16FR STAT (SET/KITS/TRAYS/PACK) ×3 IMPLANT
TUBING CIL FLEX 10 FLL-RA (TUBING) ×2 IMPLANT
TUBING EXTENTION W/L.L. (IV SETS) IMPLANT
UNDERPAD 30X30 (UNDERPADS AND DIAPERS) ×3 IMPLANT
WATER STERILE IRR 1000ML POUR (IV SOLUTION) ×3 IMPLANT

## 2018-03-26 NOTE — Transfer of Care (Signed)
Immediate Anesthesia Transfer of Care Note  Patient: Emma [name]  Procedure(s) Performed: BYPASS GRAFT FEMORAL-PERONEAL (Right ) Vein Harvest of Right Leg Greater Saphenous Vein in SITU (Right ) Intra Operative Arteriogram (Right ) PATCH ANGIOPLASTY (Right )  Patient Location: PACU  Anesthesia Type:General  Level of Consciousness: responds to stimulation  Airway & Oxygen Therapy: Patient Spontanous Breathing and Patient connected to nasal cannula oxygen  Post-op Assessment: Report given to RN, Post -op Vital signs reviewed and stable and Patient moving all extremities  Post vital signs: Reviewed and stable  Last Vitals:  Vitals Value Taken Time  BP 135/71 03/26/2018  5:29 PM  Temp    Pulse 127 03/26/2018  5:36 PM  Resp 29 03/26/2018  5:36 PM  SpO2 97 % 03/26/2018  5:36 PM  Vitals shown include unvalidated device data.  Last Pain:  Vitals:   03/26/18 0730  TempSrc:   PainSc: Asleep      Patients Stated Pain Goal: 0 (03/26/18 0600)  Complications: No apparent anesthesia complications

## 2018-03-26 NOTE — Anesthesia Postprocedure Evaluation (Signed)
Anesthesia Post Note  Patient: Emma [name]  Procedure(s) Performed: BYPASS GRAFT FEMORAL-PERONEAL (Right ) Vein Harvest of Right Leg Greater Saphenous Vein in SITU (Right ) Intra Operative Arteriogram (Right ) PATCH ANGIOPLASTY (Right )     Patient location during evaluation: PACU Anesthesia Type: General Level of consciousness: responds to stimulation Pain management: pain level controlled Vital Signs Assessment: post-procedure vital signs reviewed and stable Respiratory status: spontaneous breathing, nonlabored ventilation, respiratory function stable and patient connected to nasal cannula oxygen Cardiovascular status: blood pressure returned to baseline and stable Postop Assessment: no apparent nausea or vomiting Anesthetic complications: no    Last Vitals:  Vitals:   03/26/18 1930 03/26/18 2000  BP: 132/66 114/69  Pulse:  (!) 102  Resp: 16 16  Temp: (!) 36.4 C 37.2 C  SpO2:  100%    Last Pain:  Vitals:   03/26/18 2000  TempSrc: Oral  PainSc:                  Catheryn Bacon Brinklee Cisse

## 2018-03-26 NOTE — Progress Notes (Signed)
Dr Chilton Si & Dr Bradley Ferris at bedside. Pt more responsive, ST 100, BP stable.

## 2018-03-26 NOTE — Op Note (Signed)
Patient name: Emma [name] MRN: [id number] DOB: 30-Aug-1945 Sex: female  03/26/2018 Pre-operative Diagnosis: Critical right lower extremity ischemia Post-operative diagnosis:  Same Surgeon:  Apolinar Junes C. Randie Heinz, MD Assistants: Clinton Gallant, PA; Durene Cal, MD Procedure Performed: 1.  Right iliofemoral thromboembolectomy 2.  Harvest right greater saphenous vein 3.  Exposure right peroneal artery 4.  Right common femoral to posterior tibial artery bypass with in situ greater saphenous vein 5.  Right lower extremity angiogram 6.  Thromboembolectomy right lower extremity bypass graft and posterior tibial artery 7.  Patch angioplasty right posterior tibial artery with greater saphenous vein  Indications: 73 year old female with recent history of right lower extremity critical limb ischemia with rest pain.  She has been started on a heparin drip with some improvement in her symptoms.  She does not have any motor or sensory deficits but does have extreme pain at rest.  She is undergone angiography which demonstrates peroneal artery runoff giving rise to a posterior tibial at the ankle seems to have suitable greater saphenous vein for bypass.  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  There was strong pulsation in the common femoral artery which was free of flow-limiting disease.  Greater saphenous vein was actually large except for one area in the mid calf which was dilated to approximately 3 mm at completion.  We are able to harvest the greater saphenous vein all the way to the ankle.  Peroneal artery in the mid calf did appear to be calcified I elected to bypass the posterior tibial artery at the ankle.  After initial bypass the distal artery occluded had to be thrombectomized distally and proximally.  I then performed end-to-end bypass to the posterior tibial but again occluded.  Repeat thrombus embolectomy was performed as well as right lower  extremity angiogram which demonstrated outflow issue.  There was no identifiable retained valve or issue with the vein.  We then extended arteriotomy down the posterior tibial artery several centimeters perform vein patch angioplasty.  At completion there was multiphasic signal in the runoff artery.  This bypass occludes she will need consideration of bypass to the mid calf peroneal artery.   Procedure:  The patient was identified in the holding area and taken to the operating room where she is placed upon the operative table general anesthesia induced.  She was sterilely prepped draped the right lower extremity usual fashion antibiotics were minister and a timeout was called.  We began with oblique incision in the right groin overlying the palpable common femoral pulse we dissected down onto the common femoral artery placed a vessel loop around this.  We then placed Vesseloops around large branches as well as the profunda femoris artery as well.  Identified the greater saphenous vein from within this wound.  I did dissected out branches divided in between clips and ties.  I then made 2 separate incisions to the level of the knee dissecting out the vein although leaving it in its bed and clipping branches with small clips.  Below the knee I used ultrasound to identify the vein made an incision overlying this.  Through to skip incisions I was able to dissect out the entire to the vein which was actually quite large all the way to the ankle.  Through the most cephalad incision I then dissected down through the fascia took the muscle down off the tibia into the deep compartment identified our peroneal vein first.  I did have to divide 1  of the peroneal veins.  Identified the peroneal artery which was noted to be quite calcified.  Given the length of vein that we had I then dissected through the deep fascia and our distal incision.  Identified our posterior tibial artery and veins.  This appeared to be more amenable  to bypass.  I then transected my vein distally.  I flushed with heparinized saline and clamped it.  Patient was fully heparinized at this time.  I turned my attention to the groin.  I clamped our outflow followed by our inflow.  I performed arteriotomy and actually transected the SFA and tied this off.  I then passed a 4 Fogarty returned what appeared to be chronic appearing thrombus with 1 pass the second pass was clear.  I then clamped my greater saphenous vein at the saphenofemoral junction and divided it.  I spatulated the vein there remove the first valve.  I oversewed the saphenofemoral junction with 5-0 Prolene suture in a running mattress fashion.  After the vein was spatulated I then sewed it and to end to the distal common femoral artery where the SFA had been transected with 5-0 Prolene suture.  Upon completion I released my clamps had good pulsation proximally.  I then identified a small branch in my for skip incision and divided this.  I was able to lyse valves with valvulotome through this and then tied off the branch.  I moved distally to another branch just above the knee and further lyse valves until I had pulsatile inflow to the level of the knee and then tied off this branch.  I was then able to go all the way distally to the vein where it was transected and tied off with 2-0 silk distally.  I passed a valvulotome until I had pulsatile inflow.  Branches below the knee were divided between clips and ties.  I flushed by vein and then marked it for orientation and tunneled it below the knee from one incision to the next in the deep space.  I then placed bulldog clamps on my posterior tibial artery distally and proximally and opened it longitudinally.  It did appear to have good backbleeding and I was easily able to pass a 2-0 dilator distally.  I then spatulated my vein trimmed to size and sewn end-to-side with 6-0 Prolene suture.  Prior to completion anastomosis we allowed flushing in all  directions.  Upon completion we did have a good signal in the distal posterior tibial artery and in her bypass graft.  With this we administered 50 mg of protamine obtain hemostasis in all wounds and closed the mall in layers with Vicryl Monocryl.  Upon completion of the distal suture line I noticed there was no further signal.  I reopen my distal incision.  There was no pulsation in the graft distally.  Patient was fully heparinized.  I clamped it open my entire anastomosis.  We passed a 3 Fogarty twice proximally until we had again pulsatile inflow.  A 2 Fogarty was passed distally to 30 cm this time we had good backbleeding.  I transected my posterior tibial artery and satisfied with my inflow and outflow I then sewed end-to-end with 6-0 Prolene suture and again prior to completion we allowed flushing.  After this we began closing the incision again noticed no signal.  For a second time our anastomosis was taken down the patient was fully heparinized again.  Again we passed Fogarty distally and proximally.  We again set our  anastomosis with 6-0 Prolene but did not complete it.  We then cannulated the common femoral artery performed right lower extremity angiogram.  The vein appeared to have no AV fistulas there were no retained valves impeding flow.  Distally appeared to be an outflow issue.  With this I remove the cannula in the common femoral artery and oversewed with 6-0 Prolene suture.  I then passed a cannula distally down the posterior tibial artery and injected contrast and did appear to have an outflow issue.  We did completed our anastomosis and then dissected out a posterior tibial artery for several centimeters where it appeared normal on angiography.  A new arteriotomy was performed there appeared to be some dissection plane and endarterectomy was performed.  We took another piece of our vein and spatulated it and sewed it in place with 6-0 Prolene.  Prior to completion anastomosis we made sure we had  good inflow from our bypass into our patch angioplasty site.  Upon completion there was very strong pulsation there was a good signal suggesting good outflow.  I elected not to perform another angiogram.  Patient was not reversed with protamine this time.  I obtained hemostasis irrigated the wounds closed in layers of Vicryl and Monocryl and Dermabond placed to level the skin.  Patient was then awakened from anesthesia having tolerated procedure well without immediate complication initially.  She is transferred the PACU in stable condition.  EBL: 500 cc   Cristen Murcia C. Randie Heinz, MD [facility] [place] Office: [phone] Pager: [phone]

## 2018-03-26 NOTE — Progress Notes (Signed)
On adm to PACU pt staring straight ahead, not verbal or following commands, BP stable, ST 130, CBG 315, pt diaphatoric. Dr Bradley Ferris updated by CRNA, came to bedside, IV Esmolol by MDA. Insulin given as ordered. Dr Randie Heinz also at bedside and aware of LOC. Will continue to monitor.

## 2018-03-26 NOTE — Progress Notes (Signed)
[facility] [place]  Subjective  - No complaints.  Ready for surgery.   Objective 135/76 68 98.5 F (36.9 C) (Oral) 13 97%  Intake/Output Summary (Last 24 hours) at 03/26/2018 0842 Last data filed at 03/26/2018 0316 Gross per 24 hour  Intake 405.29 ml  Output -  Net 405.29 ml    Bilateral femoral pulses palpable Left DP/PT monophasic signals Right peroneal monophasic signal Motor and sensory intact No tissue loss  Laboratory Lab Results: Recent Labs    03/25/18 0253 03/26/18 0305  WBC 7.8 8.1  HGB 10.0* 9.8*  HCT 32.0* 30.6*  PLT 273 293   BMET Recent Labs    03/25/18 0253 03/26/18 0305  NA 138 137  K 4.2 4.4  CL 102 101  CO2 26 26  GLUCOSE 251* 278*  BUN 14 15  CREATININE 1.06* 1.07*  CALCIUM 8.6* 8.7*    COAG Lab Results  Component Value Date   INR 1.0 03/25/2018   No results found for: PTT  Assessment/Planning: Aortogram/arteriogram yesterday with Dr. Randie Heinz.  CLi with rest pain in right foot.  Plan for right leg fem distal bypass (peroneal likely) today with Dr. Randie Heinz.  Patient very pleasant today and ready to proceed with surgery.  Cephus Shelling 03/26/2018 8:42 AM --

## 2018-03-26 NOTE — Anesthesia Procedure Notes (Signed)
Procedure Name: Intubation Date/Time: 03/26/2018 10:54 AM Performed by: Kmari Halter T, CRNA Pre-anesthesia Checklist: Patient identified, Emergency Drugs available, Suction available and Patient being monitored Patient Re-evaluated:Patient Re-evaluated prior to induction Oxygen Delivery Method: Circle system utilized Preoxygenation: Pre-oxygenation with 100% oxygen Induction Type: IV induction Ventilation: Mask ventilation without difficulty Laryngoscope Size: Miller and 2 Grade View: Grade I Tube type: Oral Tube size: 7.5 mm Number of attempts: 1 Airway Equipment and Method: Stylet and Patient positioned with wedge pillow Placement Confirmation: ETT inserted through vocal cords under direct vision,  positive ETCO2 and breath sounds checked- equal and bilateral Secured at: 22 cm Tube secured with: Tape Dental Injury: Teeth and Oropharynx as per pre-operative assessment

## 2018-03-26 NOTE — Progress Notes (Signed)
Notified by CRNA of elevated heart rate and blood pressure. Heart rate treated with esmolol and blood sugar treated with Novolog. Patient with decreased responsiveness on arrival to PACU, now responds to commands. IStat 7 ordered.

## 2018-03-26 NOTE — Progress Notes (Signed)
Narda Amber PA updated by phone re pt status, LOC improving, ABG results. No new orders. Will continue to monitor.

## 2018-03-26 NOTE — Anesthesia Preprocedure Evaluation (Addendum)
Anesthesia Evaluation  Patient identified by MRN, date of birth, ID band Patient awake    Reviewed: Allergy & Precautions, NPO status , Patient's Chart, lab work & pertinent test results  History of Anesthesia Complications Negative for: history of anesthetic complications  Airway Mallampati: III  TM Distance: >3 FB Neck ROM: Full    Dental  (+) Edentulous Upper, Edentulous Lower   Pulmonary Current Smoker,    breath sounds clear to auscultation       Cardiovascular hypertension, Pt. on medications (-) angina+ Peripheral Vascular Disease   Rhythm:Regular Rate:Normal     Neuro/Psych negative neurological ROS     GI/Hepatic negative GI ROS, Neg liver ROS,   Endo/Other  diabetes (Glu 151), Oral Hypoglycemic AgentsHypothyroidism   Renal/GU negative Renal ROS     Musculoskeletal   Abdominal   Peds  Hematology  (+) Blood dyscrasia (Hb 9.8), ,   Anesthesia Other Findings   Reproductive/Obstetrics                            Anesthesia Physical Anesthesia Plan  ASA: III  Anesthesia Plan: General   Post-op Pain Management:    Induction: Intravenous  PONV Risk Score and Plan: 2 and Ondansetron and Dexamethasone  Airway Management Planned: Oral ETT  Additional Equipment:   Intra-op Plan:   Post-operative Plan: Extubation in OR  Informed Consent: I have reviewed the patients History and Physical, chart, labs and discussed the procedure including the risks, benefits and alternatives for the proposed anesthesia with the patient or authorized representative who has indicated his/her understanding and acceptance.       Plan Discussed with: CRNA and Surgeon  Anesthesia Plan Comments:        Anesthesia Quick Evaluation

## 2018-03-26 NOTE — Progress Notes (Signed)
Pt transporting to OR at this time. Report given to short stay. All questions answered. Heparin drip to continue infusing.

## 2018-03-26 NOTE — Progress Notes (Signed)
  Progress Note    03/26/2018 9:55 AM Day of Surgery  Subjective: right foot hurting  Vitals:   03/25/18 2100 03/26/18 0316  BP: (!) 110/52 135/76  Pulse: 80 68  Resp: 18 13  Temp: 98.3 F (36.8 C) 98.5 F (36.9 C)  SpO2: 98% 97%    Physical Exam: aaox3 Palpable bilateral femoral pulses Right foot is cool to touch relative to left. No mottling. Sensation and motor in tact left foot  CBC    Component Value Date/Time   WBC 8.1 03/26/2018 0305   RBC 3.22 (L) 03/26/2018 0305   HGB 9.8 (L) 03/26/2018 0305   HCT 30.6 (L) 03/26/2018 0305   PLT 293 03/26/2018 0305   MCV 95.0 03/26/2018 0305   MCH 30.4 03/26/2018 0305   MCHC 32.0 03/26/2018 0305   RDW 12.0 03/26/2018 0305    BMET    Component Value Date/Time   NA 137 03/26/2018 0305   K 4.4 03/26/2018 0305   CL 101 03/26/2018 0305   CO2 26 03/26/2018 0305   GLUCOSE 278 (H) 03/26/2018 0305   BUN 15 03/26/2018 0305   CREATININE 1.07 (H) 03/26/2018 0305   CALCIUM 8.7 (L) 03/26/2018 0305   GFRNONAA 53 (L) 03/26/2018 0305   GFRAA >60 03/26/2018 0305    INR    Component Value Date/Time   INR 1.0 03/25/2018 0253     Intake/Output Summary (Last 24 hours) at 03/26/2018 0955 Last data filed at 03/26/2018 0316 Gross per 24 hour  Intake 405.29 ml  Output -  Net 405.29 ml     Assessment:  74 y.o. female is here with left foot critical limb ischemia that appears to acute on chronic  Plan: OR today for right femoral to peroneal artery bypass   Lyndsie Wallman C. Randie Heinz, MD [facility] Office: [phone] Pager: [phone]  03/26/2018 9:55 AM

## 2018-03-27 ENCOUNTER — Encounter (HOSPITAL_COMMUNITY)
Admission: EM | Disposition: A | Discharge status: Skilled Nursing Facility | Payer: Self-pay | Source: Home / Self Care | Attending: Vascular Surgery

## 2018-03-27 ENCOUNTER — Inpatient Hospital Stay (HOSPITAL_COMMUNITY): Payer: Medicare HMO | Admitting: Certified Registered Nurse Anesthetist

## 2018-03-27 ENCOUNTER — Encounter (HOSPITAL_COMMUNITY): Payer: Self-pay | Admitting: Vascular Surgery

## 2018-03-27 ENCOUNTER — Inpatient Hospital Stay (HOSPITAL_COMMUNITY): Payer: Medicare HMO

## 2018-03-27 DIAGNOSIS — T82856A Stenosis of peripheral vascular stent, initial encounter: Secondary | ICD-10-CM

## 2018-03-27 HISTORY — PX: FEMORAL-POPLITEAL BYPASS GRAFT: SHX937

## 2018-03-27 LAB — CBC
HCT: 24.2 % — ABNORMAL LOW (ref 36.0–46.0)
Hemoglobin: 7.8 g/dL — ABNORMAL LOW (ref 12.0–15.0)
MCH: 30.7 pg (ref 26.0–34.0)
MCHC: 32.2 g/dL (ref 30.0–36.0)
MCV: 95.3 fL (ref 80.0–100.0)
Platelets: 311 10*3/uL (ref 150–400)
RBC: 2.54 MIL/uL — ABNORMAL LOW (ref 3.87–5.11)
RDW: 12.4 % (ref 11.5–15.5)
WBC: 8.7 10*3/uL (ref 4.0–10.5)
nRBC: 0 % (ref 0.0–0.2)

## 2018-03-27 LAB — GLUCOSE, CAPILLARY
Glucose-Capillary: 234 mg/dL — ABNORMAL HIGH (ref 70–99)
Glucose-Capillary: 236 mg/dL — ABNORMAL HIGH (ref 70–99)
Glucose-Capillary: 245 mg/dL — ABNORMAL HIGH (ref 70–99)
Glucose-Capillary: 183 mg/dL — ABNORMAL HIGH (ref 70–99)

## 2018-03-27 LAB — BASIC METABOLIC PANEL
ANION GAP: 11 (ref 5–15)
BUN: 11 mg/dL (ref 8–23)
CO2: 24 mmol/L (ref 22–32)
Calcium: 8.4 mg/dL — ABNORMAL LOW (ref 8.9–10.3)
Chloride: 105 mmol/L (ref 98–111)
Creatinine, Ser: 0.99 mg/dL (ref 0.44–1.00)
GFR calc Af Amer: >60 mL/min (ref >60)
GFR calc non Af Amer: 58 mL/min — ABNORMAL LOW (ref >60)
GLUCOSE: 252 mg/dL — AB (ref 70–99)
POTASSIUM: 4.4 mmol/L (ref 3.5–5.1)
Sodium: 140 mmol/L (ref 135–145)

## 2018-03-27 LAB — APTT: aPTT: >200 seconds (ref 24–36)

## 2018-03-27 LAB — PREPARE RBC (CROSSMATCH)

## 2018-03-27 SURGERY — BYPASS GRAFT FEMORAL-POPLITEAL ARTERY
Anesthesia: General | Site: Leg Lower | Laterality: Right

## 2018-03-27 MED ORDER — PROPOFOL 10 MG/ML IV BOLUS
INTRAVENOUS | Status: AC
  Filled 2018-03-27: qty 20

## 2018-03-27 MED ORDER — SODIUM CHLORIDE 0.9 % IV SOLN
INTRAVENOUS | Status: DC | PRN
  Administered 2018-03-27: 25 ug/min via INTRAVENOUS

## 2018-03-27 MED ORDER — LIDOCAINE 2% (20 MG/ML) 5 ML SYRINGE
INTRAMUSCULAR | Status: AC
  Filled 2018-03-27: qty 5

## 2018-03-27 MED ORDER — ROCURONIUM BROMIDE 10 MG/ML (PF) SYRINGE
PREFILLED_SYRINGE | INTRAVENOUS | Status: DC | PRN
  Administered 2018-03-27: 50 mg via INTRAVENOUS

## 2018-03-27 MED ORDER — HEPARIN SODIUM (PORCINE) 1000 UNIT/ML IJ SOLN
INTRAMUSCULAR | Status: AC
  Filled 2018-03-27: qty 1

## 2018-03-27 MED ORDER — ALBUMIN HUMAN 5 % IV SOLN
INTRAVENOUS | Status: DC | PRN
  Administered 2018-03-27: 13:00:00 via INTRAVENOUS

## 2018-03-27 MED ORDER — FENTANYL CITRATE (PF) 100 MCG/2ML IJ SOLN
INTRAMUSCULAR | Status: AC
  Filled 2018-03-27: qty 2

## 2018-03-27 MED ORDER — ROCURONIUM BROMIDE 50 MG/5ML IV SOSY
PREFILLED_SYRINGE | INTRAVENOUS | Status: AC
  Filled 2018-03-27: qty 5

## 2018-03-27 MED ORDER — SODIUM CHLORIDE (PF) 0.9 % IJ SOLN
INTRAVENOUS | Status: DC | PRN
  Administered 2018-03-27: 100 mL via INTRAMUSCULAR

## 2018-03-27 MED ORDER — LIDOCAINE 2% (20 MG/ML) 5 ML SYRINGE
INTRAMUSCULAR | Status: DC | PRN
  Administered 2018-03-27: 100 mg via INTRAVENOUS

## 2018-03-27 MED ORDER — SODIUM CHLORIDE 0.9% IV SOLUTION: Freq: Once | INTRAVENOUS | Status: DC

## 2018-03-27 MED ORDER — SODIUM CHLORIDE 0.9 % IV SOLN
INTRAVENOUS | Status: DC | PRN
  Administered 2018-03-27: 500 mL

## 2018-03-27 MED ORDER — MIDAZOLAM HCL 2 MG/2ML IJ SOLN
INTRAMUSCULAR | Status: AC
  Filled 2018-03-27: qty 2

## 2018-03-27 MED ORDER — SUGAMMADEX SODIUM 200 MG/2ML IV SOLN
INTRAVENOUS | Status: DC | PRN
  Administered 2018-03-27: 140.8 mg via INTRAVENOUS

## 2018-03-27 MED ORDER — SODIUM CHLORIDE 0.9 % IV SOLN
INTRAVENOUS | Status: AC
  Filled 2018-03-27: qty 1.2

## 2018-03-27 MED ORDER — FENTANYL CITRATE (PF) 250 MCG/5ML IJ SOLN
INTRAMUSCULAR | Status: AC
  Filled 2018-03-27: qty 5

## 2018-03-27 MED ORDER — HEPARIN SODIUM (PORCINE) 1000 UNIT/ML IJ SOLN
INTRAMUSCULAR | Status: DC | PRN
  Administered 2018-03-27: 4000 [IU] via INTRAVENOUS
  Administered 2018-03-27: 5000 [IU] via INTRAVENOUS
  Administered 2018-03-27: 8000 [IU] via INTRAVENOUS

## 2018-03-27 MED ORDER — FENTANYL CITRATE (PF) 100 MCG/2ML IJ SOLN
25.0000 ug | INTRAMUSCULAR | Status: DC | PRN
  Administered 2018-03-27: 25 ug via INTRAVENOUS

## 2018-03-27 MED ORDER — LIDOCAINE HCL (PF) 1 % IJ SOLN
INTRAMUSCULAR | Status: AC
  Filled 2018-03-27: qty 30

## 2018-03-27 MED ORDER — INSULIN ASPART 100 UNIT/ML ~~LOC~~ SOLN
5.0000 [IU] | Freq: Once | SUBCUTANEOUS | Status: AC
  Administered 2018-03-27: 5 [IU] via SUBCUTANEOUS

## 2018-03-27 MED ORDER — SODIUM CHLORIDE (PF) 0.9 % IJ SOLN
INTRAVENOUS | Status: DC | PRN
  Administered 2018-03-27: 50 mL via INTRAMUSCULAR

## 2018-03-27 MED ORDER — FENTANYL CITRATE (PF) 250 MCG/5ML IJ SOLN
INTRAMUSCULAR | Status: DC | PRN
  Administered 2018-03-27: 100 ug via INTRAVENOUS
  Administered 2018-03-27 (×5): 50 ug via INTRAVENOUS

## 2018-03-27 MED ORDER — ONDANSETRON HCL 4 MG/2ML IJ SOLN
INTRAMUSCULAR | Status: AC
  Filled 2018-03-27: qty 2

## 2018-03-27 MED ORDER — PROMETHAZINE HCL 25 MG/ML IJ SOLN: 6.2500 mg | INTRAMUSCULAR | Status: DC | PRN

## 2018-03-27 MED ORDER — PHENYLEPHRINE 40 MCG/ML (10ML) SYRINGE FOR IV PUSH (FOR BLOOD PRESSURE SUPPORT)
PREFILLED_SYRINGE | INTRAVENOUS | Status: AC
  Filled 2018-03-27: qty 20

## 2018-03-27 MED ORDER — MEPERIDINE HCL 50 MG/ML IJ SOLN: 6.2500 mg | INTRAMUSCULAR | Status: DC | PRN

## 2018-03-27 MED ORDER — PROPOFOL 10 MG/ML IV BOLUS
INTRAVENOUS | Status: DC | PRN
  Administered 2018-03-27: 140 mg via INTRAVENOUS

## 2018-03-27 MED ORDER — LACTATED RINGERS IV SOLN
INTRAVENOUS | Status: DC
  Administered 2018-03-27 (×2): via INTRAVENOUS

## 2018-03-27 MED ORDER — INSULIN ASPART 100 UNIT/ML ~~LOC~~ SOLN
SUBCUTANEOUS | Status: AC
  Administered 2018-03-27: 5 [IU] via SUBCUTANEOUS
  Filled 2018-03-27: qty 1

## 2018-03-27 MED ORDER — ONDANSETRON HCL 4 MG/2ML IJ SOLN
INTRAMUSCULAR | Status: DC | PRN
  Administered 2018-03-27: 4 mg via INTRAVENOUS

## 2018-03-27 MED ORDER — 0.9 % SODIUM CHLORIDE (POUR BTL) OPTIME
TOPICAL | Status: DC | PRN
  Administered 2018-03-27: 2000 mL

## 2018-03-27 MED ORDER — PHENYLEPHRINE 40 MCG/ML (10ML) SYRINGE FOR IV PUSH (FOR BLOOD PRESSURE SUPPORT)
PREFILLED_SYRINGE | INTRAVENOUS | Status: DC | PRN
  Administered 2018-03-27 (×2): 120 ug via INTRAVENOUS
  Administered 2018-03-27 (×2): 80 ug via INTRAVENOUS
  Administered 2018-03-27: 40 ug via INTRAVENOUS
  Administered 2018-03-27: 80 ug via INTRAVENOUS

## 2018-03-27 SURGICAL SUPPLY — 79 items
ADH SKN CLS APL DERMABOND .7 (GAUZE/BANDAGES/DRESSINGS) ×1
BALL STERLING OTW 2.5X150X150 (BALLOONS) ×2
BALLN STERLING OTW 2.5X150X150 (BALLOONS) ×1
BALLN STERLING OTW 3X150X150 (BALLOONS) ×3
BALLOON STERLING OTW 3X150X150 (BALLOONS) IMPLANT
BALLOON STRLNG OTW 2.5X150X150 (BALLOONS) IMPLANT
BANDAGE ESMARK 6X9 LF (GAUZE/BANDAGES/DRESSINGS) IMPLANT
BNDG CMPR 9X6 STRL LF SNTH (GAUZE/BANDAGES/DRESSINGS)
BNDG ESMARK 6X9 LF (GAUZE/BANDAGES/DRESSINGS)
CANISTER SUCT 3000ML PPV (MISCELLANEOUS) ×3 IMPLANT
CANNULA VESSEL 3MM 2 BLNT TIP (CANNULA) IMPLANT
CATH EMB 2FR 60CM (CATHETERS) ×2 IMPLANT
CATH EMB 3FR 80CM (CATHETERS) ×2 IMPLANT
CLIP VESOCCLUDE MED 24/CT (CLIP) ×3 IMPLANT
CLIP VESOCCLUDE SM WIDE 24/CT (CLIP) ×3 IMPLANT
COVER WAND RF STERILE (DRAPES) ×1 IMPLANT
CUFF TOURNIQUET SINGLE 24IN (TOURNIQUET CUFF) IMPLANT
CUFF TOURNIQUET SINGLE 34IN LL (TOURNIQUET CUFF) IMPLANT
CUFF TOURNIQUET SINGLE 44IN (TOURNIQUET CUFF) IMPLANT
DERMABOND ADVANCED (GAUZE/BANDAGES/DRESSINGS) ×2
DERMABOND ADVANCED .7 DNX12 (GAUZE/BANDAGES/DRESSINGS) ×1 IMPLANT
DRAIN CHANNEL 15F RND FF W/TCR (WOUND CARE) IMPLANT
DRAPE C-ARM 42X72 X-RAY (DRAPES) IMPLANT
DRAPE HALF SHEET 40X57 (DRAPES) IMPLANT
DRAPE X-RAY CASS 24X20 (DRAPES) IMPLANT
DRSG COVADERM 4X6 (GAUZE/BANDAGES/DRESSINGS) ×2 IMPLANT
DRSG COVADERM 4X8 (GAUZE/BANDAGES/DRESSINGS) ×2 IMPLANT
ELECT REM PT RETURN 9FT ADLT (ELECTROSURGICAL) ×3
ELECTRODE REM PT RTRN 9FT ADLT (ELECTROSURGICAL) ×1 IMPLANT
EVACUATOR SILICONE 100CC (DRAIN) IMPLANT
GAUZE 4X4 16PLY RFD (DISPOSABLE) ×2 IMPLANT
GLOVE BIO SURGEON STRL SZ7.5 (GLOVE) ×5 IMPLANT
GLOVE BIOGEL PI IND STRL 6.5 (GLOVE) IMPLANT
GLOVE BIOGEL PI IND STRL 7.0 (GLOVE) IMPLANT
GLOVE BIOGEL PI IND STRL 7.5 (GLOVE) IMPLANT
GLOVE BIOGEL PI INDICATOR 6.5 (GLOVE) ×4
GLOVE BIOGEL PI INDICATOR 7.0 (GLOVE) ×10
GLOVE BIOGEL PI INDICATOR 7.5 (GLOVE) ×2
GLOVE ECLIPSE 7.0 STRL STRAW (GLOVE) ×2 IMPLANT
GLOVE SURG SS PI 6.5 STRL IVOR (GLOVE) ×2 IMPLANT
GOWN STRL REUS W/ TWL LRG LVL3 (GOWN DISPOSABLE) ×2 IMPLANT
GOWN STRL REUS W/ TWL XL LVL3 (GOWN DISPOSABLE) ×1 IMPLANT
GOWN STRL REUS W/TWL LRG LVL3 (GOWN DISPOSABLE) ×9
GOWN STRL REUS W/TWL XL LVL3 (GOWN DISPOSABLE) ×3
HEMOSTAT SNOW SURGICEL 2X4 (HEMOSTASIS) IMPLANT
INSERT FOGARTY SM (MISCELLANEOUS) IMPLANT
KIT BASIN OR (CUSTOM PROCEDURE TRAY) ×3 IMPLANT
KIT ENCORE 26 ADVANTAGE (KITS) ×2 IMPLANT
KIT TURNOVER KIT B (KITS) ×3 IMPLANT
MARKER GRAFT CORONARY BYPASS (MISCELLANEOUS) IMPLANT
NS IRRIG 1000ML POUR BTL (IV SOLUTION) ×6 IMPLANT
PACK PERIPHERAL VASCULAR (CUSTOM PROCEDURE TRAY) ×3 IMPLANT
PAD ARMBOARD 7.5X6 YLW CONV (MISCELLANEOUS) ×6 IMPLANT
SET COLLECT BLD 21X3/4 12 (NEEDLE) IMPLANT
SET MICROPUNCTURE 5F STIFF (MISCELLANEOUS) ×2 IMPLANT
SHEATH PINNACLE 5F 10CM (SHEATH) ×2 IMPLANT
STAPLER VISISTAT (STAPLE) ×6 IMPLANT
STOPCOCK 4 WAY LG BORE MALE ST (IV SETS) ×4 IMPLANT
SUT ETHILON 3 0 PS 1 (SUTURE) IMPLANT
SUT MNCRL AB 4-0 PS2 18 (SUTURE) ×4 IMPLANT
SUT PROLENE 5 0 C 1 24 (SUTURE) ×3 IMPLANT
SUT PROLENE 6 0 BV (SUTURE) ×15 IMPLANT
SUT PROLENE 7 0 BV 1 (SUTURE) IMPLANT
SUT SILK 2 0 PERMA HAND 18 BK (SUTURE) ×2 IMPLANT
SUT SILK 2 0 SH (SUTURE) ×3 IMPLANT
SUT SILK 3 0 (SUTURE) ×3
SUT SILK 3-0 18XBRD TIE 12 (SUTURE) IMPLANT
SUT VIC AB 2-0 CT1 27 (SUTURE) ×15
SUT VIC AB 2-0 CT1 TAPERPNT 27 (SUTURE) ×2 IMPLANT
SUT VIC AB 3-0 SH 27 (SUTURE) ×6
SUT VIC AB 3-0 SH 27X BRD (SUTURE) ×2 IMPLANT
SYR 30ML LL (SYRINGE) ×2 IMPLANT
SYRINGE 3CC LL L/F (MISCELLANEOUS) ×4 IMPLANT
TOWEL GREEN STERILE (TOWEL DISPOSABLE) ×3 IMPLANT
TRAY FOLEY MTR SLVR 16FR STAT (SET/KITS/TRAYS/PACK) ×3 IMPLANT
TUBING EXTENTION W/L.L. (IV SETS) ×2 IMPLANT
UNDERPAD 30X30 (UNDERPADS AND DIAPERS) ×3 IMPLANT
WATER STERILE IRR 1000ML POUR (IV SOLUTION) ×3 IMPLANT
WIRE G V18X300CM (WIRE) ×2 IMPLANT

## 2018-03-27 NOTE — Evaluation (Signed)
Occupational Therapy Evaluation Patient Details Name: Emma [name] MRN: [id number] DOB: 09/13/1945 Today's Date: 03/27/2018    History of Present Illness Pt admitted 03/23/18 with critical R LE ischemia. Underwent R femoral BPG 3/9  and distal femoral BPG 3/10. Pt with new onset of pulselessness 3/11 and taken back to OR. PMH: current smoker, back sx, PVD, DM, HTN.   Clinical Impression   Pt is typically independent. Presents with R LE pain and inability to stand or ambulate as a result this visit. Pt requires set up to min assist for ADL. Likely to progress well for a home discharge. Will follow acutely.    Follow Up Recommendations  No OT follow up    Equipment Recommendations  None recommended by OT    Recommendations for Other Services       Precautions / Restrictions Precautions Precautions: Fall Restrictions Weight Bearing Restrictions: No      Mobility Bed Mobility Overal bed mobility: Needs Assistance Bed Mobility: Supine to Sit;Sit to Supine     Supine to sit: Supervision;HOB elevated Sit to supine: Supervision;HOB elevated   General bed mobility comments: +2 assist to pull up in bed, pt able to transition from supine to long sit in bed   Transfers                 General transfer comment: deferred due to R LE pain    Balance Overall balance assessment: Needs assistance   Sitting balance-Leahy Scale: Normal                                     ADL either performed or assessed with clinical judgement   ADL Overall ADL's : Needs assistance/impaired Eating/Feeding: Independent;Sitting   Grooming: Wash/dry face;Wash/dry hands;Sitting;Set up   Upper Body Bathing: Set up;Sitting   Lower Body Bathing: Minimal assistance;Sitting/lateral leans   Upper Body Dressing : Set up;Sitting   Lower Body Dressing: Minimal assistance;Sitting/lateral leans               Functional mobility during ADLs: (pt unable to tolerate  standing)       Vision Baseline Vision/History: Wears glasses Wears Glasses: Reading only Patient Visual Report: No change from baseline       Perception     Praxis      Pertinent Vitals/Pain Pain Assessment: Faces Faces Pain Scale: Hurts whole lot Pain Location: R LE Pain Descriptors / Indicators: Grimacing;Guarding Pain Intervention(s): Monitored during session;Repositioned     Hand Dominance Left   Extremity/Trunk Assessment Upper Extremity Assessment Upper Extremity Assessment: Overall WFL for tasks assessed   Lower Extremity Assessment Lower Extremity Assessment: Defer to PT evaluation   Cervical / Trunk Assessment Cervical / Trunk Assessment: Normal   Communication Communication Communication: No difficulties   Cognition Arousal/Alertness: Awake/alert Behavior During Therapy: WFL for tasks assessed/performed Overall Cognitive Status: Within Functional Limits for tasks assessed                                     General Comments       Exercises     Shoulder Instructions      Home Living Family/patient expects to be discharged to:: Private residence Living Arrangements: Alone Available Help at Discharge: Family;Friend(s);Available PRN/intermittently Type of Home: Apartment Home Access: Level entry     Home Layout: One level  Bathroom Shower/Tub: Chief Strategy Officer: Handicapped height     Home Equipment: Environmental consultant - 2 wheels;Cane - single point;Bedside commode          Prior Functioning/Environment Level of Independence: Independent        Comments: works in Clinical biochemist, drives        OT Problem List: Impaired balance (sitting and/or standing);Pain;Decreased knowledge of use of DME or AE      OT Treatment/Interventions: Self-care/ADL training;DME and/or AE instruction;Patient/family education;Balance training;Therapeutic activities    OT Goals(Current goals can be found in the care plan  section) Acute Rehab OT Goals Patient Stated Goal: to return to work OT Goal Formulation: With patient Time For Goal Achievement: 04/10/18 Potential to Achieve Goals: Good ADL Goals Pt Will Perform Grooming: with modified independence;standing Pt Will Perform Lower Body Bathing: with modified independence;sit to/from stand Pt Will Perform Lower Body Dressing: with modified independence;sit to/from stand Pt Will Transfer to Toilet: with modified independence;ambulating Pt Will Perform Toileting - Clothing Manipulation and hygiene: with modified independence;sit to/from stand Pt Will Perform Tub/Shower Transfer: with modified independence;ambulating;3 in 1;rolling walker  OT Frequency: Min 2X/week   Barriers to D/C:            Co-evaluation PT/OT/SLP Co-Evaluation/Treatment: Yes Reason for Co-Treatment: For patient/therapist safety   OT goals addressed during session: ADL's and self-care      AM-PAC OT "6 Clicks" Daily Activity     Outcome Measure Help from another person eating meals?: None Help from another person taking care of personal grooming?: A Little Help from another person toileting, which includes using toliet, bedpan, or urinal?: A Little Help from another person bathing (including washing, rinsing, drying)?: A Little Help from another person to put on and taking off regular upper body clothing?: None Help from another person to put on and taking off regular lower body clothing?: A Little 6 Click Score: 20   End of Session    Activity Tolerance: Patient limited by pain Patient left: in bed;with call bell/phone within reach  OT Visit Diagnosis: Unsteadiness on feet (R26.81);Other abnormalities of gait and mobility (R26.89);Pain;Muscle weakness (generalized) (M62.81) Pain - Right/Left: Right Pain - part of body: Leg                Time: 1610-9604 OT Time Calculation (min): 18 min Charges:  OT General Charges $OT Visit: 1 Visit OT Evaluation $OT Eval Moderate  Complexity: 1 Mod  Martie Round, OTR/L Acute Rehabilitation Services Pager: [phone] Office: [phone]  Evern Bio 03/27/2018, 10:12 AM

## 2018-03-27 NOTE — Anesthesia Procedure Notes (Addendum)
Procedure Name: Intubation Date/Time: 03/27/2018 10:56 AM Performed by: Mayer Camel, CRNA Pre-anesthesia Checklist: Patient identified, Emergency Drugs available, Suction available and Patient being monitored Patient Re-evaluated:Patient Re-evaluated prior to induction Oxygen Delivery Method: Circle System Utilized Preoxygenation: Pre-oxygenation with 100% oxygen Induction Type: IV induction Ventilation: Mask ventilation without difficulty Laryngoscope Size: Miller and 2 Grade View: Grade I Tube type: Oral Tube size: 7.0 mm Number of attempts: 1 Airway Equipment and Method: Stylet and Oral airway Placement Confirmation: ETT inserted through vocal cords under direct vision,  positive ETCO2 and breath sounds checked- equal and bilateral Secured at: 21 cm Tube secured with: Tape Dental Injury: Teeth and Oropharynx as per pre-operative assessment

## 2018-03-27 NOTE — Progress Notes (Signed)
Pt transported to OR. Pre-procedure checklist completed. Report given to receiving nurse.

## 2018-03-27 NOTE — Op Note (Signed)
Patient name: Emma [name] MRN: [id number] DOB: 1945-08-02 Sex: female  03/27/2018 Pre-operative Diagnosis: Occluded right femoral to posterior tibial artery bypass Post-operative diagnosis:  Same Surgeon:  Erlene Quan C. Donzetta Matters, MD Assistants: Laurence Slate, PA; Arlee Muslim, [place] Procedure Performed: 1.  Bypass thromboembolectomy right femoral to PT greater saphenous vein 2.  Endarterectomy of right peroneal artery 3.  Right common femoral to peroneal artery bypass in the end with non-reversed ipsilateral greater saphenous vein tunneled anatomically. 4.  Balloon angioplasty of right peroneal artery with 2.5 mm balloon distally and 3 mm balloon proximally  Indications: 73 year old female underwent right femoral to posterior tibial artery bypass the day prior.  This was noted to be occluded.  She is indicated for revision with possible bypass to the peroneal artery.  Findings: Bypass was occluded.  There was no runoff in the posterior tibial artery.  I was able to establish good inflow with thrombectomy of the graft.  I then harvested the vein graft through all the incisions tunneled anatomically.  Popliteal artery was completely occluded as was the tibioperoneal trunk.  I was able to get minimal backbleeding from the peroneal artery and performed endarterectomy and sewed the bypass and then to the peroneal artery.  At completion I had a signal but there was high resistance outflow.  I then passed the wire performed angiography performed balloon angioplasty distally with 2.5 approximately 3 mm balloon.  At completion there was good runoff.  I did have a good signal in the bypass graft but no signal at the ankle.  Patient will remain high risk for bypass graft occlusion which would require above-knee amputation.   Procedure:  The patient was identified in the holding area and taken to the operating room where she is supine operative when general anesthesia induced.  She was sterilely prepped.  The  right lower extremity usual fashion given antibiotics and a timeout was called.  I began by opening the ankle incision identified the thrombosed graft.  I then opened the below-knee popliteal incision.  Patient was fully heparinized.  I passed the 3 Fogarty catheter proximally establish good inflow in the bypass and clamped it.  I then dissected out the popliteal artery itself which was noted to be calcified.  I expose the entire peroneal artery.  I then decided I would need further veins.  I opened all of the other incisions.  I harvested the vein graft by dividing the existing branches.  I then marked this for orientation and tunneled it.  I did have good bleeding through the vein graft itself was pulsatile after tunneling.  I then opened the popliteal artery which was completely occluded.  I dissected down onto the tibioperoneal trunk distally was occluded.  I reached a point on the peroneal artery where there was some good backbleeding.  I transected this performed endarterectomy.  I then spatulated my vein which was the most vein I had left as some of it had torn during embolectomy.  I then sewed it end-to-end with 6-0 Prolene suture.  Prior to completion anastomosis I had allowed flushing in all directions.  At completion there was a signal in the bypass graft although it was high resistance outflow.  With this I cannulated the vein graft with a micropuncture needle followed the wire and sheath.  We performed angiography with there is no flow in the bypass.  I occluded the other outflow tract there is still no outflow in the bypass.  I then passed the wire distally  down the peroneal artery under fluoroscopic guidance.  I performed balloon angioplasty initially with a 3 mm balloon and then I got better flow distally but nothing to the ankle.  I then passed a 2.5 mm balloon perform angiogram.  100 mcg of nitro were given.  I still have no outflow my graft.  I then inflated 3 balloon across the anastomosis at 4  atm for 3 minutes.  Completion of demonstrated flow all the way to the foot.  There is a good signal in the bypass.  I could not find a signal at the ankle.  Unfortunately attempted I have done everything I can and patient will be at high risk for amputation.  I did not reverse the heparinization.  I obtained hemostasis irrigated all my wounds and closed in layers with Vicryl and staples up to the level of the groin where I closed with Vicryl and Monocryl.  Dermabond was placed in the groin incision.  She was then awakened anesthesia having tolerated procedure without immediate complication.  All counts were correct at completion.  EBL: 500 cc  Blood administered: 2 units packed red blood cells.    Emma [name] C. Donzetta Matters, MD [facility] Office: [phone] Pager: [phone]

## 2018-03-27 NOTE — Progress Notes (Signed)
Patient c/o extreme pain. Patient will not let this RN touch her right leg or assess/doppler her right leg. Patient was then given 2 mg morphine with only a slight decrease in pain level. Patient is still moaning and c/o extreme pain in right leg. Will continue to monitor

## 2018-03-27 NOTE — Progress Notes (Signed)
Pt arrived back from PACU. No PT or pedal pulse palpable or dopplerable in right foot. MD aware. Pt instructed to keep right leg covered and warm. Pt oriented. Telemetry box applied. Vitals obtained. Will continue to monitor.

## 2018-03-27 NOTE — Progress Notes (Signed)
Inpatient Diabetes Program Recommendations  AACE/ADA: New Consensus Statement on Inpatient Glycemic Control (2015)  Target Ranges:  Prepandial:   less than 140 mg/dL      Peak postprandial:   less than 180 mg/dL (1-2 hours)      Critically ill patients:  140 - 180 mg/dL   Lab Results  Component Value Date   GLUCAP 234 (H) 03/27/2018    Review of Glycemic Control Results for Emma [name], Emma [name] (MRN [id number]) as of 03/27/2018 14:44  Ref. Range 03/26/2018 06:01 03/26/2018 09:22 03/26/2018 17:30 03/26/2018 19:29 03/27/2018 09:09  Glucose-Capillary Latest Ref Range: 70 - 99 mg/dL 765 (H) 465 (H) 035 (H) 298 (H) 234 (H)   Diabetes history: DM 2 Outpatient Diabetes medications:  Metformin 1000 mg bid Current orders for Inpatient glycemic control:  Novolog moderate tid with meals Inpatient Diabetes Program Recommendations:   Please consider adding Lantus 10 units daily.   Thanks  Beryl Meager, RN, BC-ADM Inpatient Diabetes Coordinator Pager [phone] (8a-5p)

## 2018-03-27 NOTE — Transfer of Care (Signed)
Immediate Anesthesia Transfer of Care Note  Patient: [facility] Inpatient  Procedure(s) Performed: Right lower extremity angiogram, Right Femoral Peroneal artery bypass, Angioplasty Right Femoral Peroneal bypass, Thrombectomy of Femoral peroneal artery (Right Leg Lower)  Patient Location: PACU  Anesthesia Type:General  Level of Consciousness: awake, alert  and oriented  Airway & Oxygen Therapy: Patient Spontanous Breathing and Patient connected to nasal cannula oxygen  Post-op Assessment: Report given to RN, Post -op Vital signs reviewed and stable and Patient moving all extremities X 4  Post vital signs: Reviewed and stable  Last Vitals:  Vitals Value Taken Time  BP 166/84 03/27/2018  2:50 PM  Temp    Pulse    Resp 21 03/27/2018  2:51 PM  SpO2    Vitals shown include unvalidated device data.  Last Pain:  Vitals:   03/27/18 0705  TempSrc:   PainSc: Asleep      Patients Stated Pain Goal: 0 (03/27/18 0105)  Complications: No apparent anesthesia complications

## 2018-03-27 NOTE — Progress Notes (Signed)
Heparin drip stopped at 1000 per Dr. Randie Heinz. He said he will restart it in the OR.

## 2018-03-27 NOTE — Progress Notes (Addendum)
[facility]  Subjective  - Pain in the right foot to light touch.   Objective (!) 144/69 90 98.6 F (37 C) (Oral) 15 100%  Intake/Output Summary (Last 24 hours) at 03/27/2018 0720 Last data filed at 03/27/2018 0600 Gross per 24 hour  Intake 3627.28 ml  Output 3050 ml  Net 577.28 ml    Active range of motion minimal in right foot and toes, painful to touch. Non palpable PT pulse and no doppler signal this am Incisions healing well.    Assessment/Planning: POD # 1 Fem to PT at the ankle with in situ GSV  Heparin 500 units/hr over night Pain in the right foot pulseless  She is NPO until DR. Randie Heinz sees her at bedside.  She may need to return to the OR today.    Mosetta Pigeon 03/27/2018 7:20 AM --  Laboratory Lab Results: Recent Labs    03/25/18 0253 03/26/18 0305 03/26/18 1820  WBC 7.8 8.1  --   HGB 10.0* 9.8* 8.8*  HCT 32.0* 30.6* 26.0*  PLT 273 293  --    BMET Recent Labs    03/25/18 0253 03/26/18 0305 03/26/18 1820  NA 138 137 139  K 4.2 4.4 4.6  CL 102 101  --   CO2 26 26  --   GLUCOSE 251* 278*  --   BUN 14 15  --   CREATININE 1.06* 1.07*  --   CALCIUM 8.6* 8.7*  --     COAG Lab Results  Component Value Date   INR 1.0 03/25/2018   No results found for: PTT    I have interviewed and examined patient with PA and agree with assessment and plan above. Unfortunately bypass is occluded and will need to return to OR this a.m..  Alyze Lauf C. Randie Heinz, MD [facility] [place] Office: [phone] Pager: [phone]

## 2018-03-27 NOTE — Evaluation (Signed)
Physical Therapy Evaluation Patient Details Name: Emma [name] MRN: [id number] DOB: Feb 27, 1945 Today's Date: 03/27/2018   History of Present Illness  Pt is a 73 y.o. female admitted 03/23/18 with critical R LE ischemia. Underwent R femoral BPG 3/9 and distal femoral BPG 3/10. Pt with new onset of R foot pulselessness 3/11 and taken back to OR. PMH includes current smoker, back sx (2016), PVD, DM, HTN.    Clinical Impression  Pt presents with an overall decrease in functional mobility secondary to above. PTA, pt indep, works and lives alone. Today, pt's mobility limited by significant RLE pain, unable to stand; plan is for additional vascular surgery today. Expect pt to progress well with mobility once pain improved, hopeful for discharge home. Will follow acutely to address established goals.    Follow Up Recommendations Home health PT;Supervision - Intermittent(pending progression post-op)    Equipment Recommendations  None recommended by PT    Recommendations for Other Services       Precautions / Restrictions Precautions Precautions: Fall Restrictions Weight Bearing Restrictions: No      Mobility  Bed Mobility Overal bed mobility: Needs Assistance Bed Mobility: Supine to Sit;Sit to Supine     Supine to sit: Supervision;HOB elevated Sit to supine: Supervision;HOB elevated   General bed mobility comments: +2 assist to pull up in bed as pt not able to push through LEs well; pt able to transition from supine to long sit in bed without assist  Transfers                 General transfer comment: deferred due to RLE pain  Ambulation/Gait                Stairs            Wheelchair Mobility    Modified Rankin (Stroke Patients Only)       Balance Overall balance assessment: Needs assistance   Sitting balance-Leahy Scale: Normal Sitting balance - Comments: Able to reach bilateral feet sitting EOB                                      Pertinent Vitals/Pain Pain Assessment: Faces Faces Pain Scale: Hurts whole lot Pain Location: R LE Pain Descriptors / Indicators: Grimacing;Guarding Pain Intervention(s): Monitored during session;Limited activity within patient's tolerance    Home Living Family/patient expects to be discharged to:: Private residence Living Arrangements: Alone Available Help at Discharge: Family;Friend(s);Available PRN/intermittently Type of Home: Apartment Home Access: Level entry     Home Layout: One level Home Equipment: Walker - 2 wheels;Cane - single point;Bedside commode Additional Comments: Reports family will be able to assist with driving/errands    Prior Function Level of Independence: Independent         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), drives     Hand Dominance   Dominant Hand: Left    Extremity/Trunk Assessment   Upper Extremity Assessment Upper Extremity Assessment: Overall WFL for tasks assessed    Lower Extremity Assessment Lower Extremity Assessment: RLE deficits/detail RLE Deficits / Details: Functionally at least 3/5 throughout although limited by significant pain RLE: Unable to fully assess due to pain    Cervical / Trunk Assessment Cervical / Trunk Assessment: Normal  Communication   Communication: No difficulties  Cognition Arousal/Alertness: Awake/alert Behavior During Therapy: WFL for tasks assessed/performed Overall Cognitive Status: Within Functional Limits for tasks assessed  General Comments      Exercises     Assessment/Plan    PT Assessment Patient needs continued PT services  PT Problem List Decreased strength;Decreased range of motion;Decreased activity tolerance;Decreased balance;Decreased mobility;Decreased knowledge of use of DME;Decreased knowledge of precautions       PT Treatment Interventions DME instruction;Gait training;Stair  training;Functional mobility training;Therapeutic activities;Therapeutic exercise;Balance training;Patient/family education    PT Goals (Current goals can be found in the Care Plan section)  Acute Rehab PT Goals Patient Stated Goal: to return to work PT Goal Formulation: With patient Time For Goal Achievement: 04/10/18 Potential to Achieve Goals: Good    Frequency Min 3X/week   Barriers to discharge Decreased caregiver support      Co-evaluation PT/OT/SLP Co-Evaluation/Treatment: Yes Reason for Co-Treatment: For patient/therapist safety(Pt limited by significant pain) PT goals addressed during session: Mobility/safety with mobility         AM-PAC PT "6 Clicks" Mobility  Outcome Measure Help needed turning from your back to your side while in a flat bed without using bedrails?: None Help needed moving from lying on your back to sitting on the side of a flat bed without using bedrails?: None Help needed moving to and from a bed to a chair (including a wheelchair)?: A Little Help needed standing up from a chair using your arms (e.g., wheelchair or bedside chair)?: A Little Help needed to walk in hospital room?: A Little Help needed climbing 3-5 steps with a railing? : A Lot 6 Click Score: 19    End of Session   Activity Tolerance: Patient limited by pain Patient left: in bed;with call bell/phone within reach Nurse Communication: Mobility status PT Visit Diagnosis: Other abnormalities of gait and mobility (R26.89);Pain Pain - Right/Left: Right Pain - part of body: Leg;Ankle and joints of foot    Time: 4098-1191 PT Time Calculation (min) (ACUTE ONLY): 18 min   Charges:   PT Evaluation $PT Eval Moderate Complexity: 1 Mod        Ina Homes, PT, DPT Acute Rehabilitation Services  Pager [phone] Office [phone]  Malachy Chamber 03/27/2018, 1:08 PM

## 2018-03-27 NOTE — Progress Notes (Signed)
CRITICAL VALUE ALERT  Critical Value:  PTT >200  Date & Time Notied:  03/27/18 1640  Provider Notified: Aggie Moats, PA  Orders Received/Actions taken: no new orders received

## 2018-03-27 NOTE — Plan of Care (Signed)
  Problem: Clinical Measurements: Goal: Will remain free from infection Outcome: Progressing   Problem: Health Behavior/Discharge Planning: Goal: Ability to manage health-related needs will improve Outcome: Not Progressing   Problem: Activity: Goal: Risk for activity intolerance will decrease Outcome: Not Progressing   Problem: Elimination: Goal: Will not experience complications related to bowel motility Outcome: Not Progressing

## 2018-03-27 NOTE — Anesthesia Preprocedure Evaluation (Addendum)
Anesthesia Evaluation  Patient identified by MRN, date of birth, ID band Patient awake    Reviewed: Allergy & Precautions, H&P , NPO status , Patient's Chart, lab work & pertinent test results  History of Anesthesia Complications Negative for: history of anesthetic complications  Airway Mallampati: III  TM Distance: >3 FB Neck ROM: Full    Dental  (+) Edentulous Upper, Edentulous Lower   Pulmonary Current Smoker,    breath sounds clear to auscultation       Cardiovascular hypertension, Pt. on medications (-) angina+ Peripheral Vascular Disease   Rhythm:Regular Rate:Normal     Neuro/Psych negative neurological ROS     GI/Hepatic negative GI ROS, Neg liver ROS,   Endo/Other  diabetes, Oral Hypoglycemic AgentsHypothyroidism   Renal/GU negative Renal ROS     Musculoskeletal   Abdominal   Peds  Hematology  (+) Blood dyscrasia (Hb 9.8), ,   Anesthesia Other Findings   Reproductive/Obstetrics                            Anesthesia Physical  Anesthesia Plan  ASA: III  Anesthesia Plan: General   Post-op Pain Management:    Induction: Intravenous  PONV Risk Score and Plan: 2 and Ondansetron and Dexamethasone  Airway Management Planned: Oral ETT  Additional Equipment: None  Intra-op Plan:   Post-operative Plan: Extubation in OR  Informed Consent: I have reviewed the patients History and Physical, chart, labs and discussed the procedure including the risks, benefits and alternatives for the proposed anesthesia with the patient or authorized representative who has indicated his/her understanding and acceptance.     Dental advisory given  Plan Discussed with: CRNA  Anesthesia Plan Comments:         Anesthesia Quick Evaluation

## 2018-03-28 ENCOUNTER — Encounter (HOSPITAL_COMMUNITY): Payer: Self-pay | Admitting: Vascular Surgery

## 2018-03-28 LAB — GLUCOSE, CAPILLARY
Glucose-Capillary: 230 mg/dL — ABNORMAL HIGH (ref 70–99)
Glucose-Capillary: 185 mg/dL — ABNORMAL HIGH (ref 70–99)
Glucose-Capillary: 224 mg/dL — ABNORMAL HIGH (ref 70–99)
Glucose-Capillary: 158 mg/dL — ABNORMAL HIGH (ref 70–99)

## 2018-03-28 LAB — CBC
HCT: 27.6 % — ABNORMAL LOW (ref 36.0–46.0)
Hemoglobin: 8.9 g/dL — ABNORMAL LOW (ref 12.0–15.0)
MCH: 29.5 pg (ref 26.0–34.0)
MCHC: 32.2 g/dL (ref 30.0–36.0)
MCV: 91.4 fL (ref 80.0–100.0)
NRBC: 0 % (ref 0.0–0.2)
PLATELETS: 234 10*3/uL (ref 150–400)
RBC: 3.02 MIL/uL — ABNORMAL LOW (ref 3.87–5.11)
RDW: 15.9 % — ABNORMAL HIGH (ref 11.5–15.5)
WBC: 9.4 10*3/uL (ref 4.0–10.5)

## 2018-03-28 LAB — APTT: aPTT: 27 seconds (ref 24–36)

## 2018-03-28 NOTE — Progress Notes (Signed)
Approximately 0620 this AM, patient's heart rate elevated to 168. This RN rushed into the patient's room to make sure she was okay. Patient was lying in bed asymptomatic. Informed patient that her heart rate went up to 168. Patient stated that, " that's because I was talking to my sister". Patient's heart rate returned to baseline.

## 2018-03-28 NOTE — Progress Notes (Signed)
Physical Therapy Treatment Patient Details Name: Emma [name] MRN: [id number] DOB: 03/10/1945 Today's Date: 03/28/2018    History of Present Illness Pt is a 73 y.o. female admitted 03/23/18 with critical R LE ischemia. Underwent R femoral BPG 3/9 and distal femoral BPG 3/10. Pt with new onset of R foot pulselessness 3/11 and taken back to OR. PMH includes current smoker, back sx (2016), PVD, DM, HTN.    PT Comments    Pt was unable to do much with her RLE today due to pain and the ongoing coldness of poor circulation.  Worked on ROM to leg as tolerated mainly to increase use of R hip, and then worked on LLE strength and ROM to increase functional use when ready.  Pt is in pain, meds are barely adequate during tx and then after called nurse at pt request to manage for comfort.  Follow acutely until transition to surgery then reassess as needed for ability to transfer and walk.    Follow Up Recommendations  Home health PT;Supervision/Assistance - 24 hour     Equipment Recommendations  None recommended by PT    Recommendations for Other Services       Precautions / Restrictions Precautions Precautions: Fall Precaution Comments: pulseless R LE Restrictions Weight Bearing Restrictions: No    Mobility  Bed Mobility Overal bed mobility: Needs Assistance Bed Mobility: Supine to Sit;Sit to Supine     Supine to sit: Max assist Sit to supine: Mod assist   General bed mobility comments: pt declined OOB  Transfers                 General transfer comment: not able to tolerate  Ambulation/Gait                 Stairs             Wheelchair Mobility    Modified Rankin (Stroke Patients Only)       Balance Overall balance assessment: Needs assistance Sitting-balance support: Bilateral upper extremity supported Sitting balance-Leahy Scale: Poor Sitting balance - Comments: posterior lean                                    Cognition  Arousal/Alertness: Lethargic Behavior During Therapy: Flat affect Overall Cognitive Status: Impaired/Different from baseline Area of Impairment: Following commands;Attention;Problem solving                   Current Attention Level: Selective   Following Commands: Follows one step commands with increased time;Follows one step commands inconsistently     Problem Solving: Slow processing;Decreased initiation;Difficulty sequencing General Comments: lethargic likely pain meds      Exercises General Exercises - Lower Extremity Ankle Circles/Pumps: AROM;Left;5 reps Quad Sets: AROM;Both;10 reps Gluteal Sets: AROM;Both;10 reps Heel Slides: AROM;Left;10 reps Hip ABduction/ADduction: AROM;AAROM;Both;10 reps Straight Leg Raises: PROM;Both;5 reps    General Comments        Pertinent Vitals/Pain Pain Assessment: Faces Faces Pain Scale: Hurts whole lot Pain Location: R LE Pain Descriptors / Indicators: Grimacing Pain Intervention(s): Limited activity within patient's tolerance;Monitored during session;Premedicated before session;Repositioned    Home Living                      Prior Function            PT Goals (current goals can now be found in the care plan section) Acute Rehab PT Goals Patient  Stated Goal: to return to work Progress towards PT goals: Not progressing toward goals - comment    Frequency    Min 3X/week      PT Plan Current plan remains appropriate    Co-evaluation              AM-PAC PT "6 Clicks" Mobility   Outcome Measure  Help needed turning from your back to your side while in a flat bed without using bedrails?: A Little Help needed moving from lying on your back to sitting on the side of a flat bed without using bedrails?: A Little Help needed moving to and from a bed to a chair (including a wheelchair)?: A Little Help needed standing up from a chair using your arms (e.g., wheelchair or bedside chair)?: Total Help needed  to walk in hospital room?: Total Help needed climbing 3-5 steps with a railing? : Total 6 Click Score: 12    End of Session Equipment Utilized During Treatment: Oxygen Activity Tolerance: Patient tolerated treatment well;Patient limited by fatigue;Patient limited by lethargy;Patient limited by pain Patient left: in bed;with call bell/phone within reach;with bed alarm set Nurse Communication: Mobility status PT Visit Diagnosis: Other abnormalities of gait and mobility (R26.89);Pain Pain - Right/Left: Right Pain - part of body: Leg;Ankle and joints of foot     Time: 3846-6599 PT Time Calculation (min) (ACUTE ONLY): 19 min  Charges:  $Therapeutic Exercise: 8-22 mins                    Ivar Drape 03/28/2018, 5:37 PM  Samul Dada, PT MS Acute Rehab Dept. Number: [facility] [id number] and [facility] [phone]

## 2018-03-28 NOTE — Progress Notes (Signed)
Pain continued to be an issue for this patient. See MAR for given pain meds. Right leg occasionally jerks which increases her pain level

## 2018-03-28 NOTE — Progress Notes (Signed)
Occupational Therapy Treatment Patient Details Name: Emma [name] MRN: [id number] DOB: 08-28-1945 Today's Date: 03/28/2018    History of present illness Pt is a 73 y.o. female admitted 03/23/18 with critical R LE ischemia. Underwent R femoral BPG 3/9 and distal femoral BPG 3/10. Pt with new onset of R foot pulselessness 3/11 and taken back to OR. PMH includes current smoker, back sx (2016), PVD, DM, HTN.   OT comments  Pt significantly weaker with difficulty following commands and sequencing bed level mobility. Plan was for OOB to chair, but pt unable to participate adequately to assist. Pt reports the plan is for R AKA either tomorrow or Monday. Will need to reassess and determine optimal d/c plan following surgery.   Follow Up Recommendations  Other (comment)(TBD following AKA)    Equipment Recommendations  Other (comment)(TBD following AKA)    Recommendations for Other Services      Precautions / Restrictions Precautions Precautions: Fall Precaution Comments: pulseless R LE       Mobility Bed Mobility Overal bed mobility: Needs Assistance Bed Mobility: Supine to Sit;Sit to Supine     Supine to sit: Max assist Sit to supine: Mod assist   General bed mobility comments: pt significantly weaker than upon evaluation, requiring assistance to raise trunk and for LEs in/OOB  Transfers                 General transfer comment: deferred due to lethargy    Balance Overall balance assessment: Needs assistance Sitting-balance support: Bilateral upper extremity supported Sitting balance-Leahy Scale: Poor Sitting balance - Comments: posterior lean                                   ADL either performed or assessed with clinical judgement   ADL                                               Vision       Perception     Praxis      Cognition Arousal/Alertness: Awake/alert Behavior During Therapy: Flat affect Overall Cognitive  Status: Impaired/Different from baseline Area of Impairment: Following commands;Attention;Problem solving                   Current Attention Level: Sustained   Following Commands: Follows one step commands inconsistently;Follows one step commands with increased time     Problem Solving: Slow processing;Decreased initiation;Difficulty sequencing;Requires verbal cues;Requires tactile cues General Comments: cognition issues likely due to pain meds        Exercises     Shoulder Instructions       General Comments      Pertinent Vitals/ Pain       Pain Assessment: Faces Faces Pain Scale: Hurts whole lot Pain Location: R LE Pain Descriptors / Indicators: Grimacing;Guarding;Moaning Pain Intervention(s): Monitored during session;Premedicated before session;Patient requesting pain meds-RN notified;Limited activity within patient's tolerance  Home Living                                          Prior Functioning/Environment              Frequency  Min 2X/week  Progress Toward Goals  OT Goals(current goals can now be found in the care plan section)  Progress towards OT goals: Not progressing toward goals - comment(pt with pain and lethargy likely from pain meds)  Acute Rehab OT Goals Patient Stated Goal: to return to work OT Goal Formulation: With patient Time For Goal Achievement: 04/10/18 Potential to Achieve Goals: Good  Plan Other (comment)(TBD following AKA)    Co-evaluation                 AM-PAC OT "6 Clicks" Daily Activity     Outcome Measure   Help from another person eating meals?: None Help from another person taking care of personal grooming?: A Little Help from another person toileting, which includes using toliet, bedpan, or urinal?: A Lot Help from another person bathing (including washing, rinsing, drying)?: A Lot Help from another person to put on and taking off regular upper body clothing?: A Lot Help  from another person to put on and taking off regular lower body clothing?: Total 6 Click Score: 14    End of Session    OT Visit Diagnosis: Pain;Muscle weakness (generalized) (M62.81);Other symptoms and signs involving cognitive function Pain - Right/Left: Right Pain - part of body: Leg   Activity Tolerance Patient limited by lethargy   Patient Left in bed;with call bell/phone within reach;with bed alarm set   Nurse Communication Mobility status        Time: 1326-1350 OT Time Calculation (min): 24 min  Charges: OT General Charges $OT Visit: 1 Visit OT Treatments $Therapeutic Activity: 23-37 mins  Martie Round, OTR/L Acute Rehabilitation Services Pager: [phone] Office: [phone]   Evern Bio 03/28/2018, 2:58 PM

## 2018-03-28 NOTE — Progress Notes (Addendum)
[facility]  Subjective  - Pain in the right foot.   Objective 138/64 (!) 101 98.6 F (37 C) (Oral) 19 98%  Intake/Output Summary (Last 24 hours) at 03/28/2018 0718 Last data filed at 03/27/2018 2241 Gross per 24 hour  Intake 3415.34 ml  Output 1450 ml  Net 1965.34 ml    Painful to touch, cold, minimal motor intact Dressings dry, groin soft Heart RRR Lungs non labored breathing  Assessment/Planning: POD # re-do  Procedure Performed: 1.  Bypass thromboembolectomy right femoral to PT greater saphenous vein 2.  Endarterectomy of right peroneal artery 3.  Right common femoral to peroneal artery bypass in the end with non-reversed ipsilateral greater saphenous vein tunneled anatomically. 4.  Balloon angioplasty of right peroneal artery with 2.5 mm balloon distally and 3 mm balloon proximally  Right foot is cold, patient refused to allow me to use doppler this am.  She is at very high risk of amputation.     Emma [name] 03/28/2018 7:18 AM --  Laboratory Lab Results: Recent Labs    03/27/18 0646 03/28/18 0443  WBC 8.7 9.4  HGB 7.8* 8.9*  HCT 24.2* 27.6*  PLT 311 234   BMET Recent Labs    03/26/18 0305 03/26/18 1820 03/27/18 0646  NA 137 139 140  K 4.4 4.6 4.4  CL 101  --  105  CO2 26  --  24  GLUCOSE 278*  --  252*  BUN 15  --  11  CREATININE 1.07*  --  0.99  CALCIUM 8.7*  --  8.4*    COAG Lab Results  Component Value Date   INR 1.0 03/25/2018   No results found for: PTT  I have independently interviewed and examined patient and agree with PA assessment and plan above.  Severe ischemia right foot with mottling and pain.  There are no signals at the right ankle or foot.  She will need above-knee amputation I have offered this for her tomorrow but currently she is unwilling to proceed.  We will continue heparin drip plan for amputation in the near future.  Brandon C. Randie Heinz, MD [facility]  [place] Office: [phone] Pager: [phone]

## 2018-03-28 NOTE — Care Management Important Message (Signed)
Important Message  Patient Details  Name: Emma [name] MRN: [id number] Date of Birth: Oct 03, 1945   Medicare Important Message Given:  Yes    Oralia Rud Reese Stockman 03/28/2018, 3:03 PM

## 2018-03-29 ENCOUNTER — Encounter (HOSPITAL_COMMUNITY): Payer: Self-pay | Admitting: Certified Registered Nurse Anesthetist

## 2018-03-29 ENCOUNTER — Inpatient Hospital Stay (HOSPITAL_COMMUNITY): Payer: Medicare HMO | Admitting: Anesthesiology

## 2018-03-29 ENCOUNTER — Encounter (HOSPITAL_COMMUNITY)
Admission: EM | Disposition: A | Discharge status: Skilled Nursing Facility | Payer: Self-pay | Source: Home / Self Care | Attending: Vascular Surgery

## 2018-03-29 HISTORY — PX: AMPUTATION: SHX166

## 2018-03-29 LAB — GLUCOSE, CAPILLARY
GLUCOSE-CAPILLARY: 143 mg/dL — AB (ref 70–99)
Glucose-Capillary: 145 mg/dL — ABNORMAL HIGH (ref 70–99)
Glucose-Capillary: 226 mg/dL — ABNORMAL HIGH (ref 70–99)
Glucose-Capillary: 167 mg/dL — ABNORMAL HIGH (ref 70–99)

## 2018-03-29 LAB — CBC
HCT: 20 % — ABNORMAL LOW (ref 36.0–46.0)
Hemoglobin: 6.2 g/dL — CL (ref 12.0–15.0)
MCH: 29.7 pg (ref 26.0–34.0)
MCHC: 31 g/dL (ref 30.0–36.0)
MCV: 95.7 fL (ref 80.0–100.0)
Platelets: 245 10*3/uL (ref 150–400)
RBC: 2.09 MIL/uL — ABNORMAL LOW (ref 3.87–5.11)
RDW: 15.3 % (ref 11.5–15.5)
WBC: 9.5 10*3/uL (ref 4.0–10.5)
nRBC: 0 % (ref 0.0–0.2)

## 2018-03-29 LAB — BASIC METABOLIC PANEL
ANION GAP: 10 (ref 5–15)
BUN: 6 mg/dL — ABNORMAL LOW (ref 8–23)
CO2: 23 mmol/L (ref 22–32)
Calcium: 7.4 mg/dL — ABNORMAL LOW (ref 8.9–10.3)
Chloride: 104 mmol/L (ref 98–111)
Creatinine, Ser: 1 mg/dL (ref 0.44–1.00)
GFR calc Af Amer: >60 mL/min (ref >60)
GFR calc non Af Amer: 57 mL/min — ABNORMAL LOW (ref >60)
Glucose, Bld: 148 mg/dL — ABNORMAL HIGH (ref 70–99)
Potassium: 3 mmol/L — ABNORMAL LOW (ref 3.5–5.1)
Sodium: 137 mmol/L (ref 135–145)

## 2018-03-29 LAB — PREPARE RBC (CROSSMATCH)

## 2018-03-29 LAB — APTT: aPTT: 33 seconds (ref 24–36)

## 2018-03-29 SURGERY — AMPUTATION, ABOVE KNEE
Anesthesia: General | Site: Leg Upper | Laterality: Right

## 2018-03-29 MED ORDER — POTASSIUM CHLORIDE CRYS ER 20 MEQ PO TBCR: 20.0000 meq | EXTENDED_RELEASE_TABLET | Freq: Every day | ORAL | Status: DC | PRN

## 2018-03-29 MED ORDER — ONDANSETRON HCL 4 MG/2ML IJ SOLN
INTRAMUSCULAR | Status: AC
  Filled 2018-03-29: qty 2

## 2018-03-29 MED ORDER — METOPROLOL TARTRATE 5 MG/5ML IV SOLN: 2.0000 mg | INTRAVENOUS | Status: DC | PRN

## 2018-03-29 MED ORDER — SUGAMMADEX SODIUM 200 MG/2ML IV SOLN
INTRAVENOUS | Status: DC | PRN
  Administered 2018-03-29: 150 mg via INTRAVENOUS

## 2018-03-29 MED ORDER — PANTOPRAZOLE SODIUM 40 MG PO TBEC
40.0000 mg | DELAYED_RELEASE_TABLET | Freq: Every day | ORAL | Status: DC
  Administered 2018-03-30 – 2018-04-04 (×6): 40 mg via ORAL
  Filled 2018-03-29 (×7): qty 1

## 2018-03-29 MED ORDER — MAGNESIUM SULFATE 2 GM/50ML IV SOLN: 2.0000 g | Freq: Every day | INTRAVENOUS | Status: DC | PRN

## 2018-03-29 MED ORDER — CEFAZOLIN SODIUM-DEXTROSE 2-4 GM/100ML-% IV SOLN
2.0000 g | Freq: Three times a day (TID) | INTRAVENOUS | Status: AC
  Administered 2018-03-29 (×2): 2 g via INTRAVENOUS
  Filled 2018-03-29 (×3): qty 100

## 2018-03-29 MED ORDER — OXYCODONE HCL 5 MG PO TABS
5.0000 mg | ORAL_TABLET | ORAL | Status: DC | PRN
  Administered 2018-03-30 – 2018-04-04 (×17): 10 mg via ORAL
  Filled 2018-03-29 (×18): qty 2

## 2018-03-29 MED ORDER — PHENYLEPHRINE 40 MCG/ML (10ML) SYRINGE FOR IV PUSH (FOR BLOOD PRESSURE SUPPORT)
PREFILLED_SYRINGE | INTRAVENOUS | Status: AC
  Filled 2018-03-29: qty 10

## 2018-03-29 MED ORDER — ACETAMINOPHEN 325 MG RE SUPP: 325.0000 mg | RECTAL | Status: DC | PRN

## 2018-03-29 MED ORDER — ENOXAPARIN SODIUM 40 MG/0.4ML ~~LOC~~ SOLN
40.0000 mg | SUBCUTANEOUS | Status: DC
  Administered 2018-03-30 – 2018-04-03 (×4): 40 mg via SUBCUTANEOUS
  Filled 2018-03-29 (×6): qty 0.4

## 2018-03-29 MED ORDER — PROMETHAZINE HCL 25 MG/ML IJ SOLN: 6.2500 mg | INTRAMUSCULAR | Status: DC | PRN

## 2018-03-29 MED ORDER — HYDRALAZINE HCL 20 MG/ML IJ SOLN: 5.0000 mg | INTRAMUSCULAR | Status: DC | PRN

## 2018-03-29 MED ORDER — DEXAMETHASONE SODIUM PHOSPHATE 10 MG/ML IJ SOLN
INTRAMUSCULAR | Status: AC
  Filled 2018-03-29: qty 2

## 2018-03-29 MED ORDER — GABAPENTIN 600 MG PO TABS
1200.0000 mg | ORAL_TABLET | Freq: Two times a day (BID) | ORAL | Status: DC
  Administered 2018-03-29 – 2018-04-04 (×13): 1200 mg via ORAL
  Filled 2018-03-29 (×13): qty 2

## 2018-03-29 MED ORDER — GUAIFENESIN-DM 100-10 MG/5ML PO SYRP: 15.0000 mL | ORAL_SOLUTION | ORAL | Status: DC | PRN

## 2018-03-29 MED ORDER — ROCURONIUM BROMIDE 50 MG/5ML IV SOSY
PREFILLED_SYRINGE | INTRAVENOUS | Status: AC
  Filled 2018-03-29: qty 15

## 2018-03-29 MED ORDER — CEFAZOLIN SODIUM-DEXTROSE 2-4 GM/100ML-% IV SOLN
2.0000 g | INTRAVENOUS | Status: DC
  Filled 2018-03-29: qty 100

## 2018-03-29 MED ORDER — HYDROMORPHONE HCL 1 MG/ML IJ SOLN: 0.2500 mg | INTRAMUSCULAR | Status: DC | PRN

## 2018-03-29 MED ORDER — LACTATED RINGERS IV SOLN
INTRAVENOUS | Status: DC | PRN
  Administered 2018-03-29: 10:00:00 via INTRAVENOUS

## 2018-03-29 MED ORDER — ONDANSETRON HCL 4 MG/2ML IJ SOLN
INTRAMUSCULAR | Status: DC | PRN
  Administered 2018-03-29: 4 mg via INTRAVENOUS

## 2018-03-29 MED ORDER — ALUM & MAG HYDROXIDE-SIMETH 200-200-20 MG/5ML PO SUSP: 15.0000 mL | ORAL | Status: DC | PRN

## 2018-03-29 MED ORDER — MORPHINE SULFATE (PF) 2 MG/ML IV SOLN: 2.0000 mg | INTRAVENOUS | Status: DC | PRN

## 2018-03-29 MED ORDER — ROCURONIUM BROMIDE 50 MG/5ML IV SOSY
PREFILLED_SYRINGE | INTRAVENOUS | Status: DC | PRN
  Administered 2018-03-29: 10 mg via INTRAVENOUS
  Administered 2018-03-29: 40 mg via INTRAVENOUS

## 2018-03-29 MED ORDER — DEXAMETHASONE SODIUM PHOSPHATE 10 MG/ML IJ SOLN
INTRAMUSCULAR | Status: DC | PRN
  Administered 2018-03-29: 5 mg via INTRAVENOUS

## 2018-03-29 MED ORDER — PHENYLEPHRINE 40 MCG/ML (10ML) SYRINGE FOR IV PUSH (FOR BLOOD PRESSURE SUPPORT)
PREFILLED_SYRINGE | INTRAVENOUS | Status: DC | PRN
  Administered 2018-03-29: 80 ug via INTRAVENOUS
  Administered 2018-03-29 (×2): 120 ug via INTRAVENOUS
  Administered 2018-03-29: 80 ug via INTRAVENOUS

## 2018-03-29 MED ORDER — LIDOCAINE 2% (20 MG/ML) 5 ML SYRINGE
INTRAMUSCULAR | Status: DC | PRN
  Administered 2018-03-29: 60 mg via INTRAVENOUS

## 2018-03-29 MED ORDER — ONDANSETRON HCL 4 MG/2ML IJ SOLN: 4.0000 mg | Freq: Four times a day (QID) | INTRAMUSCULAR | Status: DC | PRN

## 2018-03-29 MED ORDER — PHENOL 1.4 % MT LIQD: 1.0000 | OROMUCOSAL | Status: DC | PRN

## 2018-03-29 MED ORDER — SODIUM CHLORIDE 0.9% IV SOLUTION: Freq: Once | INTRAVENOUS | Status: DC

## 2018-03-29 MED ORDER — LABETALOL HCL 5 MG/ML IV SOLN: 10.0000 mg | INTRAVENOUS | Status: DC | PRN

## 2018-03-29 MED ORDER — FENTANYL CITRATE (PF) 250 MCG/5ML IJ SOLN
INTRAMUSCULAR | Status: AC
  Filled 2018-03-29: qty 5

## 2018-03-29 MED ORDER — FENTANYL CITRATE (PF) 100 MCG/2ML IJ SOLN
INTRAMUSCULAR | Status: DC | PRN
  Administered 2018-03-29: 25 ug via INTRAVENOUS
  Administered 2018-03-29: 50 ug via INTRAVENOUS
  Administered 2018-03-29: 25 ug via INTRAVENOUS
  Administered 2018-03-29 (×3): 50 ug via INTRAVENOUS

## 2018-03-29 MED ORDER — DOCUSATE SODIUM 100 MG PO CAPS
100.0000 mg | ORAL_CAPSULE | Freq: Every day | ORAL | Status: DC
  Administered 2018-04-02 – 2018-04-04 (×2): 100 mg via ORAL
  Filled 2018-03-29 (×7): qty 1

## 2018-03-29 MED ORDER — LIDOCAINE 2% (20 MG/ML) 5 ML SYRINGE
INTRAMUSCULAR | Status: AC
  Filled 2018-03-29: qty 5

## 2018-03-29 MED ORDER — 0.9 % SODIUM CHLORIDE (POUR BTL) OPTIME
TOPICAL | Status: DC | PRN
  Administered 2018-03-29: 1000 mL

## 2018-03-29 MED ORDER — PROPOFOL 10 MG/ML IV BOLUS
INTRAVENOUS | Status: DC | PRN
  Administered 2018-03-29: 130 mg via INTRAVENOUS

## 2018-03-29 MED ORDER — ACETAMINOPHEN 325 MG PO TABS: 325.0000 mg | ORAL_TABLET | ORAL | Status: DC | PRN

## 2018-03-29 MED ORDER — SODIUM CHLORIDE 0.9 % IV SOLN
INTRAVENOUS | Status: DC
  Administered 2018-03-29 – 2018-03-31 (×3): via INTRAVENOUS

## 2018-03-29 SURGICAL SUPPLY — 59 items
BANDAGE ACE 4X5 VEL STRL LF (GAUZE/BANDAGES/DRESSINGS) ×3 IMPLANT
BANDAGE ACE 6X5 VEL STRL LF (GAUZE/BANDAGES/DRESSINGS) ×3 IMPLANT
BLADE CORE FAN STRYKER (BLADE) ×2 IMPLANT
BLADE SAW GIGLI 510 (BLADE) ×3 IMPLANT
BLADE SAW GIGLI 510MM (BLADE) ×2
BNDG COHESIVE 6X5 TAN STRL LF (GAUZE/BANDAGES/DRESSINGS) ×3 IMPLANT
BNDG GAUZE ELAST 4 BULKY (GAUZE/BANDAGES/DRESSINGS) ×6 IMPLANT
CANISTER SUCT 3000ML PPV (MISCELLANEOUS) ×3 IMPLANT
CLIP VESOCCLUDE MED 6/CT (CLIP) ×3 IMPLANT
COVER SURGICAL LIGHT HANDLE (MISCELLANEOUS) ×3 IMPLANT
COVER WAND RF STERILE (DRAPES) ×3 IMPLANT
DRAIN CHANNEL 19F RND (DRAIN) IMPLANT
DRAPE HALF SHEET 40X57 (DRAPES) ×3 IMPLANT
DRAPE INCISE IOBAN 66X45 STRL (DRAPES) IMPLANT
DRAPE ORTHO SPLIT 77X108 STRL (DRAPES) ×6
DRAPE SURG ORHT 6 SPLT 77X108 (DRAPES) ×2 IMPLANT
DRESSING PREVENA PLUS CUSTOM (GAUZE/BANDAGES/DRESSINGS) IMPLANT
DRSG ADAPTIC 3X8 NADH LF (GAUZE/BANDAGES/DRESSINGS) ×3 IMPLANT
DRSG PREVENA PLUS CUSTOM (GAUZE/BANDAGES/DRESSINGS)
ELECT CAUTERY BLADE 6.4 (BLADE) ×3 IMPLANT
ELECT REM PT RETURN 9FT ADLT (ELECTROSURGICAL) ×3
ELECTRODE REM PT RTRN 9FT ADLT (ELECTROSURGICAL) ×1 IMPLANT
EVACUATOR SILICONE 100CC (DRAIN) IMPLANT
GAUZE 4X4 16PLY RFD (DISPOSABLE) ×3 IMPLANT
GAUZE SPONGE 4X4 12PLY STRL (GAUZE/BANDAGES/DRESSINGS) ×4 IMPLANT
GAUZE XEROFORM 5X9 LF (GAUZE/BANDAGES/DRESSINGS) IMPLANT
GLOVE BIOGEL PI IND STRL 7.0 (GLOVE) IMPLANT
GLOVE BIOGEL PI IND STRL 7.5 (GLOVE) ×1 IMPLANT
GLOVE BIOGEL PI INDICATOR 7.0 (GLOVE) ×2
GLOVE BIOGEL PI INDICATOR 7.5 (GLOVE) ×4
GLOVE ECLIPSE 7.0 STRL STRAW (GLOVE) ×2 IMPLANT
GLOVE SS BIOGEL STRL SZ 7 (GLOVE) IMPLANT
GLOVE SUPERSENSE BIOGEL SZ 7 (GLOVE) ×2
GLOVE SURG SS PI 7.0 STRL IVOR (GLOVE) ×2 IMPLANT
GLOVE SURG SS PI 7.5 STRL IVOR (GLOVE) ×5 IMPLANT
GOWN STRL REUS W/ TWL LRG LVL3 (GOWN DISPOSABLE) ×2 IMPLANT
GOWN STRL REUS W/ TWL XL LVL3 (GOWN DISPOSABLE) ×1 IMPLANT
GOWN STRL REUS W/TWL LRG LVL3 (GOWN DISPOSABLE) ×6
GOWN STRL REUS W/TWL XL LVL3 (GOWN DISPOSABLE) ×3
KIT BASIN OR (CUSTOM PROCEDURE TRAY) ×3 IMPLANT
KIT TURNOVER KIT B (KITS) ×3 IMPLANT
NS IRRIG 1000ML POUR BTL (IV SOLUTION) ×3 IMPLANT
PACK GENERAL/GYN (CUSTOM PROCEDURE TRAY) ×3 IMPLANT
PAD ARMBOARD 7.5X6 YLW CONV (MISCELLANEOUS) ×6 IMPLANT
PREVENA RESTOR ARTHOFORM 46X30 (CANNISTER) IMPLANT
STAPLER VISISTAT 35W (STAPLE) ×3 IMPLANT
STOCKINETTE IMPERVIOUS LG (DRAPES) ×3 IMPLANT
SUT ETHILON 2 0 PSLX (SUTURE) ×2 IMPLANT
SUT ETHILON 3 0 PS 1 (SUTURE) IMPLANT
SUT SILK 0 TIES 10X30 (SUTURE) ×7 IMPLANT
SUT SILK 2 0 (SUTURE)
SUT SILK 2-0 18XBRD TIE 12 (SUTURE) IMPLANT
SUT SILK 3 0 (SUTURE)
SUT SILK 3-0 18XBRD TIE 12 (SUTURE) IMPLANT
SUT VIC AB 2-0 CT1 18 (SUTURE) ×10 IMPLANT
TAPE UMBILICAL COTTON 1/8X30 (MISCELLANEOUS) IMPLANT
TOWEL GREEN STERILE (TOWEL DISPOSABLE) ×3 IMPLANT
UNDERPAD 30X30 (UNDERPADS AND DIAPERS) ×3 IMPLANT
WATER STERILE IRR 1000ML POUR (IV SOLUTION) ×3 IMPLANT

## 2018-03-29 NOTE — Op Note (Signed)
    Patient name: Emma [name] MRN: [id number] DOB: 03/28/1945 Sex: female  03/29/2018 Pre-operative Diagnosis: Ischemic right leg Post-operative diagnosis:  Same Surgeon:  Durene Cal Assistants:  Danford Bad Procedure:   Right above-knee amputation Anesthesia: General Blood Loss:  100 cc Specimens: Right leg  Findings: Viable muscle and tissue at the amputation site  Indications: The patient has recently undergone attempt at limb salvage.  Despite multiple revisions, she has developed an ischemic right leg and amputation has been recommended.  Procedure:  The patient was identified in the holding area and taken to [facility] OR ROOM 16  The patient was then placed supine on the table. general anesthesia was administered.  The patient was prepped and draped in the usual sterile fashion.  A time out was called and antibiotics were administered.  A fishmouth incision was made in the right thigh.  Cautery was used about subcutaneous tissue down to the fascia which was then divided with cautery.  The femur was circumferentially dissected free.  A periosteal elevator was used to elevate the periosteum and the femur was then transected with a Gigli saw, beveling the anterior surface.  I then smooth the bone edge with a rasp.  I continued to divide the muscle.  I isolated the neurovascular bundle and divided it, ligating the bundle proximal to the cut edge of the femur with 0 silk tie.  The leg was then removed and sent as a specimen.  I felt that I could not get adequate tension-free closure and so I elected to resect the femur another inch using a reciprocating saw.  The bone edge was then smoothed with a rasp.  I inspected the neurovascular bundle and it was proximal to the cut edge of the femur.  The wound was then irrigated.  Hemostasis was achieved.  The fascia was then reapproximated with interrupted 3-0 Vicryl and the skin was closed with staples.  Sterile dressings were applied.  There were no  immediate complications.   Disposition: To PACU stable.   Juleen China, M.D., [facility] [place] Office: [phone] Pager:  [phone]

## 2018-03-29 NOTE — Anesthesia Postprocedure Evaluation (Signed)
Anesthesia Post Note  Patient: Emma [name]  Procedure(s) Performed: Right lower extremity angiogram, Right Femoral Peroneal artery bypass, Angioplasty Right Femoral Peroneal bypass, Thrombectomy of Femoral peroneal artery (Right Leg Lower)     Patient location during evaluation: PACU Anesthesia Type: General Level of consciousness: sedated and patient cooperative Pain management: pain level controlled Vital Signs Assessment: post-procedure vital signs reviewed and stable Respiratory status: spontaneous breathing Cardiovascular status: stable Anesthetic complications: no    Last Vitals:  Vitals:   03/28/18 1700 03/28/18 1925  BP:  118/65  Pulse: 98 (!) 101  Resp: 19 18  Temp:  37.4 C  SpO2: 95% 100%    Last Pain:  Vitals:   03/28/18 2136  TempSrc:   PainSc: Asleep                 Lewie Loron

## 2018-03-29 NOTE — Progress Notes (Signed)
CRITICAL VALUE ALERT  Critical Value:  Hgb 6.2  Date & Time Notied:  03/29/18 0856  Provider Notified:[id number]  Orders Received/Actions taken: MD made aware

## 2018-03-29 NOTE — Transfer of Care (Signed)
Immediate Anesthesia Transfer of Care Note  Patient: [facility]  Procedure(s) Performed: AMPUTATION ABOVE KNEE (Right Leg Upper)  Patient Location: PACU  Anesthesia Type:General  Level of Consciousness: awake, alert  and oriented  Airway & Oxygen Therapy: Patient Spontanous Breathing and Patient connected to face mask oxygen  Post-op Assessment: Report given to RN and Post -op Vital signs reviewed and stable  Post vital signs: Reviewed and stable  Last Vitals:  Vitals Value Taken Time  BP 153/65 03/29/2018 12:04 PM  Temp    Pulse    Resp 22 03/29/2018 12:04 PM  SpO2    Vitals shown include unvalidated device data.  Last Pain:  Vitals:   03/29/18 0954  TempSrc: Oral  PainSc:       Patients Stated Pain Goal: 3 (03/29/18 0830)  Complications: No apparent anesthesia complications

## 2018-03-29 NOTE — Progress Notes (Signed)
Patient is scheduled for right AKA today 03/29/2018.  Hold on formal consult at this time until surgery completed and follow-up therapies

## 2018-03-29 NOTE — Progress Notes (Signed)
Spoke with patient regarding above knee amputation.  Discussed details of procedure and recovery.  Her Hb is 6.2 and will get blood prior to surgery.  Durene Cal

## 2018-03-29 NOTE — Progress Notes (Signed)
  Progress Note    03/29/2018 8:39 AM 2 Days Post-Op  Subjective: Having persistent right foot pain  Vitals:   03/28/18 1925 03/29/18 0830  BP: 118/65 117/62  Pulse: (!) 101 96  Resp: 18 19  Temp: 99.4 F (37.4 C) 98.8 F (37.1 C)  SpO2: 100% 96%    Physical Exam: Awake alert oriented Dressings on the right lower extremity clean dry intact Right foot is mottled and cool  CBC    Component Value Date/Time   WBC 9.4 03/28/2018 0443   RBC 3.02 (L) 03/28/2018 0443   HGB 8.9 (L) 03/28/2018 0443   HCT 27.6 (L) 03/28/2018 0443   PLT 234 03/28/2018 0443   MCV 91.4 03/28/2018 0443   MCH 29.5 03/28/2018 0443   MCHC 32.2 03/28/2018 0443   RDW 15.9 (H) 03/28/2018 0443    BMET    Component Value Date/Time   NA 140 03/27/2018 0646   K 4.4 03/27/2018 0646   CL 105 03/27/2018 0646   CO2 24 03/27/2018 0646   GLUCOSE 252 (H) 03/27/2018 0646   BUN 11 03/27/2018 0646   CREATININE 0.99 03/27/2018 0646   CALCIUM 8.4 (L) 03/27/2018 0646   GFRNONAA 58 (L) 03/27/2018 0646   GFRAA >60 03/27/2018 0646    INR    Component Value Date/Time   INR 1.0 03/25/2018 0253     Intake/Output Summary (Last 24 hours) at 03/29/2018 0839 Last data filed at 03/29/2018 0800 Gross per 24 hour  Intake 240 ml  Output 2550 ml  Net -2310 ml     Assessment:  73 y.o. female is here with acute on chronic right lower extremity ischemia with failed bypass procedures.  Plan: I discussed that she will need above-knee amputation we will proceed today.  She is n.p.o.  Apolinar Junes C. Randie Heinz, MD [facility] Office: [phone] Pager: [phone]  03/29/2018 8:39 AM

## 2018-03-29 NOTE — Anesthesia Postprocedure Evaluation (Signed)
Anesthesia Post Note  Patient: Emma [name]  Procedure(s) Performed: AMPUTATION ABOVE KNEE (Right Leg Upper)     Patient location during evaluation: PACU Anesthesia Type: General Level of consciousness: awake and alert Pain management: pain level controlled Vital Signs Assessment: post-procedure vital signs reviewed and stable Respiratory status: spontaneous breathing, nonlabored ventilation, respiratory function stable and patient connected to nasal cannula oxygen Cardiovascular status: blood pressure returned to baseline and stable Postop Assessment: no apparent nausea or vomiting Anesthetic complications: no    Last Vitals:  Vitals:   03/29/18 1250 03/29/18 1539  BP: (!) 154/66 (!) 159/69  Pulse: 100 93  Resp: (!) 28 18  Temp: 36.6 C 37.1 C  SpO2: 100% 97%    Last Pain:  Vitals:   03/29/18 1539  TempSrc: Oral  PainSc:                  Kennieth Rad

## 2018-03-29 NOTE — Anesthesia Preprocedure Evaluation (Addendum)
Anesthesia Evaluation  Patient identified by MRN, date of birth, ID band Patient awake    Reviewed: Allergy & Precautions, H&P , NPO status , Patient's Chart, lab work & pertinent test results  History of Anesthesia Complications Negative for: history of anesthetic complications  Airway Mallampati: III  TM Distance: >3 FB Neck ROM: Full    Dental  (+) Edentulous Upper, Edentulous Lower   Pulmonary Current Smoker,    breath sounds clear to auscultation       Cardiovascular hypertension, Pt. on medications (-) angina+ Peripheral Vascular Disease   Rhythm:Regular Rate:Normal     Neuro/Psych negative neurological ROS     GI/Hepatic negative GI ROS, Neg liver ROS,   Endo/Other  diabetes, Oral Hypoglycemic AgentsHypothyroidism   Renal/GU negative Renal ROS     Musculoskeletal   Abdominal   Peds  Hematology  (+) Blood dyscrasia, anemia ,   Anesthesia Other Findings   Reproductive/Obstetrics                            Lab Results  Component Value Date   WBC 9.5 03/29/2018   HGB 6.2 (LL) 03/29/2018   HCT 20.0 (L) 03/29/2018   MCV 95.7 03/29/2018   PLT 245 03/29/2018   Lab Results  Component Value Date   CREATININE 0.99 03/27/2018   BUN 11 03/27/2018   NA 140 03/27/2018   K 4.4 03/27/2018   CL 105 03/27/2018   CO2 24 03/27/2018    Anesthesia Physical  Anesthesia Plan  ASA: III  Anesthesia Plan: General   Post-op Pain Management:    Induction: Intravenous  PONV Risk Score and Plan: 2 and Ondansetron and Dexamethasone  Airway Management Planned: Oral ETT  Additional Equipment:   Intra-op Plan:   Post-operative Plan: Extubation in OR  Informed Consent: I have reviewed the patients History and Physical, chart, labs and discussed the procedure including the risks, benefits and alternatives for the proposed anesthesia with the patient or authorized representative who has  indicated his/her understanding and acceptance.     Dental advisory given  Plan Discussed with: CRNA  Anesthesia Plan Comments:         Anesthesia Quick Evaluation

## 2018-03-29 NOTE — Anesthesia Procedure Notes (Signed)
Procedure Name: Intubation Date/Time: 03/29/2018 10:32 AM Performed by: Candis Shine, CRNA Pre-anesthesia Checklist: Patient identified, Emergency Drugs available, Suction available and Patient being monitored Patient Re-evaluated:Patient Re-evaluated prior to induction Oxygen Delivery Method: Circle System Utilized Preoxygenation: Pre-oxygenation with 100% oxygen Induction Type: IV induction Ventilation: Mask ventilation without difficulty Laryngoscope Size: Mac and 3 Grade View: Grade I Tube type: Oral Tube size: 7.0 mm Number of attempts: 1 Airway Equipment and Method: Stylet Placement Confirmation: ETT inserted through vocal cords under direct vision,  positive ETCO2 and breath sounds checked- equal and bilateral Secured at: 22 cm Tube secured with: Tape Dental Injury: Teeth and Oropharynx as per pre-operative assessment

## 2018-03-29 NOTE — Progress Notes (Signed)
Inpatient Rehabilitation Admissions Coordinator  Inpt rehab consult received. OR today. I will follow up on Monday.  Ottie Glazier, RN, MSN Rehab Admissions Coordinator [phone] 03/29/2018 3:55 PM

## 2018-03-30 ENCOUNTER — Encounter (HOSPITAL_COMMUNITY): Payer: Self-pay | Admitting: Surgery

## 2018-03-30 LAB — TYPE AND SCREEN
ABO/RH(D): O POS
Antibody Screen: NEGATIVE
UNIT DIVISION: 0
Unit division: 0
Unit division: 0
Unit division: 0

## 2018-03-30 LAB — BASIC METABOLIC PANEL
Anion gap: 12 (ref 5–15)
BUN: 8 mg/dL (ref 8–23)
CO2: 18 mmol/L — AB (ref 22–32)
Calcium: 7.4 mg/dL — ABNORMAL LOW (ref 8.9–10.3)
Chloride: 108 mmol/L (ref 98–111)
Creatinine, Ser: 0.95 mg/dL (ref 0.44–1.00)
GFR calc Af Amer: >60 mL/min (ref >60)
GFR calc non Af Amer: >60 mL/min (ref >60)
Glucose, Bld: 148 mg/dL — ABNORMAL HIGH (ref 70–99)
Potassium: 3.6 mmol/L (ref 3.5–5.1)
Sodium: 138 mmol/L (ref 135–145)

## 2018-03-30 LAB — BPAM RBC
BLOOD PRODUCT EXPIRATION DATE: 202004132359
Blood Product Expiration Date: 202004092359
Blood Product Expiration Date: 202004092359
Blood Product Expiration Date: 202004132359
ISSUE DATE / TIME: 202003130929
ISSUE DATE / TIME: 202003111037
ISSUE DATE / TIME: 202003111037
ISSUE DATE / TIME: 202003130929
UNIT TYPE AND RH: 5100
Unit Type and Rh: 5100
Unit Type and Rh: 5100
Unit Type and Rh: 5100

## 2018-03-30 LAB — GLUCOSE, CAPILLARY
GLUCOSE-CAPILLARY: 96 mg/dL (ref 70–99)
Glucose-Capillary: 151 mg/dL — ABNORMAL HIGH (ref 70–99)
Glucose-Capillary: 375 mg/dL — ABNORMAL HIGH (ref 70–99)
Glucose-Capillary: 304 mg/dL — ABNORMAL HIGH (ref 70–99)

## 2018-03-30 LAB — CBC
HCT: 24.8 % — ABNORMAL LOW (ref 36.0–46.0)
Hemoglobin: 8.1 g/dL — ABNORMAL LOW (ref 12.0–15.0)
MCH: 29.8 pg (ref 26.0–34.0)
MCHC: 32.7 g/dL (ref 30.0–36.0)
MCV: 91.2 fL (ref 80.0–100.0)
Platelets: 263 10*3/uL (ref 150–400)
RBC: 2.72 MIL/uL — AB (ref 3.87–5.11)
RDW: 15.1 % (ref 11.5–15.5)
WBC: 10.7 10*3/uL — ABNORMAL HIGH (ref 4.0–10.5)
nRBC: 0 % (ref 0.0–0.2)

## 2018-03-30 MED ORDER — ENSURE ENLIVE PO LIQD: 237.0000 mL | Freq: Two times a day (BID) | ORAL | Status: DC

## 2018-03-30 NOTE — Evaluation (Signed)
Occupational Therapy Evaluation Patient Details Name: Emma [name] MRN: [id number] DOB: [birth year] Today's Date: 03/30/2018    History of Present Illness Pt is a 73 y.o. female admitted 03/23/18 with critical R LE ischemia. Underwent R femoral BPG 3/9 and distal femoral BPG 3/10. Pt with new onset of R foot pulselessness 3/11 and taken back to OR. PMH includes current smoker, back sx (2016), PVD, DM, HTN.   Clinical Impression   Pt seen for OT re-evaluation in conjunction with PT s/p new R AKA. She currently requires increased assistance for LB ADL and is lethargic with increased processing time required (likely due to medications). She was able to complete simulated ADL at EOB today. Feel she is an excellent candidate for CIR level therapies post-acute D/C. Goals updated appropriately. Will continue to follow.     Follow Up Recommendations  CIR    Equipment Recommendations  Tub/shower bench;3 in 1 bedside commode;Wheelchair (measurements OT);Wheelchair cushion (measurements OT)    Recommendations for Other Services Rehab consult     Precautions / Restrictions Precautions Precautions: Fall Precaution Comments: RLE new AKA Restrictions Weight Bearing Restrictions: No      Mobility Bed Mobility Overal bed mobility: Needs Assistance Bed Mobility: Supine to Sit;Sit to Supine     Supine to sit: Min assist;+2 for physical assistance Sit to supine: Mod assist;+2 for physical assistance   General bed mobility comments: Min assist to elevat trunk and rotate to EOB, assist to reposition back in bed  Transfers                 General transfer comment: deferred     Balance Overall balance assessment: Needs assistance Sitting-balance support: Bilateral upper extremity supported Sitting balance-Leahy Scale: Fair Sitting balance - Comments: able to tolerate EOB ~8 minutes                                   ADL either performed or assessed with clinical  judgement   ADL Overall ADL's : Needs assistance/impaired Eating/Feeding: Independent;Sitting   Grooming: Wash/dry face;Wash/dry hands;Sitting;Set up   Upper Body Bathing: Sitting;Min guard Upper Body Bathing Details (indicate cue type and reason): guarding due to changes in center of gravity Lower Body Bathing: Sitting/lateral leans;Maximal assistance   Upper Body Dressing : Set up;Sitting   Lower Body Dressing: Sitting/lateral leans;Maximal assistance                 General ADL Comments: Unable to assess mobility other than EOB.      Vision Baseline Vision/History: Wears glasses Wears Glasses: Reading only Patient Visual Report: No change from baseline Vision Assessment?: No apparent visual deficits     Perception     Praxis      Pertinent Vitals/Pain Pain Assessment: 0-10 Pain Score: 5  Pain Location: R LE Pain Descriptors / Indicators: Grimacing Pain Intervention(s): Monitored during session     Hand Dominance Left   Extremity/Trunk Assessment Upper Extremity Assessment Upper Extremity Assessment: Overall WFL for tasks assessed   Lower Extremity Assessment Lower Extremity Assessment: RLE deficits/detail RLE Deficits / Details: new AKA RLE RLE: Unable to fully assess due to pain   Cervical / Trunk Assessment Cervical / Trunk Assessment: (increased body habitus)   Communication Communication Communication: No difficulties   Cognition Arousal/Alertness: Lethargic Behavior During Therapy: Flat affect Overall Cognitive Status: Impaired/Different from baseline Area of Impairment: Following commands;Attention;Problem solving  Current Attention Level: Selective   Following Commands: Follows one step commands with increased time;Follows one step commands inconsistently     Problem Solving: Slow processing;Decreased initiation;Difficulty sequencing General Comments: some lethargy and decreased processing speed   General  Comments       Exercises Exercises: General Lower Extremity General Exercises - Lower Extremity Short Arc Quad: (educated on performance residual limb) Hip Flexion/Marching: (educated on performance residual limb) Other Exercises Other Exercises: educated on sensory stimulation techniques for residual limb   Shoulder Instructions      Home Living Family/patient expects to be discharged to:: Private residence Living Arrangements: Alone Available Help at Discharge: Family;Friend(s);Available PRN/intermittently Type of Home: Apartment Home Access: Level entry     Home Layout: One level     Bathroom Shower/Tub: Chief Strategy Officer: Handicapped height     Home Equipment: Environmental consultant - 2 wheels;Cane - single point;Bedside commode   Additional Comments: Reports family will be able to assist with driving/errands      Prior Functioning/Environment Level of Independence: Independent        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), drives        OT Problem List: Impaired balance (sitting and/or standing);Pain;Decreased knowledge of use of DME or AE;Decreased strength;Decreased activity tolerance      OT Treatment/Interventions: Self-care/ADL training;DME and/or AE instruction;Patient/family education;Balance training;Therapeutic activities    OT Goals(Current goals can be found in the care plan section) Acute Rehab OT Goals Patient Stated Goal: to get back home and return to work OT Goal Formulation: With patient Time For Goal Achievement: 04/10/18 Potential to Achieve Goals: Good  OT Frequency: Min 2X/week   Barriers to D/C:            Co-evaluation PT/OT/SLP Co-Evaluation/Treatment: Yes Reason for Co-Treatment: For patient/therapist safety PT goals addressed during session: Mobility/safety with mobility OT goals addressed during session: ADL's and self-care      AM-PAC OT "6 Clicks" Daily Activity     Outcome Measure Help from  another person eating meals?: None Help from another person taking care of personal grooming?: A Little Help from another person toileting, which includes using toliet, bedpan, or urinal?: A Lot Help from another person bathing (including washing, rinsing, drying)?: A Lot Help from another person to put on and taking off regular upper body clothing?: A Lot Help from another person to put on and taking off regular lower body clothing?: Total 6 Click Score: 14   End of Session Nurse Communication: Mobility status  Activity Tolerance: Patient limited by lethargy Patient left: in bed;with call bell/phone within reach;with bed alarm set  OT Visit Diagnosis: Pain;Muscle weakness (generalized) (M62.81);Other symptoms and signs involving cognitive function Pain - Right/Left: Right Pain - part of body: Leg                Time: 0867-6195 OT Time Calculation (min): 19 min Charges:  OT General Charges $OT Visit: 1 Visit OT Evaluation $OT Re-eval: 1 Re-eval  Derrell Lolling, OTR/L Acute Rehabilitation Services Office [phone]   Emma [name] A Emma [name] 03/30/2018, 9:40 AM

## 2018-03-30 NOTE — Progress Notes (Addendum)
  Progress Note    03/30/2018 8:46 AM 1 Day Post-Op  Subjective:  Some burning pain overnight L plantar surface of foot  = Vitals:   03/30/18 0500 03/30/18 0820  BP: (!) 127/56 132/61  Pulse: 78 78  Resp: 17 15  Temp: 98.9 F (37.2 C) 98.5 F (36.9 C)  SpO2: 98% 100%    Physical Exam: Incisions:  R AKA dressing left in place, no breakthrough bleeding  L ATA brisk by doppler   CBC    Component Value Date/Time   WBC 10.7 (H) 03/30/2018 0305   RBC 2.72 (L) 03/30/2018 0305   HGB 8.1 (L) 03/30/2018 0305   HCT 24.8 (L) 03/30/2018 0305   PLT 263 03/30/2018 0305   MCV 91.2 03/30/2018 0305   MCH 29.8 03/30/2018 0305   MCHC 32.7 03/30/2018 0305   RDW 15.1 03/30/2018 0305    BMET    Component Value Date/Time   NA 138 03/30/2018 0305   K 3.6 03/30/2018 0305   CL 108 03/30/2018 0305   CO2 18 (L) 03/30/2018 0305   GLUCOSE 148 (H) 03/30/2018 0305   BUN 8 03/30/2018 0305   CREATININE 0.95 03/30/2018 0305   CALCIUM 7.4 (L) 03/30/2018 0305   GFRNONAA >60 03/30/2018 0305   GFRAA >60 03/30/2018 0305    INR    Component Value Date/Time   INR 1.0 03/25/2018 0253     Intake/Output Summary (Last 24 hours) at 03/30/2018 0846 Last data filed at 03/30/2018 0650 Gross per 24 hour  Intake 2148 ml  Output 2725 ml  Net -577 ml     Assessment/Plan:  73 y.o. female is s/p right above knee amputation  1 Day Post-Op  - R AKA dressing change tomorrow - PT/OT to eval and treat - Inpt rehab consulted - L ATA brisk by doppler; known L SFA occlusion, no indication for revascularization without tissue loss or rest pain   Emma Rutter, PA-C [facility] [phone] 03/30/2018 8:46 AM  I have examined the patient, reviewed and agree with above.  Comfortable this morning.  Pain easily controlled in her right above-knee amputation.  Does report some burning discomfort on her bottom of her left foot but her foot is well perfused.  Will check amputation  incision tomorrow  Emma Began, MD 03/30/2018 10:18 AM

## 2018-03-30 NOTE — Evaluation (Signed)
Physical Therapy Evaluation Patient Details Name: Emma [name] MRN: [id number] DOB: March 14, 1945 Today's Date: 03/30/2018   History of Present Illness  Pt is a 73 y.o. female admitted 03/23/18 with critical R LE ischemia. Underwent R femoral BPG 3/9 and distal femoral BPG 3/10. Pt with new onset of R foot pulselessness 3/11 and taken back to OR, now s/p Right AKA 3/13. PMH includes current smoker, back sx (2016), PVD, DM, HTN.  Clinical Impression  Orders received for PT re-evaluation after newly performed right AKA.Marland Kitchen Patient demonstrates deficits in functional mobility as indicated below. Will benefit from continued skilled PT to address deficits and maximize function. Will see as indicated and progress as tolerated.  Prior to admission patient very independent, given new AKA patient will benefit from comprehensive therapies to maximize recovery and learn new strategies for independence with functional mobility and self care. Patient is very motivated and anticipate will progress with with intensive therapies.    Follow Up Recommendations CIR    Equipment Recommendations  Wheelchair (measurements PT);Wheelchair cushion (measurements PT)    Recommendations for Other Services       Precautions / Restrictions Precautions Precautions: Fall Precaution Comments: RLE new AKA Restrictions Weight Bearing Restrictions: No      Mobility  Bed Mobility Overal bed mobility: Needs Assistance       Supine to sit: Min assist;+2 for physical assistance Sit to supine: Mod assist;+2 for physical assistance   General bed mobility comments: Min assist to elevat trunk and rotate to EOB, assist to reposition back in bed  Transfers                 General transfer comment: deferred   Ambulation/Gait                Stairs            Wheelchair Mobility    Modified Rankin (Stroke Patients Only)       Balance Overall balance assessment: Needs  assistance Sitting-balance support: Bilateral upper extremity supported Sitting balance-Leahy Scale: Fair Sitting balance - Comments: able to tolerate EOB ~8 minutes                                     Pertinent Vitals/Pain Pain Assessment: Faces Pain Score: 5  Pain Location: R LE Pain Descriptors / Indicators: Grimacing Pain Intervention(s): Monitored during session    Home Living Family/patient expects to be discharged to:: Private residence Living Arrangements: Alone Available Help at Discharge: Family;Friend(s);Available PRN/intermittently Type of Home: Apartment Home Access: Level entry     Home Layout: One level Home Equipment: Walker - 2 wheels;Cane - single point;Bedside commode Additional Comments: Reports family will be able to assist with driving/errands    Prior Function                 Hand Dominance        Extremity/Trunk Assessment        Lower Extremity Assessment Lower Extremity Assessment: RLE deficits/detail RLE Deficits / Details: new AKA RLE RLE: Unable to fully assess due to pain    Cervical / Trunk Assessment Cervical / Trunk Assessment: (increased body habitus)  Communication      Cognition Arousal/Alertness: Lethargic Behavior During Therapy: Flat affect Overall Cognitive Status: Impaired/Different from baseline Area of Impairment: Following commands;Attention;Problem solving                   Current  Attention Level: Selective   Following Commands: Follows one step commands with increased time;Follows one step commands inconsistently     Problem Solving: Slow processing;Decreased initiation;Difficulty sequencing        General Comments      Exercises General Exercises - Lower Extremity Short Arc Quad: (educated on performance residual limb) Hip Flexion/Marching: (educated on performance residual limb) Other Exercises Other Exercises: educated on sensory stimulation techniques for residual limb    Assessment/Plan    PT Assessment    PT Problem List         PT Treatment Interventions DME instruction;Gait training;Stair training;Functional mobility training;Therapeutic activities;Therapeutic exercise;Balance training;Patient/family education    PT Goals (Current goals can be found in the Care Plan section)  Acute Rehab PT Goals Patient Stated Goal: to get back home and return to work PT Goal Formulation: With patient Time For Goal Achievement: 04/13/18 Potential to Achieve Goals: Good    Frequency Min 3X/week   Barriers to discharge        Co-evaluation PT/OT/SLP Co-Evaluation/Treatment: Yes Reason for Co-Treatment: For patient/therapist safety PT goals addressed during session: Mobility/safety with mobility OT goals addressed during session: ADL's and self-care       AM-PAC PT "6 Clicks" Mobility  Outcome Measure Help needed turning from your back to your side while in a flat bed without using bedrails?: A Little Help needed moving from lying on your back to sitting on the side of a flat bed without using bedrails?: A Little Help needed moving to and from a bed to a chair (including a wheelchair)?: A Lot Help needed standing up from a chair using your arms (e.g., wheelchair or bedside chair)?: Total Help needed to walk in hospital room?: Total Help needed climbing 3-5 steps with a railing? : Total 6 Click Score: 11    End of Session Equipment Utilized During Treatment: Oxygen Activity Tolerance: Patient tolerated treatment well;Patient limited by fatigue;Patient limited by lethargy;Patient limited by pain Patient left: in bed;with call bell/phone within reach;with bed alarm set Nurse Communication: Mobility status PT Visit Diagnosis: Other abnormalities of gait and mobility (R26.89);Pain Pain - Right/Left: Right Pain - part of body: Leg;Ankle and joints of foot    Time: 6815-9470 PT Time Calculation (min) (ACUTE ONLY): 19 min   Charges:   PT  Evaluation $PT Re-evaluation: 1 Re-eval          Charlotte Crumb, PT DPT  Board Certified Neurologic Specialist Acute Rehabilitation Services Pager [phone] Office [phone]   Fabio Asa 03/30/2018, 9:07 AM

## 2018-03-30 NOTE — Progress Notes (Signed)
Pt just complained of burning pain rated at 5/10 on her right foot. Patient refuses pain medication and advised to wait until MD is notified. Will continue to monitor.

## 2018-03-31 LAB — CBC
HCT: 26.2 % — ABNORMAL LOW (ref 36.0–46.0)
Hemoglobin: 8.2 g/dL — ABNORMAL LOW (ref 12.0–15.0)
MCH: 28.8 pg (ref 26.0–34.0)
MCHC: 31.3 g/dL (ref 30.0–36.0)
MCV: 91.9 fL (ref 80.0–100.0)
Platelets: 335 10*3/uL (ref 150–400)
RBC: 2.85 MIL/uL — ABNORMAL LOW (ref 3.87–5.11)
RDW: 15 % (ref 11.5–15.5)
WBC: 8.5 10*3/uL (ref 4.0–10.5)
nRBC: 0 % (ref 0.0–0.2)

## 2018-03-31 LAB — GLUCOSE, CAPILLARY
Glucose-Capillary: 203 mg/dL — ABNORMAL HIGH (ref 70–99)
Glucose-Capillary: 215 mg/dL — ABNORMAL HIGH (ref 70–99)
Glucose-Capillary: 224 mg/dL — ABNORMAL HIGH (ref 70–99)
Glucose-Capillary: 315 mg/dL — ABNORMAL HIGH (ref 70–99)

## 2018-03-31 LAB — BASIC METABOLIC PANEL
Anion gap: 7 (ref 5–15)
BUN: 9 mg/dL (ref 8–23)
CALCIUM: 7.6 mg/dL — AB (ref 8.9–10.3)
CO2: 23 mmol/L (ref 22–32)
Chloride: 108 mmol/L (ref 98–111)
Creatinine, Ser: 0.81 mg/dL (ref 0.44–1.00)
GFR calc Af Amer: >60 mL/min (ref >60)
GFR calc non Af Amer: >60 mL/min (ref >60)
Glucose, Bld: 235 mg/dL — ABNORMAL HIGH (ref 70–99)
Potassium: 3.5 mmol/L (ref 3.5–5.1)
Sodium: 138 mmol/L (ref 135–145)

## 2018-03-31 MED ORDER — MORPHINE SULFATE (PF) 2 MG/ML IV SOLN: 2.0000 mg | INTRAVENOUS | Status: DC | PRN

## 2018-03-31 NOTE — Progress Notes (Addendum)
Patient's Foley was removed at 0600 and patient had voided <50 ml.  Bladder scan was 532 ml.   Per Dr. Arbie Cookey, MD verbal order 14 Fr Foley was inserted by Alferd Patee and Misty Stanley, RN.  Per Dr. Arbie Cookey, MD discontinue Foley 04/01/18 0500 and attempt voiding trial.  Patient tolerated procedure well, 600 ml of urine drained out after cath procedure.

## 2018-03-31 NOTE — Progress Notes (Addendum)
  Progress Note    03/31/2018 8:21 AM 2 Days Post-Op  Subjective:  L foot burning off and on.  RLE phantom pain off and on   Vitals:   03/31/18 0321 03/31/18 0753  BP: (!) 144/74 (!) 155/69  Pulse: 73 82  Resp: 19 (!) 23  Temp: 98.7 F (37.1 C) 98.5 F (36.9 C)  SpO2: 100% 100%    Physical Exam: Incisions:  R AKA incision without active drainage; R groin incision unremarkable; R medial thigh incision c/d/i   CBC    Component Value Date/Time   WBC 8.5 03/31/2018 0304   RBC 2.85 (L) 03/31/2018 0304   HGB 8.2 (L) 03/31/2018 0304   HCT 26.2 (L) 03/31/2018 0304   PLT 335 03/31/2018 0304   MCV 91.9 03/31/2018 0304   MCH 28.8 03/31/2018 0304   MCHC 31.3 03/31/2018 0304   RDW 15.0 03/31/2018 0304    BMET    Component Value Date/Time   NA 138 03/31/2018 0304   K 3.5 03/31/2018 0304   CL 108 03/31/2018 0304   CO2 23 03/31/2018 0304   GLUCOSE 235 (H) 03/31/2018 0304   BUN 9 03/31/2018 0304   CREATININE 0.81 03/31/2018 0304   CALCIUM 7.6 (L) 03/31/2018 0304   GFRNONAA >60 03/31/2018 0304   GFRAA >60 03/31/2018 0304    INR    Component Value Date/Time   INR 1.0 03/25/2018 0253     Intake/Output Summary (Last 24 hours) at 03/31/2018 6967 Last data filed at 03/31/2018 0554 Gross per 24 hour  Intake 325 ml  Output 2100 ml  Net -1775 ml     Assessment/Plan:  74 y.o. female is s/p right above knee amputation  2 Days Post-Op  - Dressing changed today; no drainage - PT/OT recommending CIR - [employer] stump sock ordered - D/c to CIR when bed available   Emilie Rutter, PA-C [facility] [phone] 03/31/2018 8:21 AM   I have examined the patient, reviewed and agree with above.  [name], MD 03/31/2018 8:58 AM

## 2018-03-31 NOTE — Progress Notes (Signed)
Orthopedic Tech Progress Note Patient Details:  [name] 1945-12-05 [id number] Called in brace order Patient ID: Emma [name], female   DOB: [birth year], 73 y.o.   MRN: [id number]   Donald Pore 03/31/2018, 8:59 AM

## 2018-04-01 LAB — GLUCOSE, CAPILLARY
Glucose-Capillary: 218 mg/dL — ABNORMAL HIGH (ref 70–99)
Glucose-Capillary: 181 mg/dL — ABNORMAL HIGH (ref 70–99)
Glucose-Capillary: 247 mg/dL — ABNORMAL HIGH (ref 70–99)
Glucose-Capillary: 180 mg/dL — ABNORMAL HIGH (ref 70–99)

## 2018-04-01 NOTE — Progress Notes (Signed)
Occupational Therapy Treatment Patient Details Name: Emma [name] MRN: [id number] DOB: June 17, 1945 Today's Date: 04/01/2018    History of present illness Pt is a 73 y.o. female admitted 03/23/18 with critical R LE ischemia. Underwent R femoral BPG 3/9 and distal femoral BPG 3/10. Pt with new onset of R foot pulselessness 3/11 and taken back to OR. s.p Rt AKA. PMH includes current smoker, back sx (2016), PVD, DM, HTN.   OT comments  Pt back to baseline in cognition, eager to return to work. Pt able to pivot to stand x 2 and pivot to chair with RW and 2 person assist. Educated in compensatory strategies for LB bathing and dressing and meal prep. Pt instructed in pressure relief. She has excellent potential to progress to a modified independent level with intensive rehab. Will continue to follow.  Follow Up Recommendations  CIR    Equipment Recommendations  Tub/shower bench;3 in 1 bedside commode;Wheelchair (measurements OT);Wheelchair cushion (measurements OT)    Recommendations for Other Services      Precautions / Restrictions Precautions Precautions: Fall Precaution Comments: RLE new AKA Restrictions Weight Bearing Restrictions: No       Mobility Bed Mobility Overal bed mobility: Needs Assistance Bed Mobility: Rolling;Sidelying to Sit Rolling: Min guard Sidelying to sit: Mod assist;HOB elevated       General bed mobility comments: Cues for technique and to reach for the rail; assist with trunk to get to EOB.  Transfers Overall transfer level: Needs assistance Equipment used: Rolling walker (2 wheeled) Transfers: Sit to/from [employer] Sit to Stand: Mod assist;+2 physical assistance Stand pivot transfers: +2 physical assistance;Mod assist       General transfer comment: Assist to power to standing with cues for hand placement/technique. Stood from [employer] x2. Cues for upright posture and for RLE extension.    Balance Overall balance assessment: Needs  assistance Sitting-balance support: Single extremity supported(foot supported) Sitting balance-Leahy Scale: Fair Sitting balance - Comments: Able to sit and perform some therex without LOB.   Standing balance support: Bilateral upper extremity supported Standing balance-Leahy Scale: Poor Standing balance comment: Min A for standing balance with cues for right hip extension and upright.                            ADL either performed or assessed with clinical judgement   ADL Overall ADL's : Needs assistance/impaired                     Lower Body Dressing: Set up;Bed level Lower Body Dressing Details (indicate cue type and reason): L sock               General ADL Comments: Pt alert and participatory. Began educating pt in compensatory strategies for ADL and meal prep from w/c level.     Vision       Perception     Praxis      Cognition Arousal/Alertness: Awake/alert Behavior During Therapy: WFL for tasks assessed/performed Overall Cognitive Status: Within Functional Limits for tasks assessed                                 General Comments: pt with decreased insight and awareness of deficits        Exercises    Shoulder Instructions       General Comments      Pertinent Vitals/ Pain  Pain Assessment: 0-10 Pain Score: 5  Pain Location: right residual limb Pain Descriptors / Indicators: Sore;Grimacing;Guarding Pain Intervention(s): Monitored during session;Repositioned  Home Living                                          Prior Functioning/Environment              Frequency  Min 2X/week        Progress Toward Goals  OT Goals(current goals can now be found in the care plan section)  Progress towards OT goals: Progressing toward goals  Acute Rehab OT Goals Patient Stated Goal: to get back home and return to work OT Goal Formulation: With patient Time For Goal Achievement:  04/10/18 Potential to Achieve Goals: Good  Plan Discharge plan remains appropriate    Co-evaluation    PT/OT/SLP Co-Evaluation/Treatment: Yes Reason for Co-Treatment: For patient/therapist safety PT goals addressed during session: Mobility/safety with mobility;Balance OT goals addressed during session: ADL's and self-care      AM-PAC OT "6 Clicks" Daily Activity     Outcome Measure   Help from another person eating meals?: None Help from another person taking care of personal grooming?: A Little Help from another person toileting, which includes using toliet, bedpan, or urinal?: A Lot Help from another person bathing (including washing, rinsing, drying)?: A Lot Help from another person to put on and taking off regular upper body clothing?: A Little Help from another person to put on and taking off regular lower body clothing?: A Lot 6 Click Score: 16    End of Session Equipment Utilized During Treatment: Gait belt;Rolling walker  OT Visit Diagnosis: Pain;Muscle weakness (generalized) (M62.81);Unsteadiness on feet (R26.81)   Activity Tolerance Patient tolerated treatment well   Patient Left in chair;with call bell/phone within reach   Nurse Communication          Time: 4709-6283 OT Time Calculation (min): 30 min  Charges: OT General Charges $OT Visit: 1 Visit OT Treatments $Therapeutic Activity: 8-22 mins  Martie Round, OTR/L Acute Rehabilitation Services Pager: [phone] Office: [phone]   Evern Bio 04/01/2018, 10:41 AM

## 2018-04-01 NOTE — Progress Notes (Signed)
Inpatient Diabetes Program Recommendations  AACE/ADA: New Consensus Statement on Inpatient Glycemic Control (2015)  Target Ranges:  Prepandial:   less than 140 mg/dL      Peak postprandial:   less than 180 mg/dL (1-2 hours)      Critically ill patients:  140 - 180 mg/dL   Lab Results  Component Value Date   GLUCAP 218 (H) 04/01/2018    Review of Glycemic Control  Diabetes history: DM2 Outpatient Diabetes medications: metformin 1000 mg bid Current orders for Inpatient glycemic control: Novolog 0-15 units tidwc  Inpatient Diabetes Program Recommendations:     Add Lantus 10 units QHS Add Novolog 3 units tidwc for meal coverage insulin if eating > 50% meal. HgbA1C to assess glycemic control prior to admission  Continue to follow.  Thank you. Ailene Ards, RD, LDN, CDE Inpatient Diabetes Coordinator [phone]

## 2018-04-01 NOTE — Progress Notes (Signed)
    Subjective  - POD #3, s/p right AKA  Left leg numbness improved ,mild phantom pain   Physical Exam:  Dressing dry       Assessment/Plan:  POD #3  Hopefully to CIR soon    Wells Aadith Raudenbush 04/01/2018 7:54 AM --  Vitals:   03/31/18 2027 04/01/18 0600  BP: (!) 124/59 118/64  Pulse: 84 71  Resp: (!) 25 13  Temp: 98.4 F (36.9 C) 98.3 F (36.8 C)  SpO2: 95% 96%    Intake/Output Summary (Last 24 hours) at 04/01/2018 0754 Last data filed at 04/01/2018 0604 Gross per 24 hour  Intake 480 ml  Output 2075 ml  Net -1595 ml     Laboratory CBC    Component Value Date/Time   WBC 8.5 03/31/2018 0304   HGB 8.2 (L) 03/31/2018 0304   HCT 26.2 (L) 03/31/2018 0304   PLT 335 03/31/2018 0304    BMET    Component Value Date/Time   NA 138 03/31/2018 0304   K 3.5 03/31/2018 0304   CL 108 03/31/2018 0304   CO2 23 03/31/2018 0304   GLUCOSE 235 (H) 03/31/2018 0304   BUN 9 03/31/2018 0304   CREATININE 0.81 03/31/2018 0304   CALCIUM 7.6 (L) 03/31/2018 0304   GFRNONAA >60 03/31/2018 0304   GFRAA >60 03/31/2018 0304    COAG Lab Results  Component Value Date   INR 1.0 03/25/2018   No results found for: PTT  Antibiotics Anti-infectives (From admission, onward)   Start     Dose/Rate Route Frequency Ordered Stop   03/29/18 1430  ceFAZolin (ANCEF) IVPB 2g/100 mL premix     2 g 200 mL/hr over 30 Minutes Intravenous Every 8 hours 03/29/18 1413 03/29/18 2248   03/29/18 0815  ceFAZolin (ANCEF) IVPB 2g/100 mL premix  Status:  Discontinued     2 g 200 mL/hr over 30 Minutes Intravenous To Short Stay 03/29/18 0805 03/29/18 1419   03/27/18 0100  ceFAZolin (ANCEF) IVPB 2g/100 mL premix     2 g 200 mL/hr over 30 Minutes Intravenous Every 8 hours 03/26/18 1940 03/27/18 1659   03/26/18 0954  ceFAZolin (ANCEF) 2-4 GM/100ML-% IVPB    Note to Pharmacy:  Aquilla Hacker   : cabinet override      03/26/18 0954 03/26/18 2159       V. Charlena Cross, M.D., [facility] [place] Office: [phone] Pager:  [phone]

## 2018-04-01 NOTE — Care Management Important Message (Signed)
Important Message  Patient Details  Name: Emma [name] MRN: [id number] Date of Birth: 1945/02/19   Medicare Important Message Given:  Yes    Oralia Rud Bryse Blanchette 04/01/2018, 4:09 PM

## 2018-04-01 NOTE — Progress Notes (Addendum)
Initial Nutrition Assessment  DOCUMENTATION CODES:   Not applicable  INTERVENTION:  Continue Ensure Enlive po BID, each supplement provides 350 kcal and 20 grams of protein  Encourage adequate PO intake.   NUTRITION DIAGNOSIS:   Increased nutrient needs related to wound healing as evidenced by estimated needs.  GOAL:   Patient will meet greater than or equal to 90% of their needs  MONITOR:   PO intake, Labs, Supplement acceptance, Weight trends, I & O's, Skin  REASON FOR ASSESSMENT:   Malnutrition Screening Tool    ASSESSMENT:   73 y.o. female PMH includes PVD, DM, HTN. admitted 03/23/18 with critical R LE ischemia underwent R AKA 3/13.   Meal completion has been 50-100%. Pt reports appetite has been fine currently and PTA. Pt reports she usually consumes 1 meal a day (sandwich or fast food) with multiple snacks throughout the day that consists of fruit, cheese, chips, and/or crackers. Pt currently has Ensure ordered. RD to continue with current orders to aid in wound healing. Pt educated on the importance of protein on healing. Discussed food items high in protein content. Pt reports understanding of information discussed.   Labs and medications reviewed.   NUTRITION - FOCUSED PHYSICAL EXAM:    Most Recent Value  Orbital Region  No depletion  Upper Arm Region  No depletion  Thoracic and Lumbar Region  No depletion  Buccal Region  No depletion  Temple Region  No depletion  Clavicle Bone Region  No depletion  Clavicle and Acromion Bone Region  No depletion  Scapular Bone Region  Unable to assess  Dorsal Hand  No depletion  Patellar Region  No depletion  Anterior Thigh Region  No depletion  Posterior Calf Region  No depletion  Edema (RD Assessment)  Mild  Hair  Reviewed  Eyes  Reviewed  Mouth  Reviewed  Skin  Reviewed  Nails  Reviewed       Diet Order:   Diet Order            Diet heart healthy/carb modified Room service appropriate? Yes; Fluid consistency:  Thin  Diet effective now              EDUCATION NEEDS:   Education needs have been addressed  Skin:  Skin Assessment: Skin Integrity Issues: Skin Integrity Issues:: Incisions Incisions: R leg  Last BM:  3/15  Height:   Ht Readings from Last 1 Encounters:  03/29/18 5\' 2"  (1.575 m)    Weight:   Wt Readings from Last 1 Encounters:  03/29/18 70.4 kg    Ideal Body Weight:  (46 adjusted for AKA)  BMI:  Body mass index is 28.39 kg/m.  Estimated Nutritional Needs:   Kcal:  1700-1950  Protein:  85-100 grams  Fluid:  1.7 - 1.9 L/day    Emma Smiling, MS, RD, LDN Pager # [phone] After hours/ weekend pager # [phone]

## 2018-04-01 NOTE — Progress Notes (Signed)
Inpatient Rehabilitation Admissions Coordinator  Inpatient rehab consult received. I met with patient at bedside for assessment. Pt prefers an inpt rehab admit and will not agree to SNF. I will begin insurance approval with [employer] and follow up tomorrow pending insurance decision.  Danne Baxter, RN, MSN Rehab Admissions Coordinator [phone] 04/01/2018 1:30 PM

## 2018-04-01 NOTE — Progress Notes (Signed)
Physical Therapy Treatment Patient Details Name: Emma [name] MRN: [id number] DOB: 1945/05/29 Today's Date: 04/01/2018    History of Present Illness Pt is a 73 y.o. female admitted 03/23/18 with critical R LE ischemia. Underwent R femoral BPG 3/9 and distal femoral BPG 3/10. Pt with new onset of R foot pulselessness 3/11 and taken back to OR. s.p Rt AKA. PMH includes current smoker, back sx (2016), PVD, DM, HTN.    PT Comments    Patient progressing well towards PT goals. Tolerated therex sitting in chair and in standing for right residual limb. Requires Mod A for bed mobility and standing transfers. Able to mini hop to chair with Mod A of 2 with use of RW. Education re: pressure relief and offloading bottom while in the chair and there ex. Pt eager and motivated to return to PLOF. Would be a great CIR candidate. Will follow.     Follow Up Recommendations  CIR     Equipment Recommendations  Wheelchair (measurements PT);Wheelchair cushion (measurements PT)    Recommendations for Other Services       Precautions / Restrictions Precautions Precautions: Fall Restrictions Weight Bearing Restrictions: No    Mobility  Bed Mobility Overal bed mobility: Needs Assistance Bed Mobility: Rolling;Sidelying to Sit Rolling: Min guard Sidelying to sit: Mod assist;HOB elevated       General bed mobility comments: Cues for technique and to reach for the rail; assist with trunk to get to EOB.  Transfers Overall transfer level: Needs assistance Equipment used: Rolling walker (2 wheeled) Transfers: Sit to/from Stand Sit to Stand: Mod assist;+2 physical assistance         General transfer comment: Assist to power to standing with cues for hand placement/technique. Stood from [employer] x2. Cues for upright posture and for RLE extension.  Ambulation/Gait Ambulation/Gait assistance: Mod assist;+2 physical assistance Gait Distance (Feet): 2 Feet Assistive device: Rolling walker (2  wheeled) Gait Pattern/deviations: Step-to pattern;Trunk flexed     General Gait Details: Able to take mini hops to get to chair with assist of 2 for balance/safety.    Stairs             Wheelchair Mobility    Modified Rankin (Stroke Patients Only)       Balance Overall balance assessment: Needs assistance Sitting-balance support: Single extremity supported(foot supported) Sitting balance-Leahy Scale: Fair Sitting balance - Comments: Able to sit and perform some therex without LOB.   Standing balance support: Bilateral upper extremity supported Standing balance-Leahy Scale: Poor Standing balance comment: Min A for standing balance with cues for right hip extension and upright.                             Cognition Arousal/Alertness: Awake/alert Behavior During Therapy: WFL for tasks assessed/performed Overall Cognitive Status: Within Functional Limits for tasks assessed                                 General Comments: for basic mobility tasks      Exercises Amputee Exercises Quad Sets: AROM;Right;5 reps Gluteal Sets: AROM;Right;5 reps Hip Extension: AROM;Right;5 reps;Standing Hip ABduction/ADduction: AROM;Right;5 reps;Seated Hip Flexion/Marching: AROM;5 reps;Right    General Comments        Pertinent Vitals/Pain Pain Assessment: 0-10 Pain Score: 5  Pain Location: right residual limb Pain Descriptors / Indicators: Sore;Grimacing;Guarding Pain Intervention(s): Monitored during session;Repositioned    Home Living  Prior Function            PT Goals (current goals can now be found in the care plan section) Progress towards PT goals: Progressing toward goals    Frequency    Min 3X/week      PT Plan Current plan remains appropriate    Co-evaluation PT/OT/SLP Co-Evaluation/Treatment: Yes Reason for Co-Treatment: For patient/therapist safety;To address functional/ADL transfers PT goals  addressed during session: Mobility/safety with mobility;Balance        AM-PAC PT "6 Clicks" Mobility   Outcome Measure  Help needed turning from your back to your side while in a flat bed without using bedrails?: A Little Help needed moving from lying on your back to sitting on the side of a flat bed without using bedrails?: A Lot Help needed moving to and from a bed to a chair (including a wheelchair)?: A Lot Help needed standing up from a chair using your arms (e.g., wheelchair or bedside chair)?: A Lot Help needed to walk in hospital room?: A Lot Help needed climbing 3-5 steps with a railing? : Total 6 Click Score: 12    End of Session Equipment Utilized During Treatment: Gait belt Activity Tolerance: Patient tolerated treatment well Patient left: in chair;with call bell/phone within reach Nurse Communication: Mobility status PT Visit Diagnosis: Other abnormalities of gait and mobility (R26.89);Pain Pain - Right/Left: Right Pain - part of body: Leg     Time: 6825-7493 PT Time Calculation (min) (ACUTE ONLY): 28 min  Charges:  $Therapeutic Activity: 8-22 mins                     Mylo Red, PT, DPT Acute Rehabilitation Services Pager [phone] Office [phone]       Blake Divine A Lanier Ensign 04/01/2018, 10:28 AM

## 2018-04-02 ENCOUNTER — Telehealth: Payer: Self-pay | Admitting: Vascular Surgery

## 2018-04-02 LAB — GLUCOSE, CAPILLARY
GLUCOSE-CAPILLARY: 175 mg/dL — AB (ref 70–99)
Glucose-Capillary: 205 mg/dL — ABNORMAL HIGH (ref 70–99)
Glucose-Capillary: 128 mg/dL — ABNORMAL HIGH (ref 70–99)
Glucose-Capillary: 300 mg/dL — ABNORMAL HIGH (ref 70–99)

## 2018-04-02 MED ORDER — ASPIRIN 81 MG PO TBEC: 81.0000 mg | DELAYED_RELEASE_TABLET | Freq: Every day | ORAL | Status: DC

## 2018-04-02 NOTE — Discharge Instructions (Signed)
Incision Care, Adult °An incision is a cut that a doctor makes in your skin for surgery (for a procedure). Most times, these cuts are closed after surgery. Your cut from surgery may be closed with stitches (sutures), staples, skin glue, or skin tape (adhesive strips). You may need to return to your doctor to have stitches or staples taken out. This may happen many days or many weeks after your surgery. The cut needs to be well cared for so it does not get infected. °How to care for your cut °Cut care ° °· Follow instructions from your doctor about how to take care of your cut. Make sure you: °? Wash your hands with soap and water before you change your bandage (dressing). If you cannot use soap and water, use hand sanitizer. °? Change your bandage as told by your doctor. °? Leave stitches, skin glue, or skin tape in place. They may need to stay in place for 2 weeks or longer. If tape strips get loose and curl up, you may trim the loose edges. Do not remove tape strips completely unless your doctor says it is okay. °· Check your cut area every day for signs of infection. Check for: °? More redness, swelling, or pain. °? More fluid or blood. °? Warmth. °? Pus or a bad smell. °· Ask your doctor how to clean the cut. This may include: °? Using mild soap and water. °? Using a clean towel to pat the cut dry after you clean it. °? Putting a cream or ointment on the cut. Do this only as told by your doctor. °? Covering the cut with a clean bandage. °· Ask your doctor when you can leave the cut uncovered. °· Do not take baths, swim, or use a hot tub until your doctor says it is okay. Ask your doctor if you can take showers. You may only be allowed to take sponge baths for bathing. °Medicines °· If you were prescribed an antibiotic medicine, cream, or ointment, take the antibiotic or put it on the cut as told by your doctor. Do not stop taking or putting on the antibiotic even if your condition gets better. °· Take  over-the-counter and prescription medicines only as told by your doctor. °General instructions °· Limit movement around your cut. This helps healing. °? Avoid straining, lifting, or exercise for the first month, or for as long as told by your doctor. °? Follow instructions from your doctor about going back to your normal activities. °? Ask your doctor what activities are safe. °· Protect your cut from the sun when you are outside for the first 6 months, or for as long as told by your doctor. Put on sunscreen around the scar or cover up the scar. °· Keep all follow-up visits as told by your doctor. This is important. °Contact a doctor if: °· Your have more redness, swelling, or pain around the cut. °· You have more fluid or blood coming from the cut. °· Your cut feels warm to the touch. °· You have pus or a bad smell coming from the cut. °· You have a fever or shaking chills. °· You feel sick to your stomach (nauseous) or you throw up (vomit). °· You are dizzy. °· Your stitches or staples come undone. °Get help right away if: °· You have a red streak coming from your cut. °· Your cut bleeds through the bandage and the bleeding does not stop with gentle pressure. °· The edges of your cut   open up and separate. °· You have very bad (severe) pain. °· You have a rash. °· You are confused. °· You pass out (faint). °· You have trouble breathing and you have a fast heartbeat. °This information is not intended to replace advice given to you by your health care provider. Make sure you discuss any questions you have with your health care provider. °Document Released: 03/27/2011 Document Revised: 09/10/2015 Document Reviewed: 09/10/2015 °Elsevier Interactive Patient Education © 2019 [employer] ° °

## 2018-04-02 NOTE — Progress Notes (Signed)
Vascular PA notified that pt's insurance did not approve CIR and SW will be working on SNF placement tomorrow.

## 2018-04-02 NOTE — Progress Notes (Signed)
Inpatient Rehabilitation Admissions Coordinator  I have received a denial from [facility] for an inpt rehab admission. I met with patient at bedside and she is aware. She is in agreement to SNF at this time. I will alert, Abran Duke and Hassan Rowan, RN CM. We will sign off at this time. We will sign off at this time.  Danne Baxter, RN, MSN Rehab Admissions Coordinator [phone] 04/02/2018 4:27 PM

## 2018-04-02 NOTE — Progress Notes (Addendum)
Vascular PA notified that pt required in and out cath again last night.  Since her procedure on 3/13, pt had to have two foleys placed for urine retention, as per report from previous shift.  Last foley was discontinued yesterday morning, after which pt reportedly had one successful unmeasured void in [facility], and then unable to void with urge to go last night, which is when she was in and outed.  Per Vascular PA, if pt unable to void today, indwelling catheter to be inserted prior to discharge.  Pt does not currently have urge to void, will continue to monitor and bladder scan as needed.

## 2018-04-02 NOTE — Discharge Summary (Addendum)
Physician Discharge Summary   Patient ID: Emma [name] [id number] 73 y.o. 1945/10/29  Admit date: 03/23/2018  Discharge date and time:  04/04/2018  Admitting Physician: Cephus Shelling, MD   Discharge Physician: Dr. Randie Heinz  Admission Diagnoses: Ischemia of right lower extremity [I99.8]  Discharge Diagnoses: s/p R AKA  Admission Condition: fair  Discharged Condition: fair  Indication for Admission: Ischemia of right lower extremity  Hospital Course: Emma [name] is a 73 year old female who presented to the emergency department with progression of claudication to rest pain right lower extremity.  She was mated to the hospital and started on IV heparin on 03/23/2018.  She underwent aortogram with lower extremity runoff by Dr. Randie Heinz on 03/25/2018 which demonstrated extensive SFA occlusion popliteal occlusion with unreliable tibial vessel runoff.  On 03/26/2018 she underwent right femoral to posterior tibial artery bypass with vein as well as iliofemoral thrombectomy.  She was continued on IV heparin postoperatively.  This was found to be acutely thrombosed overnight and she returned to the operating room on 03/27/2022 femoral to peroneal artery bypass with endarterectomy and balloon angioplasty of peroneal artery.  Despite multiple revascularization attempts there still was not any dopplerable signal at the ankle when she returned to her stepdown unit bed.  Over the next several days Dr. Randie Heinz discussed with the patient that she did not have any further revascularization or reconstruction attempts and she will need an above-the-knee amputation.  She eventually agreed to this and gave her consent and on 03/29/2018 Dr. Myra Gianotti performed an above-the-knee amputation.  The rest of her hospital stay consisted of pain control as well as PT/OT evaluation and recommendation for inpatient rehabilitation.  She eventually was approved and authorized for inpatient rehab stay.  At the time of discharge  AKA incision remains unremarkable and pain is better controlled.  We will continue to follow intermittently while she is in inpatient rehabilitation and likely remove prior vein harvest incision staples and proximal medial thigh during her stay.  She will then follow-up in about 4 weeks and we will remove AKA incision staples at that time.  Discharge instructions were reviewed with the patient and she voices her understanding.  She will be discharged this afternoon to SNF in stable condition.  Consults: None  Treatments: surgery:  1. Aortogram with bilateral lower extremity runoff by Dr. Randie Heinz on 03/25/2018 2.  Right iliofemoral thromboembolectomy with right femoral to PT bypass with saphenous vein with patch angioplasty right PTA by Dr. Randie Heinz on 03/26/2018 3.  Right femoral to peroneal artery bypass with vein with endarterectomy of peroneal artery and balloon angioplasty of peroneal artery by Dr. Randie Heinz on 03/27/2018 4.  Right above-the-knee amputation by Dr. Myra Gianotti on 03/29/2018  Discharge Exam: See progress note 04/02/2018 Vitals:   04/01/18 1942 04/02/18 0419  BP: (!) 113/59 127/60  Pulse: 69 67  Resp: 19 19  Temp: 98.4 F (36.9 C) 98.3 F (36.8 C)  SpO2: 95% 94%     Disposition: Discharge disposition: 90-DC/txfr to inpt rehab facility with planned acute care hosp IP admission       Patient Instructions:  Allergies as of 04/02/2018   No Known Allergies     Medication List    TAKE these medications   acetaminophen 650 MG CR tablet Commonly known as:  TYLENOL Take 1,300 mg by mouth daily as needed (back pain).   aspirin 81 MG EC tablet Take 1 tablet (81 mg total) by mouth daily.   atorvastatin 10 MG tablet Commonly known as:  LIPITOR Take 10 mg by mouth daily.   Foltanx 3-35-2 MG Tabs Take 1 tablet by mouth See admin instructions. Take one tablet by mouth every 2-3 days   gabapentin 600 MG tablet Commonly known as:  NEURONTIN Take 1,200 mg by mouth 2 (two) times daily  with breakfast and lunch.   HYDROcodone-acetaminophen 5-325 MG tablet Commonly known as:  NORCO/VICODIN Take 1 tablet by mouth every 8 (eight) hours as needed (pain).   levothyroxine 100 MCG tablet Commonly known as:  SYNTHROID, LEVOTHROID Take 100 mcg by mouth daily.   lisinopril 10 MG tablet Commonly known as:  PRINIVIL,ZESTRIL Take 10 mg by mouth daily.   metFORMIN 1000 MG tablet Commonly known as:  GLUCOPHAGE Take 1,000 mg by mouth 2 (two) times daily with breakfast and lunch.   multivitamin with minerals Tabs tablet Take 2 tablets by mouth daily.   VISINE OP Place 1 drop into both eyes daily as needed (dry eyes).      Activity: activity as tolerated Diet: regular diet Wound Care: keep wound clean and dry  Follow-up with Dr. Randie Heinz in 4 weeks.  SignedEmilie Rutter 04/02/2018 9:33 AM

## 2018-04-02 NOTE — Progress Notes (Addendum)
[facility]  Subjective  - Doing well over all.   Objective 127/60 67 98.3 F (36.8 C) (Oral) 19 94%  Intake/Output Summary (Last 24 hours) at 04/02/2018 0723 Last data filed at 04/02/2018 0048 Gross per 24 hour  Intake 600 ml  Output 475 ml  Net 125 ml    Right AKA stump sock in place, skin warm to touch and minimal active hip motor intact.   Assessment/Planning: POD # 4 Right AKA  Improved discomfort Pending CIR acceptance  Mosetta Pigeon 04/02/2018 7:23 AM --  Laboratory Lab Results: Recent Labs    03/31/18 0304  WBC 8.5  HGB 8.2*  HCT 26.2*  PLT 335   BMET Recent Labs    03/31/18 0304  NA 138  K 3.5  CL 108  CO2 23  GLUCOSE 235*  BUN 9  CREATININE 0.81  CALCIUM 7.6*    COAG Lab Results  Component Value Date   INR 1.0 03/25/2018   No results found for: PTT   I have independently interviewed and examined patient and agree with PA assessment and plan above.   Selah Klang C. Randie Heinz, MD [facility] Office: [phone] Pager: [phone]

## 2018-04-02 NOTE — Telephone Encounter (Signed)
sch appt spk to pt mld ltr 05/10/2018 3pm staple removal MD

## 2018-04-02 NOTE — Progress Notes (Signed)
Foley catheter placed due to urinary retention.  Pt unable to void after sitting up on bedside commode.  Bladder scanner revealed 473cc.  After initial placement, 700cc urine emptied.

## 2018-04-02 NOTE — Progress Notes (Signed)
Patient foley catheter removed 3/16. Patient spontaneously voided during day shift. Patient asked to sit on BSC this evening but did not produce urine even though she said she felt she "had to go." Patient bladder scanned with result of . Per post catheter removal protocol, RN performed in and out catheterization. Patient educated before procedure. Volume obtained = . Post void residual bladder scan = 0 (no urine detected on scanner). Peri care performed and patient back in bed with call bell within reach. Will continue to monitor. Victorino December, RN

## 2018-04-02 NOTE — Telephone Encounter (Signed)
-----   Message from Emilie Rutter, PA-C sent at 04/02/2018  9:31 AM EDT -----  Can you schedule an appt for this pt in about 4 weeks with Dr. Randie Heinz for staple removal.  PO R AKA. Thanks, [employer]

## 2018-04-03 LAB — GLUCOSE, CAPILLARY
Glucose-Capillary: 253 mg/dL — ABNORMAL HIGH (ref 70–99)
Glucose-Capillary: 130 mg/dL — ABNORMAL HIGH (ref 70–99)
Glucose-Capillary: 318 mg/dL — ABNORMAL HIGH (ref 70–99)
Glucose-Capillary: 96 mg/dL (ref 70–99)

## 2018-04-03 NOTE — Progress Notes (Signed)
Focus of session on educating pt in UE exercises using level 3 theraband and through chair push ups. Instructed in desensitization of residual limb and importance of pressure relief in sitting. Pt demonstrating understanding of all. Updated d/c to SNF.    04/03/18 1600  OT Visit Information  Last OT Received On 04/03/18  Assistance Needed +2  History of Present Illness Pt is a 73 y.o. female admitted 03/23/18 with critical R LE ischemia. Underwent R femoral BPG 3/9 and distal femoral BPG 3/10. Pt with new onset of R foot pulselessness 3/11 and taken back to OR. s.p Rt AKA. PMH includes current smoker, back sx (2016), PVD, DM, HTN.  Precautions  Precautions Fall  Precaution Comments RLE new AKA  Pain Assessment  Pain Assessment Faces  Faces Pain Scale 4  Pain Location right residual limb  Pain Descriptors / Indicators Sore  Pain Intervention(s) Monitored during session  Cognition  Arousal/Alertness Awake/alert  Behavior During Therapy WFL for tasks assessed/performed  Overall Cognitive Status Within Functional Limits for tasks assessed  Bed Mobility  General bed mobility comments pt received in chair  Balance  Overall balance assessment Needs assistance  Sitting balance-Leahy Scale Fair  Exercises  Exercises General Upper Extremity  General Exercises - Upper Extremity  Shoulder Flexion Strengthening;Both;20 reps;Seated;Theraband  Shoulder Horizontal ABduction Strengthening;Both;20 reps;Seated;Theraband  Elbow Extension Strengthening;Both;20 reps;Seated;Theraband  Chair Push Up Strengthening;Both;5 reps;Seated  Theraband Level (Shoulder Flexion) Level 3 (Green)  Theraband Level (Shoulder Horizontal Abduction) Level 3 (Green)  Theraband Level (Elbow Extension) Level 3 (Green)  Other Exercises  Other Exercises educated on sensory stimulation techniques for residual limb  OT - End of Session  Activity Tolerance Patient tolerated treatment well  Patient left in chair;with call  bell/phone within reach  OT Assessment/Plan  OT Plan Discharge plan needs to be updated  OT Visit Diagnosis Pain;Muscle weakness (generalized) (M62.81);Unsteadiness on feet (R26.81)  Pain - Right/Left Right  Pain - part of body Leg  OT Frequency (ACUTE ONLY) Min 2X/week  Follow Up Recommendations SNF;Supervision/Assistance - 24 hour  OT Equipment Tub/shower bench;3 in 1 bedside commode;Wheelchair (measurements OT);Wheelchair cushion (measurements OT)  AM-PAC OT "6 Clicks" Daily Activity Outcome Measure (Version 2)  Help from another person eating meals? 4  Help from another person taking care of personal grooming? 3  Help from another person toileting, which includes using toliet, bedpan, or urinal? 2  Help from another person bathing (including washing, rinsing, drying)? 2  Help from another person to put on and taking off regular upper body clothing? 3  Help from another person to put on and taking off regular lower body clothing? 2  6 Click Score 16  OT Goal Progression  Progress towards OT goals Progressing toward goals  Acute Rehab OT Goals  Patient Stated Goal to get back home and return to work  OT Goal Formulation With patient  Time For Goal Achievement 04/10/18  Potential to Achieve Goals Good  OT Time Calculation  OT Start Time (ACUTE ONLY) 1545  OT Stop Time (ACUTE ONLY) 1606  OT Time Calculation (min) 21 min  OT General Charges  $OT Visit 1 Visit  OT Treatments  $Therapeutic Exercise 8-22 mins  Martie Round, OTR/L Acute Rehabilitation Services Pager: [phone] Office: [phone]

## 2018-04-03 NOTE — NC FL2 (Signed)
[place] MEDICAID FL2 LEVEL OF CARE SCREENING TOOL     IDENTIFICATION  Patient Name: Emma [name] Birthdate: 1945-07-01 Sex: female Admission Date (Current Location): 03/23/2018  [facility] and [place] Number:  Producer, television/film/video and Address:  [facility], [address] [place], [place] [address]      Provider Number: [id number]  Attending Physician Name and Address:  Cephus Shelling, MD  Relative Name and Phone Number:  Corky Mull (friend)     [phone]     Current Level of Care: SNF Recommended Level of Care: Skilled Nursing Facility Prior Approval Number:    Date Approved/Denied:   PASRR Number: [id number]  Discharge Plan: SNF    Current Diagnoses: Patient Active Problem List   Diagnosis Date Noted  . PAD (peripheral artery disease) (HCC) 03/23/2018    Orientation RESPIRATION BLADDER Height & Weight     Self, Time, Situation, Place  Normal Continent(urethral catheter) Weight: 155 lb 3.3 oz (70.4 kg) Height:  5\' 2"  (157.5 cm)  BEHAVIORAL SYMPTOMS/MOOD NEUROLOGICAL BOWEL NUTRITION STATUS      Continent Diet(please see discharge summary)  AMBULATORY STATUS COMMUNICATION OF NEEDS Skin     Verbally Surgical wounds                       Personal Care Assistance Level of Assistance  Bathing, Feeding, Dressing Bathing Assistance: Limited assistance Feeding assistance: Independent Dressing Assistance: Limited assistance     Functional Limitations Info  Sight, Hearing, Speech Sight Info: Adequate Hearing Info: Adequate Speech Info: Adequate    SPECIAL CARE FACTORS FREQUENCY  PT (By licensed PT), OT (By licensed OT)     PT Frequency: 5x per wee OT Frequency: 5x per week            Contractures Contractures Info: Not present    Additional Factors Info  Code Status, Allergies, Insulin Sliding Scale Code Status Info: FULL Allergies Info: NKA   Insulin Sliding Scale Info: insulin aspart (novoLOG)  injection 0-15 Units  3x daily w/meals       Current Medications (04/03/2018):  This is the current hospital active medication list Current Facility-Administered Medications  Medication Dose Route Frequency Provider Last Rate Last Dose  . 0.9 %  sodium chloride infusion (Manually program via Guardrails IV Fluids)   Intravenous Once Emilie Rutter, PA-C      . 0.9 %  sodium chloride infusion (Manually program via Guardrails IV Fluids)   Intravenous Once Maeola Harman, MD      . acetaminophen (TYLENOL) tablet 325-650 mg  325-650 mg Oral Q4H PRN Nada Libman, MD       Or  . acetaminophen (TYLENOL) suppository 325-650 mg  325-650 mg Rectal Q4H PRN Nada Libman, MD      . alum & mag hydroxide-simeth (MAALOX/MYLANTA) 200-200-20 MG/5ML suspension 15-30 mL  15-30 mL Oral Q2H PRN Nada Libman, MD      . aspirin EC tablet 81 mg  81 mg Oral Daily Emilie Rutter, PA-C   81 mg at 04/03/18 0857  . atorvastatin (LIPITOR) tablet 10 mg  10 mg Oral Daily Emilie Rutter, PA-C   10 mg at 04/03/18 0857  . bisacodyl (DULCOLAX) EC tablet 5 mg  5 mg Oral Daily PRN Emilie Rutter, PA-C      . docusate sodium (COLACE) capsule 100 mg  100 mg Oral Daily Nada Libman, MD   100 mg at 04/02/18 5056  . enoxaparin (LOVENOX)  injection 40 mg  40 mg Subcutaneous Q24H Nada Libman, MD   40 mg at 04/03/18 0858  . feeding supplement (ENSURE ENLIVE) (ENSURE ENLIVE) liquid 237 mL  237 mL Oral BID BM Cephus Shelling, MD      . gabapentin (NEURONTIN) tablet 1,200 mg  1,200 mg Oral BID WC Cephus Shelling, MD   1,200 mg at 04/03/18 0857  . guaiFENesin-dextromethorphan (ROBITUSSIN DM) 100-10 MG/5ML syrup 15 mL  15 mL Oral Q4H PRN Nada Libman, MD      . hydrALAZINE (APRESOLINE) injection 5 mg  5 mg Intravenous Q20 Min PRN Nada Libman, MD      . insulin aspart (novoLOG) injection 0-15 Units  0-15 Units Subcutaneous TID WC Emilie Rutter, PA-C   8 Units at 04/03/18 [phone]  .  labetalol (NORMODYNE,TRANDATE) injection 10 mg  10 mg Intravenous Q10 min PRN Nada Libman, MD      . lactated ringers infusion   Intravenous Continuous Emilie Rutter, PA-C 10 mL/hr at 03/26/18 1031    . lactated ringers infusion   Intravenous Continuous Emilie Rutter, PA-C 0 mL/hr at 03/27/18 1342    . levothyroxine (SYNTHROID, LEVOTHROID) tablet 100 mcg  100 mcg Oral Daily Emilie Rutter, PA-C   100 mcg at 04/03/18 0500  . magnesium sulfate IVPB 2 g 50 mL  2 g Intravenous Daily PRN Nada Libman, MD      . metoprolol tartrate (LOPRESSOR) injection 2-5 mg  2-5 mg Intravenous Q2H PRN Nada Libman, MD      . morphine 2 MG/ML injection 2-4 mg  2-4 mg Intravenous Q1H PRN Emilie Rutter, PA-C      . multivitamin with minerals tablet 2 tablet  2 tablet Oral Daily Emilie Rutter, PA-C   2 tablet at 04/03/18 0857  . ondansetron (ZOFRAN) injection 4 mg  4 mg Intravenous Q6H PRN Nada Libman, MD      . oxyCODONE (Oxy IR/ROXICODONE) immediate release tablet 5-10 mg  5-10 mg Oral Q4H PRN Nada Libman, MD   10 mg at 04/03/18 1044  . pantoprazole (PROTONIX) EC tablet 40 mg  40 mg Oral Daily Nada Libman, MD   40 mg at 04/03/18 0858  . phenol (CHLORASEPTIC) mouth spray 1 spray  1 spray Mouth/Throat PRN Nada Libman, MD      . potassium chloride SA (K-DUR,KLOR-CON) CR tablet 20-40 mEq  20-40 mEq Oral Daily PRN Nada Libman, MD      . senna-docusate (Senokot-S) tablet 1 tablet  1 tablet Oral QHS PRN Emilie Rutter, PA-C      . sodium phosphate (FLEET) 7-19 GM/118ML enema 1 enema  1 enema Rectal Once PRN Emilie Rutter, PA-C      . tetrahydrozoline 0.05 % ophthalmic solution 1 drop  1 drop Both Eyes Daily PRN Emilie Rutter, PA-C         Discharge Medications: Please see discharge summary for a list of discharge medications.  Relevant Imaging Results:  Relevant Lab Results:   Additional Information SSN [id number]  Eduard Roux, [place]

## 2018-04-03 NOTE — Progress Notes (Signed)
Physical Therapy Treatment Patient Details Name: Emma [name] MRN: [id number] DOB: 21-Dec-1945 Today's Date: 04/03/2018    History of Present Illness Pt is a 73 y.o. female admitted 03/23/18 with critical R LE ischemia. Underwent R femoral BPG 3/9 and distal femoral BPG 3/10. Pt with new onset of R foot pulselessness 3/11 and taken back to OR. s.p Rt AKA. PMH includes current smoker, back sx (2016), PVD, DM, HTN.    PT Comments    Pt received in bed, willing to participate in therapy. She reports minimal pain until she moves around; at end of session pain 7/10 in residual limb; pt calling RN for pain medications. Mobility overall still very limited by pain and fatigue; transferred pt to the chair from bed (see details below). Monitored pt's BP and HR throughout session. BP WFL throughout. HR increased to 124bpm during transfer. Pt left in chair with HR and BP WFL, call bell in reach. Continue to recommend post-acute rehab to improve pt's functional mobility and safety. Pt will continue to benefit from skilled acute PT services in order to safely progress functional mobility and allow for safe DC.   Follow Up Recommendations  CIR     Equipment Recommendations  Wheelchair (measurements PT);Wheelchair cushion (measurements PT)    Recommendations for Other Services       Precautions / Restrictions Precautions Precautions: Fall Precaution Comments: RLE new AKA Restrictions Weight Bearing Restrictions: Yes RLE Weight Bearing: Non weight bearing    Mobility  Bed Mobility Overal bed mobility: Needs Assistance Bed Mobility: Supine to Sit     Supine to sit: Min assist     General bed mobility comments: Pt able to move LEs to edge of bed, MinA to recover balance as she moved towards EOB and trunk began to fall backwards  Transfers Overall transfer level: Needs assistance Equipment used: Rolling walker (2 wheeled) Transfers: Sit to/from Stand Sit to Stand: Mod assist;+2  physical assistance         General transfer comment: ModA+2 for boost to standing, verbal cues for hand placement, cues for positioning L foot and balancing over L foot once upright  Ambulation/Gait Ambulation/Gait assistance: Mod assist;+2 physical assistance Gait Distance (Feet): 2 Feet Assistive device: Rolling walker (2 wheeled) Gait Pattern/deviations: Step-to pattern;Trunk flexed     General Gait Details: ModA+2 and verbal cues for technique and sequencing for hop steps to chair. Pt sat before in safe position on chair due to fatigue, assist to ensure she sat on edge of chair.   Stairs             Wheelchair Mobility    Modified Rankin (Stroke Patients Only)       Balance Overall balance assessment: Needs assistance Sitting-balance support: Single extremity supported;Bilateral upper extremity supported(foot supported) Sitting balance-Leahy Scale: Fair Sitting balance - Comments: Supports self at edge of bed, no physical assistance needed   Standing balance support: Bilateral upper extremity supported;During functional activity Standing balance-Leahy Scale: Poor Standing balance comment: ModA for standing balance today, cues for positioning L foot under body to find balance                            Cognition Arousal/Alertness: Awake/alert Behavior During Therapy: WFL for tasks assessed/performed Overall Cognitive Status: Within Functional Limits for tasks assessed  Exercises Amputee Exercises Hip ABduction/ADduction: AROM;10 reps;Right;Supine Hip Flexion/Marching: AROM;10 reps;Right    General Comments General comments (skin integrity, edema, etc.): incision bleeding; RN notified by pt while PT in the room      Pertinent Vitals/Pain Pain Assessment: 0-10 Pain Score: 7  Pain Location: right residual limb Pain Descriptors / Indicators: Sore;Grimacing;Guarding Pain Intervention(s):  Limited activity within patient's tolerance;Monitored during session;Patient requesting pain meds-RN notified    Home Living                      Prior Function            PT Goals (current goals can now be found in the care plan section) Acute Rehab PT Goals Patient Stated Goal: to get back home and return to work PT Goal Formulation: With patient Time For Goal Achievement: 04/13/18 Potential to Achieve Goals: Good Progress towards PT goals: Progressing toward goals    Frequency    Min 3X/week      PT Plan Current plan remains appropriate    Co-evaluation              AM-PAC PT "6 Clicks" Mobility   Outcome Measure  Help needed turning from your back to your side while in a flat bed without using bedrails?: A Little Help needed moving from lying on your back to sitting on the side of a flat bed without using bedrails?: A Little Help needed moving to and from a bed to a chair (including a wheelchair)?: A Lot Help needed standing up from a chair using your arms (e.g., wheelchair or bedside chair)?: A Lot Help needed to walk in hospital room?: A Lot Help needed climbing 3-5 steps with a railing? : Total 6 Click Score: 13    End of Session Equipment Utilized During Treatment: Gait belt Activity Tolerance: Patient limited by fatigue;Patient limited by pain Patient left: in chair;with call bell/phone within reach   PT Visit Diagnosis: Other abnormalities of gait and mobility (R26.89);Pain Pain - Right/Left: Right     Time:  -     Charges:                        Halina Andreas, SPT    Halina Andreas 04/03/2018, 3:03 PM

## 2018-04-03 NOTE — Progress Notes (Addendum)
[facility]  Subjective  - Doing better   Objective 125/64 78 98.6 F (37 C) (Oral) 13 97%  Intake/Output Summary (Last 24 hours) at 04/03/2018 0718 Last data filed at 04/03/2018 0340 Gross per 24 hour  Intake 360 ml  Output 1450 ml  Net -1090 ml    Right AKA incision healing well, medial incision healing well. Sock placed, ace wrap dressing removed, minimal incision maceration.   Heart RRR Lungs non labored breathing  Assessment/Planning: POD # 5 Right AKA  Pending SNF   Mosetta Pigeon 04/03/2018 7:18 AM --  Laboratory Lab Results: No results for input(s): WBC, HGB, HCT, PLT in the last 72 hours. BMET No results for input(s): NA, K, CL, CO2, GLUCOSE, BUN, CREATININE, CALCIUM in the last 72 hours.  COAG Lab Results  Component Value Date   INR 1.0 03/25/2018   No results found for: PTT  I have independently interviewed and examined patient and agree with PA assessment and plan above.   Brandon C. Randie Heinz, MD [facility] Office: [phone] Pager: [phone]

## 2018-04-03 NOTE — TOC Progression Note (Signed)
Transition of Care [facility]) - Progression Note    Patient Details  Name: Emma [name] MRN: [id number] Date of Birth: 03/09/1945  Transition of Care [facility]) CM/SW Contact  Eduard Roux, [place] Phone Number: 04/03/2018, 4:33 PM  Clinical Narrative:    CSW called and confirm bed offer with [facility] SN[facility]. CSW requested to have authorization started today but CSW was informed by their SW the admissions coordinator is out of the office and authorization will be started tomorrow. CSW will follow up on authorization request tomorrow.   Expected Discharge Plan: Skilled Nursing Facility Barriers to Discharge: SNF Pending bed offer, Continued Medical Work up, English as a second language teacher  Expected Discharge Plan and Services Expected Discharge Plan: Skilled Nursing Facility     Living arrangements for the past 2 months: Apartment Expected Discharge Date: 04/02/18                         Social Determinants of Health (SDOH) Interventions    Readmission Risk Interventions 30 Day Unplanned Readmission Risk Score     ED to Hosp-Admission (Current) from 03/23/2018 in [facility] 4E CV SURGICAL PROGRESSIVE CARE  30 Day Unplanned Readmission Risk Score (%)  13 Filed at 04/03/2018 1600     This score is the patient's risk of an unplanned readmission within 30 days of being discharged (0 -100%). The score is based on dignosis, age, lab data, medications, orders, and past utilization.   Low:  0-14.9   Medium: 15-21.9   High: 22-29.9   Extreme: 30 and above       No flowsheet data found.

## 2018-04-03 NOTE — TOC Initial Note (Signed)
Transition of Care [facility]) - Initial/Assessment Note    Patient Details  Name: Emma [name] MRN: [id number] Date of Birth: 1945-10-27  Transition of Care [facility]) CM/SW Contact:    Eduard Roux, LCSWA Phone Number: 04/03/2018, 11:43 AM  Clinical Narrative:   CSW visit with the patient at bedside. She was alert and oriented. Patient lives alone in a single level apartment. CSW spoke with patient concerning possibility of rehab at [facility] before returning home.Patient states she does not want to go to SNF but  Knows it is best for her so she can work on her mobility.  Patient recognizes need for rehab before returning home and is agreeable to a SNF placement. CSW provided the patient with Medicare.gov SNF listings and placed copy in her chart. Patient states she will review and give CSW her choices later today.   Patient expressed understanding of CSW role and discharge process as well as medical condition. No questions/concerns about plan or treatment at this time.               Expected Discharge Plan: Skilled Nursing Facility Barriers to Discharge: SNF Pending bed offer, Continued Medical Work up, English as a second language teacher   Patient Goals and CMS Choice     Choice offered to / list presented to : Patient  Expected Discharge Plan and Services Expected Discharge Plan: Skilled Nursing Facility     Living arrangements for the past 2 months: Apartment Expected Discharge Date: 04/02/18                        Prior Living Arrangements/Services Living arrangements for the past 2 months: Apartment Lives with:: Self Patient language and need for interpreter reviewed:: No Do you feel safe going back to the place where you live?: No   going to SNF for ST rehab  Need for Family Participation in Patient Care: Yes (Comment) Care giver support system in place?: Yes (comment)   Criminal Activity/Legal Involvement Pertinent to Current Situation/Hospitalization: No - Comment as  needed  Activities of Daily Living Home Assistive Devices/Equipment: None ADL Screening (condition at time of admission) Patient's cognitive ability adequate to safely complete daily activities?: Yes Is the patient deaf or have difficulty hearing?: No Does the patient have difficulty seeing, even when wearing glasses/contacts?: No Does the patient have difficulty concentrating, remembering, or making decisions?: No Patient able to express need for assistance with ADLs?: Yes Does the patient have difficulty dressing or bathing?: No Independently performs ADLs?: Yes (appropriate for developmental age) Does the patient have difficulty walking or climbing stairs?: Yes Weakness of Legs: Right Weakness of Arms/Hands: None  Permission Sought/Granted Permission sought to share information with : Family Supports Permission granted to share information with : Yes, Verbal Permission Granted  Share Information with NAME: Corky Mull   Permission granted to share info w AGENCY: SNFs  Permission granted to share info w Relationship: friend  Permission granted to share info w Contact Information: [phone]  Emotional Assessment Appearance:: Appears younger than stated age Attitude/Demeanor/Rapport: Engaged, Self-Confident Affect (typically observed): Accepting, Appropriate, Pleasant Orientation: : Oriented to Self, Oriented to Place, Oriented to  Time, Oriented to Situation Alcohol / Substance Use: Not Applicable Psych Involvement: No (comment)  Admission diagnosis:  Ischemia of right lower extremity [I99.8] Patient Active Problem List   Diagnosis Date Noted  . PAD (peripheral artery disease) (HCC) 03/23/2018   PCP:  System, Pcp Not In Pharmacy:   [facility] [address] [place], [place] -  [address] [place] [address] Phone: [phone] Fax: [phone]     Social Determinants of Health (SDOH) Interventions    Readmission Risk  Interventions 30 Day Unplanned Readmission Risk Score     ED to Hosp-Admission (Current) from 03/23/2018 in [facility] 4E CV SURGICAL PROGRESSIVE CARE  30 Day Unplanned Readmission Risk Score (%)  13 Filed at 04/03/2018 0801     This score is the patient's risk of an unplanned readmission within 30 days of being discharged (0 -100%). The score is based on dignosis, age, lab data, medications, orders, and past utilization.   Low:  0-14.9   Medium: 15-21.9   High: 22-29.9   Extreme: 30 and above       No flowsheet data found.

## 2018-04-04 DIAGNOSIS — R5381 Other malaise: Secondary | ICD-10-CM | POA: Diagnosis not present

## 2018-04-04 DIAGNOSIS — Z743 Need for continuous supervision: Secondary | ICD-10-CM | POA: Diagnosis not present

## 2018-04-04 DIAGNOSIS — E1159 Type 2 diabetes mellitus with other circulatory complications: Secondary | ICD-10-CM | POA: Diagnosis not present

## 2018-04-04 DIAGNOSIS — K5901 Slow transit constipation: Secondary | ICD-10-CM | POA: Diagnosis not present

## 2018-04-04 DIAGNOSIS — I10 Essential (primary) hypertension: Secondary | ICD-10-CM | POA: Diagnosis not present

## 2018-04-04 DIAGNOSIS — Z89611 Acquired absence of right leg above knee: Secondary | ICD-10-CM | POA: Diagnosis not present

## 2018-04-04 DIAGNOSIS — E038 Other specified hypothyroidism: Secondary | ICD-10-CM | POA: Diagnosis not present

## 2018-04-04 DIAGNOSIS — R52 Pain, unspecified: Secondary | ICD-10-CM | POA: Diagnosis not present

## 2018-04-04 DIAGNOSIS — Z5189 Encounter for other specified aftercare: Secondary | ICD-10-CM | POA: Diagnosis not present

## 2018-04-04 DIAGNOSIS — E119 Type 2 diabetes mellitus without complications: Secondary | ICD-10-CM | POA: Diagnosis not present

## 2018-04-04 DIAGNOSIS — I7389 Other specified peripheral vascular diseases: Secondary | ICD-10-CM | POA: Diagnosis not present

## 2018-04-04 DIAGNOSIS — R279 Unspecified lack of coordination: Secondary | ICD-10-CM | POA: Diagnosis not present

## 2018-04-04 DIAGNOSIS — N138 Other obstructive and reflux uropathy: Secondary | ICD-10-CM | POA: Diagnosis not present

## 2018-04-04 DIAGNOSIS — E1149 Type 2 diabetes mellitus with other diabetic neurological complication: Secondary | ICD-10-CM | POA: Diagnosis not present

## 2018-04-04 DIAGNOSIS — I739 Peripheral vascular disease, unspecified: Secondary | ICD-10-CM | POA: Diagnosis not present

## 2018-04-04 LAB — GLUCOSE, CAPILLARY
Glucose-Capillary: 254 mg/dL — ABNORMAL HIGH (ref 70–99)
Glucose-Capillary: 131 mg/dL — ABNORMAL HIGH (ref 70–99)
Glucose-Capillary: 224 mg/dL — ABNORMAL HIGH (ref 70–99)

## 2018-04-04 MED ORDER — HYDROCODONE-ACETAMINOPHEN 5-325 MG PO TABS: 1.0000 | ORAL_TABLET | Freq: Four times a day (QID) | ORAL | 0 refills | Status: DC | PRN

## 2018-04-04 NOTE — Progress Notes (Addendum)
[facility] [place]  Subjective  - Doing well over all no new complaints.   Objective 112/65 73 98.7 F (37.1 C) (Oral) (!) 22 97%  Intake/Output Summary (Last 24 hours) at 04/04/2018 0736 Last data filed at 04/03/2018 2252 Gross per 24 hour  Intake 540 ml  Output 950 ml  Net -410 ml    Right AKA and medial incision healing well with out erythema or edema.   Heart RRR Lungs non labored breathing  Assessment/Planning: POD # 6 right AKA 03/27/2018 right fem-peroneal by pass Plan discharge to SNF when bed available    Mosetta Pigeon 04/04/2018 7:36 AM --  Laboratory Lab Results: No results for input(s): WBC, HGB, HCT, PLT in the last 72 hours. BMET No results for input(s): NA, K, CL, CO2, GLUCOSE, BUN, CREATININE, CALCIUM in the last 72 hours.  COAG Lab Results  Component Value Date   INR 1.0 03/25/2018   No results found for: PTT   I have independently interviewed and examined patient and agree with PA assessment and plan above. Ok for snf when bed available.   Will need to follow left lower extremity as outpatient with known sfa occlusion and moderate level ABI.  Milagros Middendorf C. Randie Heinz, MD [facility] [place] Office: [phone] Pager: [phone]

## 2018-04-04 NOTE — Care Management Important Message (Signed)
Important Message  Patient Details  Name: Melandie Warth MRN: [id number] Date of Birth: 02-11-1945   Medicare Important Message Given:  Yes    Sharone Picchi Stefan Church 04/04/2018, 12:48 PM

## 2018-04-04 NOTE — TOC Transition Note (Addendum)
Transition of Care [facility]) - CM/SW Discharge Note   Patient Details  Name: Emma [name] MRN: [id number] Date of Birth: 1945-03-02  Transition of Care [facility]) CM/SW Contact:  Eduard Roux, LCSWA Phone Number: 04/04/2018, 2:00 PM   Clinical Narrative:     Patient will DC to: [name]  DC Date: 04/04/2018 Family Notified: [name], friend Transport By: Sharin Mons   RN, patient, and facility notified of DC. Discharge Summary sent to facility. RN given number for [phone]. Ambulance transport requested for patient.   Clinical Social Worker signing off.  Antony Blackbird, MSW, [facility] Clinical Social Worker [phone]    Final next level of care: Skilled Nursing Facility Barriers to Discharge: No Barriers Identified   Patient Goals and CMS Choice Patient states their goals for this hospitalization and ongoing recovery are:: go to SNF CMS Medicare.gov Compare Post Acute Care list provided to:: Patient Choice offered to / list presented to : Patient  Discharge Placement   Existing PASRR number confirmed : 04/03/18          Patient chooses bed at: [facility] Patient to be transferred to facility by: [facility] Name of family member notified: pateint is oriented- Left voice message with her friend,[name] Patient and family notified of of transfer: 04/04/18  Discharge Plan and Services                          Social Determinants of Health (SDOH) Interventions     Readmission Risk Interventions No flowsheet data found.

## 2018-04-04 NOTE — Care Management Important Message (Signed)
Important Message  Patient Details  Name: Emma [name] MRN: [id number] Date of Birth: Sep 27, 1945   Medicare Important Message Given:  Yes    [name] Stefan Church 04/04/2018, 1:13 PM

## 2018-04-05 ENCOUNTER — Encounter (HOSPITAL_COMMUNITY): Payer: Self-pay | Admitting: Orthopedic Surgery

## 2018-04-05 DIAGNOSIS — I7389 Other specified peripheral vascular diseases: Secondary | ICD-10-CM | POA: Diagnosis not present

## 2018-04-05 DIAGNOSIS — E1159 Type 2 diabetes mellitus with other circulatory complications: Secondary | ICD-10-CM | POA: Diagnosis not present

## 2018-04-05 DIAGNOSIS — E038 Other specified hypothyroidism: Secondary | ICD-10-CM | POA: Diagnosis not present

## 2018-04-05 DIAGNOSIS — E1149 Type 2 diabetes mellitus with other diabetic neurological complication: Secondary | ICD-10-CM | POA: Diagnosis not present

## 2018-04-05 DIAGNOSIS — Z89611 Acquired absence of right leg above knee: Secondary | ICD-10-CM | POA: Diagnosis not present

## 2018-04-08 ENCOUNTER — Other Ambulatory Visit: Payer: Self-pay | Admitting: *Deleted

## 2018-04-08 DIAGNOSIS — E1149 Type 2 diabetes mellitus with other diabetic neurological complication: Secondary | ICD-10-CM | POA: Diagnosis not present

## 2018-04-08 DIAGNOSIS — I7389 Other specified peripheral vascular diseases: Secondary | ICD-10-CM | POA: Diagnosis not present

## 2018-04-08 DIAGNOSIS — E1159 Type 2 diabetes mellitus with other circulatory complications: Secondary | ICD-10-CM | POA: Diagnosis not present

## 2018-04-08 DIAGNOSIS — Z89611 Acquired absence of right leg above knee: Secondary | ICD-10-CM | POA: Diagnosis not present

## 2018-04-08 DIAGNOSIS — E038 Other specified hypothyroidism: Secondary | ICD-10-CM | POA: Diagnosis not present

## 2018-04-08 NOTE — Patient Outreach (Signed)
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  Date of Discharge: Facility: Patient was DC to: Emma [name]  DC Date: 04/04/2018 Family Notified: [name], friend Transport By: Sharin Mons  Insurance: [employer] medicare Last Admission 03/23/18 to 04/04/18    Outreach to [place] staff at [place] facility [phone] She confirmed pt was admitted on 04/04/18 and remains at the facility   Appointments: 05/10/2018 3pm staple removal MD Dr Randie Heinz   Plan: [facility] RN CM will close case at this time as patient has been discharged to [facility] facility on 04/04/18 and is receiving external care management services   Carmina Walle L. Noelle Penner, RN, BSN, CCM [facility] Telephonic Care Management Care Coordinator Direct Number [phone] Mobile number [phone]  Main [facility] number [phone] Fax number [phone]

## 2018-04-17 DIAGNOSIS — E1159 Type 2 diabetes mellitus with other circulatory complications: Secondary | ICD-10-CM | POA: Diagnosis not present

## 2018-04-17 DIAGNOSIS — E1149 Type 2 diabetes mellitus with other diabetic neurological complication: Secondary | ICD-10-CM | POA: Diagnosis not present

## 2018-04-17 DIAGNOSIS — N138 Other obstructive and reflux uropathy: Secondary | ICD-10-CM | POA: Diagnosis not present

## 2018-04-17 DIAGNOSIS — K5901 Slow transit constipation: Secondary | ICD-10-CM | POA: Diagnosis not present

## 2018-04-17 DIAGNOSIS — I7389 Other specified peripheral vascular diseases: Secondary | ICD-10-CM | POA: Diagnosis not present

## 2018-04-17 DIAGNOSIS — Z89611 Acquired absence of right leg above knee: Secondary | ICD-10-CM | POA: Diagnosis not present

## 2018-04-24 DIAGNOSIS — R5381 Other malaise: Secondary | ICD-10-CM | POA: Diagnosis not present

## 2018-04-24 DIAGNOSIS — E1159 Type 2 diabetes mellitus with other circulatory complications: Secondary | ICD-10-CM | POA: Diagnosis not present

## 2018-04-24 DIAGNOSIS — Z89611 Acquired absence of right leg above knee: Secondary | ICD-10-CM | POA: Diagnosis not present

## 2018-04-24 DIAGNOSIS — I7389 Other specified peripheral vascular diseases: Secondary | ICD-10-CM | POA: Diagnosis not present

## 2018-04-24 DIAGNOSIS — E1149 Type 2 diabetes mellitus with other diabetic neurological complication: Secondary | ICD-10-CM | POA: Diagnosis not present

## 2018-04-24 DIAGNOSIS — N138 Other obstructive and reflux uropathy: Secondary | ICD-10-CM | POA: Diagnosis not present

## 2018-04-25 DIAGNOSIS — E1151 Type 2 diabetes mellitus with diabetic peripheral angiopathy without gangrene: Secondary | ICD-10-CM | POA: Diagnosis not present

## 2018-04-25 DIAGNOSIS — Z7984 Long term (current) use of oral hypoglycemic drugs: Secondary | ICD-10-CM | POA: Diagnosis not present

## 2018-04-25 DIAGNOSIS — E1142 Type 2 diabetes mellitus with diabetic polyneuropathy: Secondary | ICD-10-CM | POA: Diagnosis not present

## 2018-04-25 DIAGNOSIS — I10 Essential (primary) hypertension: Secondary | ICD-10-CM | POA: Diagnosis not present

## 2018-04-25 DIAGNOSIS — K5901 Slow transit constipation: Secondary | ICD-10-CM | POA: Diagnosis not present

## 2018-04-25 DIAGNOSIS — N139 Obstructive and reflux uropathy, unspecified: Secondary | ICD-10-CM | POA: Diagnosis not present

## 2018-04-25 DIAGNOSIS — Z89611 Acquired absence of right leg above knee: Secondary | ICD-10-CM | POA: Diagnosis not present

## 2018-04-25 DIAGNOSIS — Z4781 Encounter for orthopedic aftercare following surgical amputation: Secondary | ICD-10-CM | POA: Diagnosis not present

## 2018-04-25 DIAGNOSIS — E039 Hypothyroidism, unspecified: Secondary | ICD-10-CM | POA: Diagnosis not present

## 2018-04-26 DIAGNOSIS — K5901 Slow transit constipation: Secondary | ICD-10-CM | POA: Diagnosis not present

## 2018-04-26 DIAGNOSIS — E1142 Type 2 diabetes mellitus with diabetic polyneuropathy: Secondary | ICD-10-CM | POA: Diagnosis not present

## 2018-04-26 DIAGNOSIS — Z7984 Long term (current) use of oral hypoglycemic drugs: Secondary | ICD-10-CM | POA: Diagnosis not present

## 2018-04-26 DIAGNOSIS — N139 Obstructive and reflux uropathy, unspecified: Secondary | ICD-10-CM | POA: Diagnosis not present

## 2018-04-26 DIAGNOSIS — I10 Essential (primary) hypertension: Secondary | ICD-10-CM | POA: Diagnosis not present

## 2018-04-26 DIAGNOSIS — E039 Hypothyroidism, unspecified: Secondary | ICD-10-CM | POA: Diagnosis not present

## 2018-04-26 DIAGNOSIS — Z4781 Encounter for orthopedic aftercare following surgical amputation: Secondary | ICD-10-CM | POA: Diagnosis not present

## 2018-04-26 DIAGNOSIS — E1151 Type 2 diabetes mellitus with diabetic peripheral angiopathy without gangrene: Secondary | ICD-10-CM | POA: Diagnosis not present

## 2018-04-26 DIAGNOSIS — Z89611 Acquired absence of right leg above knee: Secondary | ICD-10-CM | POA: Diagnosis not present

## 2018-04-30 DIAGNOSIS — Z89611 Acquired absence of right leg above knee: Secondary | ICD-10-CM | POA: Diagnosis not present

## 2018-04-30 DIAGNOSIS — I10 Essential (primary) hypertension: Secondary | ICD-10-CM | POA: Diagnosis not present

## 2018-04-30 DIAGNOSIS — E1142 Type 2 diabetes mellitus with diabetic polyneuropathy: Secondary | ICD-10-CM | POA: Diagnosis not present

## 2018-04-30 DIAGNOSIS — N139 Obstructive and reflux uropathy, unspecified: Secondary | ICD-10-CM | POA: Diagnosis not present

## 2018-04-30 DIAGNOSIS — Z4781 Encounter for orthopedic aftercare following surgical amputation: Secondary | ICD-10-CM | POA: Diagnosis not present

## 2018-04-30 DIAGNOSIS — K5901 Slow transit constipation: Secondary | ICD-10-CM | POA: Diagnosis not present

## 2018-04-30 DIAGNOSIS — E1151 Type 2 diabetes mellitus with diabetic peripheral angiopathy without gangrene: Secondary | ICD-10-CM | POA: Diagnosis not present

## 2018-04-30 DIAGNOSIS — E039 Hypothyroidism, unspecified: Secondary | ICD-10-CM | POA: Diagnosis not present

## 2018-04-30 DIAGNOSIS — Z7984 Long term (current) use of oral hypoglycemic drugs: Secondary | ICD-10-CM | POA: Diagnosis not present

## 2018-04-30 DIAGNOSIS — Z0279 Encounter for issue of other medical certificate: Secondary | ICD-10-CM

## 2018-05-03 ENCOUNTER — Encounter: Payer: Self-pay | Admitting: Vascular Surgery

## 2018-05-03 ENCOUNTER — Ambulatory Visit (INDEPENDENT_AMBULATORY_CARE_PROVIDER_SITE_OTHER): Payer: Self-pay | Admitting: Vascular Surgery

## 2018-05-03 ENCOUNTER — Other Ambulatory Visit: Payer: Self-pay

## 2018-05-03 VITALS — BP 112/70 | HR 85 | Resp 20 | Ht 62.0 in | Wt 155.0 lb

## 2018-05-03 DIAGNOSIS — Z89611 Acquired absence of right leg above knee: Secondary | ICD-10-CM

## 2018-05-03 NOTE — Progress Notes (Signed)
    Subjective:     Patient ID: Abe People, female   DOB: Mar 10, 1945, 73 y.o.   MRN: [id number]  HPI 73 year old female follows up from right above-knee amputation.  She is home from rehab now.  She has not returned to work.  She does have some pain in her right residual limb.  Has not had any wound issues.   Review of Systems Right residual limb pain    Objective:   Physical Exam Vitals:   05/03/18 1030  BP: 112/70  Pulse: 85  Resp: 20  SpO2: 97%  Awake alert oriented Right groin healing well Above-knee amputation on the right healing well all staples removed.     Assessment:     73 year old female status post right above-knee amputation after bypass failed to heal her wounds.    Plan:     Referred to [facility] to return to work when she is ready although currently still having mobility issues We will follow her up in 6 months with repeat ABIs and have counseled her on protecting her left foot.  We will also start aspirin.    Gerron Guidotti C. Randie Heinz, MD [facility] [place] Office: [phone] Pager: [phone]

## 2018-05-08 DIAGNOSIS — E669 Obesity, unspecified: Secondary | ICD-10-CM | POA: Diagnosis not present

## 2018-05-08 DIAGNOSIS — E1142 Type 2 diabetes mellitus with diabetic polyneuropathy: Secondary | ICD-10-CM | POA: Diagnosis not present

## 2018-05-08 DIAGNOSIS — I739 Peripheral vascular disease, unspecified: Secondary | ICD-10-CM | POA: Diagnosis not present

## 2018-05-08 DIAGNOSIS — E7800 Pure hypercholesterolemia, unspecified: Secondary | ICD-10-CM | POA: Diagnosis not present

## 2018-05-08 DIAGNOSIS — E039 Hypothyroidism, unspecified: Secondary | ICD-10-CM | POA: Diagnosis not present

## 2018-05-08 DIAGNOSIS — I10 Essential (primary) hypertension: Secondary | ICD-10-CM | POA: Diagnosis not present

## 2018-05-08 DIAGNOSIS — Z720 Tobacco use: Secondary | ICD-10-CM | POA: Diagnosis not present

## 2018-05-08 DIAGNOSIS — E1165 Type 2 diabetes mellitus with hyperglycemia: Secondary | ICD-10-CM | POA: Diagnosis not present

## 2018-05-10 ENCOUNTER — Encounter: Payer: Medicare HMO | Admitting: Vascular Surgery

## 2018-05-13 DIAGNOSIS — I10 Essential (primary) hypertension: Secondary | ICD-10-CM | POA: Diagnosis not present

## 2018-05-13 DIAGNOSIS — Z7984 Long term (current) use of oral hypoglycemic drugs: Secondary | ICD-10-CM | POA: Diagnosis not present

## 2018-05-13 DIAGNOSIS — E1151 Type 2 diabetes mellitus with diabetic peripheral angiopathy without gangrene: Secondary | ICD-10-CM | POA: Diagnosis not present

## 2018-05-13 DIAGNOSIS — E1142 Type 2 diabetes mellitus with diabetic polyneuropathy: Secondary | ICD-10-CM | POA: Diagnosis not present

## 2018-05-13 DIAGNOSIS — Z89611 Acquired absence of right leg above knee: Secondary | ICD-10-CM | POA: Diagnosis not present

## 2018-05-13 DIAGNOSIS — N139 Obstructive and reflux uropathy, unspecified: Secondary | ICD-10-CM | POA: Diagnosis not present

## 2018-05-13 DIAGNOSIS — Z4781 Encounter for orthopedic aftercare following surgical amputation: Secondary | ICD-10-CM | POA: Diagnosis not present

## 2018-05-13 DIAGNOSIS — E039 Hypothyroidism, unspecified: Secondary | ICD-10-CM | POA: Diagnosis not present

## 2018-05-13 DIAGNOSIS — K5901 Slow transit constipation: Secondary | ICD-10-CM | POA: Diagnosis not present

## 2018-05-15 DIAGNOSIS — K5901 Slow transit constipation: Secondary | ICD-10-CM | POA: Diagnosis not present

## 2018-05-15 DIAGNOSIS — Z7984 Long term (current) use of oral hypoglycemic drugs: Secondary | ICD-10-CM | POA: Diagnosis not present

## 2018-05-15 DIAGNOSIS — E1142 Type 2 diabetes mellitus with diabetic polyneuropathy: Secondary | ICD-10-CM | POA: Diagnosis not present

## 2018-05-15 DIAGNOSIS — I10 Essential (primary) hypertension: Secondary | ICD-10-CM | POA: Diagnosis not present

## 2018-05-15 DIAGNOSIS — N139 Obstructive and reflux uropathy, unspecified: Secondary | ICD-10-CM | POA: Diagnosis not present

## 2018-05-15 DIAGNOSIS — Z4781 Encounter for orthopedic aftercare following surgical amputation: Secondary | ICD-10-CM | POA: Diagnosis not present

## 2018-05-15 DIAGNOSIS — E1151 Type 2 diabetes mellitus with diabetic peripheral angiopathy without gangrene: Secondary | ICD-10-CM | POA: Diagnosis not present

## 2018-05-15 DIAGNOSIS — E039 Hypothyroidism, unspecified: Secondary | ICD-10-CM | POA: Diagnosis not present

## 2018-05-15 DIAGNOSIS — Z89611 Acquired absence of right leg above knee: Secondary | ICD-10-CM | POA: Diagnosis not present

## 2018-05-16 DIAGNOSIS — E1151 Type 2 diabetes mellitus with diabetic peripheral angiopathy without gangrene: Secondary | ICD-10-CM | POA: Diagnosis not present

## 2018-05-16 DIAGNOSIS — E039 Hypothyroidism, unspecified: Secondary | ICD-10-CM | POA: Diagnosis not present

## 2018-05-16 DIAGNOSIS — E1142 Type 2 diabetes mellitus with diabetic polyneuropathy: Secondary | ICD-10-CM | POA: Diagnosis not present

## 2018-05-16 DIAGNOSIS — Z89611 Acquired absence of right leg above knee: Secondary | ICD-10-CM | POA: Diagnosis not present

## 2018-05-16 DIAGNOSIS — I10 Essential (primary) hypertension: Secondary | ICD-10-CM | POA: Diagnosis not present

## 2018-05-16 DIAGNOSIS — K5901 Slow transit constipation: Secondary | ICD-10-CM | POA: Diagnosis not present

## 2018-05-16 DIAGNOSIS — N139 Obstructive and reflux uropathy, unspecified: Secondary | ICD-10-CM | POA: Diagnosis not present

## 2018-05-16 DIAGNOSIS — Z4781 Encounter for orthopedic aftercare following surgical amputation: Secondary | ICD-10-CM | POA: Diagnosis not present

## 2018-05-16 DIAGNOSIS — Z7984 Long term (current) use of oral hypoglycemic drugs: Secondary | ICD-10-CM | POA: Diagnosis not present

## 2018-05-17 DIAGNOSIS — Z4781 Encounter for orthopedic aftercare following surgical amputation: Secondary | ICD-10-CM | POA: Diagnosis not present

## 2018-05-17 DIAGNOSIS — N139 Obstructive and reflux uropathy, unspecified: Secondary | ICD-10-CM | POA: Diagnosis not present

## 2018-05-17 DIAGNOSIS — K5901 Slow transit constipation: Secondary | ICD-10-CM | POA: Diagnosis not present

## 2018-05-17 DIAGNOSIS — E1142 Type 2 diabetes mellitus with diabetic polyneuropathy: Secondary | ICD-10-CM | POA: Diagnosis not present

## 2018-05-17 DIAGNOSIS — E039 Hypothyroidism, unspecified: Secondary | ICD-10-CM | POA: Diagnosis not present

## 2018-05-17 DIAGNOSIS — E1151 Type 2 diabetes mellitus with diabetic peripheral angiopathy without gangrene: Secondary | ICD-10-CM | POA: Diagnosis not present

## 2018-05-17 DIAGNOSIS — Z7984 Long term (current) use of oral hypoglycemic drugs: Secondary | ICD-10-CM | POA: Diagnosis not present

## 2018-05-17 DIAGNOSIS — I10 Essential (primary) hypertension: Secondary | ICD-10-CM | POA: Diagnosis not present

## 2018-05-17 DIAGNOSIS — Z89611 Acquired absence of right leg above knee: Secondary | ICD-10-CM | POA: Diagnosis not present

## 2018-05-21 DIAGNOSIS — K5901 Slow transit constipation: Secondary | ICD-10-CM | POA: Diagnosis not present

## 2018-05-21 DIAGNOSIS — E1142 Type 2 diabetes mellitus with diabetic polyneuropathy: Secondary | ICD-10-CM | POA: Diagnosis not present

## 2018-05-21 DIAGNOSIS — Z7984 Long term (current) use of oral hypoglycemic drugs: Secondary | ICD-10-CM | POA: Diagnosis not present

## 2018-05-21 DIAGNOSIS — N139 Obstructive and reflux uropathy, unspecified: Secondary | ICD-10-CM | POA: Diagnosis not present

## 2018-05-21 DIAGNOSIS — I10 Essential (primary) hypertension: Secondary | ICD-10-CM | POA: Diagnosis not present

## 2018-05-21 DIAGNOSIS — Z4781 Encounter for orthopedic aftercare following surgical amputation: Secondary | ICD-10-CM | POA: Diagnosis not present

## 2018-05-21 DIAGNOSIS — E039 Hypothyroidism, unspecified: Secondary | ICD-10-CM | POA: Diagnosis not present

## 2018-05-21 DIAGNOSIS — E1151 Type 2 diabetes mellitus with diabetic peripheral angiopathy without gangrene: Secondary | ICD-10-CM | POA: Diagnosis not present

## 2018-05-21 DIAGNOSIS — Z89611 Acquired absence of right leg above knee: Secondary | ICD-10-CM | POA: Diagnosis not present

## 2018-05-22 DIAGNOSIS — E1151 Type 2 diabetes mellitus with diabetic peripheral angiopathy without gangrene: Secondary | ICD-10-CM | POA: Diagnosis not present

## 2018-05-22 DIAGNOSIS — K5901 Slow transit constipation: Secondary | ICD-10-CM | POA: Diagnosis not present

## 2018-05-22 DIAGNOSIS — E1142 Type 2 diabetes mellitus with diabetic polyneuropathy: Secondary | ICD-10-CM | POA: Diagnosis not present

## 2018-05-22 DIAGNOSIS — N139 Obstructive and reflux uropathy, unspecified: Secondary | ICD-10-CM | POA: Diagnosis not present

## 2018-05-22 DIAGNOSIS — Z7984 Long term (current) use of oral hypoglycemic drugs: Secondary | ICD-10-CM | POA: Diagnosis not present

## 2018-05-22 DIAGNOSIS — Z4781 Encounter for orthopedic aftercare following surgical amputation: Secondary | ICD-10-CM | POA: Diagnosis not present

## 2018-05-22 DIAGNOSIS — Z89611 Acquired absence of right leg above knee: Secondary | ICD-10-CM | POA: Diagnosis not present

## 2018-05-22 DIAGNOSIS — E039 Hypothyroidism, unspecified: Secondary | ICD-10-CM | POA: Diagnosis not present

## 2018-05-22 DIAGNOSIS — I10 Essential (primary) hypertension: Secondary | ICD-10-CM | POA: Diagnosis not present

## 2018-05-23 DIAGNOSIS — I739 Peripheral vascular disease, unspecified: Secondary | ICD-10-CM | POA: Diagnosis not present

## 2018-05-23 DIAGNOSIS — K5901 Slow transit constipation: Secondary | ICD-10-CM | POA: Diagnosis not present

## 2018-05-23 DIAGNOSIS — E119 Type 2 diabetes mellitus without complications: Secondary | ICD-10-CM | POA: Diagnosis not present

## 2018-05-23 DIAGNOSIS — E1151 Type 2 diabetes mellitus with diabetic peripheral angiopathy without gangrene: Secondary | ICD-10-CM | POA: Diagnosis not present

## 2018-05-23 DIAGNOSIS — N139 Obstructive and reflux uropathy, unspecified: Secondary | ICD-10-CM | POA: Diagnosis not present

## 2018-05-23 DIAGNOSIS — E1142 Type 2 diabetes mellitus with diabetic polyneuropathy: Secondary | ICD-10-CM | POA: Diagnosis not present

## 2018-05-23 DIAGNOSIS — Z89611 Acquired absence of right leg above knee: Secondary | ICD-10-CM | POA: Diagnosis not present

## 2018-05-23 DIAGNOSIS — I10 Essential (primary) hypertension: Secondary | ICD-10-CM | POA: Diagnosis not present

## 2018-05-23 DIAGNOSIS — E039 Hypothyroidism, unspecified: Secondary | ICD-10-CM | POA: Diagnosis not present

## 2018-05-23 DIAGNOSIS — Z4781 Encounter for orthopedic aftercare following surgical amputation: Secondary | ICD-10-CM | POA: Diagnosis not present

## 2018-05-23 DIAGNOSIS — Z7984 Long term (current) use of oral hypoglycemic drugs: Secondary | ICD-10-CM | POA: Diagnosis not present

## 2018-05-27 DIAGNOSIS — Z89611 Acquired absence of right leg above knee: Secondary | ICD-10-CM | POA: Diagnosis not present

## 2018-05-27 DIAGNOSIS — I10 Essential (primary) hypertension: Secondary | ICD-10-CM | POA: Diagnosis not present

## 2018-05-27 DIAGNOSIS — E039 Hypothyroidism, unspecified: Secondary | ICD-10-CM | POA: Diagnosis not present

## 2018-05-27 DIAGNOSIS — E1142 Type 2 diabetes mellitus with diabetic polyneuropathy: Secondary | ICD-10-CM | POA: Diagnosis not present

## 2018-05-27 DIAGNOSIS — Z7984 Long term (current) use of oral hypoglycemic drugs: Secondary | ICD-10-CM | POA: Diagnosis not present

## 2018-05-27 DIAGNOSIS — K5901 Slow transit constipation: Secondary | ICD-10-CM | POA: Diagnosis not present

## 2018-05-27 DIAGNOSIS — E1151 Type 2 diabetes mellitus with diabetic peripheral angiopathy without gangrene: Secondary | ICD-10-CM | POA: Diagnosis not present

## 2018-05-27 DIAGNOSIS — N139 Obstructive and reflux uropathy, unspecified: Secondary | ICD-10-CM | POA: Diagnosis not present

## 2018-05-27 DIAGNOSIS — Z4781 Encounter for orthopedic aftercare following surgical amputation: Secondary | ICD-10-CM | POA: Diagnosis not present

## 2018-05-30 DIAGNOSIS — K5901 Slow transit constipation: Secondary | ICD-10-CM | POA: Diagnosis not present

## 2018-05-30 DIAGNOSIS — E1142 Type 2 diabetes mellitus with diabetic polyneuropathy: Secondary | ICD-10-CM | POA: Diagnosis not present

## 2018-05-30 DIAGNOSIS — Z89611 Acquired absence of right leg above knee: Secondary | ICD-10-CM | POA: Diagnosis not present

## 2018-05-30 DIAGNOSIS — E1151 Type 2 diabetes mellitus with diabetic peripheral angiopathy without gangrene: Secondary | ICD-10-CM | POA: Diagnosis not present

## 2018-05-30 DIAGNOSIS — E039 Hypothyroidism, unspecified: Secondary | ICD-10-CM | POA: Diagnosis not present

## 2018-05-30 DIAGNOSIS — N139 Obstructive and reflux uropathy, unspecified: Secondary | ICD-10-CM | POA: Diagnosis not present

## 2018-05-30 DIAGNOSIS — Z4781 Encounter for orthopedic aftercare following surgical amputation: Secondary | ICD-10-CM | POA: Diagnosis not present

## 2018-05-30 DIAGNOSIS — Z7984 Long term (current) use of oral hypoglycemic drugs: Secondary | ICD-10-CM | POA: Diagnosis not present

## 2018-05-30 DIAGNOSIS — I10 Essential (primary) hypertension: Secondary | ICD-10-CM | POA: Diagnosis not present

## 2018-06-06 DIAGNOSIS — E1142 Type 2 diabetes mellitus with diabetic polyneuropathy: Secondary | ICD-10-CM | POA: Diagnosis not present

## 2018-06-06 DIAGNOSIS — Z7984 Long term (current) use of oral hypoglycemic drugs: Secondary | ICD-10-CM | POA: Diagnosis not present

## 2018-06-06 DIAGNOSIS — E039 Hypothyroidism, unspecified: Secondary | ICD-10-CM | POA: Diagnosis not present

## 2018-06-06 DIAGNOSIS — Z4781 Encounter for orthopedic aftercare following surgical amputation: Secondary | ICD-10-CM | POA: Diagnosis not present

## 2018-06-06 DIAGNOSIS — N139 Obstructive and reflux uropathy, unspecified: Secondary | ICD-10-CM | POA: Diagnosis not present

## 2018-06-06 DIAGNOSIS — Z89611 Acquired absence of right leg above knee: Secondary | ICD-10-CM | POA: Diagnosis not present

## 2018-06-06 DIAGNOSIS — K5901 Slow transit constipation: Secondary | ICD-10-CM | POA: Diagnosis not present

## 2018-06-06 DIAGNOSIS — I10 Essential (primary) hypertension: Secondary | ICD-10-CM | POA: Diagnosis not present

## 2018-06-06 DIAGNOSIS — E1151 Type 2 diabetes mellitus with diabetic peripheral angiopathy without gangrene: Secondary | ICD-10-CM | POA: Diagnosis not present

## 2018-06-23 DIAGNOSIS — I739 Peripheral vascular disease, unspecified: Secondary | ICD-10-CM | POA: Diagnosis not present

## 2018-06-23 DIAGNOSIS — E119 Type 2 diabetes mellitus without complications: Secondary | ICD-10-CM | POA: Diagnosis not present

## 2018-06-23 DIAGNOSIS — Z89611 Acquired absence of right leg above knee: Secondary | ICD-10-CM | POA: Diagnosis not present

## 2018-06-24 DIAGNOSIS — E1165 Type 2 diabetes mellitus with hyperglycemia: Secondary | ICD-10-CM | POA: Diagnosis not present

## 2018-06-24 DIAGNOSIS — Z720 Tobacco use: Secondary | ICD-10-CM | POA: Diagnosis not present

## 2018-06-24 DIAGNOSIS — E1142 Type 2 diabetes mellitus with diabetic polyneuropathy: Secondary | ICD-10-CM | POA: Diagnosis not present

## 2018-06-24 DIAGNOSIS — Z1329 Encounter for screening for other suspected endocrine disorder: Secondary | ICD-10-CM | POA: Diagnosis not present

## 2018-06-24 DIAGNOSIS — E039 Hypothyroidism, unspecified: Secondary | ICD-10-CM | POA: Diagnosis not present

## 2018-06-24 DIAGNOSIS — I10 Essential (primary) hypertension: Secondary | ICD-10-CM | POA: Diagnosis not present

## 2018-06-24 DIAGNOSIS — Z0001 Encounter for general adult medical examination with abnormal findings: Secondary | ICD-10-CM | POA: Diagnosis not present

## 2018-06-24 DIAGNOSIS — I739 Peripheral vascular disease, unspecified: Secondary | ICD-10-CM | POA: Diagnosis not present

## 2018-06-24 DIAGNOSIS — E7800 Pure hypercholesterolemia, unspecified: Secondary | ICD-10-CM | POA: Diagnosis not present

## 2018-07-09 ENCOUNTER — Telehealth: Payer: Self-pay

## 2018-07-09 NOTE — Telephone Encounter (Signed)
Pt called "on behalf of [facility]" in an effort to have additional "medical notes sent to them from Dr. Donzetta Matters or Dr. Carlis Abbott", so that she can proceed with getting her prosthesis. I have informed Alyse Low of this request and she is going to work on it. Pt was notified of this update.

## 2018-07-12 DIAGNOSIS — Z89611 Acquired absence of right leg above knee: Secondary | ICD-10-CM | POA: Diagnosis not present

## 2018-07-15 ENCOUNTER — Ambulatory Visit: Payer: Medicare HMO | Admitting: Physical Therapy

## 2018-07-17 ENCOUNTER — Ambulatory Visit: Payer: Medicare HMO | Attending: Internal Medicine | Admitting: Physical Therapy

## 2018-07-17 ENCOUNTER — Other Ambulatory Visit: Payer: Self-pay

## 2018-07-17 ENCOUNTER — Encounter: Payer: Self-pay | Admitting: Physical Therapy

## 2018-07-17 DIAGNOSIS — R2689 Other abnormalities of gait and mobility: Secondary | ICD-10-CM | POA: Insufficient documentation

## 2018-07-17 DIAGNOSIS — R2681 Unsteadiness on feet: Secondary | ICD-10-CM | POA: Insufficient documentation

## 2018-07-17 DIAGNOSIS — M25651 Stiffness of right hip, not elsewhere classified: Secondary | ICD-10-CM | POA: Diagnosis not present

## 2018-07-17 DIAGNOSIS — R293 Abnormal posture: Secondary | ICD-10-CM | POA: Insufficient documentation

## 2018-07-17 DIAGNOSIS — M6281 Muscle weakness (generalized): Secondary | ICD-10-CM

## 2018-07-18 NOTE — Therapy (Signed)
[facility] [address] [place], [place], [address] Phone: [phone]   Fax:  [phone]  Physical Therapy Evaluation  Patient Details  Name: Emma [name] MRN: [id number] Date of Birth: September 27, 1945 Referring Provider (PT): Benito Mccreedy, MD   Encounter Date: 07/17/2018   CLINIC OPERATION CHANGES: Outpatient [facility] Re[facility] is open at lower capacity following universal masking, social distancing, and patient screening.  The patient's COVID risk of complications score is 4.   PT End of Session - 07/17/18 1252    Visit Number  1    Number of Visits  25    Date for PT Re-Evaluation  10/15/18    Authorization Type  [employer]  Medicare    PT Start Time  1200    PT Stop Time  1250    PT Time Calculation (min)  50 min    Equipment Utilized During Treatment  Gait belt    Activity Tolerance  Patient tolerated treatment well;Patient limited by fatigue       Past Medical History:  Diagnosis Date  . Arthritis    Back and right shoulder  . Constipation   . Diabetes mellitus    Type II  . Diabetes mellitus without complication ([facility])   . Diabetic neuropathy ([facility])   . History of blood transfusion   . Hypercholesteremia   . Hypertension   . Hypothyroidism   . Pneumonia   . Thyroid disease     Past Surgical History:  Procedure Laterality Date  . ABDOMINAL AORTOGRAM N/A 03/25/2018   Procedure: ABDOMINAL AORTOGRAM;  Surgeon: Waynetta Sandy, MD;  Location: [facility];  Service: Cardiovascular;  Laterality: N/A;  . ABDOMINAL HYSTERECTOMY    . AMPUTATION Right 03/29/2018   Procedure: AMPUTATION ABOVE KNEE;  Surgeon: Serafina Mitchell, MD;  Location: [facility];  Service: Vascular;  Laterality: Right;  . APPENDECTOMY    . BACK SURGERY    . BYPASS GRAFT FEMORAL-PERONEAL Right 03/26/2018   Procedure: BYPASS GRAFT [place];  Surgeon: Waynetta Sandy, MD;  Location: [facility];  Service: Vascular;   Laterality: Right;  . EXPLORATORY LAPAROTOMY     Adhesions removed and appendix  . FEMORAL-POPLITEAL BYPASS GRAFT Right 03/27/2018   Procedure: Right lower extremity angiogram, Right Femoral Peroneal artery bypass, Angioplasty Right Femoral Peroneal bypass, Thrombectomy of Femoral peroneal artery;  Surgeon: Waynetta Sandy, MD;  Location: [facility];  Service: Vascular;  Laterality: Right;  . INTRAOPERATIVE ARTERIOGRAM Right 03/26/2018   Procedure: Intra Operative Arteriogram;  Surgeon: Waynetta Sandy, MD;  Location: [facility];  Service: Vascular;  Laterality: Right;  . LOWER EXTREMITY ANGIOGRAPHY Bilateral 03/25/2018   Procedure: LOWER EXTREMITY ANGIOGRAPHY;  Surgeon: Waynetta Sandy, MD;  Location: [facility];  Service: Cardiovascular;  Laterality: Bilateral;  . LUMBAR LAMINECTOMY/DECOMPRESSION MICRODISCECTOMY N/A 09/09/2014   Procedure: L4-L5 DECOMPRESSION ;  Surgeon: Melina Schools, MD;  Location: [facility];  Service: Orthopedics;  Laterality: N/A;  . PATCH ANGIOPLASTY Right 03/26/2018   Procedure: PATCH ANGIOPLASTY;  Surgeon: Waynetta Sandy, MD;  Location: [facility];  Service: Vascular;  Laterality: Right;  . SHOULDER SURGERY    . THYROID SURGERY    . TONSILLECTOMY    . TOTAL THYROIDECTOMY    . VEIN HARVEST Right 03/26/2018   Procedure: Vein Harvest of Right Leg Greater Saphenous Vein in SITU;  Surgeon: Waynetta Sandy, MD;  Location: [facility];  Service: Vascular;  Laterality: Right;    There were no vitals filed for this  visit.   Subjective Assessment - 07/17/18 1205    Subjective  This 72yo female was referred on 07/03/2018 for PT evaluation with Right Transfemoral Amputation by Jackie PlumGeorge Osei-Bonsu, MD. She underwent a right Transfemoral Amputation on 03/29/2018 from failed Femoral Peroneal Bypass Surgery. She received her prosthesis on 07/12/2018.    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  Yes    Pain Score  5     Pain Location  Leg    Pain Orientation  Right;Medial   bypass graft site   Pain Descriptors / Indicators  Burning    Pain Type  Acute pain    Pain Onset  More than a month ago    Pain Frequency  Intermittent    Aggravating Factors   unknown    Pain Relieving Factors  massage it    Multiple Pain Sites  Yes    Pain Score  0   2-10/10   Pain Location  Other (Comment)   phantom   Pain Descriptors / Indicators  Throbbing;Shooting    Pain Type  Phantom pain    Pain Onset  More than a month ago    Pain Frequency  Intermittent    Aggravating Factors   unknown    Pain Relieving Factors  gabapentin         [facility] PT Assessment - 07/17/18 1200      Assessment   Medical Diagnosis  RIght Transfemoral Amputation    Referring Provider (PT)  Jackie PlumGeorge Osei-Bonsu, MD    Onset Date/Surgical Date  07/12/18    Hand Dominance  Left    Prior Therapy  inpatient rehab      Precautions   Precautions  Fall      Balance Screen   Has the patient fallen in the past 6 months  No    Has the patient had a decrease in activity level because of a fear of falling?   Yes    Is the patient reluctant to leave their home because of a fear of falling?   Yes      Home Environment   Living Environment  Private residence    Living Arrangements  Alone    Available Help at Discharge  Family    Type of Home  Apartment    Home Access  Ramped entrance    Home Layout  One level   laundry in apt   Home Equipment  [place] LakeWalker - 2 wheels;Cane - single point;Bedside commode;Wheelchair - manual      Prior Function   Level of Independence  Independent;Independent with household mobility with device;Independent with community mobility with device    Vocation  Part time employment    Vocation Requirements  walk in/out building, no lifting   will work from home due to [employer]OVID    Leisure  shop,  read, dance,       Posture/Postural Control   Posture/Postural Control  Postural limitations    Postural Limitations  Rounded Shoulders;Forward head;Increased lumbar lordosis;Flexed trunk;Weight shift left      ROM / Strength   AROM / PROM / Strength  PROM;Strength      PROM   Overall PROM   Deficits      Strength   Overall Strength  Deficits    Overall Strength Comments  Bilateral UEs shoulders 4/5 in available AROM (limited to ~100* flexion)    Strength Assessment Site  Hip;Knee;Ankle    Right Hip Flexion  3+/5    Right Hip Extension  3-/5    Right Hip ABduction  3/5    Left Hip Flexion  4/5    Left Hip Extension  4/5   gross testing with activities   Left Hip ABduction  4/5   gross testing with activities   Right/Left Knee  Left    Left Knee Flexion  4/5   gross testing with activities   Left Knee Extension  4/5    Right/Left Ankle  Left    Left Ankle Dorsiflexion  4/5      Right Hip   Right Hip Extension  -33   Thomas position supine     Transfers   Transfers  Sit to Stand;Stand to [employer] to Stand  4: Min assist;With upper extremity assist;With armrests;From chair/3-in-1   To RW for stabilization,    Sit to Stand Details  Visual cues for safe use of DME/AE;Verbal cues for sequencing;Verbal cues for technique;Verbal cues for safe use of DME/AE    Sit to Stand Details (indicate cue type and reason)  requires PT instruction to control prosthetic knee    Stand to Sit  4: Min assist;With upper extremity assist;With armrests;To chair/3-in-1   from RW for stability   Stand to Sit Details (indicate cue type and reason)  Visual cues for safe use of DME/AE;Verbal cues for sequencing;Verbal cues for technique;Verbal cues for safe use of DME/AE    Stand to Sit Details  requires PT instruction to control prosthetic knee      Ambulation/Gait   Ambulation/Gait  Yes    Ambulation/Gait Assistance  3: Mod assist    Ambulation/Gait Assistance Details  PT demo, instructed in  proper prosthetic control & sequencing prior to ambulating.  PT manually assisting prosthetic knee control.     Ambulation Distance (Feet)  10 Feet    Assistive device  Rolling walker;Prosthesis    Gait Pattern  Step-to pattern;Decreased step length - left;Decreased stance time - right;Decreased stride length;Decreased hip/knee flexion - right;Decreased weight shift to right;Right circumduction;Right hip hike;Antalgic;Lateral hip instability;Trunk flexed;Abducted- right;Poor foot clearance - right    Ambulation Surface  Level;Indoor      Balance   Balance Assessed  Yes      Static Standing Balance   Static Standing - Balance Support  Bilateral upper extremity supported   RW support   Static Standing - Level of Assistance  5: Stand by assistance    Static Standing - Comment/# of Minutes  1 minutes but PT has to position prosthesis in stable position first      Dynamic Standing Balance   Dynamic Standing - Balance Support  Bilateral upper extremity supported;Right upper extremity supported;During functional activity   scans BUE support & reaches with dominant UE   Dynamic Standing - Level of Assistance  4: Min assist    Dynamic Standing - Balance Activities  Eyes opn;Head nods;Head turns;Reaching for objects    Dynamic Standing - Comments  scans with head motions only no trunk involvement,  reaches anterior to RW within arm's length with dominant UE.        Prosthetics Assessment - 07/17/18 1200      Prosthetics   Prosthetic Care Dependent with  Skin check;Residual limb care;Care of non-amputated limb;Prosthetic cleaning;Ply sock cleaning;Correct ply sock adjustment;Proper wear schedule/adjustment;Proper weight-bearing schedule/adjustment  Donning prosthesis   Mod assist    Doffing prosthesis   Min assist    Current prosthetic wear tolerance (days/week)   liner only (no prosthesis) 4 of 5 days since delivery    Current prosthetic wear tolerance (#hours/day)   liner only 4 hours 1x/day     Current prosthetic weight-bearing tolerance (hours/day)   Patient tolerated standing for 5 minutes with partial weight on prosthesis with no limb pain but LLE fatigue vascular pain.     Edema  Pitting edema, Patient has not been wearing shrinker since prosthesis delivery.  PT advised of need to wear when not wearing prosthesis to control edema.     Residual limb condition   dry scabs on incision, dry skin, normal temperature & color    K code/activity level with prosthetic use   K2 silicon liner with velcro lanyard suspension, ischial containment socket with proximal flexible inner liner, Multiaxial prosthetic knee non-hydraulic, single axis foot.                 Objective measurements completed on examination: See above findings.      [facility] Adult PT Treatment/Exercise - 07/17/18 1200      Prosthetics   Prosthetic Care Comments   Wear liner only for safety, due to dependency with prosthesis.     Education Provided  Skin check;Residual limb care;Prosthetic cleaning;Correct ply sock adjustment;Proper Donning;Proper Doffing;Proper wear schedule/adjustment    Person(s) Educated  Patient    Education Method  Explanation;Demonstration;Tactile cues;Verbal cues    Education Method  Verbalized understanding;Returned demonstration;Tactile cues required;Verbal cues required;Needs further instruction               PT Short Term Goals - 07/18/18 1141      PT SHORT TERM GOAL #1   Title  Patient donnes prosthesis including tightening suspension strap in standing with supervision. (ALL STGs Target Date: 08/16/2018)    Time  4    Period  Weeks    Status  New    Target Date  08/16/18      PT SHORT TERM GOAL #2   Title  Patient tolerates wearing prosthesis & liner >8hrs total /day without skin issues.    Time  4    Period  Weeks    Status  New    Target Date  08/16/18      PT SHORT TERM GOAL #3   Title  Patient sit to/from stand chairs with armrests to RW with prosthesis safely  with supervision.    Time  4    Period  Weeks    Status  New    Target Date  08/16/18      PT SHORT TERM GOAL #4   Title  Patient ambulates 550' with RW & prosthesis with minA.    Time  4    Period  Weeks    Status  New    Target Date  08/16/18      PT SHORT TERM GOAL #5   Title  Standing balance with RW support reaches 2" anteriorly & to knee level with supervision.    Time  4    Period  Weeks    Status  New    Target Date  08/16/18        PT Long Term Goals - 07/18/18 1137      PT LONG TERM GOAL #1   Title  Patient demonstrates & verbalizes proper prosthetic care to enable safe use of prosthesis.  (All LTGs Target  Date: 10/11/2018)    Time  12    Period  Weeks    Status  New    Target Date  10/11/18      PT LONG TERM GOAL #2   Title  Patient tolerates prosthesis wear >80% of awake hours to improve ability to function during her day.    Time  12    Period  Weeks    Status  New    Target Date  10/11/18      PT LONG TERM GOAL #3   Title  Patient standing balance with RW support & prosthesis: static no UE support 1 minute, dynamic RW support scans, reaches 7" anteriorly & manages clothes modified independent.    Time  12    Period  Weeks    Status  New    Target Date  10/11/18      PT LONG TERM GOAL #4   Title  Patient ambulates 200' with RW & prosthesis modifiied independent.    Time  12    Period  Weeks    Status  New    Target Date  10/11/18      PT LONG TERM GOAL #5   Title  Patient negotiates ramps & curbs with RW & prosthesis modified independent.    Time  12    Period  Weeks    Status  New    Target Date  10/11/18             Plan - 07/18/18 1125    Clinical Impression Statement  This 72yo female underwent a right Transfemoral Amputation on 07/03/2018 due to vascular issues and has been w/c bound since that time. She has become deconditioned with weakness & impaired endurance. She received her first prosthesis 07/12/2018 (5 days prior to PT  evaluation) and is dependent in use & care. She requires modA to donne prosthesis and is dependent in sit to/from stand with prosthesis.  She is only wearing liner due to safety issues with prosthesis currently and has worn liner 4 of 5 days for 4hrs. Limited wear limits functional use, increases fall risk & increases energy expenditure.  Patient is dependent in standing balance requiring PT to position for static stance with RW support & assistance for dynamic activities of scanning & reaching to front of RW only.  PT had to instruct prior to gait & manually assist during gait. She ambulated 10' with RW & Prosthesis with moderate assist. Patient would benefit skilled PT to improve prosthetic wear & use, mobility & safety.    Personal Factors and Comorbidities  Age;Comorbidity 3+;Fitness;Past/Current Experience;Time since onset of injury/illness/exacerbation;Other;Transportation   lives alone so limited ability to practice tasks that require assist or supervision   Comorbidities  R TFA, L4-5 decompression sg 2016, DM, HTN,    Examination-Activity Limitations  Bend;Caring for Others;Carry;Locomotion Level;Reach Overhead;Stairs;Stand;Toileting;Transfers    Examination-Participation Restrictions  Community Activity;Church;Meal Prep    Stability/Clinical Decision Making  Evolving/Moderate complexity    Clinical Decision Making  Moderate    Rehab Potential  Good    PT Frequency  2x / week    PT Duration  12 weeks    PT Treatment/Interventions  ADLs/Self Care Home Management;DME Instruction;Gait training;Stair training;Functional mobility training;Therapeutic activities;Therapeutic exercise;Balance training;Neuromuscular re-education;Patient/family education;Prosthetic Training;Vestibular    PT Next Visit Plan  review prosthetic care, HEP at sink for standing balance & stregth, standing balance & gait with RW    Consulted and Agree with Plan of Care  Patient  Patient will benefit from skilled  therapeutic intervention in order to improve the following deficits and impairments:  Abnormal gait, Decreased activity tolerance, Decreased balance, Decreased endurance, Decreased knowledge of use of DME, Decreased mobility, Decreased range of motion, Decreased strength, Dizziness, Increased edema, Impaired flexibility, Postural dysfunction, Prosthetic Dependency, Pain, Obesity  Visit Diagnosis: 1. Other abnormalities of gait and mobility   2. Unsteadiness on feet   3. Abnormal posture   4. Muscle weakness (generalized)   5. Stiffness of right hip, not elsewhere classified        Problem List Patient Active Problem List   Diagnosis Date Noted  . PAD (peripheral artery disease) ([facility]) 03/23/2018  . Back pain 09/09/2014  . HYPERLIPIDEMIA 12/18/2006  . RECTAL POLYPS 12/18/2006  . 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 SBO 08/01/2006  . GOITER NOS 03/11/2006  . PERIPHERAL NEUROPATHY 03/11/2006  . HYPERTENSION 03/11/2006  . OSTEOPENIA 03/11/2006  . FOOT DROP 03/11/2006    Carter Kaman PT, DPT 07/18/2018, 11:46 AM  [facility] [address] [place], [place], [address] Phone: [phone]   Fax:  [phone]  Name: Emma [name] MRN: [id number] Date of Birth: 1945-01-29

## 2018-07-18 NOTE — Addendum Note (Signed)
Addended by: Isaias Cowman on: 07/18/2018 10:30 PM   Modules accepted: Orders

## 2018-07-19 NOTE — Addendum Note (Signed)
Addended by: Isaias Cowman on: 07/19/2018 09:43 AM   Modules accepted: Orders

## 2018-07-22 ENCOUNTER — Other Ambulatory Visit: Payer: Self-pay

## 2018-07-22 ENCOUNTER — Ambulatory Visit: Payer: Medicare HMO | Admitting: Physical Therapy

## 2018-07-22 ENCOUNTER — Encounter: Payer: Self-pay | Admitting: Physical Therapy

## 2018-07-22 DIAGNOSIS — M6281 Muscle weakness (generalized): Secondary | ICD-10-CM

## 2018-07-22 DIAGNOSIS — M25651 Stiffness of right hip, not elsewhere classified: Secondary | ICD-10-CM | POA: Diagnosis not present

## 2018-07-22 DIAGNOSIS — R2681 Unsteadiness on feet: Secondary | ICD-10-CM | POA: Diagnosis not present

## 2018-07-22 DIAGNOSIS — R2689 Other abnormalities of gait and mobility: Secondary | ICD-10-CM | POA: Diagnosis not present

## 2018-07-22 DIAGNOSIS — R293 Abnormal posture: Secondary | ICD-10-CM | POA: Diagnosis not present

## 2018-07-22 NOTE — Therapy (Signed)
Emma [name], [id number] Phone: [phone]   Fax:  [phone]  Physical Therapy Treatment  Patient Details  Name: Emma PeopleDeitra [name] MRN: [id number] Date of Birth: 04/12/1945 Referring Provider (PT): Emma PlumGeorge Osei-Bonsu, MD   Encounter Date: 07/22/2018  PT End of Session - 07/22/18 1953    Visit Number  2    Number of Visits  25    Date for PT Re-Evaluation  10/15/18    Authorization Type  HUMANA  Medicare    PT Start Time  1800    PT Stop Time  1855    PT Time Calculation (min)  55 min    Equipment Utilized During Treatment  Gait belt    Activity Tolerance  Patient tolerated treatment well;Patient limited by fatigue       Past Medical History:  Diagnosis Date  . Arthritis    Back and right shoulder  . Constipation   . Diabetes mellitus    Type II  . Diabetes mellitus without complication (HCC)   . Diabetic neuropathy (HCC)   . History of blood transfusion   . Hypercholesteremia   . Hypertension   . Hypothyroidism   . Pneumonia   . Thyroid disease     Past Surgical History:  Procedure Laterality Date  . ABDOMINAL AORTOGRAM N/A 03/25/2018   Procedure: ABDOMINAL AORTOGRAM;  Surgeon: Maeola Harmanain, Brandon Christopher, MD;  [facility];  Service: Cardiovascular;  Laterality: N/A;  . ABDOMINAL HYSTERECTOMY    . AMPUTATION Right 03/29/2018   Procedure: AMPUTATION ABOVE KNEE;  Surgeon: Nada LibmanBrabham, Vance W, MD;  [facility];  Service: Vascular;  Laterality: Right;  . APPENDECTOMY    . BACK SURGERY    . BYPASS GRAFT FEMORAL-PERONEAL Right 03/26/2018   Procedure: BYPASS GRAFT [place]ighbor[place]EMORAL-PERONEAL;  Surgeon: Maeola Harmanain, Brandon Christopher, MD;  [facility];  Service: Vascular;  Laterality: Right;  . EXPLORATORY LAPAROTOMY     Adhesions removed and appendix  . FEMORAL-POPLITEAL BYPASS GRAFT Right 03/27/2018   Procedure: Right lower extremity angiogram, Right Femoral Peroneal artery  bypass, Angioplasty Right Femoral Peroneal bypass, Thrombectomy of Femoral peroneal artery;  Surgeon: Maeola Harmanain, Brandon Christopher, MD;  [facility];  Service: Vascular;  Laterality: Right;  . INTRAOPERATIVE ARTERIOGRAM Right 03/26/2018   Procedure: Intra Operative Arteriogram;  Surgeon: Maeola Harmanain, Brandon Christopher, MD;  [facility];  Service: Vascular;  Laterality: Right;  . LOWER EXTREMITY ANGIOGRAPHY Bilateral 03/25/2018   Procedure: LOWER EXTREMITY ANGIOGRAPHY;  Surgeon: Maeola Harmanain, Brandon Christopher, MD;  [facility];  Service: Cardiovascular;  Laterality: Bilateral;  . LUMBAR LAMINECTOMY/DECOMPRESSION MICRODISCECTOMY N/A 09/09/2014   Procedure: L4-L5 DECOMPRESSION ;  Surgeon: Venita Lickahari Brooks, MD;  [facility];  Service: Orthopedics;  Laterality: N/A;  . PATCH ANGIOPLASTY Right 03/26/2018   Procedure: PATCH ANGIOPLASTY;  Surgeon: Maeola Harmanain, Brandon Christopher, MD;  [facility];  Service: Vascular;  Laterality: Right;  . SHOULDER SURGERY    . THYROID SURGERY    . TONSILLECTOMY    . TOTAL THYROIDECTOMY    . VEIN HARVEST Right 03/26/2018   Procedure: Vein Harvest of Right Leg Greater Saphenous Vein in SITU;  Surgeon: Maeola Harmanain, Brandon Christopher, MD;  [facility];  Service: Vascular;  Laterality: Right;    There were no vitals filed for this visit.  Subjective Assessment - 07/22/18 1803    Subjective  Pt fell transferring from toilet to w/c, fell between them on Friday night 07/19/18. Called friend and friend came Sunday  morning and helped her up. (Pt did not call 911 due to anxiety about them coming into her home). Fell Sunday night, 07/21/18 sliding off side of the bed; called friend Monday morning but maintence man and property manager came before and were able to get her up.  Pt reports no injury.    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    Pain Onset  More than a month ago    Pain Onset  More than a month ago                       [facility] Treatment/Exercise - 07/22/18 0001      Transfers   Transfers  Sit to Stand;Stand to Sit    Sit to Stand  4: Min assist;With upper extremity assist;Without upper extremity assist   from mat and w/c   Sit to Stand Details  Visual cues for safe use of DME/AE;Verbal cues for sequencing;Verbal cues for technique;Verbal cues for safe use of DME/AE    Sit to Stand Details (indicate cue type and reason)  requires PTA instruction to control prosthetic knee    Stand to Sit  4: Min assist;With upper extremity assist;With armrests;To chair/3-in-1    Stand to Sit Details (indicate cue type and reason)  Visual cues for safe use of DME/AE;Verbal cues for sequencing;Verbal cues for technique;Verbal cues for safe use of DME/AE    Stand to Sit Details  requires PTA instruction to control prosthetic knee      Prosthetics   Prosthetic Care Comments   Wear liner only for safety, due to dependency with prosthesis.     Current prosthetic wear tolerance (days/week)   wore liner 4hrs 2x/day for a few days but had it off Saturday due to fall incident                                         Current prosthetic wear tolerance (#hours/day)   liner only 4 hours 2x/day    Current prosthetic weight-bearing tolerance (hours/day)   Patient tolerated standing for 3 minutes with partial weight on prosthesis with no limb pain but LLE fatigue vascular pain.     Edema  Pitting edema    Residual limb condition   no scabs, dry skin, normal tempreture and color    Education Provided  Skin check;Residual limb care;Prosthetic cleaning;Correct ply sock adjustment;Proper Donning;Proper Doffing;Proper wear schedule/adjustment    Person(s) Educated  Patient    Education Method  Explanation;Demonstration;Tactile cues;Verbal cues    Education Method  Verbalized understanding;Returned  demonstration;Tactile cues required;Verbal cues required;Needs further instruction    Donning Prosthesis  Maximum assist    Doffing Prosthesis  Moderate assist             PT Education - 07/22/18 1950    Education Details  Checked w/c brakes due to falls 2x and breaks hold with force but pt stated her bathroom floor feels slippery.  Educated pt on how to move leg rest and armrest out of the way to allow for w/c to be closer to tranferring surface.    Person(s) Educated  Patient    Methods  Explanation;Demonstration;Tactile cues;Verbal cues    Comprehension  Verbalized understanding;Returned demonstration;Verbal cues required;Tactile cues required;Need further instruction       PT Short Term Goals - 07/18/18 1141      PT SHORT TERM GOAL #1   Title  Patient donnes prosthesis including tightening suspension strap in standing with supervision. (ALL STGs Target Date: 08/16/2018)    Time  4    Period  Weeks    Status  New    Target Date  08/16/18      PT SHORT TERM GOAL #2   Title  Patient tolerates wearing prosthesis & liner >8hrs total /day without skin issues.    Time  4    Period  Weeks    Status  New    Target Date  08/16/18      PT SHORT TERM GOAL #3   Title  Patient sit to/from stand chairs with armrests to RW with prosthesis safely with supervision.    Time  4    Period  Weeks    Status  New    Target Date  08/16/18      PT SHORT TERM GOAL #4   Title  Patient ambulates 2' with RW & prosthesis with minA.    Time  4    Period  Weeks    Status  New    Target Date  08/16/18      PT SHORT TERM GOAL #5   Title  Standing balance with RW support reaches 2" anteriorly & to knee level with supervision.    Time  4    Period  Weeks    Status  New    Target Date  08/16/18        PT Long Term Goals - 07/18/18 1137      PT LONG TERM GOAL #1   Title  Patient demonstrates & verbalizes proper prosthetic care to enable safe use of prosthesis.  (All LTGs Target Date:  10/11/2018)    Time  12    Period  Weeks    Status  New    Target Date  10/11/18      PT LONG TERM GOAL #2   Title  Patient tolerates prosthesis wear >80% of awake hours to improve ability to function during her day.    Time  12    Period  Weeks    Status  New    Target Date  10/11/18      PT LONG TERM GOAL #3   Title  Patient standing balance with RW support & prosthesis: static no UE support 1 minute, dynamic RW support scans, reaches 7" anteriorly & manages clothes modified independent.    Time  12    Period  Weeks    Status  New    Target Date  10/11/18      PT LONG TERM GOAL #4   Title  Patient ambulates 200' with RW & prosthesis modifiied independent.    Time  12    Period  Weeks    Status  New    Target Date  10/11/18      PT LONG TERM GOAL #5   Title  Patient negotiates ramps & curbs with RW & prosthesis modified independent.    Time  12    Period  Weeks    Status  New    Target Date  10/11/18            Plan - 07/22/18 1953    Clinical Impression Statement  Due to pt having x2  falls since last session, skilled session focused on w/c safety and transfer safety as well as training on donning and doffing prosthesis.  Pt required a large amount of time with education on donning prosthetic liner and socket at mod to max A level.    Personal Factors and Comorbidities  Age;Comorbidity 3+;Fitness;Past/Current Experience;Time since onset of injury/illness/exacerbation;Other;Transportation   lives alone so limited ability to practice tasks that require assist or supervision   Comorbidities  R TFA, L4-5 decompression sg 2016, DM, HTN,    Examination-Activity Limitations  Bend;Caring for Others;Carry;Locomotion Level;Reach Overhead;Stairs;Stand;Toileting;Transfers    Examination-Participation Restrictions  Community Activity;Church;Meal Prep    Stability/Clinical Decision Making  Evolving/Moderate complexity    Rehab Potential  Good    PT Frequency  2x / week    PT  Duration  12 weeks    PT Treatment/Interventions  ADLs/Self Care Home Management;DME Instruction;Gait training;Stair training;Functional mobility training;Therapeutic activities;Therapeutic exercise;Balance training;Neuromuscular re-education;Patient/family education;Prosthetic Training;Vestibular    PT Next Visit Plan  review prosthetic care, HEP at sink for standing balance & stregth, standing balance & gait with RW    Consulted and Agree with Plan of Care  Patient       Patient will benefit from skilled therapeutic intervention in order to improve the following deficits and impairments:  Abnormal gait, Decreased activity tolerance, Decreased balance, Decreased endurance, Decreased knowledge of use of DME, Decreased mobility, Decreased range of motion, Decreased strength, Dizziness, Increased edema, Impaired flexibility, Postural dysfunction, Prosthetic Dependency, Pain, Obesity  Visit Diagnosis: 1. Other abnormalities of gait and mobility   2. Unsteadiness on feet   3. Abnormal posture   4. Muscle weakness (generalized)   5. Stiffness of right hip, not elsewhere classified        Problem List Patient Active Problem List   Diagnosis Date Noted  . PAD (peripheral artery disease) (HCC) 03/23/2018  . Back pain 09/09/2014  . HYPERLIPIDEMIA 12/18/2006  . RECTAL POLYPS 12/18/2006  . 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 SBO 08/01/2006  . GOITER NOS 03/11/2006  . PERIPHERAL NEUROPATHY 03/11/2006  . HYPERTENSION 03/11/2006  . OSTEOPENIA 03/11/2006  . FOOT DROP 03/11/2006    Osvaldo HumanKarissa Praneeth Bussey Anavictoria Wilk, PTA  07/22/18, 7:59 PM [facility] [address] [place], [name], [id number] Phone: [phone]   Fax:  [phone]  Name: Emma PeopleDeitra [name] MRN: [id number] Date of Birth: 17-Aug-1945

## 2018-07-23 DIAGNOSIS — I739 Peripheral vascular disease, unspecified: Secondary | ICD-10-CM | POA: Diagnosis not present

## 2018-07-23 DIAGNOSIS — Z89611 Acquired absence of right leg above knee: Secondary | ICD-10-CM | POA: Diagnosis not present

## 2018-07-23 DIAGNOSIS — E119 Type 2 diabetes mellitus without complications: Secondary | ICD-10-CM | POA: Diagnosis not present

## 2018-07-25 ENCOUNTER — Ambulatory Visit: Payer: Medicare HMO | Admitting: Physical Therapy

## 2018-07-29 ENCOUNTER — Other Ambulatory Visit: Payer: Self-pay

## 2018-07-29 ENCOUNTER — Ambulatory Visit: Payer: Medicare HMO | Admitting: Physical Therapy

## 2018-07-29 ENCOUNTER — Encounter: Payer: Self-pay | Admitting: Physical Therapy

## 2018-07-29 DIAGNOSIS — R2681 Unsteadiness on feet: Secondary | ICD-10-CM

## 2018-07-29 DIAGNOSIS — M6281 Muscle weakness (generalized): Secondary | ICD-10-CM

## 2018-07-29 DIAGNOSIS — M25651 Stiffness of right hip, not elsewhere classified: Secondary | ICD-10-CM | POA: Diagnosis not present

## 2018-07-29 DIAGNOSIS — R2689 Other abnormalities of gait and mobility: Secondary | ICD-10-CM | POA: Diagnosis not present

## 2018-07-29 DIAGNOSIS — R293 Abnormal posture: Secondary | ICD-10-CM | POA: Diagnosis not present

## 2018-07-29 NOTE — Therapy (Signed)
[facility] [address] [place], [place], [address] Phone: [phone]   Fax:  [phone]  Physical Therapy Treatment  Patient Details  Name: Emma [name] MRN: [id number] Date of Birth: 12/09/1945 Referring Provider (PT): Jackie PlumGeorge Osei-Bonsu, MD   Encounter Date: 07/29/2018  PT End of Session - 07/29/18 1904    Visit Number  3    Number of Visits  25    Date for PT Re-Evaluation  10/15/18    Authorization Type  [employer]MANA  Medicare    PT Start Time  1800    PT Stop Time  1845    PT Time Calculation (min)  45 min    Equipment Utilized During Treatment  Gait belt    Activity Tolerance  Patient tolerated treatment well;Patient limited by fatigue       Past Medical History:  Diagnosis Date  . Arthritis    Back and right shoulder  . Constipation   . Diabetes mellitus    Type II  . Diabetes mellitus without complication (HCC)   . Diabetic neuropathy (HCC)   . History of blood transfusion   . Hypercholesteremia   . Hypertension   . Hypothyroidism   . Pneumonia   . Thyroid disease     Past Surgical History:  Procedure Laterality Date  . ABDOMINAL AORTOGRAM N/A 03/25/2018   Procedure: ABDOMINAL AORTOGRAM;  Surgeon: Maeola Harmanain, Brandon Christopher, MD;  [facility];  Service: Cardiovascular;  Laterality: N/A;  . ABDOMINAL HYSTERECTOMY    . AMPUTATION Right 03/29/2018   Procedure: AMPUTATION ABOVE KNEE;  Surgeon: Nada LibmanBrabham, Vance W, MD;  [facility];  Service: Vascular;  Laterality: Right;  . APPENDECTOMY    . BACK SURGERY    . BYPASS GRAFT FEMORAL-PERONEAL Right 03/26/2018   Procedure: BYPASS GRAFT [place]ighborFEMORAL-PERONEAL;  Surgeon: Maeola Harmanain, Brandon Christopher, MD;  [facility];  Service: Vascular;  Laterality: Right;  . EXPLORATORY LAPAROTOMY     Adhesions removed and appendix  . FEMORAL-POPLITEAL BYPASS GRAFT Right 03/27/2018   Procedure: Right lower extremity angiogram, Right Femoral Peroneal artery  bypass, Angioplasty Right Femoral Peroneal bypass, Thrombectomy of Femoral peroneal artery;  Surgeon: Maeola Harmanain, Brandon Christopher, MD;  [facility];  Service: Vascular;  Laterality: Right;  . INTRAOPERATIVE ARTERIOGRAM Right 03/26/2018   Procedure: Intra Operative Arteriogram;  Surgeon: Maeola Harmanain, Brandon Christopher, MD;  [facility];  Service: Vascular;  Laterality: Right;  . LOWER EXTREMITY ANGIOGRAPHY Bilateral 03/25/2018   Procedure: LOWER EXTREMITY ANGIOGRAPHY;  Surgeon: Maeola Harmanain, Brandon Christopher, MD;  [facility];  Service: Cardiovascular;  Laterality: Bilateral;  . LUMBAR LAMINECTOMY/DECOMPRESSION MICRODISCECTOMY N/A 09/09/2014   Procedure: L4-L5 DECOMPRESSION ;  Surgeon: Venita Lickahari Brooks, MD;  Location: [facility];  Service: Orthopedics;  Laterality: N/A;  . PATCH ANGIOPLASTY Right 03/26/2018   Procedure: PATCH ANGIOPLASTY;  Surgeon: Maeola Harmanain, Brandon Christopher, MD;  [facility];  Service: Vascular;  Laterality: Right;  . SHOULDER SURGERY    . THYROID SURGERY    . TONSILLECTOMY    . TOTAL THYROIDECTOMY    . VEIN HARVEST Right 03/26/2018   Procedure: Vein Harvest of Right Leg Greater Saphenous Vein in SITU;  Surgeon: Maeola Harmanain, Brandon Christopher, MD;  [facility];  Service: Vascular;  Laterality: Right;    There were no vitals filed for this visit.  Subjective Assessment - 07/29/18 1803    Subjective  No falls since last visit.  Left shoulder was very painful and that was the reason she cancelled last visit.  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  No/denies    Pain Onset  More than a month ago    Pain Onset  More than a month ago                       [facility] Adult PT Treatment/Exercise - 07/29/18 0001      Transfers   Transfers  Sit to Stand;Stand to Sit    Sit to Stand  4: Min  guard    Sit to Stand Details (indicate cue type and reason)  stood up at sink    Stand to Sit  4: Min guard    Stand to Sit Details  sat down from sink. Cues for hand placement.     Ambulation/Gait   Gait Comments  Unsafe at this time due to socket not being tightened on pilon.      Prosthetics   Prosthetic Care Comments   Instructed pt not to wear liner all night but can increase to 5 hrs 2x/day and put shrinker on at night.    Current prosthetic wear tolerance (days/week)   liner only daily.    Current prosthetic wear tolerance (#hours/day)   8-10 hrs with 2 hr break mid day; sometimes has fallen asleep with it on.    Current prosthetic weight-bearing tolerance (hours/day)   Patient tolerated standing for 3 -5 minutes with partial weight on prosthesis with no limb pain but reported feelig like prosthesis was pinching at crotch area.     Residual limb condition   reports no skin issues.    Education Provided  Skin check;Proper Donning;Proper Doffing;Proper wear schedule/adjustment;Proper weight-bearing schedule/adjustment    Person(s) Educated  Patient    Education Method  Explanation;Demonstration;Tactile cues;Verbal cues    Education Method  Verbalized understanding;Returned demonstration;Tactile cues required;Needs further instruction;Verbal cues required    Donning Prosthesis  Moderate assist    Doffing Prosthesis  Minimal assist          Balance Exercises - 07/29/18 1902      Balance Exercises: Standing   Standing Eyes Opened  Wide ([place])   practised weight shiftng onto prosthesis   Stepping Strategy  Other reps (comment);Anterior;Posterior   practised stepping with LLE and weight shifting on right       PT Education - 07/29/18 1903    Education Details  Recommend pt make an appointment with prosthetist due to pilon being loose from socket.    Person(s) Educated  Patient    Methods  Explanation    Comprehension  Verbalized understanding       PT Short Term Goals -  07/18/18 1141      PT SHORT TERM GOAL #1   Title  Patient donnes prosthesis including tightening suspension strap in standing with supervision. (ALL STGs Target Date: 08/16/2018)    Time  4    Period  Weeks    Status  New    Target Date  08/16/18      PT SHORT TERM GOAL #2   Title  Patient tolerates wearing prosthesis & liner >8hrs total /day without skin issues.    Time  4    Period  Weeks    Status  New    Target Date  08/16/18  PT SHORT TERM GOAL #3   Title  Patient sit to/from stand chairs with armrests to RW with prosthesis safely with supervision.    Time  4    Period  Weeks    Status  New    Target Date  08/16/18      PT SHORT TERM GOAL #4   Title  Patient ambulates 5650' with RW & prosthesis with minA.    Time  4    Period  Weeks    Status  New    Target Date  08/16/18      PT SHORT TERM GOAL #5   Title  Standing balance with RW support reaches 2" anteriorly & to knee level with supervision.    Time  4    Period  Weeks    Status  New    Target Date  08/16/18        PT Long Term Goals - 07/18/18 1137      PT LONG TERM GOAL #1   Title  Patient demonstrates & verbalizes proper prosthetic care to enable safe use of prosthesis.  (All LTGs Target Date: 10/11/2018)    Time  12    Period  Weeks    Status  New    Target Date  10/11/18      PT LONG TERM GOAL #2   Title  Patient tolerates prosthesis wear >80% of awake hours to improve ability to function during her day.    Time  12    Period  Weeks    Status  New    Target Date  10/11/18      PT LONG TERM GOAL #3   Title  Patient standing balance with RW support & prosthesis: static no UE support 1 minute, dynamic RW support scans, reaches 7" anteriorly & manages clothes modified independent.    Time  12    Period  Weeks    Status  New    Target Date  10/11/18      PT LONG TERM GOAL #4   Title  Patient ambulates 200' with RW & prosthesis modifiied independent.    Time  12    Period  Weeks    Status  New     Target Date  10/11/18      PT LONG TERM GOAL #5   Title  Patient negotiates ramps & curbs with RW & prosthesis modified independent.    Time  12    Period  Weeks    Status  New    Target Date  10/11/18            Plan - 07/29/18 1905    Clinical Impression Statement  Pt is making progress with donning liner at home correctly and reports no skin issues with wear.  Pt progressed with donning and doffing prosthesis during session with less assist this session.  Standing balance training performed at sink working on weight shifting onto prosthesis, progressing with stepping with LLE, posture control, and activties with decreased UE support; pt required min guard.  Performed standing activties at sink and no gait for safety due to very loose attachment between pilon and socket; encouraged pt to make an appointment with prosthetist.    Personal Factors and Comorbidities  Age;Comorbidity 3+;Fitness;Past/Current Experience;Time since onset of injury/illness/exacerbation;Other;Transportation   lives alone so limited ability to practice tasks that require assist or supervision   Comorbidities  R TFA, L4-5 decompression sg 2016, DM, HTN,    Examination-Activity Limitations  Bend;Caring for Others;Carry;Locomotion Level;Reach Overhead;Stairs;Stand;Toileting;Transfers    Examination-Participation Restrictions  Community Activity;Church;Meal Prep    Stability/Clinical Decision Making  Evolving/Moderate complexity    Rehab Potential  Good    PT Frequency  2x / week    PT Duration  12 weeks    PT Treatment/Interventions  ADLs/Self Care Home Management;DME Instruction;Gait training;Stair training;Functional mobility training;Therapeutic activities;Therapeutic exercise;Balance training;Neuromuscular re-education;Patient/family education;Prosthetic Training;Vestibular    PT Next Visit Plan  review prosthetic care, HEP at sink for standing balance & stregth, standing balance & gait with RW    Consulted  and Agree with Plan of Care  Patient       Patient will benefit from skilled therapeutic intervention in order to improve the following deficits and impairments:  Abnormal gait, Decreased activity tolerance, Decreased balance, Decreased endurance, Decreased knowledge of use of DME, Decreased mobility, Decreased range of motion, Decreased strength, Dizziness, Increased edema, Impaired flexibility, Postural dysfunction, Prosthetic Dependency, Pain, Obesity  Visit Diagnosis: 1. Other abnormalities of gait and mobility   2. Unsteadiness on feet   3. Abnormal posture   4. Muscle weakness (generalized)        Problem List Patient Active Problem List   Diagnosis Date Noted  . PAD (peripheral artery disease) (HCC) 03/23/2018  . Back pain 09/09/2014  . HYPERLIPIDEMIA 12/18/2006  . RECTAL POLYPS 12/18/2006  . 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 SBO 08/01/2006  . GOITER NOS 03/11/2006  . PERIPHERAL NEUROPATHY 03/11/2006  . HYPERTENSION 03/11/2006  . OSTEOPENIA 03/11/2006  . FOOT DROP 03/11/2006   Hortencia ConradiKarissa Drago Hammonds, PTA  07/29/18, 7:13 PM [facility] [address] [place], [place], [address] Phone: [phone]   Fax:  [phone]  Name: Emma PeopleDeitra [name] MRN: [id number] Date of Birth: 1945/03/25

## 2018-08-05 ENCOUNTER — Ambulatory Visit: Payer: Medicare HMO | Admitting: Physical Therapy

## 2018-08-05 ENCOUNTER — Encounter: Payer: Self-pay | Admitting: Physical Therapy

## 2018-08-05 ENCOUNTER — Other Ambulatory Visit: Payer: Self-pay

## 2018-08-05 DIAGNOSIS — R293 Abnormal posture: Secondary | ICD-10-CM | POA: Diagnosis not present

## 2018-08-05 DIAGNOSIS — M6281 Muscle weakness (generalized): Secondary | ICD-10-CM | POA: Diagnosis not present

## 2018-08-05 DIAGNOSIS — R2681 Unsteadiness on feet: Secondary | ICD-10-CM

## 2018-08-05 DIAGNOSIS — R2689 Other abnormalities of gait and mobility: Secondary | ICD-10-CM

## 2018-08-05 DIAGNOSIS — M25651 Stiffness of right hip, not elsewhere classified: Secondary | ICD-10-CM | POA: Diagnosis not present

## 2018-08-05 NOTE — Therapy (Signed)
[facility] [address] [place], [place], [address] Phone: [phone]   Fax:  [phone]  Physical Therapy Treatment  Patient Details  Name: Emma PeopleDeitra Brunetti MRN: [id number] Date of Birth: 08-22-1945 Referring Provider (PT): Jackie PlumGeorge Osei-Bonsu, MD   Encounter Date: 08/05/2018   CLINIC OPERATION CHANGES: Outpatient Neuro Rehab is open at lower capacity following universal masking, social distancing, and patient screening.  The patient's COVID risk of complications score is 4.   PT End of Session - 08/05/18 1156    Visit Number  4    Number of Visits  25    Date for PT Re-Evaluation  10/15/18    Authorization Type  [employer]  Medicare    PT Start Time  0706    PT Stop Time  0755    PT Time Calculation (min)  49 min    Equipment Utilized During Treatment  Gait belt    Activity Tolerance  Patient tolerated treatment well;Patient limited by fatigue    Behavior During Therapy  WFL for tasks assessed/performed       Past Medical History:  Diagnosis Date  . Arthritis    Back and right shoulder  . Constipation   . Diabetes mellitus    Type II  . Diabetes mellitus without complication (HCC)   . Diabetic neuropathy (HCC)   . History of blood transfusion   . Hypercholesteremia   . Hypertension   . Hypothyroidism   . Pneumonia   . Thyroid disease     Past Surgical History:  Procedure Laterality Date  . ABDOMINAL AORTOGRAM N/A 03/25/2018   Procedure: ABDOMINAL AORTOGRAM;  Surgeon: Maeola Harmanain, Brandon Christopher, MD;  [facility];  Service: Cardiovascular;  Laterality: N/A;  . ABDOMINAL HYSTERECTOMY    . AMPUTATION Right 03/29/2018   Procedure: AMPUTATION ABOVE KNEE;  Surgeon: Nada LibmanBrabham, Vance W, MD;  [facility];  Service: Vascular;  Laterality: Right;  . APPENDECTOMY    . BACK SURGERY    . BYPASS GRAFT FEMORAL-PERONEAL Right 03/26/2018   Procedure: BYPASS GRAFT [place]ighbor[place]EMORAL-PERONEAL;  Surgeon: Maeola Harmanain,  Brandon Christopher, MD;  [facility];  Service: Vascular;  Laterality: Right;  . EXPLORATORY LAPAROTOMY     Adhesions removed and appendix  . FEMORAL-POPLITEAL BYPASS GRAFT Right 03/27/2018   Procedure: Right lower extremity angiogram, Right Femoral Peroneal artery bypass, Angioplasty Right Femoral Peroneal bypass, Thrombectomy of Femoral peroneal artery;  Surgeon: Maeola Harmanain, Brandon Christopher, MD;  [facility];  Service: Vascular;  Laterality: Right;  . INTRAOPERATIVE ARTERIOGRAM Right 03/26/2018   Procedure: Intra Operative Arteriogram;  Surgeon: Maeola Harmanain, Brandon Christopher, MD;  [facility];  Service: Vascular;  Laterality: Right;  . LOWER EXTREMITY ANGIOGRAPHY Bilateral 03/25/2018   Procedure: LOWER EXTREMITY ANGIOGRAPHY;  Surgeon: Maeola Harmanain, Brandon Christopher, MD;  [facility];  Service: Cardiovascular;  Laterality: Bilateral;  . LUMBAR LAMINECTOMY/DECOMPRESSION MICRODISCECTOMY N/A 09/09/2014   Procedure: L4-L5 DECOMPRESSION ;  Surgeon: Venita Lickahari Brooks, MD;  [facility];  Service: Orthopedics;  Laterality: N/A;  . PATCH ANGIOPLASTY Right 03/26/2018   Procedure: PATCH ANGIOPLASTY;  Surgeon: Maeola Harmanain, Brandon Christopher, MD;  [facility];  Service: Vascular;  Laterality: Right;  . SHOULDER SURGERY    . THYROID SURGERY    . TONSILLECTOMY    . TOTAL THYROIDECTOMY    . VEIN HARVEST Right 03/26/2018   Procedure: Vein Harvest of Right Leg Greater Saphenous Vein in SITU;  Surgeon: Maeola Harmanain, Brandon Christopher, MD;  [facility];  Service: Vascular;  Laterality:  Right;    There were no vitals filed for this visit.  Subjective Assessment - 08/05/18 0705    Subjective  She has been wearing liner 5hrs 2x/day as advised. Prosthetist fixed prosthesis on Friday.    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   No/denies    Pain Onset  More than a month ago    Pain Onset  More than a month ago                       [facility] Treatment/Exercise - 08/05/18 0705      Transfers   Transfers  Sit to Stand;Stand to Sit;Stand Pivot Transfers    Sit to Stand  4: Min assist;4: Min guard;With upper extremity assist;With armrests;From chair/3-in-1   to RW for stabilization   Sit to Stand Details  Visual cues for safe use of DME/AE;Visual cues/gestures for sequencing;Verbal cues for sequencing;Verbal cues for technique;Verbal cues for safe use of DME/AE    Sit to Stand Details (indicate cue type and reason)  multiple reps to improve prosthetic knee control    Stand to Sit  4: Min guard;With upper extremity assist;With armrests;To chair/3-in-1   from RW for stability   Stand to Sit Details (indicate cue type and reason)  Visual cues for safe use of DME/AE;Visual cues/gestures for sequencing;Verbal cues for sequencing;Verbal cues for technique;Verbal cues for safe use of DME/AE    Stand to Sit Details  multiple reps to improve prosthetic knee control    Stand Pivot Transfers  4: Min assist;With armrests   with RW & TFA prosthesis   Stand Pivot Transfer Details (indicate cue type and reason)  demo, tactile & verbal cues on technique, RW movement, LE movement & wt shifts.       Ambulation/Gait   Gait Comments  --      Neuro Re-ed    Neuro Re-ed Details   standing balance with RW support with TFA prosthesis: static with min guard/tactile cues for weight over feet, scanning rt/lt & up/down and managing pants with single UE support.       Prosthetics   Prosthetic Care Comments   Demo & verbal cues on donning liner with velcro strap proper orientation and tightening strap in standing.     Current prosthetic wear tolerance (days/week)   liner only daily.    Current prosthetic wear tolerance (#hours/day)   5hrs 2x/day    Current prosthetic weight-bearing tolerance (hours/day)   Patient  tolerated standing for 3 minutes with partial weight on prosthesis with no limb pain but reported feelig like preothesis was pinchung at crotch area.     Residual limb condition   no issues. Normal color & temperature    Education Provided  Skin check;Proper Donning;Proper Doffing;Proper wear schedule/adjustment;Proper weight-bearing schedule/adjustment;Correct ply sock adjustment    Person(s) Educated  Patient    Education Method  Explanation;Demonstration;Verbal cues;Tactile cues    Education Method  Verbalized understanding;Returned demonstration;Tactile cues required;Verbal cues required;Needs further instruction    Donning Prosthesis  Minimal assist    Doffing Prosthesis  Minimal assist               PT Short Term Goals - 07/18/18 1141      PT SHORT TERM GOAL #1  Title  Patient donnes prosthesis including tightening suspension strap in standing with supervision. (ALL STGs Target Date: 08/16/2018)    Time  4    Period  Weeks    Status  New    Target Date  08/16/18      PT SHORT TERM GOAL #2   Title  Patient tolerates wearing prosthesis & liner >8hrs total /day without skin issues.    Time  4    Period  Weeks    Status  New    Target Date  08/16/18      PT SHORT TERM GOAL #3   Title  Patient sit to/from stand chairs with armrests to RW with prosthesis safely with supervision.    Time  4    Period  Weeks    Status  New    Target Date  08/16/18      PT SHORT TERM GOAL #4   Title  Patient ambulates 72' with RW & prosthesis with minA.    Time  4    Period  Weeks    Status  New    Target Date  08/16/18      PT SHORT TERM GOAL #5   Title  Standing balance with RW support reaches 2" anteriorly & to knee level with supervision.    Time  4    Period  Weeks    Status  New    Target Date  08/16/18        PT Long Term Goals - 07/18/18 1137      PT LONG TERM GOAL #1   Title  Patient demonstrates & verbalizes proper prosthetic care to enable safe use of prosthesis.   (All LTGs Target Date: 10/11/2018)    Time  12    Period  Weeks    Status  New    Target Date  10/11/18      PT LONG TERM GOAL #2   Title  Patient tolerates prosthesis wear >80% of awake hours to improve ability to function during her day.    Time  12    Period  Weeks    Status  New    Target Date  10/11/18      PT LONG TERM GOAL #3   Title  Patient standing balance with RW support & prosthesis: static no UE support 1 minute, dynamic RW support scans, reaches 7" anteriorly & manages clothes modified independent.    Time  12    Period  Weeks    Status  New    Target Date  10/11/18      PT LONG TERM GOAL #4   Title  Patient ambulates 200' with RW & prosthesis modifiied independent.    Time  12    Period  Weeks    Status  New    Target Date  10/11/18      PT LONG TERM GOAL #5   Title  Patient negotiates ramps & curbs with RW & prosthesis modified independent.    Time  12    Period  Weeks    Status  New    Target Date  10/11/18            Plan - 08/05/18 1947    Clinical Impression Statement  Today's skilled session focused on improving transfers including sit/stand and stand-pivot with RW with TFA prosthesis control. Patient will probably not meet STGs due to limited PT appointments with patient transportation issues.    Personal Factors and Comorbidities  Age;Comorbidity 3+;Fitness;Past/Current Experience;Time since onset of injury/illness/exacerbation;Other;Transportation   lives alone so limited ability to practice tasks that require assist or supervision   Comorbidities  R TFA, L4-5 decompression sg 2016, DM, HTN,    Examination-Activity Limitations  Bend;Caring for Others;Carry;Locomotion Level;Reach Overhead;Stairs;Stand;Toileting;Transfers    Examination-Participation Restrictions  Community Activity;Church;Meal Prep    Stability/Clinical Decision Making  Evolving/Moderate complexity    Rehab Potential  Good    PT Frequency  2x / week    PT Duration  12 weeks     PT Treatment/Interventions  ADLs/Self Care Home Management;DME Instruction;Gait training;Stair training;Functional mobility training;Therapeutic activities;Therapeutic exercise;Balance training;Neuromuscular re-education;Patient/family education;Prosthetic Training;Vestibular    PT Next Visit Plan  review prosthetic care, HEP at sink for standing balance & stregth, standing balance & gait with RW    Consulted and Agree with Plan of Care  Patient       Patient will benefit from skilled therapeutic intervention in order to improve the following deficits and impairments:  Abnormal gait, Decreased activity tolerance, Decreased balance, Decreased endurance, Decreased knowledge of use of DME, Decreased mobility, Decreased range of motion, Decreased strength, Dizziness, Increased edema, Impaired flexibility, Postural dysfunction, Prosthetic Dependency, Pain, Obesity  Visit Diagnosis: 1. Other abnormalities of gait and mobility   2. Unsteadiness on feet   3. Abnormal posture   4. Muscle weakness (generalized)   5. Stiffness of right hip, not elsewhere classified        Problem List Patient Active Problem List   Diagnosis Date Noted  . PAD (peripheral artery disease) (HCC) 03/23/2018  . Back pain 09/09/2014  . HYPERLIPIDEMIA 12/18/2006  . RECTAL POLYPS 12/18/2006  . 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 SBO 08/01/2006  . GOITER NOS 03/11/2006  . PERIPHERAL NEUROPATHY 03/11/2006  . HYPERTENSION 03/11/2006  . OSTEOPENIA 03/11/2006  . FOOT DROP 03/11/2006    Vladimir FasterWALDRON,Ahonesty Woodfin PT, DPT 08/05/2018, 7:48 PM  [facility] [address] [place], [place], [address] Phone: [phone]   Fax:  [phone]  Name: Emma PeopleDeitra [name] MRN: [id number] Date of Birth: 17-Oct-1945

## 2018-08-12 ENCOUNTER — Encounter: Payer: Self-pay | Admitting: Physical Therapy

## 2018-08-12 ENCOUNTER — Ambulatory Visit: Payer: Medicare HMO | Admitting: Physical Therapy

## 2018-08-12 ENCOUNTER — Other Ambulatory Visit: Payer: Self-pay

## 2018-08-12 DIAGNOSIS — R2689 Other abnormalities of gait and mobility: Secondary | ICD-10-CM | POA: Diagnosis not present

## 2018-08-12 DIAGNOSIS — R2681 Unsteadiness on feet: Secondary | ICD-10-CM

## 2018-08-12 DIAGNOSIS — M25651 Stiffness of right hip, not elsewhere classified: Secondary | ICD-10-CM | POA: Diagnosis not present

## 2018-08-12 DIAGNOSIS — M6281 Muscle weakness (generalized): Secondary | ICD-10-CM

## 2018-08-12 DIAGNOSIS — R293 Abnormal posture: Secondary | ICD-10-CM | POA: Diagnosis not present

## 2018-08-12 NOTE — Therapy (Addendum)
[facility] [address] [place], [place], [address] Phone: [phone]   Fax:  [phone]  Physical Therapy Treatment  Patient Details  Name: Emma [name] MRN: [id number] Date of Birth: [birth year] Referring Provider (PT): Benito Mccreedy, MD   Encounter Date: 08/12/2018  PT End of Session - 08/12/18 1858    Visit Number  5    Number of Visits  25    Date for PT Re-Evaluation  10/15/18    Authorization Type  HUMANA  Medicare    PT Start Time  1803    PT Stop Time  1843    PT Time Calculation (min)  40 min    Equipment Utilized During Treatment  Gait belt    Activity Tolerance  Patient tolerated treatment well;Patient limited by fatigue    Behavior During Therapy  [facility] for tasks assessed/performed       Past Medical History:  Diagnosis Date  . Arthritis    Back and right shoulder  . Constipation   . Diabetes mellitus    Type II  . Diabetes mellitus without complication ([place])   . Diabetic neuropathy ([place])   . History of blood transfusion   . Hypercholesteremia   . Hypertension   . Hypothyroidism   . Pneumonia   . Thyroid disease     Past Surgical History:  Procedure Laterality Date  . ABDOMINAL AORTOGRAM N/A 03/25/2018   Procedure: ABDOMINAL AORTOGRAM;  Surgeon: Waynetta Sandy, MD;  Location: [facility];  Service: Cardiovascular;  Laterality: N/A;  . ABDOMINAL HYSTERECTOMY    . AMPUTATION Right 03/29/2018   Procedure: AMPUTATION ABOVE KNEE;  Surgeon: Serafina Mitchell, MD;  Location: [facility];  Service: Vascular;  Laterality: Right;  . APPENDECTOMY    . BACK SURGERY    . BYPASS GRAFT FEMORAL-PERONEAL Right 03/26/2018   Procedure: BYPASS GRAFT [place];  Surgeon: Waynetta Sandy, MD;  Location: [facility];  Service: Vascular;  Laterality: Right;  . EXPLORATORY LAPAROTOMY     Adhesions removed and appendix  . FEMORAL-POPLITEAL BYPASS GRAFT Right 03/27/2018   Procedure:  Right lower extremity angiogram, Right Femoral Peroneal artery bypass, Angioplasty Right Femoral Peroneal bypass, Thrombectomy of Femoral peroneal artery;  Surgeon: Waynetta Sandy, MD;  Location: [facility];  Service: Vascular;  Laterality: Right;  . INTRAOPERATIVE ARTERIOGRAM Right 03/26/2018   Procedure: Intra Operative Arteriogram;  Surgeon: Waynetta Sandy, MD;  Location: [facility];  Service: Vascular;  Laterality: Right;  . LOWER EXTREMITY ANGIOGRAPHY Bilateral 03/25/2018   Procedure: LOWER EXTREMITY ANGIOGRAPHY;  Surgeon: Waynetta Sandy, MD;  Location: [facility];  Service: Cardiovascular;  Laterality: Bilateral;  . LUMBAR LAMINECTOMY/DECOMPRESSION MICRODISCECTOMY N/A 09/09/2014   Procedure: L4-L5 DECOMPRESSION ;  Surgeon: Melina Schools, MD;  Location: [facility];  Service: Orthopedics;  Laterality: N/A;  . PATCH ANGIOPLASTY Right 03/26/2018   Procedure: PATCH ANGIOPLASTY;  Surgeon: Waynetta Sandy, MD;  Location: [facility];  Service: Vascular;  Laterality: Right;  . SHOULDER SURGERY    . THYROID SURGERY    . TONSILLECTOMY    . TOTAL THYROIDECTOMY    . VEIN HARVEST Right 03/26/2018   Procedure: Vein Harvest of Right Leg Greater Saphenous Vein in SITU;  Surgeon: Waynetta Sandy, MD;  Location: [facility];  Service: Vascular;  Laterality: Right;    There were no vitals filed for this visit.  Subjective Assessment - 08/12/18 1804    Subjective  Nothing new to report.    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  No/denies    Pain Onset  More than a month ago    Pain Onset  More than a month ago                       [facility] Adult PT Treatment/Exercise - 08/12/18 0001      Transfers   Transfers  Sit to Stand;Stand to Sit;Stand Pivot Transfers    Sit to Stand  4: Min guard     Sit to Stand Details  Visual cues for safe use of DME/AE;Visual cues/gestures for sequencing;Verbal cues for sequencing;Verbal cues for technique;Verbal cues for safe use of DME/AE    Sit to Stand Details (indicate cue type and reason)  multiple reps to improve prosthetic knee control    Stand to Sit  4: Min guard    Stand to Sit Details (indicate cue type and reason)  Visual cues for safe use of DME/AE;Visual cues/gestures for sequencing;Verbal cues for sequencing;Verbal cues for technique;Verbal cues for safe use of DME/AE    Stand to Sit Details  multiple reps to improve prosthetic knee control    Stand Pivot Transfers  4: Min assist;With armrests    Stand Pivot Transfer Details (indicate cue type and reason)  demo, tactile & verbal cues on technique, RW movement, LE movement & wt shifts.       Ambulation/Gait   Ambulation/Gait  Yes    Ambulation/Gait Assistance  3: Mod assist    Ambulation/Gait Assistance Details  demo, instructed in proper prosthetic control & sequencing prior to ambulating.  PTA manually assisting prosthetic knee control.     Ambulation Distance (Feet)  28 Feet    Assistive device  Rolling walker;Prosthesis    Gait Pattern  Step-to pattern;Decreased step length - left;Decreased stance time - right;Decreased stride length;Decreased hip/knee flexion - right;Decreased weight shift to right;Right circumduction;Right hip hike;Antalgic;Lateral hip instability;Trunk flexed;Abducted- right;Poor foot clearance - right    Ambulation Surface  Level;Indoor      Prosthetics   Prosthetic Care Comments   Demo & verbal cues on donning liner with velcro strap proper orientation and tightening strap in standing.     Current prosthetic wear tolerance (days/week)   liner only daily.    Current prosthetic wear tolerance (#hours/day)   5hrs 2x/day    Current prosthetic weight-bearing tolerance (hours/day)   reported no pinching today. Reports some lower residual tib pain after gait.     Residual limb condition   no issues per pt.    Education Provided  Skin check;Proper Donning;Proper Doffing;Proper wear schedule/adjustment;Proper weight-bearing schedule/adjustment;Correct ply sock adjustment    Person(s) Educated  Patient    Education Method  Explanation;Demonstration;Tactile cues;Verbal cues    Education Method  Verbalized understanding;Returned demonstration;Tactile cues required;Verbal cues required;Needs further instruction    Donning Prosthesis  Minimal assist    Doffing Prosthesis  Supervision             PT Education - 08/12/18 1858    Education Details  Discussed goals checked.    Person(s) Educated  Patient    Methods  Explanation    Comprehension  Verbalized understanding       PT Short Term Goals - 08/12/18 1859      PT SHORT TERM GOAL #1  Title  Patient donnes prosthesis including tightening suspension strap in standing with supervision. (ALL STGs Target Date: 08/16/2018)    Baseline  08/12/18; pt is at min A.    Time  4    Period  Weeks    Status  Not Met    Target Date  08/16/18      PT SHORT TERM GOAL #2   Title  Patient tolerates wearing prosthesis & liner >8hrs total /day without skin issues.    Baseline  08/12/18; Pt wears liner only due to safety. Pt lives alone and currently needs min A with donning prosthesis.    Time  4    Period  Weeks    Status  Not Met    Target Date  08/16/18      PT SHORT TERM GOAL #3   Title  Patient sit to/from stand chairs with armrests to RW with prosthesis safely with supervision.    Baseline  08/12/18; pt is at min A with mod cues for sequence.    Time  4    Period  Weeks    Status  Not Met    Target Date  08/16/18      PT SHORT TERM GOAL #4   Title  Patient ambulates 5' with RW & prosthesis with minA.    Baseline  08/12/18; pt ambulates 28 ft with RW and prosthesis at mod A.    Time  4    Period  Weeks    Status  Not Met    Target Date  08/16/18      PT SHORT TERM GOAL #5   Title  Standing  balance with RW support reaches 2" anteriorly & to knee level with supervision.    Baseline  08/12/18; Pt requires min guard to min A for standing balance with RW.    Time  4    Period  Weeks    Status  Not Met    Target Date  08/16/18        PT Long Term Goals - 07/18/18 1137      PT LONG TERM GOAL #1   Title  Patient demonstrates & verbalizes proper prosthetic care to enable safe use of prosthesis.  (All LTGs Target Date: 10/11/2018)    Time  12    Period  Weeks    Status  New    Target Date  10/11/18      PT LONG TERM GOAL #2   Title  Patient tolerates prosthesis wear >80% of awake hours to improve ability to function during her day.    Time  12    Period  Weeks    Status  New    Target Date  10/11/18      PT LONG TERM GOAL #3   Title  Patient standing balance with RW support & prosthesis: static no UE support 1 minute, dynamic RW support scans, reaches 7" anteriorly & manages clothes modified independent.    Time  12    Period  Weeks    Status  New    Target Date  10/11/18      PT LONG TERM GOAL #4   Title  Patient ambulates 200' with RW & prosthesis modifiied independent.    Time  12    Period  Weeks    Status  New    Target Date  10/11/18      PT LONG TERM GOAL #5   Title  Patient negotiates ramps & curbs with  RW & prosthesis modified independent.    Time  12    Period  Weeks    Status  New    Target Date  10/11/18            Plan - 08/12/18 1903    Clinical Impression Statement  Skilled session focused on checking STGs.  Pt did not meet any STGs but made progress with each goal from eval.  Pt's progress has been gradual although slow; pt has had to miss multiple appointments due to lack of transportation and lives alone so she does not have the benefit of practising mobility skills at home currently due to safety concerns.    Personal Factors and Comorbidities  Age;Comorbidity 3+;Fitness;Past/Current Experience;Time since onset of  injury/illness/exacerbation;Other;Transportation   lives alone so limited ability to practice tasks that require assist or supervision   Comorbidities  R TFA, L4-5 decompression sg 2016, DM, HTN,    Examination-Activity Limitations  Bend;Caring for Others;Carry;Locomotion Level;Reach Overhead;Stairs;Stand;Toileting;Transfers    Examination-Participation Restrictions  Community Activity;Church;Meal Prep    Stability/Clinical Decision Making  Evolving/Moderate complexity    Rehab Potential  Good    PT Frequency  2x / week    PT Duration  12 weeks    PT Treatment/Interventions  ADLs/Self Care Home Management;DME Instruction;Gait training;Stair training;Functional mobility training;Therapeutic activities;Therapeutic exercise;Balance training;Neuromuscular re-education;Patient/family education;Prosthetic Training;Vestibular    PT Next Visit Plan  review prosthetic care, HEP at sink for standing balance & stregth, standing balance & gait with RW    Consulted and Agree with Plan of Care  Patient       Patient will benefit from skilled therapeutic intervention in order to improve the following deficits and impairments:  Abnormal gait, Decreased activity tolerance, Decreased balance, Decreased endurance, Decreased knowledge of use of DME, Decreased mobility, Decreased range of motion, Decreased strength, Dizziness, Increased edema, Impaired flexibility, Postural dysfunction, Prosthetic Dependency, Pain, Obesity  Visit Diagnosis: 1. Other abnormalities of gait and mobility   2. Unsteadiness on feet   3. Abnormal posture   4. Muscle weakness (generalized)        Problem List Patient Active Problem List   Diagnosis Date Noted  . PAD (peripheral artery disease) ([facility]) 03/23/2018  . Back pain 09/09/2014  . HYPERLIPIDEMIA 12/18/2006  . RECTAL POLYPS 12/18/2006  . 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 SBO 08/01/2006  . GOITER NOS 03/11/2006  . PERIPHERAL NEUROPATHY 03/11/2006  . HYPERTENSION 03/11/2006  .  OSTEOPENIA 03/11/2006  . FOOT DROP 03/11/2006    Bjorn Loser, PTA  08/12/18, 7:09 PM [facility] [address] [place], [place], [address] Phone: [phone]   Fax:  [phone]  Name: Maecyn Panning MRN: [id number] Date of Birth: 1945-06-15  PT Short Term Goals - 08/16/18 0012      PT SHORT TERM GOAL #1   Title  Patient donnes prosthesis including tightening suspension strap in standing with supervision. (ALL STGs updated Target Date: 09/13/2018)    Time  4    Period  Weeks    Status  On-going    Target Date  09/13/18      PT SHORT TERM GOAL #2   Title  Patient tolerates wearing prosthesis & liner >8hrs total /day without skin issues.    Time  4    Period  Weeks    Status  On-going    Target Date  09/13/18      PT SHORT TERM GOAL #3   Title  Patient sit to/from stand chairs with  armrests to RW with prosthesis safely with supervision.    Time  4    Period  Weeks    Status  On-going    Target Date  09/13/18      PT SHORT TERM GOAL #4   Title  Patient ambulates 72' with RW & prosthesis with minA.    Time  4    Period  Weeks    Status  On-going    Target Date  09/13/18      PT SHORT TERM GOAL #5   Title  Standing balance with RW support reaches 2" anteriorly & to knee level with supervision.    Time  4    Period  Weeks    Status  On-going    Target Date  09/13/18      Jamey Reas, PT, DPT PT Specializing in [place] 08/16/18 12:15 AM Phone:  [phone]  Fax:  [phone] [facility] [address] [place], [place] [address]

## 2018-08-14 ENCOUNTER — Ambulatory Visit: Payer: Medicare HMO | Admitting: Physical Therapy

## 2018-08-19 ENCOUNTER — Ambulatory Visit: Payer: Medicare HMO | Attending: Internal Medicine | Admitting: Physical Therapy

## 2018-08-19 ENCOUNTER — Other Ambulatory Visit: Payer: Self-pay

## 2018-08-19 ENCOUNTER — Encounter: Payer: Self-pay | Admitting: Physical Therapy

## 2018-08-19 DIAGNOSIS — R293 Abnormal posture: Secondary | ICD-10-CM | POA: Insufficient documentation

## 2018-08-19 DIAGNOSIS — M6281 Muscle weakness (generalized): Secondary | ICD-10-CM | POA: Insufficient documentation

## 2018-08-19 DIAGNOSIS — R2681 Unsteadiness on feet: Secondary | ICD-10-CM | POA: Diagnosis not present

## 2018-08-19 DIAGNOSIS — R2689 Other abnormalities of gait and mobility: Secondary | ICD-10-CM

## 2018-08-19 DIAGNOSIS — M25651 Stiffness of right hip, not elsewhere classified: Secondary | ICD-10-CM | POA: Diagnosis not present

## 2018-08-20 NOTE — Therapy (Signed)
[facility] [address] [place], [place], [address] Phone: [phone]   Fax:  [phone]  Physical Therapy Treatment  Patient Details  Name: Emma PeopleDeitra Braver MRN: [id number] Date of Birth: January 12, 1946 Referring Provider (PT): Jackie PlumGeorge Osei-Bonsu, MD   Encounter Date: 08/19/2018  PT End of Session - 08/19/18 1707    Visit Number  6    Number of Visits  25    Date for PT Re-Evaluation  10/15/18    Authorization Type  HUMANA  Medicare    PT Start Time  1705    PT Stop Time  1745    PT Time Calculation (min)  40 min    Equipment Utilized During Treatment  Gait belt    Activity Tolerance  Patient tolerated treatment well;Patient limited by fatigue    Behavior During Therapy  [facility] for tasks assessed/performed       Past Medical History:  Diagnosis Date  . Arthritis    Back and right shoulder  . Constipation   . Diabetes mellitus    Type II  . Diabetes mellitus without complication ([facility])   . Diabetic neuropathy ([facility])   . History of blood transfusion   . Hypercholesteremia   . Hypertension   . Hypothyroidism   . Pneumonia   . Thyroid disease     Past Surgical History:  Procedure Laterality Date  . ABDOMINAL AORTOGRAM N/A 03/25/2018   Procedure: ABDOMINAL AORTOGRAM;  Surgeon: Maeola Harmanain, Brandon Christopher, MD;  [facility];  Service: Cardiovascular;  Laterality: N/A;  . ABDOMINAL HYSTERECTOMY    . AMPUTATION Right 03/29/2018   Procedure: AMPUTATION ABOVE KNEE;  Surgeon: Nada LibmanBrabham, Vance W, MD;  [facility];  Service: Vascular;  Laterality: Right;  . APPENDECTOMY    . BACK SURGERY    . BYPASS GRAFT FEMORAL-PERONEAL Right 03/26/2018   Procedure: BYPASS GRAFT [place]ighbor[place]EMORAL-PERONEAL;  Surgeon: Maeola Harmanain, Brandon Christopher, MD;  [facility];  Service: Vascular;  Laterality: Right;  . EXPLORATORY LAPAROTOMY     Adhesions removed and appendix  . FEMORAL-POPLITEAL BYPASS GRAFT Right 03/27/2018   Procedure:  Right lower extremity angiogram, Right Femoral Peroneal artery bypass, Angioplasty Right Femoral Peroneal bypass, Thrombectomy of Femoral peroneal artery;  Surgeon: Maeola Harmanain, Brandon Christopher, MD;  [facility];  Service: Vascular;  Laterality: Right;  . INTRAOPERATIVE ARTERIOGRAM Right 03/26/2018   Procedure: Intra Operative Arteriogram;  Surgeon: Maeola Harmanain, Brandon Christopher, MD;  [facility];  Service: Vascular;  Laterality: Right;  . LOWER EXTREMITY ANGIOGRAPHY Bilateral 03/25/2018   Procedure: LOWER EXTREMITY ANGIOGRAPHY;  Surgeon: Maeola Harmanain, Brandon Christopher, MD;  [facility];  Service: Cardiovascular;  Laterality: Bilateral;  . LUMBAR LAMINECTOMY/DECOMPRESSION MICRODISCECTOMY N/A 09/09/2014   Procedure: L4-L5 DECOMPRESSION ;  Surgeon: Venita Lickahari Brooks, MD;  [facility];  Service: Orthopedics;  Laterality: N/A;  . PATCH ANGIOPLASTY Right 03/26/2018   Procedure: PATCH ANGIOPLASTY;  Surgeon: Maeola Harmanain, Brandon Christopher, MD;  [facility];  Service: Vascular;  Laterality: Right;  . SHOULDER SURGERY    . THYROID SURGERY    . TONSILLECTOMY    . TOTAL THYROIDECTOMY    . VEIN HARVEST Right 03/26/2018   Procedure: Vein Harvest of Right Leg Greater Saphenous Vein in SITU;  Surgeon: Maeola Harmanain, Brandon Christopher, MD;  [facility];  Service: Vascular;  Laterality: Right;    There were no vitals filed for this visit.  Subjective Assessment - 08/19/18 1706    Subjective  No new complaints. No falls or pain to report.  Dropped off by daugher.    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  No/denies    Pain Score  0-No pain              [facility] Adult PT Treatment/Exercise - 08/19/18 1708      Transfers   Transfers  Sit to Stand;Stand to Sit    Sit to Stand  4: Min guard;With upper extremity assist;From  chair/3-in-1    Sit to Stand Details  Verbal cues for sequencing;Verbal cues for technique;Verbal cues for safe use of DME/AE;Manual facilitation for weight bearing  (Pended)     Sit to Stand Details (indicate cue type and reason)  from wheelchair to RW x2 with min guard assist, to sink with min assist with cues on prosthetic placement.   (Pended)     Stand to Sit  4: Min guard;With upper extremity assist;To chair/3-in-1    Stand to Sit Details (indicate cue type and reason)  Verbal cues for technique;Verbal cues for sequencing;Verbal cues for safe use of DME/AE;Manual facilitation for weight shifting  (Pended)       Ambulation/Gait   Ambulation/Gait  Yes    Ambulation/Gait Assistance  4: Min guard;4: Min assist  (Pended)     Ambulation/Gait Assistance Details  multimodal cues needed for sequencing, posture, step length and weight shifting over the prosthesis in stance phase.   (Pended)     Ambulation Distance (Feet)  30 Feet  (Pended)    x2 reps   Assistive device  Rolling walker;Prosthesis    Gait Pattern  Step-to pattern;Decreased step length - left;Decreased stance time - right;Decreased stride length;Decreased hip/knee flexion - right;Decreased weight shift to right;Right circumduction;Right hip hike;Antalgic;Lateral hip instability;Trunk flexed;Abducted- right;Poor foot clearance - right    Ambulation Surface  Level;Indoor      Prosthetics   Current prosthetic wear tolerance (days/week)   liner only daily.    Current prosthetic wear tolerance (#hours/day)   5hrs 2x/day    Residual limb condition   intact per pt report  (Pended)     Donning Prosthesis  Minimal assist    Doffing Prosthesis  Supervision               PT Short Term Goals - 08/16/18 0012      PT SHORT TERM GOAL #1   Title  Patient donnes prosthesis including tightening suspension strap in standing with supervision. (ALL STGs updated Target Date: 09/13/2018)    Time  4    Period  Weeks    Status  On-going     Target Date  09/13/18      PT SHORT TERM GOAL #2   Title  Patient tolerates wearing prosthesis & liner >8hrs total /day without skin issues.    Time  4    Period  Weeks    Status  On-going    Target Date  09/13/18      PT SHORT TERM GOAL #3   Title  Patient sit to/from stand chairs with armrests to RW with prosthesis safely with supervision.    Time  4    Period  Weeks    Status  On-going    Target Date  09/13/18      PT SHORT TERM GOAL #4   Title  Patient ambulates 1250' with  RW & prosthesis with minA.    Time  4    Period  Weeks    Status  On-going    Target Date  09/13/18      PT SHORT TERM GOAL #5   Title  Standing balance with RW support reaches 2" anteriorly & to knee level with supervision.    Time  4    Period  Weeks    Status  On-going    Target Date  09/13/18        PT Long Term Goals - 07/18/18 1137      PT LONG TERM GOAL #1   Title  Patient demonstrates & verbalizes proper prosthetic care to enable safe use of prosthesis.  (All LTGs Target Date: 10/11/2018)    Time  12    Period  Weeks    Status  New    Target Date  10/11/18      PT LONG TERM GOAL #2   Title  Patient tolerates prosthesis wear >80% of awake hours to improve ability to function during her day.    Time  12    Period  Weeks    Status  New    Target Date  10/11/18      PT LONG TERM GOAL #3   Title  Patient standing balance with RW support & prosthesis: static no UE support 1 minute, dynamic RW support scans, reaches 7" anteriorly & manages clothes modified independent.    Time  12    Period  Weeks    Status  New    Target Date  10/11/18      PT LONG TERM GOAL #4   Title  Patient ambulates 200' with RW & prosthesis modifiied independent.    Time  12    Period  Weeks    Status  New    Target Date  10/11/18      PT LONG TERM GOAL #5   Title  Patient negotiates ramps & curbs with RW & prosthesis modified independent.    Time  12    Period  Weeks    Status  New    Target Date   10/11/18            Plan - 08/19/18 1707    Clinical Impression Statement  Today's skilled session focused on prosthetic education, transfers/gait with prosthesis/RW. The pt is progressing with less overall assistance needed. The pt is progressing toward goals and should benefit from continued PT to progress toward unmet goals. Discussed with pt having her daughter coming into a session to see how to assist her at home for a couple times a week to progress outside of therapy. The pt is to talk to her daughter about this.    Personal Factors and Comorbidities  Age;Comorbidity 3+;Fitness;Past/Current Experience;Time since onset of injury/illness/exacerbation;Other;Transportation   lives alone so limited ability to practice tasks that require assist or supervision   Comorbidities  R TFA, L4-5 decompression sg 2016, DM, HTN,    Examination-Activity Limitations  Bend;Caring for Others;Carry;Locomotion Level;Reach Overhead;Stairs;Stand;Toileting;Transfers    Examination-Participation Restrictions  Community Activity;Church;Meal Prep    Stability/Clinical Decision Making  Evolving/Moderate complexity    Rehab Potential  Good    PT Frequency  2x / week    PT Duration  12 weeks    PT Treatment/Interventions  ADLs/Self Care Home Management;DME Instruction;Gait training;Stair training;Functional mobility training;Therapeutic activities;Therapeutic exercise;Balance training;Neuromuscular re-education;Patient/family education;Prosthetic Training;Vestibular    PT Next Visit Plan  review prosthetic care, gait with  RW/prosthesis, continue to work on transfers at sink for pt to be independent with this at home    Consulted and Agree with Plan of Care  Patient       Patient will benefit from skilled therapeutic intervention in order to improve the following deficits and impairments:  Abnormal gait, Decreased activity tolerance, Decreased balance, Decreased endurance, Decreased knowledge of use of DME,  Decreased mobility, Decreased range of motion, Decreased strength, Dizziness, Increased edema, Impaired flexibility, Postural dysfunction, Prosthetic Dependency, Pain, Obesity  Visit Diagnosis: 1. Other abnormalities of gait and mobility   2. Unsteadiness on feet   3. Abnormal posture   4. Muscle weakness (generalized)        Problem List Patient Active Problem List   Diagnosis Date Noted  . PAD (peripheral artery disease) ([place]) 03/23/2018  . Back pain 09/09/2014  . HYPERLIPIDEMIA 12/18/2006  . RECTAL POLYPS 12/18/2006  . 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 SBO 08/01/2006  . GOITER NOS 03/11/2006  . PERIPHERAL NEUROPATHY 03/11/2006  . HYPERTENSION 03/11/2006  . OSTEOPENIA 03/11/2006  . FOOT DROP 03/11/2006    Willow Ora, PTA, [facility] [address] [place], [place] [address] [phone] 08/20/18, 9:07 PM   Name: Leonela Kivi MRN: [id number] Date of Birth: [birth year]

## 2018-08-23 DIAGNOSIS — Z89611 Acquired absence of right leg above knee: Secondary | ICD-10-CM | POA: Diagnosis not present

## 2018-08-23 DIAGNOSIS — I739 Peripheral vascular disease, unspecified: Secondary | ICD-10-CM | POA: Diagnosis not present

## 2018-08-23 DIAGNOSIS — E119 Type 2 diabetes mellitus without complications: Secondary | ICD-10-CM | POA: Diagnosis not present

## 2018-08-28 ENCOUNTER — Ambulatory Visit: Payer: Medicare HMO | Admitting: Physical Therapy

## 2018-09-04 ENCOUNTER — Other Ambulatory Visit: Payer: Self-pay

## 2018-09-04 ENCOUNTER — Ambulatory Visit: Payer: Medicare HMO | Admitting: Physical Therapy

## 2018-09-04 ENCOUNTER — Encounter: Payer: Self-pay | Admitting: Physical Therapy

## 2018-09-04 DIAGNOSIS — R293 Abnormal posture: Secondary | ICD-10-CM | POA: Diagnosis not present

## 2018-09-04 DIAGNOSIS — R2681 Unsteadiness on feet: Secondary | ICD-10-CM

## 2018-09-04 DIAGNOSIS — M25651 Stiffness of right hip, not elsewhere classified: Secondary | ICD-10-CM

## 2018-09-04 DIAGNOSIS — R2689 Other abnormalities of gait and mobility: Secondary | ICD-10-CM | POA: Diagnosis not present

## 2018-09-04 DIAGNOSIS — M6281 Muscle weakness (generalized): Secondary | ICD-10-CM

## 2018-09-05 NOTE — Therapy (Signed)
[facility] [address] [place], [place], [address] Phone: [phone]   Fax:  [phone]  Physical Therapy Treatment  Patient Details  Name: Emma [name] MRN: [id number] Date of Birth: [birth year] Referring Provider (PT): Jackie PlumGeorge Osei-Bonsu, MD   Encounter Date: 09/04/2018   CLINIC OPERATION CHANGES: Outpatient Neuro Rehab is open at lower capacity following universal masking, social distancing, and patient screening.  The patient's COVID risk of complications score is 4.   PT End of Session - 09/04/18 1846    Visit Number  7    Number of Visits  25    Date for PT Re-Evaluation  10/15/18    Authorization Type  HUMANA  Medicare    PT Start Time  1708    PT Stop Time  1747    PT Time Calculation (min)  39 min    Equipment Utilized During Treatment  Gait belt    Activity Tolerance  Patient tolerated treatment well;Patient limited by fatigue    Behavior During Therapy  WFL for tasks assessed/performed       Past Medical History:  Diagnosis Date  . Arthritis    Back and right shoulder  . Constipation   . Diabetes mellitus    Type II  . Diabetes mellitus without complication (HCC)   . Diabetic neuropathy (HCC)   . History of blood transfusion   . Hypercholesteremia   . Hypertension   . Hypothyroidism   . Pneumonia   . Thyroid disease     Past Surgical History:  Procedure Laterality Date  . ABDOMINAL AORTOGRAM N/A 03/25/2018   Procedure: ABDOMINAL AORTOGRAM;  Surgeon: Maeola Harmanain, Brandon Christopher, MD;  [facility];  Service: Cardiovascular;  Laterality: N/A;  . ABDOMINAL HYSTERECTOMY    . AMPUTATION Right 03/29/2018   Procedure: AMPUTATION ABOVE KNEE;  Surgeon: Nada LibmanBrabham, Vance W, MD;  [facility];  Service: Vascular;  Laterality: Right;  . APPENDECTOMY    . BACK SURGERY    . BYPASS GRAFT FEMORAL-PERONEAL Right 03/26/2018   Procedure: BYPASS GRAFT [place]ighbor[place]EMORAL-PERONEAL;  Surgeon: Maeola Harmanain,  Brandon Christopher, MD;  [facility];  Service: Vascular;  Laterality: Right;  . EXPLORATORY LAPAROTOMY     Adhesions removed and appendix  . FEMORAL-POPLITEAL BYPASS GRAFT Right 03/27/2018   Procedure: Right lower extremity angiogram, Right Femoral Peroneal artery bypass, Angioplasty Right Femoral Peroneal bypass, Thrombectomy of Femoral peroneal artery;  Surgeon: Maeola Harmanain, Brandon Christopher, MD;  [facility];  Service: Vascular;  Laterality: Right;  . INTRAOPERATIVE ARTERIOGRAM Right 03/26/2018   Procedure: Intra Operative Arteriogram;  Surgeon: Maeola Harmanain, Brandon Christopher, MD;  [facility];  Service: Vascular;  Laterality: Right;  . LOWER EXTREMITY ANGIOGRAPHY Bilateral 03/25/2018   Procedure: LOWER EXTREMITY ANGIOGRAPHY;  Surgeon: Maeola Harmanain, Brandon Christopher, MD;  [facility];  Service: Cardiovascular;  Laterality: Bilateral;  . LUMBAR LAMINECTOMY/DECOMPRESSION MICRODISCECTOMY N/A 09/09/2014   Procedure: L4-L5 DECOMPRESSION ;  Surgeon: Venita Lickahari Brooks, MD;  Location: [facility] OR;  Service: Orthopedics;  Laterality: N/A;  . PATCH ANGIOPLASTY Right 03/26/2018   Procedure: PATCH ANGIOPLASTY;  Surgeon: Maeola Harmanain, Brandon Christopher, MD;  [facility];  Service: Vascular;  Laterality: Right;  . SHOULDER SURGERY    . THYROID SURGERY    . TONSILLECTOMY    . TOTAL THYROIDECTOMY    . VEIN HARVEST Right 03/26/2018   Procedure: Vein Harvest of Right Leg Greater Saphenous Vein in SITU;  Surgeon: Maeola Harmanain, Brandon Christopher, MD;  [facility];  Service: Vascular;  Laterality:  Right;    There were no vitals filed for this visit.  Subjective Assessment - 09/04/18 1709    Subjective  Patient reports wearing liner 5 hrs 1x/day. She has not worn prosthesis as not able to use it safely. Her daughter was present for education today.    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  No/denies                       [facility] Treatment/Exercise - 09/04/18 1710      Transfers   Transfers  Sit to Stand;Stand to Sit    Sit to Stand  4: Min guard;5: Supervision;With upper extremity assist;With armrests;From chair/3-in-1   to RW   Sit to Stand Details  Verbal cues for sequencing;Verbal cues for technique;Verbal cues for safe use of DME/AE;Manual facilitation for weight bearing;Tactile cues for sequencing    Stand to Sit  4: Min guard;5: Supervision;With upper extremity assist;With armrests;To chair/3-in-1   from RW   Stand to Sit Details (indicate cue type and reason)  Verbal cues for technique;Verbal cues for sequencing;Verbal cues for safe use of DME/AE;Manual facilitation for weight shifting;Visual cues for safe use of DME/AE    Stand Pivot Transfers  4: Min guard;With armrests   stand pivot with RW w/c to chair w/arm rests   Stand Pivot Transfer Details (indicate cue type and reason)  verbal & tactile cues on TFA prosthesis control & technique      Ambulation/Gait   Ambulation/Gait  Yes    Ambulation/Gait Assistance  4: Min guard;4: Min assist    Ambulation/Gait Assistance Details  PT demo /verbal cues prior & tactile /verbal cues during gait on RW advancement with upright posture, placing prosthetic heel to facilitate knee extension, wt shift over prosthesis then advance LLE.    Ambulation Distance (Feet)  30 Feet    Assistive device  Rolling walker;Prosthesis    Gait Pattern  Step-to pattern;Decreased step length - left;Decreased stance time - right;Decreased stride length;Decreased hip/knee flexion - right;Decreased weight shift to right;Right circumduction;Right hip hike;Antalgic;Lateral hip instability;Trunk flexed;Abducted- right;Poor foot clearance - right    Ambulation Surface  Indoor;Level      Self-Care   Self-Care  ADL's    ADL's  standing  with RW support with TFA prosthesis managing pants with supervision.       Prosthetics   Prosthetic Care Comments   Demo & verbal cues on proper donning with dtr observing to enable her to assist at home.  PT demo/verbal cues on toileting with TFA prosthesis. Pt verbalized understanding.     Current prosthetic wear tolerance (days/week)   liner only daily.    Current prosthetic wear tolerance (#hours/day)   5hrs 1x/day    Residual limb condition   intact per pt report    Education Provided  Skin check;Residual limb care;Proper Donning;Proper wear schedule/adjustment    Person(s) Educated  Patient;Child(ren)    Education Method  Explanation;Demonstration;Verbal cues    Education Method  Verbalized understanding;Needs further instruction    Donning Prosthesis  Minimal assist    Doffing Prosthesis  Supervision               PT Short Term Goals - 08/16/18  0012      PT SHORT TERM GOAL #1   Title  Patient donnes prosthesis including tightening suspension strap in standing with supervision. (ALL STGs updated Target Date: 09/13/2018)    Time  4    Period  Weeks    Status  On-going    Target Date  09/13/18      PT SHORT TERM GOAL #2   Title  Patient tolerates wearing prosthesis & liner >8hrs total /day without skin issues.    Time  4    Period  Weeks    Status  On-going    Target Date  09/13/18      PT SHORT TERM GOAL #3   Title  Patient sit to/from stand chairs with armrests to RW with prosthesis safely with supervision.    Time  4    Period  Weeks    Status  On-going    Target Date  09/13/18      PT SHORT TERM GOAL #4   Title  Patient ambulates 7550' with RW & prosthesis with minA.    Time  4    Period  Weeks    Status  On-going    Target Date  09/13/18      PT SHORT TERM GOAL #5   Title  Standing balance with RW support reaches 2" anteriorly & to knee level with supervision.    Time  4    Period  Weeks    Status  On-going    Target Date  09/13/18        PT Long  Term Goals - 07/18/18 1137      PT LONG TERM GOAL #1   Title  Patient demonstrates & verbalizes proper prosthetic care to enable safe use of prosthesis.  (All LTGs Target Date: 10/11/2018)    Time  12    Period  Weeks    Status  New    Target Date  10/11/18      PT LONG TERM GOAL #2   Title  Patient tolerates prosthesis wear >80% of awake hours to improve ability to function during her day.    Time  12    Period  Weeks    Status  New    Target Date  10/11/18      PT LONG TERM GOAL #3   Title  Patient standing balance with RW support & prosthesis: static no UE support 1 minute, dynamic RW support scans, reaches 7" anteriorly & manages clothes modified independent.    Time  12    Period  Weeks    Status  New    Target Date  10/11/18      PT LONG TERM GOAL #4   Title  Patient ambulates 200' with RW & prosthesis modifiied independent.    Time  12    Period  Weeks    Status  New    Target Date  10/11/18      PT LONG TERM GOAL #5   Title  Patient negotiates ramps & curbs with RW & prosthesis modified independent.    Time  12    Period  Weeks    Status  New    Target Date  10/11/18            Plan - 09/04/18 2013    Clinical Impression Statement  Patient's daughter appears to understand how to assist / supervise donning and transfers sit/stand chairs with armrests & stand-pivot with RW    Personal  Factors and Comorbidities  Age;Comorbidity 3+;Fitness;Past/Current Experience;Time since onset of injury/illness/exacerbation;Other;Transportation   lives alone so limited ability to practice tasks that require assist or supervision   Comorbidities  R TFA, L4-5 decompression sg 2016, DM, HTN,    Examination-Activity Limitations  Bend;Caring for Others;Carry;Locomotion Level;Reach Overhead;Stairs;Stand;Toileting;Transfers    Examination-Participation Restrictions  Community Activity;Church;Meal Prep    Stability/Clinical Decision Making  Evolving/Moderate complexity    Rehab  Potential  Good    PT Frequency  2x / week    PT Duration  12 weeks    PT Treatment/Interventions  ADLs/Self Care Home Management;DME Instruction;Gait training;Stair training;Functional mobility training;Therapeutic activities;Therapeutic exercise;Balance training;Neuromuscular re-education;Patient/family education;Prosthetic Training;Vestibular    PT Next Visit Plan  review prosthetic care, gait with RW/prosthesis, continue to work on transfers at sink for pt to be independent with this at home    Consulted and Agree with Plan of Care  Patient       Patient will benefit from skilled therapeutic intervention in order to improve the following deficits and impairments:  Abnormal gait, Decreased activity tolerance, Decreased balance, Decreased endurance, Decreased knowledge of use of DME, Decreased mobility, Decreased range of motion, Decreased strength, Dizziness, Increased edema, Impaired flexibility, Postural dysfunction, Prosthetic Dependency, Pain, Obesity  Visit Diagnosis: Other abnormalities of gait and mobility  Unsteadiness on feet  Abnormal posture  Muscle weakness (generalized)  Stiffness of right hip, not elsewhere classified     Problem List Patient Active Problem List   Diagnosis Date Noted  . PAD (peripheral artery disease) ([place]) 03/23/2018  . Back pain 09/09/2014  . HYPERLIPIDEMIA 12/18/2006  . RECTAL POLYPS 12/18/2006  . 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 SBO 08/01/2006  . GOITER NOS 03/11/2006  . PERIPHERAL NEUROPATHY 03/11/2006  . HYPERTENSION 03/11/2006  . OSTEOPENIA 03/11/2006  . FOOT DROP 03/11/2006    Jamey Reas  PT, DPT 09/05/2018, 2:16 PM  [facility] [address] [place], [place], [address] Phone: [phone]   Fax:  [phone]  Name: Emma [name] MRN: [id number] Date of Birth: 08/26/1945

## 2018-09-09 ENCOUNTER — Ambulatory Visit: Payer: Medicare HMO | Admitting: Physical Therapy

## 2018-09-11 ENCOUNTER — Ambulatory Visit: Payer: Medicare HMO | Admitting: Physical Therapy

## 2018-09-11 ENCOUNTER — Other Ambulatory Visit: Payer: Self-pay

## 2018-09-11 ENCOUNTER — Encounter: Payer: Self-pay | Admitting: Physical Therapy

## 2018-09-11 DIAGNOSIS — R2681 Unsteadiness on feet: Secondary | ICD-10-CM | POA: Diagnosis not present

## 2018-09-11 DIAGNOSIS — M25651 Stiffness of right hip, not elsewhere classified: Secondary | ICD-10-CM

## 2018-09-11 DIAGNOSIS — R293 Abnormal posture: Secondary | ICD-10-CM | POA: Diagnosis not present

## 2018-09-11 DIAGNOSIS — M6281 Muscle weakness (generalized): Secondary | ICD-10-CM | POA: Diagnosis not present

## 2018-09-11 DIAGNOSIS — R2689 Other abnormalities of gait and mobility: Secondary | ICD-10-CM | POA: Diagnosis not present

## 2018-09-12 NOTE — Therapy (Signed)
[facility] [address] [place], [place], [address] Phone: [phone]   Fax:  [phone]  Physical Therapy Treatment  Patient Details  Name: Emma [name] MRN: [id number] Date of Birth: 1945/11/20 Referring Provider (PT): Emma PlumGeorge Osei-Bonsu, MD   Encounter Date: 09/11/2018   CLINIC OPERATION CHANGES: Outpatient Neuro Rehab is open at lower capacity following universal masking, social distancing, and patient screening.  The patient's COVID risk of complications score is 4.   PT End of Session - 09/11/18 2256    Visit Number  8    Number of Visits  25    Date for PT Re-Evaluation  10/15/18    Authorization Type  HUMANA  Medicare    PT Start Time  1714    PT Stop Time  1752    PT Time Calculation (min)  38 min    Equipment Utilized During Treatment  Gait belt    Activity Tolerance  Patient tolerated treatment well;Patient limited by fatigue    Behavior During Therapy  WFL for tasks assessed/performed       Past Medical History:  Diagnosis Date  . Arthritis    Back and right shoulder  . Constipation   . Diabetes mellitus    Type II  . Diabetes mellitus without complication (HCC)   . Diabetic neuropathy (HCC)   . History of blood transfusion   . Hypercholesteremia   . Hypertension   . Hypothyroidism   . Pneumonia   . Thyroid disease     Past Surgical History:  Procedure Laterality Date  . ABDOMINAL AORTOGRAM N/A 03/25/2018   Procedure: ABDOMINAL AORTOGRAM;  Surgeon: Maeola Harmanain, Brandon Christopher, MD;  [facility];  Service: Cardiovascular;  Laterality: N/A;  . ABDOMINAL HYSTERECTOMY    . AMPUTATION Right 03/29/2018   Procedure: AMPUTATION ABOVE KNEE;  Surgeon: Nada LibmanBrabham, Vance W, MD;  [facility];  Service: Vascular;  Laterality: Right;  . APPENDECTOMY    . BACK SURGERY    . BYPASS GRAFT FEMORAL-PERONEAL Right 03/26/2018   Procedure: BYPASS GRAFT [place] NeighborFEMORAL-PERONEAL;  Surgeon: Maeola Harmanain,  Brandon Christopher, MD;  [facility];  Service: Vascular;  Laterality: Right;  . EXPLORATORY LAPAROTOMY     Adhesions removed and appendix  . FEMORAL-POPLITEAL BYPASS GRAFT Right 03/27/2018   Procedure: Right lower extremity angiogram, Right Femoral Peroneal artery bypass, Angioplasty Right Femoral Peroneal bypass, Thrombectomy of Femoral peroneal artery;  Surgeon: Maeola Harmanain, Brandon Christopher, MD;  [facility];  Service: Vascular;  Laterality: Right;  . INTRAOPERATIVE ARTERIOGRAM Right 03/26/2018   Procedure: Intra Operative Arteriogram;  Surgeon: Maeola Harmanain, Brandon Christopher, MD;  [facility];  Service: Vascular;  Laterality: Right;  . LOWER EXTREMITY ANGIOGRAPHY Bilateral 03/25/2018   Procedure: LOWER EXTREMITY ANGIOGRAPHY;  Surgeon: Maeola Harmanain, Brandon Christopher, MD;  [facility];  Service: Cardiovascular;  Laterality: Bilateral;  . LUMBAR LAMINECTOMY/DECOMPRESSION MICRODISCECTOMY N/A 09/09/2014   Procedure: L4-L5 DECOMPRESSION ;  Surgeon: Venita Lickahari Brooks, MD;  [facility];  Service: Orthopedics;  Laterality: N/A;  . PATCH ANGIOPLASTY Right 03/26/2018   Procedure: PATCH ANGIOPLASTY;  Surgeon: Maeola Harmanain, Brandon Christopher, MD;  [facility];  Service: Vascular;  Laterality: Right;  . SHOULDER SURGERY    . THYROID SURGERY    . TONSILLECTOMY    . TOTAL THYROIDECTOMY    . VEIN HARVEST Right 03/26/2018   Procedure: Vein Harvest of Right Leg Greater Saphenous Vein in SITU;  Surgeon: Maeola Harmanain, Brandon Christopher, MD;  [facility];  Service: Vascular;  Laterality:  Right;    There were no vitals filed for this visit.  Subjective Assessment - 09/11/18 1515    Subjective  Her daughter has not been able to come over so she has not been able to donne or use the prosthesis.    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                       [facility] Adult PT Treatment/Exercise - 09/11/18 1715      Transfers   Transfers  Sit to Stand;Stand to Sit    Sit to Stand  4: Min guard;5: Supervision;With upper extremity assist;With armrests;From chair/3-in-1   to RW   Sit to Stand Details  Verbal cues for sequencing;Verbal cues for technique;Verbal cues for safe use of DME/AE;Manual facilitation for weight bearing;Tactile cues for sequencing;Visual cues for safe use of DME/AE;Visual cues/gestures for sequencing    Stand to Sit  4: Min guard;5: Supervision;With upper extremity assist;With armrests;To chair/3-in-1   from RW   Stand to Sit Details (indicate cue type and reason)  Verbal cues for technique;Verbal cues for sequencing;Verbal cues for safe use of DME/AE;Manual facilitation for weight shifting;Visual cues for safe use of DME/AE    Stand Pivot Transfers  4: Min guard;With armrests   stand pivot with RW w/c to chair w/arm rests   Stand Pivot Transfer Details (indicate cue type and reason)  verbal & tactile cues on TFA prosthesis control & technique      Ambulation/Gait   Ambulation/Gait  Yes    Ambulation/Gait Assistance  4: Min assist    Ambulation/Gait Assistance Details  demo, verbal & tactile cues on upright posture, RW advancement, initial contact & wt shift    Ambulation Distance (Feet)  30 Feet   30' X 2 including turning 90* to position to sit   Assistive device  Rolling walker;Prosthesis    Gait Pattern  Step-to pattern;Decreased step length - left;Decreased stance time - right;Decreased stride length;Decreased hip/knee flexion - right;Decreased weight shift to right;Right circumduction;Right hip hike;Antalgic;Lateral hip instability;Trunk flexed;Abducted- right;Poor foot clearance - right    Ambulation Surface  Indoor;Level      Self-Care   Self-Care  ADL's    ADL's  standing with RW support with TFA prosthesis managing pants with supervision.        Prosthetics   Prosthetic Care Comments   Demo & verbal cues on proper donning with dtr observing to enable her to assist at home.  PT demo/verbal cues on toileting with TFA prosthesis. Pt verbalized understanding.     Current prosthetic wear tolerance (days/week)   liner only daily.    Current prosthetic wear tolerance (#hours/day)   5hrs 1x/day    Residual limb condition   intact with no open areas, normal color & temperature    Education Provided  Skin check;Residual limb care;Proper Donning;Proper wear schedule/adjustment    Person(s) Educated  Patient;Child(ren)    Education Method  Explanation;Demonstration;Tactile cues;Verbal cues    Education Method  Verbalized understanding;Needs further instruction    Donning Prosthesis  Minimal assist    Doffing Prosthesis  Supervision               PT Short Term Goals - 09/12/18 1340  PT SHORT TERM GOAL #1   Title  Patient donnes prosthesis including tightening suspension strap in standing with supervision. (ALL STGs updated Target Date: 10/11/2018)    Time  4    Period  Weeks    Status  On-going    Target Date  10/11/18      PT SHORT TERM GOAL #2   Title  Patient tolerates wearing prosthesis >3hrs/day & liner >8hrs total /day without skin issues.    Time  4    Period  Weeks    Status  On-going    Target Date  10/11/18      PT SHORT TERM GOAL #3   Title  Patient sit to/from stand chairs with armrests to RW with prosthesis safely with supervision.    Time  4    Period  Weeks    Status  On-going    Target Date  10/11/18      PT SHORT TERM GOAL #4   Title  Patient ambulates 67' with RW & prosthesis with minA.    Time  4    Period  Weeks    Status  On-going    Target Date  10/11/18      PT SHORT TERM GOAL #5   Title  Standing balance with RW support reaches 2" anteriorly & to knee level with supervision.    Time  4    Period  Weeks    Status  On-going    Target Date  10/11/18        PT Long Term Goals - 07/18/18  1137      PT LONG TERM GOAL #1   Title  Patient demonstrates & verbalizes proper prosthetic care to enable safe use of prosthesis.  (All LTGs Target Date: 10/11/2018)    Time  12    Period  Weeks    Status  New    Target Date  10/11/18      PT LONG TERM GOAL #2   Title  Patient tolerates prosthesis wear >80% of awake hours to improve ability to function during her day.    Time  12    Period  Weeks    Status  New    Target Date  10/11/18      PT LONG TERM GOAL #3   Title  Patient standing balance with RW support & prosthesis: static no UE support 1 minute, dynamic RW support scans, reaches 7" anteriorly & manages clothes modified independent.    Time  12    Period  Weeks    Status  New    Target Date  10/11/18      PT LONG TERM GOAL #4   Title  Patient ambulates 200' with RW & prosthesis modifiied independent.    Time  12    Period  Weeks    Status  New    Target Date  10/11/18      PT LONG TERM GOAL #5   Title  Patient negotiates ramps & curbs with RW & prosthesis modified independent.    Time  12    Period  Weeks    Status  New    Target Date  10/11/18            Plan - 09/12/18 1335    Clinical Impression Statement  Patient is making very slow progress due to attendance. Patient lives alone & relies on daughter getting off work to be able to bring her.  PT is working on  [facility] transportation to enable proper attendance with therapy which should enable better progress.  Patient will probably not meet STGs this week or LTGs in next 30 days.    Personal Factors and Comorbidities  Age;Comorbidity 3+;Fitness;Past/Current Experience;Time since onset of injury/illness/exacerbation;Other;Transportation   lives alone so limited ability to practice tasks that require assist or supervision   Comorbidities  R TFA, L4-5 decompression sg 2016, DM, HTN,    Examination-Activity Limitations  Bend;Caring for Others;Carry;Locomotion Level;Reach Overhead;Stairs;Stand;Toileting;Transfers     Examination-Participation Restrictions  Community Activity;Church;Meal Prep    Stability/Clinical Decision Making  Evolving/Moderate complexity    Rehab Potential  Good    PT Frequency  2x / week    PT Duration  12 weeks    PT Treatment/Interventions  ADLs/Self Care Home Management;DME Instruction;Gait training;Stair training;Functional mobility training;Therapeutic activities;Therapeutic exercise;Balance training;Neuromuscular re-education;Patient/family education;Prosthetic Training;Vestibular    PT Next Visit Plan  work towards updated STGs, review prosthetic care, gait with RW/prosthesis, continue to work on transfers at sink for pt to be independent with this at home    Consulted and Agree with Plan of Care  Patient       Patient will benefit from skilled therapeutic intervention in order to improve the following deficits and impairments:  Abnormal gait, Decreased activity tolerance, Decreased balance, Decreased endurance, Decreased knowledge of use of DME, Decreased mobility, Decreased range of motion, Decreased strength, Dizziness, Increased edema, Impaired flexibility, Postural dysfunction, Prosthetic Dependency, Pain, Obesity  Visit Diagnosis: Other abnormalities of gait and mobility  Unsteadiness on feet  Abnormal posture  Muscle weakness (generalized)  Stiffness of right hip, not elsewhere classified     Problem List Patient Active Problem List   Diagnosis Date Noted  . PAD (peripheral artery disease) ([place]) 03/23/2018  . Back pain 09/09/2014  . HYPERLIPIDEMIA 12/18/2006  . RECTAL POLYPS 12/18/2006  . 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 SBO 08/01/2006  . GOITER NOS 03/11/2006  . PERIPHERAL NEUROPATHY 03/11/2006  . HYPERTENSION 03/11/2006  . OSTEOPENIA 03/11/2006  . FOOT DROP 03/11/2006    Jamey Reas PT, DPT 09/12/2018, 1:42 PM  [facility] [address] [place], [place], [address] Phone: [phone]   Fax:   [phone]  Name: Emma [name] MRN: [id number] Date of Birth: 1945/10/09

## 2018-09-16 ENCOUNTER — Encounter: Payer: Self-pay | Admitting: Physical Therapy

## 2018-09-16 ENCOUNTER — Other Ambulatory Visit: Payer: Self-pay

## 2018-09-16 ENCOUNTER — Ambulatory Visit: Payer: Medicare HMO | Admitting: Physical Therapy

## 2018-09-16 DIAGNOSIS — M25651 Stiffness of right hip, not elsewhere classified: Secondary | ICD-10-CM | POA: Diagnosis not present

## 2018-09-16 DIAGNOSIS — R2689 Other abnormalities of gait and mobility: Secondary | ICD-10-CM

## 2018-09-16 DIAGNOSIS — M6281 Muscle weakness (generalized): Secondary | ICD-10-CM | POA: Diagnosis not present

## 2018-09-16 DIAGNOSIS — R2681 Unsteadiness on feet: Secondary | ICD-10-CM

## 2018-09-16 DIAGNOSIS — R293 Abnormal posture: Secondary | ICD-10-CM | POA: Diagnosis not present

## 2018-09-16 NOTE — Therapy (Signed)
[facility] [address] [place], [place], [address] Phone: [phone]   Fax:  [phone]  Physical Therapy Treatment  Patient Details  Name: Emma [name] MRN: [id number] Date of Birth: 1945-02-25 Referring Provider (PT): Benito Mccreedy, MD   Encounter Date: 09/16/2018  PT End of Session - 09/16/18 1704    Visit Number  9    Number of Visits  25    Date for PT Re-Evaluation  10/15/18    Authorization Type  HUMANA  Medicare    PT Start Time  1700    PT Stop Time  1746    PT Time Calculation (min)  46 min    Equipment Utilized During Treatment  Gait belt    Activity Tolerance  Patient tolerated treatment well;Patient limited by fatigue    Behavior During Therapy  [facility] for tasks assessed/performed       Past Medical History:  Diagnosis Date  . Arthritis    Back and right shoulder  . Constipation   . Diabetes mellitus    Type II  . Diabetes mellitus without complication ([place])   . Diabetic neuropathy ([place])   . History of blood transfusion   . Hypercholesteremia   . Hypertension   . Hypothyroidism   . Pneumonia   . Thyroid disease     Past Surgical History:  Procedure Laterality Date  . ABDOMINAL AORTOGRAM N/A 03/25/2018   Procedure: ABDOMINAL AORTOGRAM;  Surgeon: Waynetta Sandy, MD;  Location: [place] [facility];  Service: Cardiovascular;  Laterality: N/A;  . ABDOMINAL HYSTERECTOMY    . AMPUTATION Right 03/29/2018   Procedure: AMPUTATION ABOVE KNEE;  Surgeon: Serafina Mitchell, MD;  Location: [place];  Service: Vascular;  Laterality: Right;  . APPENDECTOMY    . BACK SURGERY    . BYPASS GRAFT FEMORAL-PERONEAL Right 03/26/2018   Procedure: BYPASS GRAFT [place];  Surgeon: Waynetta Sandy, MD;  Location: [place];  Service: Vascular;  Laterality: Right;  . EXPLORATORY LAPAROTOMY     Adhesions removed and appendix  . FEMORAL-POPLITEAL BYPASS GRAFT Right 03/27/2018   Procedure:  Right lower extremity angiogram, Right Femoral Peroneal artery bypass, Angioplasty Right Femoral Peroneal bypass, Thrombectomy of Femoral peroneal artery;  Surgeon: Waynetta Sandy, MD;  Location: [facility];  Service: Vascular;  Laterality: Right;  . INTRAOPERATIVE ARTERIOGRAM Right 03/26/2018   Procedure: Intra Operative Arteriogram;  Surgeon: Waynetta Sandy, MD;  Location: [facility];  Service: Vascular;  Laterality: Right;  . LOWER EXTREMITY ANGIOGRAPHY Bilateral 03/25/2018   Procedure: LOWER EXTREMITY ANGIOGRAPHY;  Surgeon: Waynetta Sandy, MD;  Location: [place] [facility];  Service: Cardiovascular;  Laterality: Bilateral;  . LUMBAR LAMINECTOMY/DECOMPRESSION MICRODISCECTOMY N/A 09/09/2014   Procedure: L4-L5 DECOMPRESSION ;  Surgeon: Melina Schools, MD;  Location: [facility];  Service: Orthopedics;  Laterality: N/A;  . PATCH ANGIOPLASTY Right 03/26/2018   Procedure: PATCH ANGIOPLASTY;  Surgeon: Waynetta Sandy, MD;  Location: [facility];  Service: Vascular;  Laterality: Right;  . SHOULDER SURGERY    . THYROID SURGERY    . TONSILLECTOMY    . TOTAL THYROIDECTOMY    . VEIN HARVEST Right 03/26/2018   Procedure: Vein Harvest of Right Leg Greater Saphenous Vein in SITU;  Surgeon: Waynetta Sandy, MD;  Location: [facility];  Service: Vascular;  Laterality: Right;    There were no vitals filed for this visit.  Subjective Assessment - 09/16/18 1703    Subjective  No new complaints. No falls. Daughter has been over  2 times since last session where she has put the prosthesis on and worked on transfers with it. Used new transportation system with [employer] today, liked it other than the rain.    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  No/denies    Pain Score  0-No pain           [facility] Adult PT  Treatment/Exercise - 09/16/18 1715      Transfers   Transfers  Sit to Stand;Stand to Sit    Sit to Stand  4: Min guard;With upper extremity assist;From chair/3-in-1    Sit to Stand Details  Verbal cues for sequencing;Verbal cues for technique;Verbal cues for safe use of DME/AE;Manual facilitation for weight bearing;Tactile cues for sequencing;Visual cues for safe use of DME/AE;Visual cues/gestures for sequencing    Sit to Stand Details (indicate cue type and reason)  cues to scoot closer to edge of seat prior to standing. reminder cues for prosthetic foot placement prior to standing to allow knee to engage/lock    Stand to Sit  4: Min guard;With upper extremity assist;To chair/3-in-1    Stand to Sit Details (indicate cue type and reason)  Verbal cues for technique;Verbal cues for sequencing;Verbal cues for safe use of DME/AE;Manual facilitation for weight shifting;Visual cues for safe use of DME/AE    Stand Pivot Transfers  4: Min guard;With armrests    Stand Pivot Transfer Details (indicate cue type and reason)  cues for technique/sequencing with 90 degree turns to sit after gait wtih RW/prosthesis      Ambulation/Gait   Ambulation/Gait  Yes    Ambulation/Gait Assistance  4: Min assist    Ambulation/Gait Assistance Details  multimodal cues/facilitiaton for upright posutre, step position, weight shifting and to enure prostheic knee locks with gait. decreased cues/assistance needed as gait reps procceded.     Ambulation Distance (Feet)  30 Feet   x4 reps   Assistive device  Rolling walker;Prosthesis    Gait Pattern  Step-to pattern;Decreased step length - left;Decreased stance time - right;Decreased stride length;Decreased hip/knee flexion - right;Decreased weight shift to right;Right circumduction;Right hip hike;Antalgic;Lateral hip instability;Trunk flexed;Abducted- right;Poor foot clearance - right    Ambulation Surface  Level;Indoor      Prosthetics   Prosthetic Care Comments   has worn the  prosthesis for 2 days with daughter's help since last appt.; pt asking about return to driving. dicussed left foot driving, hand controls and left foot portable accelerator. Provided pt with a [employer] for company that offers hand control modifications to look into as pt does not feel she would be able to drive with left foot.     Current prosthetic wear tolerance (days/week)   liner only daily.    Current prosthetic wear tolerance (#hours/day)   5hrs 1x/day    Residual limb condition   intact per pt report    Education Provided  Skin check;Proper Donning;Proper Doffing;Proper wear schedule/adjustment;Proper weight-bearing schedule/adjustment   driving options   Person(s) Educated  Patient    Education Method  Explanation;Demonstration;Verbal cues;Handout    Education Method  Verbalized understanding;Returned demonstration;Verbal cues required;Needs further instruction    Donning Prosthesis  Minimal assist    Doffing Prosthesis  Supervision               PT Short Term Goals -  09/12/18 1340      PT SHORT TERM GOAL #1   Title  Patient donnes prosthesis including tightening suspension strap in standing with supervision. (ALL STGs updated Target Date: 10/11/2018)    Time  4    Period  Weeks    Status  On-going    Target Date  10/11/18      PT SHORT TERM GOAL #2   Title  Patient tolerates wearing prosthesis >3hrs/day & liner >8hrs total /day without skin issues.    Time  4    Period  Weeks    Status  On-going    Target Date  10/11/18      PT SHORT TERM GOAL #3   Title  Patient sit to/from stand chairs with armrests to RW with prosthesis safely with supervision.    Time  4    Period  Weeks    Status  On-going    Target Date  10/11/18      PT SHORT TERM GOAL #4   Title  Patient ambulates 7150' with RW & prosthesis with minA.    Time  4    Period  Weeks    Status  On-going    Target Date  10/11/18      PT SHORT TERM GOAL #5   Title  Standing balance with RW support reaches 2"  anteriorly & to knee level with supervision.    Time  4    Period  Weeks    Status  On-going    Target Date  10/11/18        PT Long Term Goals - 07/18/18 1137      PT LONG TERM GOAL #1   Title  Patient demonstrates & verbalizes proper prosthetic care to enable safe use of prosthesis.  (All LTGs Target Date: 10/11/2018)    Time  12    Period  Weeks    Status  New    Target Date  10/11/18      PT LONG TERM GOAL #2   Title  Patient tolerates prosthesis wear >80% of awake hours to improve ability to function during her day.    Time  12    Period  Weeks    Status  New    Target Date  10/11/18      PT LONG TERM GOAL #3   Title  Patient standing balance with RW support & prosthesis: static no UE support 1 minute, dynamic RW support scans, reaches 7" anteriorly & manages clothes modified independent.    Time  12    Period  Weeks    Status  New    Target Date  10/11/18      PT LONG TERM GOAL #4   Title  Patient ambulates 200' with RW & prosthesis modifiied independent.    Time  12    Period  Weeks    Status  New    Target Date  10/11/18      PT LONG TERM GOAL #5   Title  Patient negotiates ramps & curbs with RW & prosthesis modified independent.    Time  12    Period  Weeks    Status  New    Target Date  10/11/18            Plan - 09/16/18 1705    Clinical Impression Statement  Today's skilled session continued to focus on prosthetic education, transfer training and short distance gait training. The pt is making slow, steady progress  toward goals.    Personal Factors and Comorbidities  Age;Comorbidity 3+;Fitness;Past/Current Experience;Time since onset of injury/illness/exacerbation;Other;Transportation   lives alone so limited ability to practice tasks that require assist or supervision   Comorbidities  R TFA, L4-5 decompression sg 2016, DM, HTN,    Examination-Activity Limitations  Bend;Caring for Others;Carry;Locomotion Level;Reach  Overhead;Stairs;Stand;Toileting;Transfers    Examination-Participation Restrictions  Community Activity;Church;Meal Prep    Stability/Clinical Decision Making  Evolving/Moderate complexity    Rehab Potential  Good    PT Frequency  2x / week    PT Duration  12 weeks    PT Treatment/Interventions  ADLs/Self Care Home Management;DME Instruction;Gait training;Stair training;Functional mobility training;Therapeutic activities;Therapeutic exercise;Balance training;Neuromuscular re-education;Patient/family education;Prosthetic Training;Vestibular    PT Next Visit Plan 10th visit progress note due next visit. work towards updated STGs (PT plans to recert at that time), review prosthetic care, gait with RW/prosthesis, continue to work on transfers at sink for pt to be independent with this at home    Consulted and Agree with Plan of Care  Patient       Patient will benefit from skilled therapeutic intervention in order to improve the following deficits and impairments:  Abnormal gait, Decreased activity tolerance, Decreased balance, Decreased endurance, Decreased knowledge of use of DME, Decreased mobility, Decreased range of motion, Decreased strength, Dizziness, Increased edema, Impaired flexibility, Postural dysfunction, Prosthetic Dependency, Pain, Obesity  Visit Diagnosis: Other abnormalities of gait and mobility  Unsteadiness on feet  Abnormal posture  Muscle weakness (generalized)     Problem List Patient Active Problem List   Diagnosis Date Noted  . PAD (peripheral artery disease) ([facility]) 03/23/2018  . Back pain 09/09/2014  . HYPERLIPIDEMIA 12/18/2006  . RECTAL POLYPS 12/18/2006  . 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 SBO 08/01/2006  . GOITER NOS 03/11/2006  . PERIPHERAL NEUROPATHY 03/11/2006  . HYPERTENSION 03/11/2006  . OSTEOPENIA 03/11/2006  . FOOT DROP 03/11/2006    Sallyanne KusterKathy Quadry Kampa, PTA, [facility] [address], [address] [place], [place] [address] [phone] 09/16/18,  6:06 PM   Name: Emma [name] MRN: [id number] Date of Birth: [birth year]

## 2018-09-19 ENCOUNTER — Encounter: Payer: Self-pay | Admitting: Physical Therapy

## 2018-09-19 ENCOUNTER — Other Ambulatory Visit: Payer: Self-pay

## 2018-09-19 ENCOUNTER — Encounter: Payer: Medicare HMO | Admitting: Physical Therapy

## 2018-09-19 ENCOUNTER — Ambulatory Visit: Payer: Medicare HMO | Attending: Internal Medicine | Admitting: Physical Therapy

## 2018-09-19 DIAGNOSIS — M25651 Stiffness of right hip, not elsewhere classified: Secondary | ICD-10-CM | POA: Insufficient documentation

## 2018-09-19 DIAGNOSIS — M6281 Muscle weakness (generalized): Secondary | ICD-10-CM | POA: Insufficient documentation

## 2018-09-19 DIAGNOSIS — R2689 Other abnormalities of gait and mobility: Secondary | ICD-10-CM | POA: Diagnosis not present

## 2018-09-19 DIAGNOSIS — R293 Abnormal posture: Secondary | ICD-10-CM | POA: Diagnosis not present

## 2018-09-19 DIAGNOSIS — R2681 Unsteadiness on feet: Secondary | ICD-10-CM | POA: Diagnosis not present

## 2018-09-19 NOTE — Therapy (Signed)
[facility] [address] [place], [place], [address] Phone: [phone]   Fax:  [phone]  Physical Therapy Treatment  Patient Details  Name: Emma PeopleDeitra Choung MRN: [id number] Date of Birth: 1946-01-16 Referring Provider (PT): Jackie PlumGeorge Osei-Bonsu, MD   Encounter Date: 09/19/2018  PT End of Session - 09/19/18 1843    Visit Number  10    Number of Visits  25    Date for PT Re-Evaluation  10/15/18    Authorization Type  HUMANA  Medicare    PT Start Time  1749    PT Stop Time  1833    PT Time Calculation (min)  44 min    Equipment Utilized During Treatment  Gait belt    Activity Tolerance  Patient tolerated treatment well;Patient limited by fatigue    Behavior During Therapy  [facility] for tasks assessed/performed       Past Medical History:  Diagnosis Date  . Arthritis    Back and right shoulder  . Constipation   . Diabetes mellitus    Type II  . Diabetes mellitus without complication ([facility])   . Diabetic neuropathy ([facility])   . History of blood transfusion   . Hypercholesteremia   . Hypertension   . Hypothyroidism   . Pneumonia   . Thyroid disease     Past Surgical History:  Procedure Laterality Date  . ABDOMINAL AORTOGRAM N/A 03/25/2018   Procedure: ABDOMINAL AORTOGRAM;  Surgeon: Maeola Harmanain, Brandon Christopher, MD;  [facility];  Service: Cardiovascular;  Laterality: N/A;  . ABDOMINAL HYSTERECTOMY    . AMPUTATION Right 03/29/2018   Procedure: AMPUTATION ABOVE KNEE;  Surgeon: Nada LibmanBrabham, Vance W, MD;  [facility];  Service: Vascular;  Laterality: Right;  . APPENDECTOMY    . BACK SURGERY    . BYPASS GRAFT FEMORAL-PERONEAL Right 03/26/2018   Procedure: BYPASS GRAFT [place]ighbor[place]ORAL-PERONEAL;  Surgeon: Maeola Harmanain, Brandon Christopher, MD;  [facility];  Service: Vascular;  Laterality: Right;  . EXPLORATORY LAPAROTOMY     Adhesions removed and appendix  . FEMORAL-POPLITEAL BYPASS GRAFT Right 03/27/2018   Procedure:  Right lower extremity angiogram, Right Femoral Peroneal artery bypass, Angioplasty Right Femoral Peroneal bypass, Thrombectomy of Femoral peroneal artery;  Surgeon: Maeola Harmanain, Brandon Christopher, MD;  [facility];  Service: Vascular;  Laterality: Right;  . INTRAOPERATIVE ARTERIOGRAM Right 03/26/2018   Procedure: Intra Operative Arteriogram;  Surgeon: Maeola Harmanain, Brandon Christopher, MD;  [facility];  Service: Vascular;  Laterality: Right;  . LOWER EXTREMITY ANGIOGRAPHY Bilateral 03/25/2018   Procedure: LOWER EXTREMITY ANGIOGRAPHY;  Surgeon: Maeola Harmanain, Brandon Christopher, MD;  [facility];  Service: Cardiovascular;  Laterality: Bilateral;  . LUMBAR LAMINECTOMY/DECOMPRESSION MICRODISCECTOMY N/A 09/09/2014   Procedure: L4-L5 DECOMPRESSION ;  Surgeon: Venita Lickahari Brooks, MD;  [facility];  Service: Orthopedics;  Laterality: N/A;  . PATCH ANGIOPLASTY Right 03/26/2018   Procedure: PATCH ANGIOPLASTY;  Surgeon: Maeola Harmanain, Brandon Christopher, MD;  [facility];  Service: Vascular;  Laterality: Right;  . SHOULDER SURGERY    . THYROID SURGERY    . TONSILLECTOMY    . TOTAL THYROIDECTOMY    . VEIN HARVEST Right 03/26/2018   Procedure: Vein Harvest of Right Leg Greater Saphenous Vein in SITU;  Surgeon: Maeola Harmanain, Brandon Christopher, MD;  [facility];  Service: Vascular;  Laterality: Right;    There were no vitals filed for this visit.  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  Yes    Pain Score  5     Pain Location  Shoulder    Pain Orientation  Left    Pain Descriptors / Indicators  Aching;Sharp    Pain Onset  More than a month ago    Pain Frequency  Intermittent    Aggravating Factors   rolling w/c    Pain Relieving Factors  has a rub pt  puts on.                       [facility] Adult PT Treatment/Exercise - 09/19/18 0001      Transfers   Transfers  Sit to Stand;Stand to Sit    Sit to Stand  4: Min guard;With upper extremity assist;From chair/3-in-1    Sit to Stand Details  Verbal cues for sequencing;Verbal cues for technique;Verbal cues for safe use of DME/AE;Manual facilitation for weight bearing;Tactile cues for sequencing;Visual cues for safe use of DME/AE;Visual cues/gestures for sequencing    Stand to Sit  4: Min guard;With upper extremity assist;To chair/3-in-1    Stand to Sit Details (indicate cue type and reason)  Verbal cues for technique;Verbal cues for sequencing;Verbal cues for safe use of DME/AE;Manual facilitation for weight shifting;Visual cues for safe use of DME/AE    Stand Pivot Transfers  4: Min guard;With armrests    Stand Pivot Transfer Details (indicate cue type and reason)  increased time needed, cues for posture when  backing up to sit.      Ambulation/Gait   Ambulation/Gait  Yes    Ambulation/Gait Assistance  4: Min assist    Ambulation/Gait Assistance Details  multimodal cues/facilitiaton for upright posutre, step position, weight shifting and to enure prostheic knee locks with gait. decreased cues/assistance needed as gait reps proceeded    Ambulation Distance (Feet)  40 Feet   + 20x2   Assistive device  Rolling walker;Prosthesis    Gait Pattern  Step-to pattern;Step-through pattern   inconsistently performing step through pattern with cues   Ambulation Surface  Level;Indoor      Prosthetics   Prosthetic Care Comments   Recommend pt increase liner wear time to progress towards STGs.    Current prosthetic wear tolerance (days/week)   liner only daily.    Current prosthetic wear tolerance (#hours/day)   5hrs 1x/day    Residual limb condition   intact per pt report    Education Provided  Skin check;Proper Donning;Proper Doffing;Proper wear schedule/adjustment;Proper weight-bearing  schedule/adjustment    Person(s) Educated  Patient    Education Method  Explanation;Demonstration;Tactile cues;Verbal cues    Education Method  Verbalized understanding;Returned demonstration;Tactile cues required;Verbal cues required;Needs further instruction    Donning Prosthesis  Minimal assist    Doffing Prosthesis  Supervision               PT Short Term Goals - 09/12/18 1340      PT SHORT TERM GOAL #1   Title  Patient donnes prosthesis including tightening suspension strap in standing with supervision. (ALL STGs updated [place] Date: 10/11/2018)    Time  4    Period  Weeks    Status  On-going    Target Date  10/11/18      PT SHORT TERM GOAL #2  Title  Patient tolerates wearing prosthesis >3hrs/day & liner >8hrs total /day without skin issues.    Time  4    Period  Weeks    Status  On-going    Target Date  10/11/18      PT SHORT TERM GOAL #3   Title  Patient sit to/from stand chairs with armrests to RW with prosthesis safely with supervision.    Time  4    Period  Weeks    Status  On-going    Target Date  10/11/18      PT SHORT TERM GOAL #4   Title  Patient ambulates 65' with RW & prosthesis with minA.    Time  4    Period  Weeks    Status  On-going    Target Date  10/11/18      PT SHORT TERM GOAL #5   Title  Standing balance with RW support reaches 2" anteriorly & to knee level with supervision.    Time  4    Period  Weeks    Status  On-going    Target Date  10/11/18        PT Long Term Goals - 07/18/18 1137      PT LONG TERM GOAL #1   Title  Patient demonstrates & verbalizes proper prosthetic care to enable safe use of prosthesis.  (All LTGs Target Date: 10/11/2018)    Time  12    Period  Weeks    Status  New    Target Date  10/11/18      PT LONG TERM GOAL #2   Title  Patient tolerates prosthesis wear >80% of awake hours to improve ability to function during her day.    Time  12    Period  Weeks    Status  New    Target Date  10/11/18       PT LONG TERM GOAL #3   Title  Patient standing balance with RW support & prosthesis: static no UE support 1 minute, dynamic RW support scans, reaches 7" anteriorly & manages clothes modified independent.    Time  12    Period  Weeks    Status  New    Target Date  10/11/18      PT LONG TERM GOAL #4   Title  Patient ambulates 200' with RW & prosthesis modifiied independent.    Time  12    Period  Weeks    Status  New    Target Date  10/11/18      PT LONG TERM GOAL #5   Title  Patient negotiates ramps & curbs with RW & prosthesis modified independent.    Time  12    Period  Weeks    Status  New    Target Date  10/11/18            Plan - 09/19/18 1844    Clinical Impression Statement  Skilled session focused prosthetic education, transfer training, and gait mechanics with short distances.  Pt is making slow progress towards STGs performing at min A - min guard level.    Personal Factors and Comorbidities  Age;Comorbidity 3+;Fitness;Past/Current Experience;Time since onset of injury/illness/exacerbation;Other;Transportation   lives alone so limited ability to practice tasks that require assist or supervision   Comorbidities  R TFA, L4-5 decompression sg 2016, DM, HTN,    Examination-Activity Limitations  Bend;Caring for Others;Carry;Locomotion Level;Reach Overhead;Stairs;Stand;Toileting;Transfers    Examination-Participation Restrictions  Community Activity;Church;Meal Prep  Stability/Clinical Decision Making  Evolving/Moderate complexity    Rehab Potential  Good    PT Frequency  2x / week    PT Duration  12 weeks    PT Treatment/Interventions  ADLs/Self Care Home Management;DME Instruction;Gait training;Stair training;Functional mobility training;Therapeutic activities;Therapeutic exercise;Balance training;Neuromuscular re-education;Patient/family education;Prosthetic Training;Vestibular    PT Next Visit Plan  work towards updated STGs (PT plans to recert at that time), review  prosthetic care, gait with RW/prosthesis, continue to work on transfers at sink for pt to be independent with this at home    Consulted and Agree with Plan of Care  Patient       Patient will benefit from skilled therapeutic intervention in order to improve the following deficits and impairments:  Abnormal gait, Decreased activity tolerance, Decreased balance, Decreased endurance, Decreased knowledge of use of DME, Decreased mobility, Decreased range of motion, Decreased strength, Dizziness, Increased edema, Impaired flexibility, Postural dysfunction, Prosthetic Dependency, Pain, Obesity  Visit Diagnosis: Other abnormalities of gait and mobility  Unsteadiness on feet  Abnormal posture  Muscle weakness (generalized)     Problem List Patient Active Problem List   Diagnosis Date Noted  . PAD (peripheral artery disease) ([place]) 03/23/2018  . Back pain 09/09/2014  . HYPERLIPIDEMIA 12/18/2006  . RECTAL POLYPS 12/18/2006  . 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 SBO 08/01/2006  . GOITER NOS 03/11/2006  . PERIPHERAL NEUROPATHY 03/11/2006  . HYPERTENSION 03/11/2006  . OSTEOPENIA 03/11/2006  . FOOT DROP 03/11/2006    Bjorn Loser, PTA  09/19/18, 6:57 PM [facility] [address] [place], [place], [address] Phone: [phone]   Fax:  [phone]  Name: Emma [name] MRN: [id number] Date of Birth: 1945/03/08

## 2018-09-23 DIAGNOSIS — I739 Peripheral vascular disease, unspecified: Secondary | ICD-10-CM | POA: Diagnosis not present

## 2018-09-23 DIAGNOSIS — E119 Type 2 diabetes mellitus without complications: Secondary | ICD-10-CM | POA: Diagnosis not present

## 2018-09-23 DIAGNOSIS — Z89611 Acquired absence of right leg above knee: Secondary | ICD-10-CM | POA: Diagnosis not present

## 2018-09-24 ENCOUNTER — Encounter: Payer: Medicare HMO | Admitting: Physical Therapy

## 2018-09-26 ENCOUNTER — Ambulatory Visit: Payer: Medicare HMO | Admitting: Physical Therapy

## 2018-09-26 ENCOUNTER — Encounter: Payer: Self-pay | Admitting: Physical Therapy

## 2018-09-26 ENCOUNTER — Other Ambulatory Visit: Payer: Self-pay

## 2018-09-26 DIAGNOSIS — R2689 Other abnormalities of gait and mobility: Secondary | ICD-10-CM

## 2018-09-26 DIAGNOSIS — R293 Abnormal posture: Secondary | ICD-10-CM | POA: Diagnosis not present

## 2018-09-26 DIAGNOSIS — M6281 Muscle weakness (generalized): Secondary | ICD-10-CM | POA: Diagnosis not present

## 2018-09-26 DIAGNOSIS — M25651 Stiffness of right hip, not elsewhere classified: Secondary | ICD-10-CM | POA: Diagnosis not present

## 2018-09-26 DIAGNOSIS — R2681 Unsteadiness on feet: Secondary | ICD-10-CM

## 2018-09-26 NOTE — Therapy (Addendum)
[facility] [address] [place], [place], [address] Phone: [phone]   Fax:  [phone]  Physical Therapy Treatment & 10th Visit Progress Note  Patient Details  Name: Emma [name] MRN: [id number] Date of Birth: 1945/04/25 Referring Provider (PT): Benito Mccreedy, MD   Encounter Date: 09/26/2018  PT End of Session - 09/26/18 1850    Visit Number  11    Number of Visits  25    Date for PT Re-Evaluation  10/15/18    Authorization Type  HUMA[employer]  Medicare    PT Start Time  5638    PT Stop Time  1835    PT Time Calculation (min)  42 min    Equipment Utilized During Treatment  Gait belt    Activity Tolerance  Patient tolerated treatment well;Patient limited by fatigue    Behavior During Therapy  [facility] for tasks assessed/performed       Past Medical History:  Diagnosis Date  . Arthritis    Back and right shoulder  . Constipation   . Diabetes mellitus    Type II  . Diabetes mellitus without complication ([place])   . Diabetic neuropathy ([place])   . History of blood transfusion   . Hypercholesteremia   . Hypertension   . Hypothyroidism   . Pneumonia   . Thyroid disease     Past Surgical History:  Procedure Laterality Date  . ABDOMINAL AORTOGRAM N/A 03/25/2018   Procedure: ABDOMINAL AORTOGRAM;  Surgeon: Waynetta Sandy, MD;  Location: [facility];  Service: Cardiovascular;  Laterality: N/A;  . ABDOMINAL HYSTERECTOMY    . AMPUTATION Right 03/29/2018   Procedure: AMPUTATION ABOVE KNEE;  Surgeon: Serafina Mitchell, MD;  Location: [facility];  Service: Vascular;  Laterality: Right;  . APPENDECTOMY    . BACK SURGERY    . BYPASS GRAFT FEMORAL-PERONEAL Right 03/26/2018   Procedure: BYPASS GRAFT Campbell Stall;  Surgeon: Waynetta Sandy, MD;  Location: [facility];  Service: Vascular;  Laterality: Right;  . EXPLORATORY LAPAROTOMY     Adhesions removed and appendix  . FEMORAL-POPLITEAL BYPASS GRAFT  Right 03/27/2018   Procedure: Right lower extremity angiogram, Right Femoral Peroneal artery bypass, Angioplasty Right Femoral Peroneal bypass, Thrombectomy of Femoral peroneal artery;  Surgeon: Waynetta Sandy, MD;  Location: [facility];  Service: Vascular;  Laterality: Right;  . INTRAOPERATIVE ARTERIOGRAM Right 03/26/2018   Procedure: Intra Operative Arteriogram;  Surgeon: Waynetta Sandy, MD;  Location: [facility];  Service: Vascular;  Laterality: Right;  . LOWER EXTREMITY ANGIOGRAPHY Bilateral 03/25/2018   Procedure: LOWER EXTREMITY ANGIOGRAPHY;  Surgeon: Waynetta Sandy, MD;  Location: [facility];  Service: Cardiovascular;  Laterality: Bilateral;  . LUMBAR LAMINECTOMY/DECOMPRESSION MICRODISCECTOMY N/A 09/09/2014   Procedure: L4-L5 DECOMPRESSION ;  Surgeon: Melina Schools, MD;  Location: [facility];  Service: Orthopedics;  Laterality: N/A;  . PATCH ANGIOPLASTY Right 03/26/2018   Procedure: PATCH ANGIOPLASTY;  Surgeon: Waynetta Sandy, MD;  Location: [facility];  Service: Vascular;  Laterality: Right;  . SHOULDER SURGERY    . THYROID SURGERY    . TONSILLECTOMY    . TOTAL THYROIDECTOMY    . VEIN HARVEST Right 03/26/2018   Procedure: Vein Harvest of Right Leg Greater Saphenous Vein in SITU;  Surgeon: Waynetta Sandy, MD;  Location: [facility];  Service: Vascular;  Laterality: Right;    There were no vitals filed for this visit.  Subjective Assessment - 09/26/18 1758    Subjective  Left shoulder is not  as painful today.    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  Yes    Pain Score  2     Pain Location  Shoulder    Pain Orientation  Left    Pain Descriptors / Indicators  Aching    Pain Onset  More than a month ago    Pain Frequency  Intermittent                       [facility]  Adult PT Treatment/Exercise - 09/26/18 0001      Transfers   Transfers  Sit to Stand;Stand to Sit    Sit to Stand  4: Min guard   increased time; steadying assist   Stand to Sit  4: Min guard   increased time; steadying assist   Stand Pivot Transfers  4: Min guard   increased time; steadying assist     Ambulation/Gait   Ambulation/Gait  Yes    Ambulation/Gait Assistance  4: Min guard    Ambulation/Gait Assistance Details  cues for proper step length and sequencing to work towards a step- through pattern.    Ambulation Distance (Feet)  30 Feet   x2   Assistive device  Rolling walker;Prosthesis    Gait Pattern  Step-to pattern;Step-through pattern    Ambulation Surface  Level;Indoor      Prosthetics   Current prosthetic wear tolerance (days/week)   liner only daily.    Current prosthetic wear tolerance (#hours/day)   6-8 hrs 1x/day    Residual limb condition   no issues per pt.    Education Provided  Skin check;Proper Donning;Proper Doffing;Proper wear schedule/adjustment;Proper weight-bearing schedule/adjustment    Person(s) Educated  Patient    Education Method  Explanation;Demonstration    Education Method  Verbalized understanding;Returned demonstration;Needs further instruction    Donning Prosthesis  Supervision    Doffing Prosthesis  Supervision          Balance Exercises - 09/26/18 1859      Balance Exercises: Standing   Standing Eyes Opened  Wide (BOA)   at sink weight shifting laterally, with rotation, A/P, cues for midline awareness       PT Education - 09/26/18 1854    Education Details  Educated pt on progression of gait training with RW and reason for cues.    Person(s) Educated  Patient    Methods  Explanation;Demonstration;Verbal cues    Comprehension  Need further instruction;Verbal cues required       PT Short Term Goals - 09/12/18 1340      PT SHORT TERM GOAL #1   Title  Patient donnes prosthesis including tightening suspension strap in  standing with supervision. (ALL STGs updated Target Date: 10/11/2018)    Time  4    Period  Weeks    Status  On-going    Target Date  10/11/18      PT SHORT TERM GOAL #2   Title  Patient tolerates wearing prosthesis >3hrs/day & liner >8hrs total /day without skin issues.    Time  4    Period  Weeks    Status  On-going    Target Date  10/11/18      PT SHORT TERM GOAL #3   Title  Patient sit to/from stand chairs  with armrests to RW with prosthesis safely with supervision.    Time  4    Period  Weeks    Status  On-going    Target Date  10/11/18      PT SHORT TERM GOAL #4   Title  Patient ambulates 450' with RW & prosthesis with minA.    Time  4    Period  Weeks    Status  On-going    Target Date  10/11/18      PT SHORT TERM GOAL #5   Title  Standing balance with RW support reaches 2" anteriorly & to knee level with supervision.    Time  4    Period  Weeks    Status  On-going    Target Date  10/11/18        PT Long Term Goals - 07/18/18 1137      PT LONG TERM GOAL #1   Title  Patient demonstrates & verbalizes proper prosthetic care to enable safe use of prosthesis.  (All LTGs Target Date: 10/11/2018)    Time  12    Period  Weeks    Status  New    Target Date  10/11/18      PT LONG TERM GOAL #2   Title  Patient tolerates prosthesis wear >80% of awake hours to improve ability to function during her day.    Time  12    Period  Weeks    Status  New    Target Date  10/11/18      PT LONG TERM GOAL #3   Title  Patient standing balance with RW support & prosthesis: static no UE support 1 minute, dynamic RW support scans, reaches 7" anteriorly & manages clothes modified independent.    Time  12    Period  Weeks    Status  New    Target Date  10/11/18      PT LONG TERM GOAL #4   Title  Patient ambulates 200' with RW & prosthesis modifiied independent.    Time  12    Period  Weeks    Status  New    Target Date  10/11/18      PT LONG TERM GOAL #5   Title  Patient  negotiates ramps & curbs with RW & prosthesis modified independent.    Time  12    Period  Weeks    Status  New    Target Date  10/11/18            Plan - 09/26/18 1851    Clinical Impression Statement  Skilled session focused on prosthetic care, standing balance/weight shifting at the sink, and gait training.  Pt progressed to supervision level with donning prosthesis with RW requiring cues for tightening socket strap safely and correctly.  Pt continues to progress slowly toward goals.    Personal Factors and Comorbidities  Age;Comorbidity 3+;Fitness;Past/Current Experience;Time since onset of injury/illness/exacerbation;Other;Transportation   lives alone so limited ability to practice tasks that require assist or supervision   Comorbidities  R TFA, L4-5 decompression sg 2016, DM, HTN,    Examination-Activity Limitations  Bend;Caring for Others;Carry;Locomotion Level;Reach Overhead;Stairs;Stand;Toileting;Transfers    Examination-Participation Restrictions  Community Activity;Church;Meal Prep    Stability/Clinical Decision Making  Evolving/Moderate complexity    Rehab Potential  Good    PT Frequency  2x / week    PT Duration  12 weeks    PT Treatment/Interventions  ADLs/Self Care Home Management;DME Instruction;Gait training;Stair training;Functional mobility  training;Therapeutic activities;Therapeutic exercise;Balance training;Neuromuscular re-education;Patient/family education;Prosthetic Training;Vestibular    PT Next Visit Plan  work towards updated STGs (PT plans to recert at that time), review prosthetic care, gait with RW/prosthesis, continue to work on transfers at sink for pt to be independent with this at home    Consulted and Agree with Plan of Care  Patient       Patient will benefit from skilled therapeutic intervention in order to improve the following deficits and impairments:  Abnormal gait, Decreased activity tolerance, Decreased balance, Decreased endurance, Decreased  knowledge of use of DME, Decreased mobility, Decreased range of motion, Decreased strength, Dizziness, Increased edema, Impaired flexibility, Postural dysfunction, Prosthetic Dependency, Pain, Obesity  Visit Diagnosis: Other abnormalities of gait and mobility  Unsteadiness on feet  Abnormal posture  Muscle weakness (generalized)     Problem List Patient Active Problem List   Diagnosis Date Noted  . PAD (peripheral artery disease) ([facility]) 03/23/2018  . Back pain 09/09/2014  . HYPERLIPIDEMIA 12/18/2006  . RECTAL POLYPS 12/18/2006  . 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 SBO 08/01/2006  . GOITER NOS 03/11/2006  . PERIPHERAL NEUROPATHY 03/11/2006  . HYPERTENSION 03/11/2006  . OSTEOPENIA 03/11/2006  . FOOT DROP 03/11/2006   Hortencia Conradi, PTA  09/26/18, 7:12 PM [facility] [address] [place], [place], [address] Phone: [phone]   Fax:  [phone]  Name: Emma [name] MRN: [id number] Date of Birth: 25-Sep-1945   Progress Note Reporting Period 07/17/2018 to 09/26/2018  See note above for Objective Data and Assessment of Progress/Goals.   Vladimir Faster, PT, DPT PT Specializing in Prosthetics & Orthotics 09/26/18 9:57 PM Phone:  [phone]  Fax:  [phone] [facility] [address] [place], [place] [address]

## 2018-09-27 ENCOUNTER — Encounter

## 2018-09-30 ENCOUNTER — Ambulatory Visit: Payer: Medicare HMO | Admitting: Physical Therapy

## 2018-09-30 ENCOUNTER — Encounter: Payer: Self-pay | Admitting: Physical Therapy

## 2018-09-30 ENCOUNTER — Other Ambulatory Visit: Payer: Self-pay

## 2018-09-30 DIAGNOSIS — M6281 Muscle weakness (generalized): Secondary | ICD-10-CM | POA: Diagnosis not present

## 2018-09-30 DIAGNOSIS — M25651 Stiffness of right hip, not elsewhere classified: Secondary | ICD-10-CM | POA: Diagnosis not present

## 2018-09-30 DIAGNOSIS — R2681 Unsteadiness on feet: Secondary | ICD-10-CM

## 2018-09-30 DIAGNOSIS — R2689 Other abnormalities of gait and mobility: Secondary | ICD-10-CM

## 2018-09-30 DIAGNOSIS — R293 Abnormal posture: Secondary | ICD-10-CM | POA: Diagnosis not present

## 2018-10-01 NOTE — Therapy (Signed)
[facility] [address] [place], [place], [address] Phone: [phone]   Fax:  [phone]  Physical Therapy Treatment  Patient Details  Name: Emma [name] MRN: [id number] Date of Birth: 12/18/1945 Referring Provider (PT): Benito Mccreedy, MD   Encounter Date: 09/30/2018   CLINIC OPERATION CHANGES: [facility] is open at lower capacity following universal masking, social distancing, and patient screening.  The patient's COVID risk of complications score is 4.   PT End of Session - 09/30/18 1422    Visit Number  12    Number of Visits  25    Date for PT Re-Evaluation  10/15/18    Authorization Type  [employer]  Medicare    PT Start Time  1320    PT Stop Time  1405    PT Time Calculation (min)  45 min    Equipment Utilized During Treatment  Gait belt    Activity Tolerance  Patient tolerated treatment well    Behavior During Therapy  WFL for tasks assessed/performed       Past Medical History:  Diagnosis Date  . Arthritis    Back and right shoulder  . Constipation   . Diabetes mellitus    Type II  . Diabetes mellitus without complication ([place])   . Diabetic neuropathy ([place])   . History of blood transfusion   . Hypercholesteremia   . Hypertension   . Hypothyroidism   . Pneumonia   . Thyroid disease     Past Surgical History:  Procedure Laterality Date  . ABDOMINAL AORTOGRAM N/A 03/25/2018   Procedure: ABDOMINAL AORTOGRAM;  Surgeon: Waynetta Sandy, MD;  Location: [facility];  Service: Cardiovascular;  Laterality: N/A;  . ABDOMINAL HYSTERECTOMY    . AMPUTATION Right 03/29/2018   Procedure: AMPUTATION ABOVE KNEE;  Surgeon: Serafina Mitchell, MD;  Location: [facility];  Service: Vascular;  Laterality: Right;  . APPENDECTOMY    . BACK SURGERY    . BYPASS GRAFT FEMORAL-PERONEAL Right 03/26/2018   Procedure: BYPASS GRAFT Campbell Stall;  Surgeon: Waynetta Sandy, MD;   Location: [facility];  Service: Vascular;  Laterality: Right;  . EXPLORATORY LAPAROTOMY     Adhesions removed and appendix  . FEMORAL-POPLITEAL BYPASS GRAFT Right 03/27/2018   Procedure: Right lower extremity angiogram, Right Femoral Peroneal artery bypass, Angioplasty Right Femoral Peroneal bypass, Thrombectomy of Femoral peroneal artery;  Surgeon: Waynetta Sandy, MD;  Location: [place];  Service: Vascular;  Laterality: Right;  . INTRAOPERATIVE ARTERIOGRAM Right 03/26/2018   Procedure: Intra Operative Arteriogram;  Surgeon: Waynetta Sandy, MD;  Location: [facility];  Service: Vascular;  Laterality: Right;  . LOWER EXTREMITY ANGIOGRAPHY Bilateral 03/25/2018   Procedure: LOWER EXTREMITY ANGIOGRAPHY;  Surgeon: Waynetta Sandy, MD;  Location: [facility];  Service: Cardiovascular;  Laterality: Bilateral;  . LUMBAR LAMINECTOMY/DECOMPRESSION MICRODISCECTOMY N/A 09/09/2014   Procedure: L4-L5 DECOMPRESSION ;  Surgeon: Melina Schools, MD;  Location: [facility];  Service: Orthopedics;  Laterality: N/A;  . PATCH ANGIOPLASTY Right 03/26/2018   Procedure: PATCH ANGIOPLASTY;  Surgeon: Waynetta Sandy, MD;  Location: [facility];  Service: Vascular;  Laterality: Right;  . SHOULDER SURGERY    . THYROID SURGERY    . TONSILLECTOMY    . TOTAL THYROIDECTOMY    . VEIN HARVEST Right 03/26/2018   Procedure: Vein Harvest of Right Leg Greater Saphenous Vein in SITU;  Surgeon: Waynetta Sandy, MD;  Location: [facility];  Service: Vascular;  Laterality: Right;  There were no vitals filed for this visit.  Subjective Assessment - 09/30/18 1320    Subjective  She is wearing prosthesis ~3 days per week but only for an hour (when practicing).  No falls.    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  Yes    Pain Score  6      Pain Location  Shoulder    Pain Orientation  Left    Pain Descriptors / Indicators  Throbbing    Pain Type  Chronic pain    Pain Onset  More than a month ago    Pain Frequency  Constant    Aggravating Factors   using left arm like pushing w/c    Pain Relieving Factors  medications                       [facility] Adult PT Treatment/Exercise - 09/30/18 1320      Transfers   Transfers  Sit to Stand;Stand to Sit    Sit to Stand  5: Supervision;With upper extremity assist;From toilet;From chair/3-in-1   increased time; uses RW to steady   Sit to Stand Details (indicate cue type and reason)  cues on bringing prosthesis back into position to enable support once arises.     Stand to Sit  5: Supervision;With upper extremity assist;To chair/3-in-1;To toilet   increased time; steadies with RW   Stand Pivot Transfers  5: Supervision   increased time; RW to steady     Ambulation/Gait   Ambulation/Gait  Yes    Ambulation/Gait Assistance  5: Supervision    Ambulation/Gait Assistance Details  tactile & verbal cues on upright posture, wt shift over prosthesis in stance.     Ambulation Distance (Feet)  30 Feet   x2 in/out of bathroom   Assistive device  Rolling walker;Prosthesis    Gait Pattern  Step-to pattern;Step-through pattern    Ambulation Surface  Level;Indoor    Stairs  Yes    Stairs Assistance  4: Min assist;4: Min guard    Stairs Assistance Details (indicate cue type and reason)  PT demo, tactile & verbal cues on technique with TFA prosthesis    Stair Management Technique  Two rails;Step to pattern;Forwards    Number of Stairs  4    Height of Stairs  6      Self-Care   ADL's  toileting with TFA prosthesis including managing clothes, sit/stand from toilet and washing hands standing with RW.  Pt performed safely with verbal cues & increased time.       Prosthetics   Prosthetic Care Comments   Instructed in need to increase wear including sitting with prosthesis to enable  availability for function throughout her day.  She reports concerns with toileting with prosthesis. So PT had pt ambulate into bathroom (similar to hers with wall on left side when sitting on toilet). Pt able to perform with verbal cues & increased time.  PT recommended donning prosthesis daily immediately after "work from home" on work days & wear until close to bed time.  On non-work days, donne upon arising and wear as long as tolerated with current STG 3hrs at least.    Current prosthetic wear tolerance (days/week)   liner only daily. prosthesis ~3 days/wk    Current  prosthetic wear tolerance (#hours/day)   1hr for practicing    Residual limb condition   no issues per pt.    Education Provided  Skin check;Proper Donning;Proper Doffing;Proper wear schedule/adjustment;Proper weight-bearing schedule/adjustment;Other (comment)   see prosthetic care comments   Person(s) Educated  Patient    Education Method  Explanation;Demonstration;Tactile cues;Verbal cues    Education Method  Verbalized understanding;Returned demonstration;Needs further instruction    Donning Prosthesis  Supervision   verbal cues no physical assist   Doffing Prosthesis  Modified independent (device/increased time)               PT Short Term Goals - 09/12/18 1340      PT SHORT TERM GOAL #1   Title  Patient donnes prosthesis including tightening suspension strap in standing with supervision. (ALL STGs updated [place] Date: 10/11/2018)    Time  4    Period  Weeks    Status  On-going    Target Date  10/11/18      PT SHORT TERM GOAL #2   Title  Patient tolerates wearing prosthesis >3hrs/day & liner >8hrs total /day without skin issues.    Time  4    Period  Weeks    Status  On-going    Target Date  10/11/18      PT SHORT TERM GOAL #3   Title  Patient sit to/from stand chairs with armrests to RW with prosthesis safely with supervision.    Time  4    Period  Weeks    Status  On-going    Target Date  10/11/18       PT SHORT TERM GOAL #4   Title  Patient ambulates 450' with RW & prosthesis with minA.    Time  4    Period  Weeks    Status  On-going    Target Date  10/11/18      PT SHORT TERM GOAL #5   Title  Standing balance with RW support reaches 2" anteriorly & to knee level with supervision.    Time  4    Period  Weeks    Status  On-going    Target Date  10/11/18        PT Long Term Goals - 07/18/18 1137      PT LONG TERM GOAL #1   Title  Patient demonstrates & verbalizes proper prosthetic care to enable safe use of prosthesis.  (All LTGs Target Date: 10/11/2018)    Time  12    Period  Weeks    Status  New    Target Date  10/11/18      PT LONG TERM GOAL #2   Title  Patient tolerates prosthesis wear >80% of awake hours to improve ability to function during her day.    Time  12    Period  Weeks    Status  New    Target Date  10/11/18      PT LONG TERM GOAL #3   Title  Patient standing balance with RW support & prosthesis: static no UE support 1 minute, dynamic RW support scans, reaches 7" anteriorly & manages clothes modified independent.    Time  12    Period  Weeks    Status  New    Target Date  10/11/18      PT LONG TERM GOAL #4   Title  Patient ambulates 200' with RW & prosthesis modifiied independent.    Time  12    Period  Weeks    Status  New    Target Date  10/11/18      PT LONG TERM GOAL #5   Title  Patient negotiates ramps & curbs with RW & prosthesis modified independent.    Time  12    Period  Weeks    Status  New    Target Date  10/11/18            Plan - 09/30/18 2039    Clinical Impression Statement  Session focused on educating patient on need to increase prosthetic wear to improve function, toileting with prosthesis on limb and PT introduced steps with 2 rails today.    Personal Factors and Comorbidities  Age;Comorbidity 3+;Fitness;Past/Current Experience;Time since onset of injury/illness/exacerbation;Other;Transportation   lives alone so  limited ability to practice tasks that require assist or supervision   Comorbidities  R TFA, L4-5 decompression sg 2016, DM, HTN,    Examination-Activity Limitations  Bend;Caring for Others;Carry;Locomotion Level;Reach Overhead;Stairs;Stand;Toileting;Transfers    Examination-Participation Restrictions  Community Activity;Church;Meal Prep    Stability/Clinical Decision Making  Evolving/Moderate complexity    Rehab Potential  Good    PT Frequency  2x / week    PT Duration  12 weeks    PT Treatment/Interventions  ADLs/Self Care Home Management;DME Instruction;Gait training;Stair training;Functional mobility training;Therapeutic activities;Therapeutic exercise;Balance training;Neuromuscular re-education;Patient/family education;Prosthetic Training;Vestibular    PT Next Visit Plan  work towards updated STGs (PT plans to recert at that time), review prosthetic care / progressing wear, gait with RW/prosthesis, continue to work on transfers at sink for pt to be independent with this at home    Consulted and Agree with Plan of Care  Patient       Patient will benefit from skilled therapeutic intervention in order to improve the following deficits and impairments:  Abnormal gait, Decreased activity tolerance, Decreased balance, Decreased endurance, Decreased knowledge of use of DME, Decreased mobility, Decreased range of motion, Decreased strength, Dizziness, Increased edema, Impaired flexibility, Postural dysfunction, Prosthetic Dependency, Pain, Obesity  Visit Diagnosis: Other abnormalities of gait and mobility  Unsteadiness on feet  Abnormal posture  Muscle weakness (generalized)  Stiffness of right hip, not elsewhere classified     Problem List Patient Active Problem List   Diagnosis Date Noted  . PAD (peripheral artery disease) (HCC) 03/23/2018  . Back pain 09/09/2014  . HYPERLIPIDEMIA 12/18/2006  . RECTAL POLYPS 12/18/2006  . 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 SBO 08/01/2006  . GOITER NOS  03/11/2006  . PERIPHERAL NEUROPATHY 03/11/2006  . HYPERTENSION 03/11/2006  . OSTEOPENIA 03/11/2006  . FOOT DROP 03/11/2006    Aveion Nguyen PT, DPT 10/01/2018, 10:41 AM  [facility] [address] [place], [place], [address] Phone: [phone]   Fax:  [phone]  Name: Zahira Folks MRN: [id number] Date of Birth: 06-02-1945

## 2018-10-02 ENCOUNTER — Other Ambulatory Visit: Payer: Self-pay

## 2018-10-02 ENCOUNTER — Encounter: Payer: Self-pay | Admitting: Physical Therapy

## 2018-10-02 ENCOUNTER — Ambulatory Visit: Payer: Medicare HMO | Admitting: Physical Therapy

## 2018-10-02 DIAGNOSIS — M6281 Muscle weakness (generalized): Secondary | ICD-10-CM | POA: Diagnosis not present

## 2018-10-02 DIAGNOSIS — R2681 Unsteadiness on feet: Secondary | ICD-10-CM

## 2018-10-02 DIAGNOSIS — M25651 Stiffness of right hip, not elsewhere classified: Secondary | ICD-10-CM

## 2018-10-02 DIAGNOSIS — R293 Abnormal posture: Secondary | ICD-10-CM | POA: Diagnosis not present

## 2018-10-02 DIAGNOSIS — R2689 Other abnormalities of gait and mobility: Secondary | ICD-10-CM

## 2018-10-03 NOTE — Therapy (Signed)
[facility], [facility] [address] [place], [place], [address] Phone: [phone]   Fax:  [phone]  Physical Therapy Treatment  Patient Details  Name: Emma [name] MRN: [id number] Date of Birth: 04/02/1945 Referring Provider (PT): Jackie Plum, MD   Encounter Date: 10/02/2018   CLINIC OPERATION CHANGES: Outpatient [facility] Rehab is open at lower capacity following universal masking, social distancing, and patient screening.  The patient's COVID risk of complications score is 4.   PT End of Session - 10/02/18 1838    Visit Number  13    Number of Visits  25    Date for PT Re-Evaluation  10/15/18    Authorization Type  [employer]  Medicare    PT Start Time  1700    PT Stop Time  1744    PT Time Calculation (min)  44 min    Equipment Utilized During Treatment  Gait belt    Activity Tolerance  Patient tolerated treatment well    Behavior During Therapy  WFL for tasks assessed/performed       Past Medical History:  Diagnosis Date  . Arthritis    Back and right shoulder  . Constipation   . Diabetes mellitus    Type II  . Diabetes mellitus without complication (HCC)   . Diabetic neuropathy (HCC)   . History of blood transfusion   . Hypercholesteremia   . Hypertension   . Hypothyroidism   . Pneumonia   . Thyroid disease     Past Surgical History:  Procedure Laterality Date  . ABDOMINAL AORTOGRAM N/A 03/25/2018   Procedure: ABDOMINAL AORTOGRAM;  Surgeon: Maeola Harman, MD;  Location: [facility] INVASIVE CV LAB;  Service: Cardiovascular;  Laterality: N/A;  . ABDOMINAL HYSTERECTOMY    . AMPUTATION Right 03/29/2018   Procedure: AMPUTATION ABOVE KNEE;  Surgeon: Nada Libman, MD;  Location: [facility] OR;  Service: Vascular;  Laterality: Right;  . APPENDECTOMY    . BACK SURGERY    . BYPASS GRAFT FEMORAL-PERONEAL Right 03/26/2018   Procedure: BYPASS GRAFT [place];  Surgeon: Maeola Harman, MD;   [facility] OR;  Service: Vascular;  Laterality: Right;  . EXPLORATORY LAPAROTOMY     Adhesions removed and appendix  . FEMORAL-POPLITEAL BYPASS GRAFT Right 03/27/2018   Procedure: Right lower extremity angiogram, Right Femoral Peroneal artery bypass, Angioplasty Right Femoral Peroneal bypass, Thrombectomy of Femoral peroneal artery;  Surgeon: Maeola Harman, MD;  Location: [facility] OR;  Service: Vascular;  Laterality: Right;  . INTRAOPERATIVE ARTERIOGRAM Right 03/26/2018   Procedure: Intra Operative Arteriogram;  Surgeon: Maeola Harman, MD;  Location: [facility] OR;  Service: Vascular;  Laterality: Right;  . LOWER EXTREMITY ANGIOGRAPHY Bilateral 03/25/2018   Procedure: LOWER EXTREMITY ANGIOGRAPHY;  Surgeon: Maeola Harman, MD;  Location: [facility] INVASIVE CV LAB;  Service: Cardiovascular;  Laterality: Bilateral;  . LUMBAR LAMINECTOMY/DECOMPRESSION MICRODISCECTOMY N/A 09/09/2014   Procedure: L4-L5 DECOMPRESSION ;  Surgeon: Venita Lick, MD;  Location: [facility] OR;  Service: Orthopedics;  Laterality: N/A;  . PATCH ANGIOPLASTY Right 03/26/2018   Procedure: PATCH ANGIOPLASTY;  Surgeon: Maeola Harman, MD;  Location: [facility] OR;  Service: Vascular;  Laterality: Right;  . SHOULDER SURGERY    . THYROID SURGERY    . TONSILLECTOMY    . TOTAL THYROIDECTOMY    . VEIN HARVEST Right 03/26/2018   Procedure: Vein Harvest of Right Leg Greater Saphenous Vein in SITU;  Surgeon: Maeola Harman, MD;  Location: [facility] OR;  Service: Vascular;  Laterality: Right;  There were no vitals filed for this visit.  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  No/denies    Pain Onset  More than a month ago                        [facility] Adult PT Treatment/Exercise - 10/02/18 1700      Transfers   Transfers  Sit to Stand;Stand to Sit    Sit to Stand  5: Supervision;With upper extremity assist;From toilet;From chair/3-in-1   increased time; uses RW to steady   Stand to Sit  5: Supervision;With upper extremity assist;To chair/3-in-1;To toilet   increased time; steadies with RW   Stand Pivot Transfers  5: Supervision   increased time; RW to steady     Ambulation/Gait   Ambulation/Gait  Yes    Ambulation/Gait Assistance  5: Supervision    Ambulation/Gait Assistance Details  demo, tactile & verbal cues on posture/RW movement distance, initial contact with heel without delay, wt shift over prosthesis in stance and LLE step thru pattern.     Ambulation Distance (Feet)  35 Feet   35' & 25'   Assistive device  Rolling walker;Prosthesis    Gait Pattern  Step-to pattern;Step-through pattern    Stairs  --    Stairs Assistance  --    Stair Management Technique  --    Number of Stairs  --    Height of Stairs  --    Curb  3: Mod assist   2nd person for safety & pt anxiety   Curb Details (indicate cue type and reason)  demo, verbal & manual cues on technique with RW & TFA prosthesis      Self-Care   ADL's  reviewed need to work on toileting with prosthesis to enable wear more during the day.       Prosthetics   Prosthetic Care Comments   Reviewed need to increase wear including sitting with prosthesis to enable availability for function throughout her day.  She continues to reports concerns with toileting with prosthesis.    Current prosthetic wear tolerance (days/week)   liner only daily. prosthesis ~3 days/wk    Current prosthetic wear tolerance (#hours/day)   1hr for practicing    Residual limb condition   no issues per pt.    Education Provided  Skin check;Proper Donning;Proper Doffing;Proper wear schedule/adjustment;Proper weight-bearing schedule/adjustment;Other (comment)   see  prosthetic care comments   Person(s) Educated  Patient    Education Method  Explanation;Verbal cues    Education Method  Verbalized understanding;Verbal cues required;Needs further instruction    Donning Prosthesis  Supervision    Doffing Prosthesis  Modified independent (device/increased time)               PT Short Term Goals - 09/12/18 1340      PT SHORT TERM GOAL #1   Title  Patient donnes prosthesis including tightening suspension strap in standing with supervision. (ALL STGs updated Target Date: 10/11/2018)    Time  4    Period  Weeks    Status  On-going    Target Date  10/11/18  PT SHORT TERM GOAL #2   Title  Patient tolerates wearing prosthesis >3hrs/day & liner >8hrs total /day without skin issues.    Time  4    Period  Weeks    Status  On-going    Target Date  10/11/18      PT SHORT TERM GOAL #3   Title  Patient sit to/from stand chairs with armrests to RW with prosthesis safely with supervision.    Time  4    Period  Weeks    Status  On-going    Target Date  10/11/18      PT SHORT TERM GOAL #4   Title  Patient ambulates 6050' with RW & prosthesis with minA.    Time  4    Period  Weeks    Status  On-going    Target Date  10/11/18      PT SHORT TERM GOAL #5   Title  Standing balance with RW support reaches 2" anteriorly & to knee level with supervision.    Time  4    Period  Weeks    Status  On-going    Target Date  10/11/18        PT Long Term Goals - 07/18/18 1137      PT LONG TERM GOAL #1   Title  Patient demonstrates & verbalizes proper prosthetic care to enable safe use of prosthesis.  (All LTGs Target Date: 10/11/2018)    Time  12    Period  Weeks    Status  New    Target Date  10/11/18      PT LONG TERM GOAL #2   Title  Patient tolerates prosthesis wear >80% of awake hours to improve ability to function during her day.    Time  12    Period  Weeks    Status  New    Target Date  10/11/18      PT LONG TERM GOAL #3   Title   Patient standing balance with RW support & prosthesis: static no UE support 1 minute, dynamic RW support scans, reaches 7" anteriorly & manages clothes modified independent.    Time  12    Period  Weeks    Status  New    Target Date  10/11/18      PT LONG TERM GOAL #4   Title  Patient ambulates 200' with RW & prosthesis modifiied independent.    Time  12    Period  Weeks    Status  New    Target Date  10/11/18      PT LONG TERM GOAL #5   Title  Patient negotiates ramps & curbs with RW & prosthesis modified independent.    Time  12    Period  Weeks    Status  New    Target Date  10/11/18            Plan - 10/02/18 1808    Clinical Impression Statement  Today's session focused on increasing distance & quality of prosthetic gait.  PT also instructed in negotiating curbs with prosthesis & RW for first time today.  [facility] her to attend 2x/wk. If she will increase wear, it should help carryover for more functional gains.    Personal Factors and Comorbidities  Age;Comorbidity 3+;Fitness;Past/Current Experience;Time since onset of injury/illness/exacerbation;Other;Transportation   lives alone so limited ability to practice tasks that require assist or supervision   Comorbidities  R TFA, L4-5 decompression sg 2016, DM, HTN,    Examination-Activity Limitations  Bend;Caring for Others;Carry;Locomotion Level;Reach Overhead;Stairs;Stand;Toileting;Transfers    Examination-Participation Restrictions  Community Activity;Church;Meal Prep    Stability/Clinical Decision Making  Evolving/Moderate complexity    Rehab Potential  Good    PT Frequency  2x / week    PT Duration  12 weeks    PT Treatment/Interventions  ADLs/Self Care Home Management;DME Instruction;Gait training;Stair training;Functional mobility training;Therapeutic activities;Therapeutic exercise;Balance training;Neuromuscular re-education;Patient/family education;Prosthetic  Training;Vestibular    PT Next Visit Plan  check STGs & PT to recert at that time, review prosthetic care / progressing wear, gait with RW/prosthesis, continue to work on transfers at sink for pt to be independent with this at home    Consulted and Agree with Plan of Care  Patient       Patient will benefit from skilled therapeutic intervention in order to improve the following deficits and impairments:  Abnormal gait, Decreased activity tolerance, Decreased balance, Decreased endurance, Decreased knowledge of use of DME, Decreased mobility, Decreased range of motion, Decreased strength, Dizziness, Increased edema, Impaired flexibility, Postural dysfunction, Prosthetic Dependency, Pain, Obesity  Visit Diagnosis: Unsteadiness on feet  Other abnormalities of gait and mobility  Abnormal posture  Muscle weakness (generalized)  Stiffness of right hip, not elsewhere classified     Problem List Patient Active Problem List   Diagnosis Date Noted  . PAD (peripheral artery disease) (HCC) 03/23/2018  . Back pain 09/09/2014  . HYPERLIPIDEMIA 12/18/2006  . RECTAL POLYPS 12/18/2006  . 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 SBO 08/01/2006  . GOITER NOS 03/11/2006  . PERIPHERAL NEUROPATHY 03/11/2006  . HYPERTENSION 03/11/2006  . OSTEOPENIA 03/11/2006  . FOOT DROP 03/11/2006    Vladimir FasterWALDRON,Sukari Grist PT, DPT 10/03/2018, 6:11 PM  [facility] [address] [place], [place], [address] Phone: [phone]   Fax:  [phone]  Name: Abe PeopleDeitra Irigoyen MRN: [id number] Date of Birth: September 09, 1945

## 2018-10-07 ENCOUNTER — Encounter: Payer: Self-pay | Admitting: Physical Therapy

## 2018-10-07 ENCOUNTER — Other Ambulatory Visit: Payer: Self-pay

## 2018-10-07 ENCOUNTER — Ambulatory Visit: Payer: Medicare HMO | Admitting: Physical Therapy

## 2018-10-07 DIAGNOSIS — R2681 Unsteadiness on feet: Secondary | ICD-10-CM | POA: Diagnosis not present

## 2018-10-07 DIAGNOSIS — R2689 Other abnormalities of gait and mobility: Secondary | ICD-10-CM

## 2018-10-07 DIAGNOSIS — M25651 Stiffness of right hip, not elsewhere classified: Secondary | ICD-10-CM | POA: Diagnosis not present

## 2018-10-07 DIAGNOSIS — M6281 Muscle weakness (generalized): Secondary | ICD-10-CM | POA: Diagnosis not present

## 2018-10-07 DIAGNOSIS — R293 Abnormal posture: Secondary | ICD-10-CM

## 2018-10-07 NOTE — Therapy (Addendum)
[facility] [address] [place], [place], [address] Phone: [phone]   Fax:  [phone]  Physical Therapy Treatment & Recertification  Patient Details  Name: Emma [name] MRN: [id number] Date of Birth: 1945/04/19 Referring Provider (PT): Benito Mccreedy, MD  See PT note at bottom for Recertification information  Encounter Date: 10/07/2018  PT End of Session - 10/07/18 1322    Visit Number  14    Number of Visits  25    Date for PT Re-Evaluation  10/15/18    Authorization Type  [employer]  Medicare    PT Start Time  2637    PT Stop Time  1358    PT Time Calculation (min)  41 min    Equipment Utilized During Treatment  Gait belt    Activity Tolerance  Patient tolerated treatment well    Behavior During Therapy  [facility] for tasks assessed/performed       Past Medical History:  Diagnosis Date  . Arthritis    Back and right shoulder  . Constipation   . Diabetes mellitus    Type II  . Diabetes mellitus without complication ([place])   . Diabetic neuropathy ([place])   . History of blood transfusion   . Hypercholesteremia   . Hypertension   . Hypothyroidism   . Pneumonia   . Thyroid disease     Past Surgical History:  Procedure Laterality Date  . ABDOMINAL AORTOGRAM N/A 03/25/2018   Procedure: ABDOMINAL AORTOGRAM;  Surgeon: Waynetta Sandy, MD;  Location: [place] [facility];  Service: Cardiovascular;  Laterality: N/A;  . ABDOMINAL HYSTERECTOMY    . AMPUTATION Right 03/29/2018   Procedure: AMPUTATION ABOVE KNEE;  Surgeon: Serafina Mitchell, MD;  Location: [place];  Service: Vascular;  Laterality: Right;  . APPENDECTOMY    . BACK SURGERY    . BYPASS GRAFT FEMORAL-PERONEAL Right 03/26/2018   Procedure: BYPASS GRAFT [place];  Surgeon: Waynetta Sandy, MD;  Location: [place];  Service: Vascular;  Laterality: Right;  . EXPLORATORY LAPAROTOMY     Adhesions removed and appendix  .  FEMORAL-POPLITEAL BYPASS GRAFT Right 03/27/2018   Procedure: Right lower extremity angiogram, Right Femoral Peroneal artery bypass, Angioplasty Right Femoral Peroneal bypass, Thrombectomy of Femoral peroneal artery;  Surgeon: Waynetta Sandy, MD;  Location: [place];  Service: Vascular;  Laterality: Right;  . INTRAOPERATIVE ARTERIOGRAM Right 03/26/2018   Procedure: Intra Operative Arteriogram;  Surgeon: Waynetta Sandy, MD;  Location: [place];  Service: Vascular;  Laterality: Right;  . LOWER EXTREMITY ANGIOGRAPHY Bilateral 03/25/2018   Procedure: LOWER EXTREMITY ANGIOGRAPHY;  Surgeon: Waynetta Sandy, MD;  Location: [facility] [facility];  Service: Cardiovascular;  Laterality: Bilateral;  . LUMBAR LAMINECTOMY/DECOMPRESSION MICRODISCECTOMY N/A 09/09/2014   Procedure: L4-L5 DECOMPRESSION ;  Surgeon: Melina Schools, MD;  Location: [facility];  Service: Orthopedics;  Laterality: N/A;  . PATCH ANGIOPLASTY Right 03/26/2018   Procedure: PATCH ANGIOPLASTY;  Surgeon: Waynetta Sandy, MD;  Location: [facility];  Service: Vascular;  Laterality: Right;  . SHOULDER SURGERY    . THYROID SURGERY    . TONSILLECTOMY    . TOTAL THYROIDECTOMY    . VEIN HARVEST Right 03/26/2018   Procedure: Vein Harvest of Right Leg Greater Saphenous Vein in SITU;  Surgeon: Waynetta Sandy, MD;  Location: [facility];  Service: Vascular;  Laterality: Right;    There were no vitals filed for this visit.  Subjective Assessment - 10/07/18 1320    Subjective  Has not  worn the prosthesis except for 2 times for 1-1.5 hours each since last visit. No falls or pain to report.    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  No/denies    Pain Score  0-No pain           [facility] Adult PT Treatment/Exercise - 10/07/18 1324      Transfers   Transfers   Sit to Stand;Stand to Sit    Sit to Stand  5: Supervision;With upper extremity assist;From toilet;From chair/3-in-1    Stand to Sit  5: Supervision;With upper extremity assist;To chair/3-in-1;To toilet      Ambulation/Gait   Ambulation/Gait  Yes    Ambulation/Gait Assistance  5: Supervision;4: Min guard    Ambulation/Gait Assistance Details  verbal cues for walker proximity, posture and step placement.     Ambulation Distance (Feet)  50 Feet   x2   Assistive device  Rolling walker;Prosthesis    Gait Pattern  Step-to pattern;Step-through pattern    Ambulation Surface  Level;Indoor      Therapeutic Activites    Therapeutic Activities  Other Therapeutic Activities    Other Therapeutic Activities  standing with RW support: supervision for static standing for 2 minutes with supervision; pt able to reach forward 3-4 inches out of base of support with single UE support on walker and able to reach to knee with single UE support on walker, all with supervision.       Prosthetics   Prosthetic Care Comments   Frank/direct discussion on need to wear prosthesis more at home- daily wear for more hours as this topic has been broached in last 2 sessions by PT with no carryover to date. Discussed that 2x a week with [place] and 2 hours ouside of therapy will not get pt to where her goals are. Pt not receptive to conversation, stated '"you don't have to talk like that"    Current prosthetic wear tolerance (days/week)   liner only daily. prosthesis 2 days/wk this past week    Current prosthetic wear tolerance (#hours/day)   1hr for practicing    Residual limb condition   no issues per pt.    Education Provided  Proper wear schedule/adjustment;Proper weight-bearing schedule/adjustment;Proper Donning;Residual limb care    Person(s) Educated  Patient    Education Method  Explanation;Demonstration;Verbal cues    Education Method  Verbalized understanding;Returned demonstration;Verbal cues required;Needs further  instruction    Donning Prosthesis  Supervision;Minimal assist    Doffing Prosthesis  Modified independent (device/increased time)          PT Short Term Goals - 10/07/18 1322      PT SHORT TERM GOAL #1   Title  Patient donnes prosthesis including tightening suspension strap in standing with supervision. (ALL STGs updated Target Date: 10/11/2018)    Baseline  10/07/18: needed assist to place prosthesis today due to long pants with tight elasatic waist. able to thread strap and tighten in standing    Time  --    Period  --    Status  Partially Met    Target Date  --      PT SHORT TERM GOAL #2   Title  Patient tolerates wearing prosthesis >3hrs/day & liner >8hrs total /day without skin issues.    Baseline  10/07/18: wearing liner "  sometimes" 8 hours a day, does not wear prosthesis daily-1-2 days a week for 1-2 hour at most.    Status  Partially Met    Target Date  --      PT SHORT TERM GOAL #3   Title  Patient sit to/from stand chairs with armrests to RW with prosthesis safely with supervision.    Baseline  10/07/18: met in today's session    Time  --    Period  --    Status  Achieved    Target Date  --      PT SHORT TERM GOAL #4   Title  Patient ambulates 60' with RW & prosthesis with minA.    Baseline  10/07/18: met today with min guard assist    Time  --    Period  --    Status  Achieved    Target Date  10/11/18      PT SHORT TERM GOAL #5   Title  Standing balance with RW support reaches 2" anteriorly & to knee level with supervision.    Baseline  10/07/18: met today in session    Time  --    Period  --    Status  Achieved    Target Date  10/11/18        PT Long Term Goals - 07/18/18 1137      PT LONG TERM GOAL #1   Title  Patient demonstrates & verbalizes proper prosthetic care to enable safe use of prosthesis.  (All LTGs Target Date: 10/11/2018)    Time  12    Period  Weeks    Status  New    Target Date  10/11/18      PT LONG TERM GOAL #2   Title  Patient  tolerates prosthesis wear >80% of awake hours to improve ability to function during her day.    Time  12    Period  Weeks    Status  New    Target Date  10/11/18      PT LONG TERM GOAL #3   Title  Patient standing balance with RW support & prosthesis: static no UE support 1 minute, dynamic RW support scans, reaches 7" anteriorly & manages clothes modified independent.    Time  12    Period  Weeks    Status  New    Target Date  10/11/18      PT LONG TERM GOAL #4   Title  Patient ambulates 200' with RW & prosthesis modifiied independent.    Time  12    Period  Weeks    Status  New    Target Date  10/11/18      PT LONG TERM GOAL #5   Title  Patient negotiates ramps & curbs with RW & prosthesis modified independent.    Time  12    Period  Weeks    Status  New    Target Date  10/11/18            Plan - 10/07/18 1322    Clinical Impression Statement  Today's skilled session focused on progress toward goals with 2 goals partially met, others fully met. Pt continues to be very limited in use/wear of prosthesis outside of PT sessions. This topic has been broached with her several times by primary PT and again in today's session. Requested pt to wear prosthesis daily for at least 2 hours each day at this time while seated in wheelchiar. Pt not  very receptive to conversation. Most likley will need reinforcement of this concept.    Personal Factors and Comorbidities  Age;Comorbidity 3+;Fitness;Past/Current Experience;Time since onset of injury/illness/exacerbation;Other;Transportation   lives alone so limited ability to practice tasks that require assist or supervision   Comorbidities  R TFA, L4-5 decompression sg 2016, DM, HTN,    Examination-Activity Limitations  Bend;Caring for Others;Carry;Locomotion Level;Reach Overhead;Stairs;Stand;Toileting;Transfers    Examination-Participation Restrictions  Community Activity;Church;Meal Prep    Stability/Clinical Decision Making   Evolving/Moderate complexity    Rehab Potential  Good    PT Frequency  2x / week    PT Duration  12 weeks    PT Treatment/Interventions  ADLs/Self Care Home Management;DME Instruction;Gait training;Stair training;Functional mobility training;Therapeutic activities;Therapeutic exercise;Balance training;Neuromuscular re-education;Patient/family education;Prosthetic Training;Vestibular    PT Next Visit Plan  PT to recert; review prosthetic care / progressing wear, gait with RW/prosthesis, continue to work on transfers at sink for pt to be independent with this at home    Consulted and Agree with Plan of Care  Patient       Patient will benefit from skilled therapeutic intervention in order to improve the following deficits and impairments:  Abnormal gait, Decreased activity tolerance, Decreased balance, Decreased endurance, Decreased knowledge of use of DME, Decreased mobility, Decreased range of motion, Decreased strength, Dizziness, Increased edema, Impaired flexibility, Postural dysfunction, Prosthetic Dependency, Pain, Obesity  Visit Diagnosis: Unsteadiness on feet  Other abnormalities of gait and mobility  Abnormal posture  Muscle weakness (generalized)  Stiffness of right hip    Problem List Patient Active Problem List   Diagnosis Date Noted  . PAD (peripheral artery disease) ([place]) 03/23/2018  . Back pain 09/09/2014  . HYPERLIPIDEMIA 12/18/2006  . RECTAL POLYPS 12/18/2006  . 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 SBO 08/01/2006  . GOITER NOS 03/11/2006  . PERIPHERAL NEUROPATHY 03/11/2006  . HYPERTENSION 03/11/2006  . OSTEOPENIA 03/11/2006  . FOOT DROP 03/11/2006    Willow Ora, PTA, [facility] [address], [place], [place] [address] [phone] 10/07/18, 2:26 PM   Name: Emma [name] MRN: [id number] Date of Birth: 1945/06/05   PT End of Session - 10/08/18 0839    Visit Number  14    Number of Visits  38    Date for PT Re-Evaluation  01/03/19     Authorization Type  HUMANA  Medicare      PT Short Term Goals - 10/08/18 0842      PT SHORT TERM GOAL #1   Title  Patient transfers stand pivot with RW between chair with armrests & chair without armrest using UEs with supervision. (ALL STGs updated Target Date:11/08/2018)    Time  4    Period  Weeks    Status  New    Target Date  11/08/18      PT SHORT TERM GOAL #2   Title  Patient tolerates wearing prosthesis >/= 5 day/wk  >3hrs/day & liner >8hrs total /day without skin issues.    Baseline  --    Time  4    Period  Weeks    Status  Revised    Target Date  11/08/18      PT SHORT TERM GOAL #3   Title  Patient demonstrates ability to toilet with prosthesis including managing pants & sit/stand with supervision.    Baseline  --    Time  4    Period  Weeks    Status  New    Target Date  11/08/18  PT SHORT TERM GOAL #4   Title  Patient ambulates 50' with RW & prosthesis with supervision.    Baseline  --    Time  4    Period  Weeks    Status  Revised    Target Date  11/08/18      PT SHORT TERM GOAL #5   Title  Standing balance with RW support reaches 5" anteriorly & pick up item from floor with supervision.    Baseline  --    Time  4    Period  Weeks    Status  Revised    Target Date  11/08/18      Additional Short Term Goals   Additional Short Term Goals  Yes      PT SHORT TERM GOAL #6   Title  Patient negotiates ramps & curbs with RW & prosthesis with minimal assist.    Time  4    Period  Weeks    Status  New    Target Date  11/08/18      PT Long Term Goals - 10/08/18 0841      PT LONG TERM GOAL #1   Title  Patient demonstrates & verbalizes proper prosthetic care to enable safe use of prosthesis.  (All LTGs Target Date: 01/03/2019)    Time  12    Period  Weeks    Status  On-going    Target Date  01/03/19      PT LONG TERM GOAL #2   Title  Patient tolerates prosthesis wear >80% of awake hours to improve ability to function during her day.    Time   12    Period  Weeks    Status  On-going    Target Date  01/03/19      PT LONG TERM GOAL #3   Title  Patient standing balance with RW support & prosthesis: static no UE support 1 minute, dynamic RW support scans, reaches 7" anteriorly & manages clothes modified independent.    Time  12    Period  Weeks    Status  On-going    Target Date  01/03/19      PT LONG TERM GOAL #4   Title  Patient ambulates 200' with RW & prosthesis modifiied independent.    Time  12    Period  Weeks    Status  On-going    Target Date  01/03/19      PT LONG TERM GOAL #5   Title  Patient negotiates ramps & curbs with RW & prosthesis modified independent.    Time  12    Period  Weeks    Status  On-going    Target Date  01/03/19      Plan - 10/08/18 0849    Clinical Impression Statement  Patient is making progress but very slowly due to limited ability to use prosthesis outside of PT. Initially she had transportation issues from [employer] to [place] for PT and limited attendance slowed her progress. For last 30 days [facility] transportation services have been utilized and improved attendance has improved progress. However patient is still hesistant to wear prosthesis at home unless her daughter is there. Patient works from home 40hrs/wk & lives alone. Her daughter also works so limited ability to assist daily. Patient limits wear which is first step to prosthesis becoming functional. She has potential to use prosthesis for community based activities with further PT.    Personal Factors and Comorbidities  Age;Comorbidity  3+;Fitness;Past/Current Experience;Time since onset of injury/illness/exacerbation;Other;Transportation   lives alone so limited ability to practice tasks that require assist or supervision   Comorbidities  R TFA, L4-5 decompression sg 2016, DM, HTN,    Examination-Activity Limitations  Bend;Caring for Others;Carry;Locomotion Level;Reach Overhead;Stairs;Stand;Toileting;Transfers     Examination-Participation Restrictions  Community Activity;Church;Meal Prep    Stability/Clinical Decision Making  Evolving/Moderate complexity    Rehab Potential  Good    PT Frequency  2x / week    PT Duration  12 weeks    PT Treatment/Interventions  ADLs/Self Care Home Management;DME Instruction;Gait training;Stair training;Functional mobility training;Therapeutic activities;Therapeutic exercise;Balance training;Neuromuscular re-education;Patient/family education;Prosthetic Training;Vestibular;Passive range of motion;Manual techniques    PT Next Visit Plan  work towards updated STGs on recertification    Consulted and Agree with Plan of Care  Patient      Jamey Reas, PT, DPT PT Specializing in [place] 10/08/18 9:04 AM Phone:  [phone]  Fax:  [phone] [facility] [address] [place], [place] [address]

## 2018-10-08 NOTE — Addendum Note (Signed)
Addended by: Isaias Cowman on: 10/08/2018 09:06 AM   Modules accepted: Orders

## 2018-10-09 ENCOUNTER — Encounter: Payer: Medicare HMO | Admitting: Physical Therapy

## 2018-10-11 ENCOUNTER — Encounter

## 2018-10-14 ENCOUNTER — Other Ambulatory Visit: Payer: Self-pay

## 2018-10-14 ENCOUNTER — Ambulatory Visit: Payer: Medicare HMO | Admitting: Physical Therapy

## 2018-10-14 ENCOUNTER — Encounter: Payer: Self-pay | Admitting: Physical Therapy

## 2018-10-14 DIAGNOSIS — R2689 Other abnormalities of gait and mobility: Secondary | ICD-10-CM

## 2018-10-14 DIAGNOSIS — M6281 Muscle weakness (generalized): Secondary | ICD-10-CM

## 2018-10-14 DIAGNOSIS — R293 Abnormal posture: Secondary | ICD-10-CM | POA: Diagnosis not present

## 2018-10-14 DIAGNOSIS — R2681 Unsteadiness on feet: Secondary | ICD-10-CM | POA: Diagnosis not present

## 2018-10-14 DIAGNOSIS — M25651 Stiffness of right hip, not elsewhere classified: Secondary | ICD-10-CM | POA: Diagnosis not present

## 2018-10-15 NOTE — Therapy (Signed)
[facility] [address] [place], [place], [address] Phone: [phone]   Fax:  [phone]  Physical Therapy Treatment  Patient Details  Name: Emma PeopleDeitra Sistrunk MRN: [id number] Date of Birth: 11-Jun-1945 Referring Provider (PT): Jackie PlumGeorge Osei-Bonsu, MD   Encounter Date: 10/14/2018   CLINIC OPERATION CHANGES: Outpatient Neuro Rehab is open at lower capacity following universal masking, social distancing, and patient screening.  The patient's COVID risk of complications score is 4.   PT End of Session - 10/14/18 1320    Visit Number  15    Number of Visits  25    Date for PT Re-Evaluation  10/15/18    Authorization Type  [employer]  Medicare    PT Start Time  1222    PT Stop Time  1310    PT Time Calculation (min)  48 min    Equipment Utilized During Treatment  Gait belt    Activity Tolerance  Patient tolerated treatment well    Behavior During Therapy  WFL for tasks assessed/performed       Past Medical History:  Diagnosis Date  . Arthritis    Back and right shoulder  . Constipation   . Diabetes mellitus    Type II  . Diabetes mellitus without complication (HCC)   . Diabetic neuropathy (HCC)   . History of blood transfusion   . Hypercholesteremia   . Hypertension   . Hypothyroidism   . Pneumonia   . Thyroid disease     Past Surgical History:  Procedure Laterality Date  . ABDOMINAL AORTOGRAM N/A 03/25/2018   Procedure: ABDOMINAL AORTOGRAM;  Surgeon: Maeola Harmanain, Brandon Christopher, MD;  [facility];  Service: Cardiovascular;  Laterality: N/A;  . ABDOMINAL HYSTERECTOMY    . AMPUTATION Right 03/29/2018   Procedure: AMPUTATION ABOVE KNEE;  Surgeon: Nada LibmanBrabham, Vance W, MD;  [facility];  Service: Vascular;  Laterality: Right;  . APPENDECTOMY    . BACK SURGERY    . BYPASS GRAFT FEMORAL-PERONEAL Right 03/26/2018   Procedure: BYPASS GRAFT [place]ighbor[place]EMORAL-PERONEAL;  Surgeon: Maeola Harmanain, Brandon Christopher, MD;   [facility];  Service: Vascular;  Laterality: Right;  . EXPLORATORY LAPAROTOMY     Adhesions removed and appendix  . FEMORAL-POPLITEAL BYPASS GRAFT Right 03/27/2018   Procedure: Right lower extremity angiogram, Right Femoral Peroneal artery bypass, Angioplasty Right Femoral Peroneal bypass, Thrombectomy of Femoral peroneal artery;  Surgeon: Maeola Harmanain, Brandon Christopher, MD;  [facility];  Service: Vascular;  Laterality: Right;  . INTRAOPERATIVE ARTERIOGRAM Right 03/26/2018   Procedure: Intra Operative Arteriogram;  Surgeon: Maeola Harmanain, Brandon Christopher, MD;  [facility];  Service: Vascular;  Laterality: Right;  . LOWER EXTREMITY ANGIOGRAPHY Bilateral 03/25/2018   Procedure: LOWER EXTREMITY ANGIOGRAPHY;  Surgeon: Maeola Harmanain, Brandon Christopher, MD;  [facility];  Service: Cardiovascular;  Laterality: Bilateral;  . LUMBAR LAMINECTOMY/DECOMPRESSION MICRODISCECTOMY N/A 09/09/2014   Procedure: L4-L5 DECOMPRESSION ;  Surgeon: Venita Lickahari Brooks, MD;  Location: [facility];  Service: Orthopedics;  Laterality: N/A;  . PATCH ANGIOPLASTY Right 03/26/2018   Procedure: PATCH ANGIOPLASTY;  Surgeon: Maeola Harmanain, Brandon Christopher, MD;  [facility];  Service: Vascular;  Laterality: Right;  . SHOULDER SURGERY    . THYROID SURGERY    . TONSILLECTOMY    . TOTAL THYROIDECTOMY    . VEIN HARVEST Right 03/26/2018   Procedure: Vein Harvest of Right Leg Greater Saphenous Vein in SITU;  Surgeon: Maeola Harmanain, Brandon Christopher, MD;  [facility];  Service: Vascular;  Laterality: Right;  There were no vitals filed for this visit.  Subjective Assessment - 10/14/18 1230    Subjective  She reports wearing prosthesis 4 hours while "working" on Saturday but removed to toilet. She wore it Sunday & Monday morning 3-3.5 hours.    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  No/denies                       [facility] Adult PT Treatment/Exercise - 10/14/18 1230      Transfers   Transfers  Sit to Stand;Stand to Sit    Sit to Stand  5: Supervision;With upper extremity assist;From toilet;From chair/3-in-1    Stand to Sit  5: Supervision;With upper extremity assist;To chair/3-in-1;To toilet      Ambulation/Gait   Ambulation/Gait  Yes    Ambulation/Gait Assistance  5: Supervision;4: Min guard    Ambulation/Gait Assistance Details  Tactile & verbal cues on upright posture & wt shift over prosthesis.      Ambulation Distance (Feet)  35 Feet    Assistive device  Rolling walker;Prosthesis    Gait Pattern  Step-to pattern;Step-through pattern    Ambulation Surface  Level;Indoor    Ramp  3: Mod assist   RW & TFA prosthesis   Ramp Details (indicate cue type and reason)  demo, verbal & tactile/manual cues on technique with TFA prosthesis & RW    Curb  3: Mod assist   RW & TFA prosthesis   Curb Details (indicate cue type and reason)  demo, verbal & tactile/manual cues on technique with TFA prosthesis & RW      Self-Care   ADL's  reviewed verbally toileting with TFA prosthesis. Recommended practicing when no time limit. Progress to stand-pivot transfer w/c to toilet for nonurgent toileting need. Pt verbalized understanding.       Therapeutic Activites    Therapeutic Activities  --    Other Therapeutic Activities  --      Prosthetics   Prosthetic Care Comments   Pt increased wear to initial work hours until she needs to toilet.  PT encouraged her to continue this and donne for 2nd wear after work hours.  PT recommended setting a timer as she reports often falls asleep watching tv.  See ADLs for toileting.     Current prosthetic wear tolerance (days/week)   increased prosthesis to 5 of 7 days up to 4hrs but only 1x/day. Liner daily >8hrs.     Current prosthetic wear tolerance (#hours/day)   up to 4hrs removes when needs to toilet. She has  progressed to wearing while sitting not just while practicing as PT has recommended.     Residual limb condition   no issues per pt.    Education Provided  Proper wear schedule/adjustment;Proper weight-bearing schedule/adjustment;Proper Donning;Residual limb care;Correct ply sock adjustment    Person(s) Educated  Patient    Education Method  Explanation;Verbal cues    Education Method  Verbalized understanding;Needs further instruction;Verbal cues required    Donning Prosthesis  Supervision    Doffing Prosthesis  Modified independent (device/increased time)               PT Short Term Goals - 10/14/18 1651      PT SHORT TERM GOAL #1   Title  Patient transfers  stand pivot with RW between chair with armrests & chair without armrest using UEs with supervision. (ALL STGs updated Target Date:11/08/2018)    Time  4    Period  Weeks    Status  On-going    Target Date  11/08/18      PT SHORT TERM GOAL #2   Title  Patient tolerates wearing prosthesis >/= 5 day/wk  >3hrs/day & liner >8hrs total /day without skin issues.    Time  4    Period  Weeks    Status  On-going    Target Date  11/08/18      PT SHORT TERM GOAL #3   Title  Patient demonstrates ability to toilet with prosthesis including managing pants & sit/stand with supervision.    Time  4    Period  Weeks    Status  On-going    Target Date  11/08/18      PT SHORT TERM GOAL #4   Title  Patient ambulates 100' with RW & prosthesis with supervision.    Time  4    Period  Weeks    Status  On-going    Target Date  11/08/18      PT SHORT TERM GOAL #5   Title  Standing balance with RW support reaches 5" anteriorly & pick up item from floor with supervision.    Time  4    Period  Weeks    Status  On-going    Target Date  11/08/18      PT SHORT TERM GOAL #6   Title  Patient negotiates ramps & curbs with RW & prosthesis with minimal assist.    Time  4    Period  Weeks    Status  On-going    Target Date  11/08/18         PT Long Term Goals - 10/08/18 0841      PT LONG TERM GOAL #1   Title  Patient demonstrates & verbalizes proper prosthetic care to enable safe use of prosthesis.  (All LTGs Target Date: 01/03/2019)    Time  12    Period  Weeks    Status  On-going    Target Date  01/03/19      PT LONG TERM GOAL #2   Title  Patient tolerates prosthesis wear >80% of awake hours to improve ability to function during her day.    Time  12    Period  Weeks    Status  On-going    Target Date  01/03/19      PT LONG TERM GOAL #3   Title  Patient standing balance with RW support & prosthesis: static no UE support 1 minute, dynamic RW support scans, reaches 7" anteriorly & manages clothes modified independent.    Time  12    Period  Weeks    Status  On-going    Target Date  01/03/19      PT LONG TERM GOAL #4   Title  Patient ambulates 200' with RW & prosthesis modifiied independent.    Time  12    Period  Weeks    Status  On-going    Target Date  01/03/19      PT LONG TERM GOAL #5   Title  Patient negotiates ramps & curbs with RW & prosthesis modified independent.    Time  12    Period  Weeks    Status  On-going    Target Date  01/03/19  Plan - 10/15/18 1610    Clinical Impression Statement  Patient appears to have transitioned to wearing prosthesis when sitting during work hours until she needs to toilet. PT reviewed toileting with recommendation to work on it at home as pt did not want to go into bathroom at PT to practice.  PT instructed in ramps for first time with TFA prosthesis. She needs additional work to improve before attempting outside of PT>    Personal Factors and Comorbidities  Age;Comorbidity 3+;Fitness;Past/Current Experience;Time since onset of injury/illness/exacerbation;Other;Transportation   lives alone so limited ability to practice tasks that require assist or supervision   Comorbidities  R TFA, L4-5 decompression sg 2016, DM, HTN,    Examination-Activity  Limitations  Bend;Caring for Others;Carry;Locomotion Level;Reach Overhead;Stairs;Stand;Toileting;Transfers    Examination-Participation Restrictions  Community Activity;Church;Meal Prep    Stability/Clinical Decision Making  Evolving/Moderate complexity    Rehab Potential  Good    PT Frequency  2x / week    PT Duration  12 weeks    PT Treatment/Interventions  ADLs/Self Care Home Management;DME Instruction;Gait training;Stair training;Functional mobility training;Therapeutic activities;Therapeutic exercise;Balance training;Neuromuscular re-education;Patient/family education;Prosthetic Training;Vestibular;Passive range of motion;Manual techniques    PT Next Visit Plan  work towards updated STGs, check on toileting & wear    Consulted and Agree with Plan of Care  Patient       Patient will benefit from skilled therapeutic intervention in order to improve the following deficits and impairments:  Abnormal gait, Decreased activity tolerance, Decreased balance, Decreased endurance, Decreased knowledge of use of DME, Decreased mobility, Decreased range of motion, Decreased strength, Dizziness, Increased edema, Impaired flexibility, Postural dysfunction, Prosthetic Dependency, Pain, Obesity  Visit Diagnosis: Unsteadiness on feet  Other abnormalities of gait and mobility  Abnormal posture  Muscle weakness (generalized)  Stiffness of right hip, not elsewhere classified     Problem List Patient Active Problem List   Diagnosis Date Noted  . PAD (peripheral artery disease) (HCC) 03/23/2018  . Back pain 09/09/2014  . HYPERLIPIDEMIA 12/18/2006  . RECTAL POLYPS 12/18/2006  . 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 SBO 08/01/2006  . GOITER NOS 03/11/2006  . PERIPHERAL NEUROPATHY 03/11/2006  . HYPERTENSION 03/11/2006  . OSTEOPENIA 03/11/2006  . FOOT DROP 03/11/2006    Spruha Weight PT, DPT 10/15/2018, 6:55 AM  [facility] [address] [place], [place], [address] Phone: [phone]   Fax:  [phone]  Name: Judeen Geralds MRN: [id number] Date of Birth: 1945-06-17

## 2018-10-16 ENCOUNTER — Encounter: Payer: Medicare HMO | Admitting: Physical Therapy

## 2018-10-21 ENCOUNTER — Encounter: Payer: Self-pay | Admitting: Physical Therapy

## 2018-10-21 ENCOUNTER — Other Ambulatory Visit: Payer: Self-pay

## 2018-10-21 ENCOUNTER — Ambulatory Visit: Payer: Medicare HMO | Attending: Internal Medicine | Admitting: Physical Therapy

## 2018-10-21 DIAGNOSIS — M25651 Stiffness of right hip, not elsewhere classified: Secondary | ICD-10-CM | POA: Diagnosis not present

## 2018-10-21 DIAGNOSIS — M6281 Muscle weakness (generalized): Secondary | ICD-10-CM | POA: Diagnosis not present

## 2018-10-21 DIAGNOSIS — R293 Abnormal posture: Secondary | ICD-10-CM | POA: Insufficient documentation

## 2018-10-21 DIAGNOSIS — R2681 Unsteadiness on feet: Secondary | ICD-10-CM

## 2018-10-21 DIAGNOSIS — R2689 Other abnormalities of gait and mobility: Secondary | ICD-10-CM | POA: Insufficient documentation

## 2018-10-21 NOTE — Therapy (Signed)
[facility] [address] [place], [place], [address] Phone: [phone]   Fax:  [phone]  Physical Therapy Treatment  Patient Details  Name: Emma PeopleDeitra Chicas MRN: [id number] Date of Birth: 19-Jun-1945 Referring Provider (PT): Jackie PlumGeorge Osei-Bonsu, MD   Encounter Date: 10/21/2018  PT End of Session - 10/21/18 1213    Visit Number  16    Number of Visits  25    Date for PT Re-Evaluation  10/15/18    Authorization Type  HUMANA  Medicare    PT Start Time  1109    PT Stop Time  1153    PT Time Calculation (min)  44 min    Equipment Utilized During Treatment  Gait belt    Activity Tolerance  Patient tolerated treatment well    Behavior During Therapy  WFL for tasks assessed/performed       Past Medical History:  Diagnosis Date  . Arthritis    Back and right shoulder  . Constipation   . Diabetes mellitus    Type II  . Diabetes mellitus without complication (HCC)   . Diabetic neuropathy (HCC)   . History of blood transfusion   . Hypercholesteremia   . Hypertension   . Hypothyroidism   . Pneumonia   . Thyroid disease     Past Surgical History:  Procedure Laterality Date  . ABDOMINAL AORTOGRAM N/A 03/25/2018   Procedure: ABDOMINAL AORTOGRAM;  Surgeon: Maeola Harmanain, Brandon Christopher, MD;  [facility];  Service: Cardiovascular;  Laterality: N/A;  . ABDOMINAL HYSTERECTOMY    . AMPUTATION Right 03/29/2018   Procedure: AMPUTATION ABOVE KNEE;  Surgeon: Nada LibmanBrabham, Vance W, MD;  [facility];  Service: Vascular;  Laterality: Right;  . APPENDECTOMY    . BACK SURGERY    . BYPASS GRAFT FEMORAL-PERONEAL Right 03/26/2018   Procedure: BYPASS GRAFT [place] NeighborFEMORAL-PERONEAL;  Surgeon: Maeola Harmanain, Brandon Christopher, MD;  [facility];  Service: Vascular;  Laterality: Right;  . EXPLORATORY LAPAROTOMY     Adhesions removed and appendix  . FEMORAL-POPLITEAL BYPASS GRAFT Right 03/27/2018   Procedure: Right lower extremity  angiogram, Right Femoral Peroneal artery bypass, Angioplasty Right Femoral Peroneal bypass, Thrombectomy of Femoral peroneal artery;  Surgeon: Maeola Harmanain, Brandon Christopher, MD;  [facility];  Service: Vascular;  Laterality: Right;  . INTRAOPERATIVE ARTERIOGRAM Right 03/26/2018   Procedure: Intra Operative Arteriogram;  Surgeon: Maeola Harmanain, Brandon Christopher, MD;  [facility];  Service: Vascular;  Laterality: Right;  . LOWER EXTREMITY ANGIOGRAPHY Bilateral 03/25/2018   Procedure: LOWER EXTREMITY ANGIOGRAPHY;  Surgeon: Maeola Harmanain, Brandon Christopher, MD;  [facility];  Service: Cardiovascular;  Laterality: Bilateral;  . LUMBAR LAMINECTOMY/DECOMPRESSION MICRODISCECTOMY N/A 09/09/2014   Procedure: L4-L5 DECOMPRESSION ;  Surgeon: Venita Lickahari Brooks, MD;  Location: [facility] OR;  Service: Orthopedics;  Laterality: N/A;  . PATCH ANGIOPLASTY Right 03/26/2018   Procedure: PATCH ANGIOPLASTY;  Surgeon: Maeola Harmanain, Brandon Christopher, MD;  [facility];  Service: Vascular;  Laterality: Right;  . SHOULDER SURGERY    . THYROID SURGERY    . TONSILLECTOMY    . TOTAL THYROIDECTOMY    . VEIN HARVEST Right 03/26/2018   Procedure: Vein Harvest of Right Leg Greater Saphenous Vein in SITU;  Surgeon: Maeola Harmanain, Brandon Christopher, MD;  [facility];  Service: Vascular;  Laterality: Right;    There were no vitals filed for this visit.  Subjective Assessment - 10/21/18 1115    Subjective  She has worn prosthesis 4 of last 7 days. But limited amount  of time due to increased frequency with urination. In response to PT question, she reports that she does not check her blood glucose as she can "feel" when it's off. 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  No/denies                       [facility]  PT Treatment/Exercise - 10/21/18 1110      Transfers   Transfers  Sit to Stand;Stand to Sit    Sit to Stand  5: Supervision;With upper extremity assist;From chair/3-in-1;3: Mod assist;With armrests   ModA from chair w/o armrests, sup w/armrests   Sit to Stand Details  Visual cues for safe use of DME/AE;Verbal cues for safe use of DME/AE;Verbal cues for technique    Stand to Sit  5: Supervision;With upper extremity assist;4: Min assist;With armrests;To chair/3-in-1   MinA chairs w/o armrests, sup chairs w/ armrests   Stand to Sit Details (indicate cue type and reason)  Visual cues for safe use of DME/AE;Verbal cues for technique;Verbal cues for safe use of DME/AE      Ambulation/Gait   Ambulation/Gait  Yes    Ambulation/Gait Assistance  5: Supervision    Ambulation/Gait Assistance Details  verbal & tactile cues on posture, how far to advance walker related to feet, timing initial contact with prosthetic heel & wt shift.     Ambulation Distance (Feet)  60 Feet   60' + ascend ramp/descend curb; ascend curb/descend ramp 60'   Assistive device  Rolling walker;Prosthesis    Gait Pattern  Step-to pattern;Step-through pattern    Ambulation Surface  Indoor;Level    Ramp  3: Mod assist   RW & TFA prosthesis   Ramp Details (indicate cue type and reason)  verabal & tactile cues on sequence, technique, wt shift & posture.     Curb  3: Mod assist   RW & TFA prosthesis   Curb Details (indicate cue type and reason)  verabal & tactile cues on sequence, technique, wt shift & posture.     Gait Comments  worked on increasing endurance with amb 60' neg ramp/curb,  rest,  neg ramp/curb & 50'       Prosthetics   Prosthetic Care Comments   Pt increased wear to initial work hours until she needs to toilet.  PT encouraged her to continue this and donne for 2nd wear after work hours.  PT recommended setting a timer as she reports often falls asleep watching tv.  See ADLs for toileting.     Current prosthetic wear  tolerance (days/week)   increased prosthesis to 5 of 7 days up to 4hrs but only 1x/day. Liner daily >8hrs.     Current prosthetic wear tolerance (#hours/day)   up to 4hrs removes when needs to toilet. She has progressed to wearing while sitting not just while practicing as PT has recommended.     Residual limb condition   no issues per pt.    Education Provided  Proper wear schedule/adjustment;Proper weight-bearing schedule/adjustment;Proper Donning;Residual limb care;Correct ply sock adjustment    Person(s) Educated  Patient    Education Method  Explanation;Verbal cues    Education Method  Verbalized understanding;Verbal cues required;Needs further instruction    Donning Prosthesis  Supervision    Doffing Prosthesis  Modified independent (device/increased time)             PT Education - 10/21/18 1206    Education Details  Hip flexor stretch with glut set Medbridge  Access Code: [email or web address]  Smoking Cessation / risk of other LE amputation, CVA, MI, kidney failure    Person(s) Educated  Patient    Methods  Explanation;Demonstration;Tactile cues;Verbal cues;Handout    Comprehension  Verbalized understanding;Returned demonstration;Verbal cues required;Tactile cues required;Need further instruction       PT Short Term Goals - 10/14/18 1651      PT SHORT TERM GOAL #1   Title  Patient transfers stand pivot with RW between chair with armrests & chair without armrest using UEs with supervision. (ALL STGs updated Target Date:11/08/2018)    Time  4    Period  Weeks    Status  On-going    Target Date  11/08/18      PT SHORT TERM GOAL #2   Title  Patient tolerates wearing prosthesis >/= 5 day/wk  >3hrs/day & liner >8hrs total /day without skin issues.    Time  4    Period  Weeks    Status  On-going    Target Date  11/08/18      PT SHORT TERM GOAL #3   Title  Patient demonstrates ability to toilet with prosthesis including managing pants & sit/stand with supervision.    Time  4     Period  Weeks    Status  On-going    Target Date  11/08/18      PT SHORT TERM GOAL #4   Title  Patient ambulates 100' with RW & prosthesis with supervision.    Time  4    Period  Weeks    Status  On-going    Target Date  11/08/18      PT SHORT TERM GOAL #5   Title  Standing balance with RW support reaches 5" anteriorly & pick up item from floor with supervision.    Time  4    Period  Weeks    Status  On-going    Target Date  11/08/18      PT SHORT TERM GOAL #6   Title  Patient negotiates ramps & curbs with RW & prosthesis with minimal assist.    Time  4    Period  Weeks    Status  On-going    Target Date  11/08/18        PT Long Term Goals - 10/08/18 0841      PT LONG TERM GOAL #1   Title  Patient demonstrates & verbalizes proper prosthetic care to enable safe use of prosthesis.  (All LTGs Target Date: 01/03/2019)    Time  12    Period  Weeks    Status  On-going    Target Date  01/03/19      PT LONG TERM GOAL #2   Title  Patient tolerates prosthesis wear >80% of awake hours to improve ability to function during her day.    Time  12    Period  Weeks    Status  On-going    Target Date  01/03/19      PT LONG TERM GOAL #3   Title  Patient standing balance with RW support & prosthesis: static no UE support 1 minute, dynamic RW support scans, reaches 7" anteriorly & manages clothes modified independent.    Time  12  Period  Weeks    Status  On-going    Target Date  01/03/19      PT LONG TERM GOAL #4   Title  Patient ambulates 200' with RW & prosthesis modifiied independent.    Time  12    Period  Weeks    Status  On-going    Target Date  01/03/19      PT LONG TERM GOAL #5   Title  Patient negotiates ramps & curbs with RW & prosthesis modified independent.    Time  12    Period  Weeks    Status  On-going    Target Date  01/03/19            Plan - 10/21/18 2235    Clinical Impression Statement  Patient continues to struggle with prosthesis wear  outside of PT limiting function.  PT instructed pt in supine hip flexor stretch.  PT combined level gait with negotiating ramp/curb to work on endurance before requiring a rest.  PT also worked on arising from chairs without armrests using UEs.    Personal Factors and Comorbidities  Age;Comorbidity 3+;Fitness;Past/Current Experience;Time since onset of injury/illness/exacerbation;Other;Transportation   lives alone so limited ability to practice tasks that require assist or supervision   Comorbidities  R TFA, L4-5 decompression sg 2016, DM, HTN,    Examination-Activity Limitations  Bend;Caring for Others;Carry;Locomotion Level;Reach Overhead;Stairs;Stand;Toileting;Transfers    Examination-Participation Restrictions  Community Activity;Church;Meal Prep    Stability/Clinical Decision Making  Evolving/Moderate complexity    Rehab Potential  Good    PT Frequency  2x / week    PT Duration  12 weeks    PT Treatment/Interventions  ADLs/Self Care Home Management;DME Instruction;Gait training;Stair training;Functional mobility training;Therapeutic activities;Therapeutic exercise;Balance training;Neuromuscular re-education;Patient/family education;Prosthetic Training;Vestibular;Passive range of motion;Manual techniques    PT Next Visit Plan  work towards updated STGs, check on toileting & wear    PT Home Exercise Plan  Medbridge Access Code: [email or web address]    Consulted and Agree with Plan of Care  Patient       Patient will benefit from skilled therapeutic intervention in order to improve the following deficits and impairments:  Abnormal gait, Decreased activity tolerance, Decreased balance, Decreased endurance, Decreased knowledge of use of DME, Decreased mobility, Decreased range of motion, Decreased strength, Dizziness, Increased edema, Impaired flexibility, Postural dysfunction, Prosthetic Dependency, Pain, Obesity  Visit Diagnosis: Unsteadiness on feet  Other abnormalities of gait and mobility  Abnormal  posture  Muscle weakness (generalized)  Stiffness of right hip, not elsewhere classified     Problem List Patient Active Problem List   Diagnosis Date Noted  . PAD (peripheral artery disease) ([place]) 03/23/2018  . Back pain 09/09/2014  . HYPERLIPIDEMIA 12/18/2006  . RECTAL POLYPS 12/18/2006  . 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 SBO 08/01/2006  . GOITER NOS 03/11/2006  . PERIPHERAL NEUROPATHY 03/11/2006  . HYPERTENSION 03/11/2006  . OSTEOPENIA 03/11/2006  . FOOT DROP 03/11/2006    Jamey Reas PT, DPT 10/21/2018, 10:39 PM  [facility] [address] [place], [place], [address] Phone: [phone]   Fax:  [phone]  Name: Nautika Cressey MRN: [id number] Date of Birth: June 26, 1945

## 2018-10-21 NOTE — Patient Instructions (Signed)
Access Code: [email or web address]  URL: [email or web address]  Date: 10/21/2018  Prepared by: Jamey Reas   Exercises Modified Marcello Moores Stretch - 3 reps - 1 sets - 1 minute hold - 1-2x daily - 7x weekly Supine Gluteal Sets - 3 reps - 3 sets - 5 seconds hold - 1-2x daily - 7x weekly

## 2018-10-23 ENCOUNTER — Other Ambulatory Visit: Payer: Self-pay

## 2018-10-23 ENCOUNTER — Ambulatory Visit: Payer: Medicare HMO | Admitting: Physical Therapy

## 2018-10-23 ENCOUNTER — Encounter: Payer: Self-pay | Admitting: Physical Therapy

## 2018-10-23 DIAGNOSIS — R2689 Other abnormalities of gait and mobility: Secondary | ICD-10-CM | POA: Diagnosis not present

## 2018-10-23 DIAGNOSIS — R2681 Unsteadiness on feet: Secondary | ICD-10-CM

## 2018-10-23 DIAGNOSIS — E119 Type 2 diabetes mellitus without complications: Secondary | ICD-10-CM | POA: Diagnosis not present

## 2018-10-23 DIAGNOSIS — R293 Abnormal posture: Secondary | ICD-10-CM | POA: Diagnosis not present

## 2018-10-23 DIAGNOSIS — M6281 Muscle weakness (generalized): Secondary | ICD-10-CM

## 2018-10-23 DIAGNOSIS — M25651 Stiffness of right hip, not elsewhere classified: Secondary | ICD-10-CM | POA: Diagnosis not present

## 2018-10-23 DIAGNOSIS — Z89611 Acquired absence of right leg above knee: Secondary | ICD-10-CM | POA: Diagnosis not present

## 2018-10-23 DIAGNOSIS — I739 Peripheral vascular disease, unspecified: Secondary | ICD-10-CM | POA: Diagnosis not present

## 2018-10-24 NOTE — Therapy (Signed)
[facility] [address] [place], [place], [address] Phone: [phone]   Fax:  [phone]  Physical Therapy Treatment  Patient Details  Name: Emma [name] MRN: [id number] Date of Birth: [birth year] Referring Provider (PT): Jackie Plum, MD   Encounter Date: 10/23/2018   CLINIC OPERATION CHANGES: Outpatient Neuro Rehab is open at lower capacity following universal masking, social distancing, and patient screening.  The patient's COVID risk of complications score is 4.   PT End of Session - 10/23/18 1845    Visit Number  17    Number of Visits  25    Date for PT Re-Evaluation  10/15/18    Authorization Type  HUMANA  Medicare    PT Start Time  1745    PT Stop Time  1832    PT Time Calculation (min)  47 min    Equipment Utilized During Treatment  Gait belt    Activity Tolerance  Patient tolerated treatment well    Behavior During Therapy  WFL for tasks assessed/performed       Past Medical History:  Diagnosis Date  . Arthritis    Back and right shoulder  . Constipation   . Diabetes mellitus    Type II  . Diabetes mellitus without complication (HCC)   . Diabetic neuropathy (HCC)   . History of blood transfusion   . Hypercholesteremia   . Hypertension   . Hypothyroidism   . Pneumonia   . Thyroid disease     Past Surgical History:  Procedure Laterality Date  . ABDOMINAL AORTOGRAM N/A 03/25/2018   Procedure: ABDOMINAL AORTOGRAM;  Surgeon: Maeola Harman, MD;  [facility] INV[facility] CV LAB;  Service: Cardiovascular;  Laterality: N/A;  . ABDOMINAL HYSTERECTOMY    . AMPUTATION Right 03/29/2018   Procedure: AMPUTATION ABOVE KNEE;  Surgeon: Nada Libman, MD;  [facility] OR;  Service: Vascular;  Laterality: Right;  . APPENDECTOMY    . BACK SURGERY    . BYPASS GRAFT FEMORAL-PERONEAL Right 03/26/2018   Procedure: BYPASS GRAFT [place]ighbor;  Surgeon: Maeola Harman, MD;   Location[facility] OR;  Service: Vascular;  Laterality: Right;  . EXPLORATORY LAPAROTOMY     Adhesions removed and appendix  . FEMORAL-POPLITEAL BYPASS GRAFT Right 03/27/2018   Procedure: Right lower extremity angiogram, Right Femoral Peroneal artery bypass, Angioplasty Right Femoral Peroneal bypass, Thrombectomy of Femoral peroneal artery;  Surgeon: Maeola Harman, MD;  Location: [facility] OR;  Service: Vascular;  Laterality: Right;  . INTRAOPERATIVE ARTERIOGRAM Right 03/26/2018   Procedure: Intra Operative Arteriogram;  Surgeon: Maeola Harman, MD;  Location: [facility];  Service: Vascular;  Laterality: Right;  . LOWER EXTREMITY ANGIOGRAPHY Bilateral 03/25/2018   Procedure: LOWER EXTREMITY ANGIOGRAPHY;  Surgeon: Maeola Harman, MD;  Location: [facility] INVASIVE CV LAB;  Service: Cardiovascular;  Laterality: Bilateral;  . LUMBAR LAMINECTOMY/DECOMPRESSION MICRODISCECTOMY N/A 09/09/2014   Procedure: L4-L5 DECOMPRESSION ;  Surgeon: Venita Lick, MD;  Location: [facility] OR;  Service: Orthopedics;  Laterality: N/A;  . PATCH ANGIOPLASTY Right 03/26/2018   Procedure: PATCH ANGIOPLASTY;  Surgeon: Maeola Harman, MD;  Location: [facility] OR;  Service: Vascular;  Laterality: Right;  . SHOULDER SURGERY    . THYROID SURGERY    . TONSILLECTOMY    . TOTAL THYROIDECTOMY    . VEIN HARVEST Right 03/26/2018   Procedure: Vein Harvest of Right Leg Greater Saphenous Vein in SITU;  Surgeon: Maeola Harman, MD;  Location: [facility] OR;  Service: Vascular;  Laterality: Right;  There were no vitals filed for this visit.  Subjective Assessment - 10/23/18 1747    Subjective  She wore prosthesis only ~2hrs due to increased frequency.  She has not made appt with MD.    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  No/denies                        [facility] Adult PT Treatment/Exercise - 10/24/18 0001      Self-Care   Self-Care  Lifting    Lifting  PT demo & verbal cues on how to pick up object from floor. Pt return demo picking up 5" bottle with RW support with minA & verbal cues.       Prosthetics   Prosthetic Care Comments   PT reviewed again need to increase wear time to become more functional.      Current prosthetic wear tolerance (days/week)   ~4 days/wk    Current prosthetic wear tolerance (#hours/day)   up to 4hrs (but 2hrs lately due to increased frequency of urination) removes when needs to toilet. She has progressed to wearing while sitting not just while practicing as PT has recommended.     Residual limb condition   no issues per pt.    Education Provided  Skin check;Residual limb care;Proper Donning;Proper wear schedule/adjustment;Correct ply sock adjustment    Person(s) Educated  Patient    Education Method  Explanation;Demonstration;Tactile cues;Verbal cues    Education Method  Verbalized understanding;Returned demonstration;Tactile cues required;Verbal cues required;Needs further instruction    Donning Prosthesis  Supervision    Doffing Prosthesis  Modified independent (device/increased time)               PT Short Term Goals - 10/14/18 1651      PT SHORT TERM GOAL #1   Title  Patient transfers stand pivot with RW between chair with armrests & chair without armrest using UEs with supervision. (ALL STGs updated Target Date:11/08/2018)    Time  4    Period  Weeks    Status  On-going    Target Date  11/08/18      PT SHORT TERM GOAL #2   Title  Patient tolerates wearing prosthesis >/= 5 day/wk  >3hrs/day & liner >8hrs total /day without skin issues.    Time  4    Period  Weeks    Status  On-going    Target Date  11/08/18      PT SHORT TERM GOAL #3   Title  Patient demonstrates ability to toilet with prosthesis including managing pants & sit/stand with supervision.     Time  4    Period  Weeks    Status  On-going    Target Date  11/08/18      PT SHORT TERM GOAL #4   Title  Patient ambulates 100' with RW & prosthesis with supervision.    Time  4    Period  Weeks    Status  On-going    Target Date  11/08/18      PT SHORT TERM GOAL #5   Title  Standing balance with RW support reaches 5" anteriorly & pick up item from floor with supervision.    Time  4    Period  Weeks  Status  On-going    Target Date  11/08/18      PT SHORT TERM GOAL #6   Title  Patient negotiates ramps & curbs with RW & prosthesis with minimal assist.    Time  4    Period  Weeks    Status  On-going    Target Date  11/08/18        PT Long Term Goals - 10/08/18 0841      PT LONG TERM GOAL #1   Title  Patient demonstrates & verbalizes proper prosthetic care to enable safe use of prosthesis.  (All LTGs Target Date: 01/03/2019)    Time  12    Period  Weeks    Status  On-going    Target Date  01/03/19      PT LONG TERM GOAL #2   Title  Patient tolerates prosthesis wear >80% of awake hours to improve ability to function during her day.    Time  12    Period  Weeks    Status  On-going    Target Date  01/03/19      PT LONG TERM GOAL #3   Title  Patient standing balance with RW support & prosthesis: static no UE support 1 minute, dynamic RW support scans, reaches 7" anteriorly & manages clothes modified independent.    Time  12    Period  Weeks    Status  On-going    Target Date  01/03/19      PT LONG TERM GOAL #4   Title  Patient ambulates 200' with RW & prosthesis modifiied independent.    Time  12    Period  Weeks    Status  On-going    Target Date  01/03/19      PT LONG TERM GOAL #5   Title  Patient negotiates ramps & curbs with RW & prosthesis modified independent.    Time  12    Period  Weeks    Status  On-going    Target Date  01/03/19            Plan - 10/23/18 1911    Clinical Impression Statement  PT progressed prosthetic gait to include  negotiating around furniture. Patient required less assistance on ramps & curbs today. PT instructed in picking up objects from floor.    Personal Factors and Comorbidities  Age;Comorbidity 3+;Fitness;Past/Current Experience;Time since onset of injury/illness/exacerbation;Other;Transportation   lives alone so limited ability to practice tasks that require assist or supervision   Comorbidities  R TFA, L4-5 decompression sg 2016, DM, HTN,    Examination-Activity Limitations  Bend;Caring for Others;Carry;Locomotion Level;Reach Overhead;Stairs;Stand;Toileting;Transfers    Examination-Participation Restrictions  Community Activity;Church;Meal Prep    Stability/Clinical Decision Making  Evolving/Moderate complexity    Rehab Potential  Good    PT Frequency  2x / week    PT Duration  12 weeks    PT Treatment/Interventions  ADLs/Self Care Home Management;DME Instruction;Gait training;Stair training;Functional mobility training;Therapeutic activities;Therapeutic exercise;Balance training;Neuromuscular re-education;Patient/family education;Prosthetic Training;Vestibular;Passive range of motion;Manual techniques    PT Next Visit Plan  work towards updated STGs, check on toileting & wear    PT Home Exercise Plan  Medbridge Access Code: [email or web address]    Consulted and Agree with Plan of Care  Patient       Patient will benefit from skilled therapeutic intervention in order to improve the following deficits and impairments:  Abnormal gait, Decreased activity tolerance, Decreased balance, Decreased endurance, Decreased knowledge of use of DME, Decreased  mobility, Decreased range of motion, Decreased strength, Dizziness, Increased edema, Impaired flexibility, Postural dysfunction, Prosthetic Dependency, Pain, Obesity  Visit Diagnosis: Unsteadiness on feet  Abnormal posture  Other abnormalities of gait and mobility  Muscle weakness (generalized)  Stiffness of right hip, not elsewhere classified     Problem  List Patient Active Problem List   Diagnosis Date Noted  . PAD (peripheral artery disease) ([facility]) 03/23/2018  . Back pain 09/09/2014  . HYPERLIPIDEMIA 12/18/2006  . RECTAL POLYPS 12/18/2006  . 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 SBO 08/01/2006  . GOITER NOS 03/11/2006  . PERIPHERAL NEUROPATHY 03/11/2006  . HYPERTENSION 03/11/2006  . OSTEOPENIA 03/11/2006  . FOOT DROP 03/11/2006    Vladimir Faster PT, DPT 10/24/2018, 7:13 PM  [facility] [address] [place], [place], [address] Phone: [phone]   Fax:  [phone]  Name: Emma [name] MRN: [id number] Date of Birth: 07-01-1945

## 2018-10-28 ENCOUNTER — Ambulatory Visit: Payer: Medicare HMO | Admitting: Physical Therapy

## 2018-10-30 ENCOUNTER — Other Ambulatory Visit: Payer: Self-pay

## 2018-10-30 ENCOUNTER — Ambulatory Visit: Payer: Medicare HMO | Admitting: Physical Therapy

## 2018-10-30 DIAGNOSIS — Z89611 Acquired absence of right leg above knee: Secondary | ICD-10-CM

## 2018-10-30 DIAGNOSIS — I739 Peripheral vascular disease, unspecified: Secondary | ICD-10-CM

## 2018-11-04 ENCOUNTER — Encounter: Payer: Self-pay | Admitting: Physical Therapy

## 2018-11-04 ENCOUNTER — Ambulatory Visit: Payer: Medicare HMO | Admitting: Physical Therapy

## 2018-11-04 ENCOUNTER — Ambulatory Visit (HOSPITAL_COMMUNITY): Admission: RE | Admit: 2018-11-04 | Payer: Self-pay | Source: Ambulatory Visit

## 2018-11-04 ENCOUNTER — Other Ambulatory Visit: Payer: Self-pay

## 2018-11-04 DIAGNOSIS — M6281 Muscle weakness (generalized): Secondary | ICD-10-CM | POA: Diagnosis not present

## 2018-11-04 DIAGNOSIS — R293 Abnormal posture: Secondary | ICD-10-CM

## 2018-11-04 DIAGNOSIS — R2689 Other abnormalities of gait and mobility: Secondary | ICD-10-CM | POA: Diagnosis not present

## 2018-11-04 DIAGNOSIS — R2681 Unsteadiness on feet: Secondary | ICD-10-CM | POA: Diagnosis not present

## 2018-11-04 DIAGNOSIS — M25651 Stiffness of right hip, not elsewhere classified: Secondary | ICD-10-CM

## 2018-11-04 NOTE — Therapy (Signed)
[facility] [address] [place], [place], [address] Phone: [phone]   Fax:  [phone]  Physical Therapy Treatment  Patient Details  Name: Emma [name] MRN: [id number] Date of Birth: [birth year] Referring Provider (PT): Benito Mccreedy, MD   Encounter Date: 11/04/2018  PT End of Session - 11/04/18 1601    Visit Number  18    Number of Visits  25    Date for PT Re-Evaluation  10/15/18    Authorization Type  [employer]  Medicare    PT Start Time  1325    PT Stop Time  1408    PT Time Calculation (min)  43 min    Equipment Utilized During Treatment  Gait belt    Activity Tolerance  Patient tolerated treatment well    Behavior During Therapy  WFL for tasks assessed/performed       Past Medical History:  Diagnosis Date  . Arthritis    Back and right shoulder  . Constipation   . Diabetes mellitus    Type II  . Diabetes mellitus without complication ([facility])   . Diabetic neuropathy ([place])   . History of blood transfusion   . Hypercholesteremia   . Hypertension   . Hypothyroidism   . Pneumonia   . Thyroid disease     Past Surgical History:  Procedure Laterality Date  . ABDOMINAL AORTOGRAM N/A 03/25/2018   Procedure: ABDOMINAL AORTOGRAM;  Surgeon: Waynetta Sandy, MD;  Location: [facility];  Service: Cardiovascular;  Laterality: N/A;  . ABDOMINAL HYSTERECTOMY    . AMPUTATION Right 03/29/2018   Procedure: AMPUTATION ABOVE KNEE;  Surgeon: Serafina Mitchell, MD;  Location: [facility];  Service: Vascular;  Laterality: Right;  . APPENDECTOMY    . BACK SURGERY    . BYPASS GRAFT FEMORAL-PERONEAL Right 03/26/2018   Procedure: BYPASS GRAFT [place];  Surgeon: Waynetta Sandy, MD;  Location: [facility];  Service: Vascular;  Laterality: Right;  . EXPLORATORY LAPAROTOMY     Adhesions removed and appendix  . FEMORAL-POPLITEAL BYPASS GRAFT Right 03/27/2018   Procedure: Right lower extremity  angiogram, Right Femoral Peroneal artery bypass, Angioplasty Right Femoral Peroneal bypass, Thrombectomy of Femoral peroneal artery;  Surgeon: Waynetta Sandy, MD;  Location: [facility];  Service: Vascular;  Laterality: Right;  . INTRAOPERATIVE ARTERIOGRAM Right 03/26/2018   Procedure: Intra Operative Arteriogram;  Surgeon: Waynetta Sandy, MD;  Location: [facility];  Service: Vascular;  Laterality: Right;  . LOWER EXTREMITY ANGIOGRAPHY Bilateral 03/25/2018   Procedure: LOWER EXTREMITY ANGIOGRAPHY;  Surgeon: Waynetta Sandy, MD;  Location: [facility];  Service: Cardiovascular;  Laterality: Bilateral;  . LUMBAR LAMINECTOMY/DECOMPRESSION MICRODISCECTOMY N/A 09/09/2014   Procedure: L4-L5 DECOMPRESSION ;  Surgeon: Melina Schools, MD;  Location: [facility];  Service: Orthopedics;  Laterality: N/A;  . PATCH ANGIOPLASTY Right 03/26/2018   Procedure: PATCH ANGIOPLASTY;  Surgeon: Waynetta Sandy, MD;  Location: [facility];  Service: Vascular;  Laterality: Right;  . SHOULDER SURGERY    . THYROID SURGERY    . TONSILLECTOMY    . TOTAL THYROIDECTOMY    . VEIN HARVEST Right 03/26/2018   Procedure: Vein Harvest of Right Leg Greater Saphenous Vein in SITU;  Surgeon: Waynetta Sandy, MD;  Location: [facility];  Service: Vascular;  Laterality: Right;    There were no vitals filed for this visit.  Subjective Assessment - 11/04/18 1225    Subjective  She wore prosthesis 7 of 12 days since last PT appt. No  falls.    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  No/denies                       [facility] Adult PT Treatment/Exercise - 11/04/18 1330      Transfers   Transfers  Sit to Stand;Stand to Sit    Sit to Stand  5: Supervision;With upper extremity assist;From chair/3-in-1;With armrests;4: Min assist    MinA from chair w/o armrests, sup w/armrests   Sit to Stand Details  Visual cues for safe use of DME/AE;Verbal cues for safe use of DME/AE;Verbal cues for technique    Stand to Sit  5: Supervision;With upper extremity assist;With armrests;To chair/3-in-1;4: Min guard   Min guard chairs w/o armrests, sup chairs w/ armrests   Stand to Sit Details (indicate cue type and reason)  Visual cues for safe use of DME/AE;Verbal cues for technique;Verbal cues for safe use of DME/AE      Ambulation/Gait   Ambulation/Gait  Yes    Ambulation/Gait Assistance  5: Supervision    Ambulation/Gait Assistance Details  verbal & tactile cues on upright posture, wt shift over prosthesis & prosthetic knee control at initial contact    Ambulation Distance (Feet)  70 Feet   75'   Assistive device  Rolling walker;Prosthesis    Gait Pattern  Step-to pattern;Step-through pattern    Ambulation Surface  Level;Indoor      Self-Care   ADL's  standing at sink with abdomen touching sink for BUE activity simulating washing dishes  or single UE support of reaching:  verbal & tactile cues      Prosthetics   Prosthetic Care Comments   PT recommended to increase wear to daily.  Patient continues to report toileting is primary limiting factor.  PT requested patient to take pictures of both bathrooms.  May use guest bathroom if wearing prosthesis & master bath if no prosthesis    Current prosthetic wear tolerance (days/week)   increased prosthesis to 7 of 12 days up to 4hrs but only 1x/day. Liner daily >8hrs.     Current prosthetic wear tolerance (#hours/day)   up to 4hrs removes when needs to toilet. She has progressed to wearing while sitting not just while practicing as PT has recommended.     Residual limb condition   no issues per pt.    Education Provided  Proper wear schedule/adjustment;Proper weight-bearing schedule/adjustment;Proper Donning;Residual limb care;Correct ply sock adjustment    Person(s) Educated  Patient     Education Method  Explanation;Demonstration;Tactile cues;Verbal cues    Education Method  Verbalized understanding;Verbal cues required;Needs further instruction    Donning Prosthesis  Supervision    Doffing Prosthesis  Modified independent (device/increased time)               PT Short Term Goals - 10/14/18 1651      PT SHORT TERM GOAL #1   Title  Patient transfers stand pivot with RW between chair with armrests & chair without armrest using UEs with supervision. (ALL STGs updated [place] Date:11/08/2018)    Time  4    Period  Weeks    Status  On-going    Target Date  11/08/18      PT SHORT TERM GOAL #2   Title  Patient tolerates wearing  prosthesis >/= 5 day/wk  >3hrs/day & liner >8hrs total /day without skin issues.    Time  4    Period  Weeks    Status  On-going    Target Date  11/08/18      PT SHORT TERM GOAL #3   Title  Patient demonstrates ability to toilet with prosthesis including managing pants & sit/stand with supervision.    Time  4    Period  Weeks    Status  On-going    Target Date  11/08/18      PT SHORT TERM GOAL #4   Title  Patient ambulates 100' with RW & prosthesis with supervision.    Time  4    Period  Weeks    Status  On-going    Target Date  11/08/18      PT SHORT TERM GOAL #5   Title  Standing balance with RW support reaches 5" anteriorly & pick up item from floor with supervision.    Time  4    Period  Weeks    Status  On-going    Target Date  11/08/18      PT SHORT TERM GOAL #6   Title  Patient negotiates ramps & curbs with RW & prosthesis with minimal assist.    Time  4    Period  Weeks    Status  On-going    Target Date  11/08/18        PT Long Term Goals - 10/08/18 0841      PT LONG TERM GOAL #1   Title  Patient demonstrates & verbalizes proper prosthetic care to enable safe use of prosthesis.  (All LTGs Target Date: 01/03/2019)    Time  12    Period  Weeks    Status  On-going    Target Date  01/03/19      PT LONG TERM  GOAL #2   Title  Patient tolerates prosthesis wear >80% of awake hours to improve ability to function during her day.    Time  12    Period  Weeks    Status  On-going    Target Date  01/03/19      PT LONG TERM GOAL #3   Title  Patient standing balance with RW support & prosthesis: static no UE support 1 minute, dynamic RW support scans, reaches 7" anteriorly & manages clothes modified independent.    Time  12    Period  Weeks    Status  On-going    Target Date  01/03/19      PT LONG TERM GOAL #4   Title  Patient ambulates 200' with RW & prosthesis modifiied independent.    Time  12    Period  Weeks    Status  On-going    Target Date  01/03/19      PT LONG TERM GOAL #5   Title  Patient negotiates ramps & curbs with RW & prosthesis modified independent.    Time  12    Period  Weeks    Status  On-going    Target Date  01/03/19            Plan - 11/04/18 2148    Clinical Impression Statement  PT educated patient in using prosthesis for stationary standing ADLs.  Pt to take pictures of her 2 bathrooms to help problem solve toileting issues with prosthesis.    Personal Factors and Comorbidities  Age;Comorbidity 3+;Fitness;Past/Current Experience;Time since onset of  injury/illness/exacerbation;Other;Transportation   lives alone so limited ability to practice tasks that require assist or supervision   Comorbidities  R TFA, L4-5 decompression sg 2016, DM, HTN,    Examination-Activity Limitations  Bend;Caring for Others;Carry;Locomotion Level;Reach Overhead;Stairs;Stand;Toileting;Transfers    Examination-Participation Restrictions  Community Activity;Church;Meal Prep    Stability/Clinical Decision Making  Evolving/Moderate complexity    Rehab Potential  Good    PT Frequency  2x / week    PT Duration  12 weeks    PT Treatment/Interventions  ADLs/Self Care Home Management;DME Instruction;Gait training;Stair training;Functional mobility training;Therapeutic activities;Therapeutic  exercise;Balance training;Neuromuscular re-education;Patient/family education;Prosthetic Training;Vestibular;Passive range of motion;Manual techniques    PT Next Visit Plan  work towards updated STGs, check on toileting & wear    PT Home Exercise Plan  [employer] Access Code: [email or web address]    Consulted and Agree with Plan of Care  Patient       Patient will benefit from skilled therapeutic intervention in order to improve the following deficits and impairments:  Abnormal gait, Decreased activity tolerance, Decreased balance, Decreased endurance, Decreased knowledge of use of DME, Decreased mobility, Decreased range of motion, Decreased strength, Dizziness, Increased edema, Impaired flexibility, Postural dysfunction, Prosthetic Dependency, Pain, Obesity  Visit Diagnosis: Unsteadiness on feet  Abnormal posture  Other abnormalities of gait and mobility  Muscle weakness (generalized)  Stiffness of right hip, not elsewhere classified     Problem List Patient Active Problem List   Diagnosis Date Noted  . PAD (peripheral artery disease) ([facility]) 03/23/2018  . Back pain 09/09/2014  . HYPERLIPIDEMIA 12/18/2006  . RECTAL POLYPS 12/18/2006  . 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 SBO 08/01/2006  . GOITER NOS 03/11/2006  . PERIPHERAL NEUROPATHY 03/11/2006  . HYPERTENSION 03/11/2006  . OSTEOPENIA 03/11/2006  . FOOT DROP 03/11/2006    Vladimir FasterWALDRON,Edras Wilford PT, DPT 11/04/2018, 9:50 PM  [facility] [address] [place], [place], [address] Phone: [phone]   Fax:  [phone]  Name: Emma [name] MRN: [id number] Date of Birth: [birth year]

## 2018-11-06 ENCOUNTER — Ambulatory Visit: Payer: Medicare HMO | Admitting: Physical Therapy

## 2018-11-11 ENCOUNTER — Ambulatory Visit: Payer: Medicare HMO | Admitting: Family

## 2018-11-11 ENCOUNTER — Other Ambulatory Visit: Payer: Self-pay

## 2018-11-11 ENCOUNTER — Ambulatory Visit: Payer: Medicare HMO | Admitting: Physical Therapy

## 2018-11-11 ENCOUNTER — Encounter: Payer: Self-pay | Admitting: Physical Therapy

## 2018-11-11 DIAGNOSIS — R2689 Other abnormalities of gait and mobility: Secondary | ICD-10-CM

## 2018-11-11 DIAGNOSIS — M25651 Stiffness of right hip, not elsewhere classified: Secondary | ICD-10-CM

## 2018-11-11 DIAGNOSIS — R293 Abnormal posture: Secondary | ICD-10-CM | POA: Diagnosis not present

## 2018-11-11 DIAGNOSIS — M6281 Muscle weakness (generalized): Secondary | ICD-10-CM | POA: Diagnosis not present

## 2018-11-11 DIAGNOSIS — R2681 Unsteadiness on feet: Secondary | ICD-10-CM

## 2018-11-11 NOTE — Therapy (Signed)
[facility] [address] [place], [place], [address] Phone: [phone]   Fax:  [phone]  Physical Therapy Treatment  Patient Details  Name: Emma [name] MRN: [id number] Date of Birth: Sep 19, 1945 Referring Provider (PT): Benito Mccreedy, MD   Encounter Date: 11/11/2018  PT End of Session - 11/11/18 1352    Visit Number  19    Number of Visits  25    Date for PT Re-Evaluation  01/03/19    Authorization Type  [employer]  Medicare - 10th visit PN required    PT Start Time  1236    PT Stop Time  1328    PT Time Calculation (min)  52 min    Equipment Utilized During Treatment  Other (comment)   BSC   Activity Tolerance  Patient tolerated treatment well    Behavior During Therapy  [facility] for tasks assessed/performed       Past Medical History:  Diagnosis Date  . Arthritis    Back and right shoulder  . Constipation   . Diabetes mellitus    Type II  . Diabetes mellitus without complication ([place])   . Diabetic neuropathy ([place])   . History of blood transfusion   . Hypercholesteremia   . Hypertension   . Hypothyroidism   . Pneumonia   . Thyroid disease     Past Surgical History:  Procedure Laterality Date  . ABDOMINAL AORTOGRAM N/A 03/25/2018   Procedure: ABDOMINAL AORTOGRAM;  Surgeon: Waynetta Sandy, MD;  Location: [place] [facility];  Service: Cardiovascular;  Laterality: N/A;  . ABDOMINAL HYSTERECTOMY    . AMPUTATION Right 03/29/2018   Procedure: AMPUTATION ABOVE KNEE;  Surgeon: Serafina Mitchell, MD;  Location: [place];  Service: Vascular;  Laterality: Right;  . APPENDECTOMY    . BACK SURGERY    . BYPASS GRAFT FEMORAL-PERONEAL Right 03/26/2018   Procedure: BYPASS GRAFT [place];  Surgeon: Waynetta Sandy, MD;  Location: [place];  Service: Vascular;  Laterality: Right;  . EXPLORATORY LAPAROTOMY     Adhesions removed and appendix  . FEMORAL-POPLITEAL BYPASS GRAFT Right 03/27/2018    Procedure: Right lower extremity angiogram, Right Femoral Peroneal artery bypass, Angioplasty Right Femoral Peroneal bypass, Thrombectomy of Femoral peroneal artery;  Surgeon: Waynetta Sandy, MD;  Location: [place];  Service: Vascular;  Laterality: Right;  . INTRAOPERATIVE ARTERIOGRAM Right 03/26/2018   Procedure: Intra Operative Arteriogram;  Surgeon: Waynetta Sandy, MD;  Location: [place];  Service: Vascular;  Laterality: Right;  . LOWER EXTREMITY ANGIOGRAPHY Bilateral 03/25/2018   Procedure: LOWER EXTREMITY ANGIOGRAPHY;  Surgeon: Waynetta Sandy, MD;  Location: [place] [facility];  Service: Cardiovascular;  Laterality: Bilateral;  . LUMBAR LAMINECTOMY/DECOMPRESSION MICRODISCECTOMY N/A 09/09/2014   Procedure: L4-L5 DECOMPRESSION ;  Surgeon: Melina Schools, MD;  Location: [place];  Service: Orthopedics;  Laterality: N/A;  . PATCH ANGIOPLASTY Right 03/26/2018   Procedure: PATCH ANGIOPLASTY;  Surgeon: Waynetta Sandy, MD;  Location: [place];  Service: Vascular;  Laterality: Right;  . SHOULDER SURGERY    . THYROID SURGERY    . TONSILLECTOMY    . TOTAL THYROIDECTOMY    . VEIN HARVEST Right 03/26/2018   Procedure: Vein Harvest of Right Leg Greater Saphenous Vein in SITU;  Surgeon: Waynetta Sandy, MD;  Location: [place];  Service: Vascular;  Laterality: Right;    There were no vitals filed for this visit.  Subjective Assessment - 11/11/18 1244    Subjective  Has worn her prosthesis 4/7  days the past week.  Stil getting mm spasms after prolonged sitting in R distal limb when not wearing the leg.  Raising the R limb up helps the spasm.  Does not get the spasm when wearing the prosthesis.    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  No/denies          Prosthetics Assessment - 11/11/18 1337       Prosthetics   Prosthetic Care Dependent with  Correct ply sock adjustment;Proper wear schedule/adjustment;Proper weight-bearing schedule/adjustment    Prosthetic Care Comments   Pt is up to wearing prosthesis 4 hours a day; mian barrier to wearing more hours is using the restroom due to having to remove prosthesis    Donning prosthesis   Min assist    Doffing prosthesis   Min assist    Current prosthetic wear tolerance (days/week)   4 hours/day; 4 days/week    Current prosthetic weight-bearing tolerance (hours/day)   reports some mild burning along incision when wearing prosthesis.  Discussed how nerves become extra sensitive to mechanical pressure after surgery and that over time, as she is able to wear the prosthesis more that sensitivity to pressure should improve    K code/activity level with prosthetic use   Cues today to sequence re-donning shoe after removing to thread prosthesis through pant leg.  Pt had donned prosthesis and then was attempting to bring the foot up or bend down to foot to don shoe but was unable.  Removed prosthesis and donned shoe before donning socket and then had pt tighten suspender strap in sitting and then in standing with WB to fasten strap fully.  Strap did not hang over edge of velcro today.  Discussed how 2 ply was likely not enought causing pt to bottom out in socket and how three ply allowed correct fit in socket.                    [facility] PT Treatment/Exercise - 11/11/18 1337      Transfers   Transfers  Sit to Stand;Stand to Sit;Squat Pivot Transfers    Sit to Stand  5: Supervision;Without upper extremity assist    Sit to Stand Details (indicate cue type and reason)  cues for prosthetic foot placement under BOS when standing for improved balance and support    Stand to Sit  5: Supervision    Squat Pivot Transfers  4: Min guard    Squat Pivot Transfer Details (indicate cue type and reason)  Performed squat pivot w/c <> BSC set up to  simulate master bathroom.  Required min A for balance and safety due to pt attempting to pivot to L to [facility] and back to the R to w/c with R foot not positioned under BOS affecting patient's balance with weight shifting during pivot.      Therapeutic Activites    Therapeutic Activities  Other Therapeutic Activities    Other Therapeutic Activities  Most of session spent discussing barriers to wearing prosthesis >4 hours a day and wearing prosthesis in her bathroom.  Pt did not bring a picture of her bathroom but able to described and PT set up in treatment room.  Pt enters bathroom with shelf to her R; has to turn R and toilet is facing her.  Unable to pull w/c straight upt to toilet due to shelf sticking out too far.  Has to approach toilet at an angle and when she sits on the toilet she has to sit with knees off to the R to avoid hitting the cabinet.  It is not an option for pt to move cabinet or get a smaller cabinet.  Raised toilet seat is fixed to the toilet with screws and is not drop arm so can't approach laterally.  When performing simulated pivot pt does hit cabinet with R prosthetic knee.  Currently pt is only able to use her bathroom when she does not have her prosthesis donned.   Pt feels even if she were to walk into her bathroom with prosthesis and RW she would not have room to pivot with RW due to cabinet depth.  Will continue to assess ability to walk into her bathroom as gait and balance improve.  Pt's goal is to wear the prosthesis all day and pt does agree that using the guest bathroom during the day will be the best way for her to be independent with toileting and wearing the prosthesis during the day.  Pt has not tried transferring to toilet in guest bathroom.  Pt does have extra BSC that she can put in guest bathroom.  Pt is willing to practice guest bathroom set up and pivoting at next session.             PT Education - 11/11/18 1352    Education Details  see TA and prosthetic  flow sheet    Person(s) Educated  Patient    Methods  Explanation;Demonstration    Comprehension  Need further instruction       PT Short Term Goals - 11/11/18 1245      PT SHORT TERM GOAL #1   Title  Patient transfers stand pivot with RW between chair with armrests & chair without armrest using UEs with supervision. (ALL STGs updated for target date of 12/11/18)    Time  4    Period  Weeks    Status  On-going    Target Date  12/11/18      PT SHORT TERM GOAL #2   Title  Patient tolerates wearing prosthesis >/= 5 day/wk  >3hrs/day & liner >8hrs total /day without skin issues.    Baseline  wearing 4 hours a day, 4 days a week.    Time  4    Period  Weeks    Status  Not Met    Target Date  12/11/18      PT SHORT TERM GOAL #3   Title  Patient demonstrates ability to toilet with prosthesis including managing pants & sit/stand with supervision.    Time  4    Period  Weeks    Status  Not Met    Target Date  12/11/18      PT SHORT TERM GOAL #4   Title  Patient ambulates 100' with RW & prosthesis with supervision.    Time  4    Period  Weeks    Status  On-going    Target Date  12/11/18      PT SHORT TERM GOAL #5   Title  Standing balance with RW support reaches 5" anteriorly & pick up item from floor with supervision.    Time  4    Period  Weeks    Status  On-going    Target Date  12/11/18  PT SHORT TERM GOAL #6   Title  Patient negotiates ramps & curbs with RW & prosthesis with minimal assist.    Time  4    Period  Weeks    Status  On-going    Target Date  12/11/18        PT Long Term Goals - 10/08/18 0841      PT LONG TERM GOAL #1   Title  Patient demonstrates & verbalizes proper prosthetic care to enable safe use of prosthesis.  (All LTGs Target Date: 01/03/2019)    Time  12    Period  Weeks    Status  On-going    Target Date  01/03/19      PT LONG TERM GOAL #2   Title  Patient tolerates prosthesis wear >80% of awake hours to improve ability to function  during her day.    Time  12    Period  Weeks    Status  On-going    Target Date  01/03/19      PT LONG TERM GOAL #3   Title  Patient standing balance with RW support & prosthesis: static no UE support 1 minute, dynamic RW support scans, reaches 7" anteriorly & manages clothes modified independent.    Time  12    Period  Weeks    Status  On-going    Target Date  01/03/19      PT LONG TERM GOAL #4   Title  Patient ambulates 200' with RW & prosthesis modifiied independent.    Time  12    Period  Weeks    Status  On-going    Target Date  01/03/19      PT LONG TERM GOAL #5   Title  Patient negotiates ramps & curbs with RW & prosthesis modified independent.    Time  12    Period  Weeks    Status  On-going    Target Date  01/03/19            Plan - 11/11/18 1353    Clinical Impression Statement  Initiated assessment of STG; assessed STG #2 and #3 related to wear schedule and bathroom.  Pt continues to require cues to sequence donning prosthesis when wearing pants and continues to wear prosthesis 4 hours a day due to bathroom set up.  Simulated master bathroom set up and based on set up today pt is not able to safely squat pivot onto master toilet from w/c with prosthesis donned; may be able to use once pt is safe with ambulation with RW and prosthesis.  Pt to continue to use master bathroom without prosthesis but will begin to practice safe transfers to/from toilet in guest bathroom so that pt can begin to wear prosthesis for >4 hours a day and be more independent with toileting with prosthesis donned.    Personal Factors and Comorbidities  Age;Comorbidity 3+;Fitness;Past/Current Experience;Time since onset of injury/illness/exacerbation;Other;Transportation   lives alone so limited ability to practice tasks that require assist or supervision   Comorbidities  R TFA, L4-5 decompression sg 2016, DM, HTN,    Examination-Activity Limitations  Bend;Caring for Others;Carry;Locomotion  Level;Reach Overhead;Stairs;Stand;Toileting;Transfers    Examination-Participation Restrictions  Community Activity;Church;Meal Prep    Stability/Clinical Decision Making  Evolving/Moderate complexity    Rehab Potential  Good    PT Frequency  2x / week    PT Duration  12 weeks    PT Treatment/Interventions  ADLs/Self Care Home Management;DME Instruction;Gait training;Stair training;Functional mobility training;Therapeutic activities;Therapeutic  exercise;Balance training;Neuromuscular re-education;Patient/family education;Prosthetic Training;Vestibular;Passive range of motion;Manual techniques    PT Next Visit Plan  10th visit PN!  She was going to take a photo of guest bathroom so we can see set up.  To continue to address STG - use BSC and set up guest bathroom and practice approach and squat pivot with prosthesis w/c <> BSC and see if pt would be safe to begin to use guest bathroom at home with prosthesis.  She will need someone to help her put the [facility] over the guest toilet before she can start using it.  Keep working towards other STG.    PT Home Exercise Plan  Medbridge Access Code: [email or web address]    Consulted and Agree with Plan of Care  Patient       Patient will benefit from skilled therapeutic intervention in order to improve the following deficits and impairments:  Abnormal gait, Decreased activity tolerance, Decreased balance, Decreased endurance, Decreased knowledge of use of DME, Decreased mobility, Decreased range of motion, Decreased strength, Dizziness, Increased edema, Impaired flexibility, Postural dysfunction, Prosthetic Dependency, Pain, Obesity  Visit Diagnosis: Unsteadiness on feet  Abnormal posture  Other abnormalities of gait and mobility  Muscle weakness (generalized)  Stiffness of right hip, not elsewhere classified     Problem List Patient Active Problem List   Diagnosis Date Noted  . PAD (peripheral artery disease) ([place]) 03/23/2018  . Back pain 09/09/2014  .  HYPERLIPIDEMIA 12/18/2006  . RECTAL POLYPS 12/18/2006  . 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 SBO 08/01/2006  . GOITER NOS 03/11/2006  . PERIPHERAL NEUROPATHY 03/11/2006  . HYPERTENSION 03/11/2006  . OSTEOPENIA 03/11/2006  . FOOT DROP 03/11/2006    Rico Junker, PT, DPT 11/11/18    2:03 PM    [facility] [address] [place], [place], [address] Phone: [phone]   Fax:  [phone]  Name: Emma [name] MRN: [id number] Date of Birth: 1945-04-05

## 2018-11-12 ENCOUNTER — Encounter: Payer: Medicare HMO | Admitting: Physical Therapy

## 2018-11-13 ENCOUNTER — Other Ambulatory Visit: Payer: Self-pay

## 2018-11-13 ENCOUNTER — Ambulatory Visit: Payer: Medicare HMO

## 2018-11-13 DIAGNOSIS — R2689 Other abnormalities of gait and mobility: Secondary | ICD-10-CM | POA: Diagnosis not present

## 2018-11-13 DIAGNOSIS — M6281 Muscle weakness (generalized): Secondary | ICD-10-CM | POA: Diagnosis not present

## 2018-11-13 DIAGNOSIS — M25651 Stiffness of right hip, not elsewhere classified: Secondary | ICD-10-CM | POA: Diagnosis not present

## 2018-11-13 DIAGNOSIS — R2681 Unsteadiness on feet: Secondary | ICD-10-CM

## 2018-11-13 DIAGNOSIS — R293 Abnormal posture: Secondary | ICD-10-CM

## 2018-11-14 ENCOUNTER — Encounter: Payer: Medicare HMO | Admitting: Physical Therapy

## 2018-11-14 ENCOUNTER — Ambulatory Visit: Payer: Medicare HMO | Admitting: Rehabilitative and Restorative Service Providers"

## 2018-11-14 NOTE — Therapy (Signed)
[facility] [address] [place], [place], [address] Phone: [phone]   Fax:  [phone]  Physical Therapy Treatment/10th visit progress note  Patient Details  Name: Emma [name] MRN: [id number] Date of Birth: 05/11/1945 Referring Provider (PT): Benito Mccreedy, MD   Progress Note  Reporting period 09/30/2018 to 11/13/2018  See Note below for Objective Data and Assessment of Progress/Goals  Encounter Date: 11/13/2018  PT End of Session - 11/13/18 1707    Visit Number  20    Number of Visits  25    Date for PT Re-Evaluation  01/03/19    Authorization Type  [employer]  Medicare - 10th visit PN required    PT Start Time  1701    PT Stop Time  1745    PT Time Calculation (min)  44 min    Equipment Utilized During Treatment  Other (comment)   BSC   Activity Tolerance  Patient tolerated treatment well    Behavior During Therapy  [facility] for tasks assessed/performed       Past Medical History:  Diagnosis Date  . Arthritis    Back and right shoulder  . Constipation   . Diabetes mellitus    Type II  . Diabetes mellitus without complication ([place])   . Diabetic neuropathy ([place])   . History of blood transfusion   . Hypercholesteremia   . Hypertension   . Hypothyroidism   . Pneumonia   . Thyroid disease     Past Surgical History:  Procedure Laterality Date  . ABDOMINAL AORTOGRAM N/A 03/25/2018   Procedure: ABDOMINAL AORTOGRAM;  Surgeon: Waynetta Sandy, MD;  Location: [facility];  Service: Cardiovascular;  Laterality: N/A;  . ABDOMINAL HYSTERECTOMY    . AMPUTATION Right 03/29/2018   Procedure: AMPUTATION ABOVE KNEE;  Surgeon: Serafina Mitchell, MD;  Location: [facility];  Service: Vascular;  Laterality: Right;  . APPENDECTOMY    . BACK SURGERY    . BYPASS GRAFT FEMORAL-PERONEAL Right 03/26/2018   Procedure: BYPASS GRAFT [place];  Surgeon: Waynetta Sandy, MD;  Location: [facility];   Service: Vascular;  Laterality: Right;  . EXPLORATORY LAPAROTOMY     Adhesions removed and appendix  . FEMORAL-POPLITEAL BYPASS GRAFT Right 03/27/2018   Procedure: Right lower extremity angiogram, Right Femoral Peroneal artery bypass, Angioplasty Right Femoral Peroneal bypass, Thrombectomy of Femoral peroneal artery;  Surgeon: Waynetta Sandy, MD;  Location: [facility];  Service: Vascular;  Laterality: Right;  . INTRAOPERATIVE ARTERIOGRAM Right 03/26/2018   Procedure: Intra Operative Arteriogram;  Surgeon: Waynetta Sandy, MD;  Location: [facility];  Service: Vascular;  Laterality: Right;  . LOWER EXTREMITY ANGIOGRAPHY Bilateral 03/25/2018   Procedure: LOWER EXTREMITY ANGIOGRAPHY;  Surgeon: Waynetta Sandy, MD;  Location: [facility];  Service: Cardiovascular;  Laterality: Bilateral;  . LUMBAR LAMINECTOMY/DECOMPRESSION MICRODISCECTOMY N/A 09/09/2014   Procedure: L4-L5 DECOMPRESSION ;  Surgeon: Melina Schools, MD;  Location: [facility];  Service: Orthopedics;  Laterality: N/A;  . PATCH ANGIOPLASTY Right 03/26/2018   Procedure: PATCH ANGIOPLASTY;  Surgeon: Waynetta Sandy, MD;  Location: [facility];  Service: Vascular;  Laterality: Right;  . SHOULDER SURGERY    . THYROID SURGERY    . TONSILLECTOMY    . TOTAL THYROIDECTOMY    . VEIN HARVEST Right 03/26/2018   Procedure: Vein Harvest of Right Leg Greater Saphenous Vein in SITU;  Surgeon: Waynetta Sandy, MD;  Location: [facility];  Service: Vascular;  Laterality: Right;    There  were no vitals filed for this visit.  Subjective Assessment - 11/13/18 1709    Subjective  Pt reports she forgot to take picture of bathroom. Transfer would be to the right most likle and bsc that she will have friend put over this weekend does not have drop arm.    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  No/denies          Prosthetics Assessment - 11/14/18 0001      Prosthetics   Prosthetic Care Dependent with  Correct ply sock adjustment;Proper wear schedule/adjustment;Proper weight-bearing schedule/adjustment    Prosthetic Care Comments   Pt is up to wearing prosthesis 4 hours a day; mian barrier to wearing more hours is using the restroom due to having to remove prosthesis    Donning prosthesis   Min assist    Doffing prosthesis   Min assist    Current prosthetic wear tolerance (days/week)   4 hours/day; 4 days/week    Current prosthetic weight-bearing tolerance (hours/day)   reports some mild burning along incision when wearing prosthesis.      K code/activity level with prosthetic use   Pt required multiple attempts to donn prosthesis today and struggled with getting straight and prosthesis was initially internally rotated. PT had her remove and try again. Pt was using 3ply socks today but felt she may have needed 2ply as was having more trouble getting leg to fit down in socket. Pt had dificulty with the sequencing that was instructed from prior session and ended up donning leg prior to even puttting pant leg or shoe on. PT assisted to donn pants and shoe in interest in time and reminded patient on correct sequencing.                  [facility] PT Treatment/Exercise - 11/13/18 1741      Transfers   Transfers  Sit to Stand;Stand to Sit;Stand Pivot Transfers    Sit to Stand  5: Supervision   x6 from wheelchair when donning prosthesis   Sit to Stand Details  Verbal cues for technique;Other (comment)   PT stabilized walker   Stand to Sit  5: Supervision    Stand to Sit Details  Pt was given verbal cues to try to unlock prosthesis prior to sitting    Stand Pivot Transfers  4: Min assist   x 2 w/c to/from bsc    Stand Pivot Transfer Details (indicate cue type and reason)  Pt was instructed in hand placement holding to w/c armrest and bsc  handle. Pt was instructed to lock prosthesis and try to weight shift on to take 1 small step to turn. Pt not trusting of leg and needed min assist for safety and to stabilize bsc. PT suggested patient see if possible to get grab bar installed on right side of toilet just above toilet paper roll. Pt said she would check on this.             PT Education - 11/14/18 [phone]    Education Details  See prosthetic flow sheet    Person(s) Educated  Patient    Methods  Explanation;Demonstration    Comprehension  Need further instruction       PT Short Term Goals - 11/13/18 1742  PT SHORT TERM GOAL #1   Title  Patient transfers stand pivot with RW between chair with armrests & chair without armrest using UEs with supervision. (ALL STGs updated for target date of 12/11/18)    Baseline  Pt needed assist with stand pivot today w/c to bsc min assist. Difficulty weight shifting over prosthesis but was not using walker as trying to simulate home set up.    Time  4    Period  Weeks    Status  On-going    Target Date  12/11/18      PT SHORT TERM GOAL #2   Title  Patient tolerates wearing prosthesis >/= 5 day/wk  >3hrs/day & liner >8hrs total /day without skin issues.    Baseline  wearing 4 hours a day, 4 days a week.    Time  4    Period  Weeks    Status  Not Met    Target Date  12/11/18      PT SHORT TERM GOAL #3   Title  Patient demonstrates ability to toilet with prosthesis including managing pants & sit/stand with supervision.    Baseline  Pt currently not able to perform toilet transfers at home with prosthesis due to bathroom set up. Trying to see if she can use guest bathroom. Was CGA with pulling pants up and down at bsc today.    Time  4    Period  Weeks    Status  Not Met    Target Date  12/11/18      PT SHORT TERM GOAL #4   Title  Patient ambulates 100' with RW & prosthesis with supervision.    Baseline  not assessed at visit today    Time  4    Period  Weeks    Status   On-going    Target Date  12/11/18      PT SHORT TERM GOAL #5   Title  Standing balance with RW support reaches 5" anteriorly & pick up item from floor with supervision.    Baseline  not assessed today    Time  4    Period  Weeks    Status  On-going    Target Date  12/11/18      PT SHORT TERM GOAL #6   Title  Patient negotiates ramps & curbs with RW & prosthesis with minimal assist.    Baseline  not assessed today    Time  4    Period  Weeks    Status  On-going    Target Date  12/11/18        PT Long Term Goals - 10/08/18 0841      PT LONG TERM GOAL #1   Title  Patient demonstrates & verbalizes proper prosthetic care to enable safe use of prosthesis.  (All LTGs Target Date: 01/03/2019)    Time  12    Period  Weeks    Status  On-going    Target Date  01/03/19      PT LONG TERM GOAL #2   Title  Patient tolerates prosthesis wear >80% of awake hours to improve ability to function during her day.    Time  12    Period  Weeks    Status  On-going    Target Date  01/03/19      PT LONG TERM GOAL #3   Title  Patient standing balance with RW support & prosthesis: static no UE support 1 minute, dynamic RW support  scans, reaches 7" anteriorly & manages clothes modified independent.    Time  12    Period  Weeks    Status  On-going    Target Date  01/03/19      PT LONG TERM GOAL #4   Title  Patient ambulates 200' with RW & prosthesis modifiied independent.    Time  12    Period  Weeks    Status  On-going    Target Date  01/03/19      PT LONG TERM GOAL #5   Title  Patient negotiates ramps & curbs with RW & prosthesis modified independent.    Time  12    Period  Weeks    Status  On-going    Target Date  01/03/19            Plan - 11/14/18 0659    Clinical Impression Statement  PT continued assesssment of STG involving prosthetic wear and transfers/toileting. Pt requires increased time to donn leg and is continuing to have trouble with sequencing. Pt states she is  wearing leg 4 hours/day but has not had on yet today due to work and she did not want to leave leg on when finished session. Pt unsteady with stand/pivot transfers needing min assist with difficulty shifting weight over prosthesis stating she does not trust leg. Pt will benefit from continued PT to improve transfers and gait with prosthesis and improve management of donning/doffing.    Personal Factors and Comorbidities  Age;Comorbidity 3+;Fitness;Past/Current Experience;Time since onset of injury/illness/exacerbation;Other;Transportation   lives alone so limited ability to practice tasks that require assist or supervision   Comorbidities  R TFA, L4-5 decompression sg 2016, DM, HTN,    Examination-Activity Limitations  Bend;Caring for Others;Carry;Locomotion Level;Reach Overhead;Stairs;Stand;Toileting;Transfers    Examination-Participation Restrictions  Community Activity;Church;Meal Prep    Stability/Clinical Decision Making  Evolving/Moderate complexity    Rehab Potential  Good    PT Frequency  2x / week    PT Duration  12 weeks    PT Treatment/Interventions  ADLs/Self Care Home Management;DME Instruction;Gait training;Stair training;Functional mobility training;Therapeutic activities;Therapeutic exercise;Balance training;Neuromuscular re-education;Patient/family education;Prosthetic Training;Vestibular;Passive range of motion;Manual techniques    PT Next Visit Plan  See if she has set up guest bathroom to allow transfer with prosthesis. Work on stand pivot transfers. Gait training with prosthesis.    PT Home Exercise Plan  Medbridge Access Code: [email or web address]    Consulted and Agree with Plan of Care  Patient       Patient will benefit from skilled therapeutic intervention in order to improve the following deficits and impairments:  Abnormal gait, Decreased activity tolerance, Decreased balance, Decreased endurance, Decreased knowledge of use of DME, Decreased mobility, Decreased range of motion,  Decreased strength, Dizziness, Increased edema, Impaired flexibility, Postural dysfunction, Prosthetic Dependency, Pain, Obesity  Visit Diagnosis: Unsteadiness on feet  Abnormal posture  Muscle weakness (generalized)     Problem List Patient Active Problem List   Diagnosis Date Noted  . PAD (peripheral artery disease) ([place]) 03/23/2018  . Back pain 09/09/2014  . HYPERLIPIDEMIA 12/18/2006  . RECTAL POLYPS 12/18/2006  . 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 SBO 08/01/2006  . GOITER NOS 03/11/2006  . PERIPHERAL NEUROPATHY 03/11/2006  . HYPERTENSION 03/11/2006  . OSTEOPENIA 03/11/2006  . FOOT DROP 03/11/2006    Electa Sniff, PT, DPT, NCS 11/14/2018, 7:03 AM  [facility] [address] [place], [place], [address] Phone: [phone]   Fax:  [phone]  Name: Emma [name] MRN:  [id number] Date of Birth: Apr 12, 1945

## 2018-11-18 ENCOUNTER — Other Ambulatory Visit: Payer: Self-pay

## 2018-11-18 ENCOUNTER — Ambulatory Visit: Payer: Medicare HMO | Attending: Internal Medicine | Admitting: Physical Therapy

## 2018-11-18 ENCOUNTER — Encounter: Payer: Self-pay | Admitting: Physical Therapy

## 2018-11-18 DIAGNOSIS — R293 Abnormal posture: Secondary | ICD-10-CM | POA: Diagnosis not present

## 2018-11-18 DIAGNOSIS — M6281 Muscle weakness (generalized): Secondary | ICD-10-CM | POA: Diagnosis not present

## 2018-11-18 DIAGNOSIS — M25651 Stiffness of right hip, not elsewhere classified: Secondary | ICD-10-CM | POA: Diagnosis not present

## 2018-11-18 DIAGNOSIS — R2681 Unsteadiness on feet: Secondary | ICD-10-CM | POA: Diagnosis not present

## 2018-11-18 DIAGNOSIS — R2689 Other abnormalities of gait and mobility: Secondary | ICD-10-CM | POA: Insufficient documentation

## 2018-11-18 NOTE — Therapy (Signed)
[facility] [address] [place], [place], [address] Phone: [phone]   Fax:  [phone]  Physical Therapy Treatment  Patient Details  Name: Emma [name] MRN: [id number] Date of Birth: Oct 07, 1945 Referring Provider (PT): Benito Mccreedy, MD   Encounter Date: 11/18/2018  PT End of Session - 11/18/18 1532    Visit Number  21    Number of Visits  38    Date for PT Re-Evaluation  01/03/19    Authorization Type  [employer]  Medicare - 10th visit PN required    PT Start Time  1230    PT Stop Time  1315    PT Time Calculation (min)  45 min    Equipment Utilized During Treatment  Other (comment)   BSC   Activity Tolerance  Patient tolerated treatment well    Behavior During Therapy  [facility] for tasks assessed/performed       Past Medical History:  Diagnosis Date  . Arthritis    Back and right shoulder  . Constipation   . Diabetes mellitus    Type II  . Diabetes mellitus without complication ([place])   . Diabetic neuropathy ([place])   . History of blood transfusion   . Hypercholesteremia   . Hypertension   . Hypothyroidism   . Pneumonia   . Thyroid disease     Past Surgical History:  Procedure Laterality Date  . ABDOMINAL AORTOGRAM N/A 03/25/2018   Procedure: ABDOMINAL AORTOGRAM;  Surgeon: Waynetta Sandy, MD;  Location: [facility];  Service: Cardiovascular;  Laterality: N/A;  . ABDOMINAL HYSTERECTOMY    . AMPUTATION Right 03/29/2018   Procedure: AMPUTATION ABOVE KNEE;  Surgeon: Serafina Mitchell, MD;  Location: [facility];  Service: Vascular;  Laterality: Right;  . APPENDECTOMY    . BACK SURGERY    . BYPASS GRAFT FEMORAL-PERONEAL Right 03/26/2018   Procedure: BYPASS GRAFT [place];  Surgeon: Waynetta Sandy, MD;  Location: [facility];  Service: Vascular;  Laterality: Right;  . EXPLORATORY LAPAROTOMY     Adhesions removed and appendix  . FEMORAL-POPLITEAL BYPASS GRAFT Right 03/27/2018   Procedure: Right lower extremity angiogram, Right Femoral Peroneal artery bypass, Angioplasty Right Femoral Peroneal bypass, Thrombectomy of Femoral peroneal artery;  Surgeon: Waynetta Sandy, MD;  Location: [facility];  Service: Vascular;  Laterality: Right;  . INTRAOPERATIVE ARTERIOGRAM Right 03/26/2018   Procedure: Intra Operative Arteriogram;  Surgeon: Waynetta Sandy, MD;  Location: [facility];  Service: Vascular;  Laterality: Right;  . LOWER EXTREMITY ANGIOGRAPHY Bilateral 03/25/2018   Procedure: LOWER EXTREMITY ANGIOGRAPHY;  Surgeon: Waynetta Sandy, MD;  Location: [facility];  Service: Cardiovascular;  Laterality: Bilateral;  . LUMBAR LAMINECTOMY/DECOMPRESSION MICRODISCECTOMY N/A 09/09/2014   Procedure: L4-L5 DECOMPRESSION ;  Surgeon: Melina Schools, MD;  Location: [facility];  Service: Orthopedics;  Laterality: N/A;  . PATCH ANGIOPLASTY Right 03/26/2018   Procedure: PATCH ANGIOPLASTY;  Surgeon: Waynetta Sandy, MD;  Location: [facility];  Service: Vascular;  Laterality: Right;  . SHOULDER SURGERY    . THYROID SURGERY    . TONSILLECTOMY    . TOTAL THYROIDECTOMY    . VEIN HARVEST Right 03/26/2018   Procedure: Vein Harvest of Right Leg Greater Saphenous Vein in SITU;  Surgeon: Waynetta Sandy, MD;  Location: [facility];  Service: Vascular;  Laterality: Right;    There were no vitals filed for this visit.  Subjective Assessment - 11/18/18 1230    Subjective  She is wearing prosthesis ~5 days/wk  for ~4hrs /day. Toileting with prosthesis is still the biggest limitation.    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  No/denies                       [facility] Adult PT Treatment/Exercise - 11/18/18 1230      Transfers   Transfers  Sit to Stand;Stand to Sit    Sit to Stand  5:  Supervision;4: Min guard;With upper extremity assist;With armrests;From chair/3-in-1   Min guard chairs without armrests, to RW   Sit to Stand Details  Verbal cues for technique;Other (comment);Verbal cues for safe use of DME/AE;Visual cues for safe use of DME/AE    Stand to Sit  5: Supervision;With upper extremity assist;With armrests;To chair/3-in-1   to chairs with & without armrests   Stand to Sit Details (indicate cue type and reason)  Visual cues for safe use of DME/AE;Verbal cues for safe use of DME/AE      Ambulation/Gait   Ambulation/Gait  Yes    Ambulation/Gait Assistance  5: Supervision    Ambulation/Gait Assistance Details  verbal & tactile cues on timing of initial contact for prosthetic knee extension, upright posture, proper step width / not adducting creating hip instability and wt shift over prosthesis.     Ambulation Distance (Feet)  75 Feet    Assistive device  Rolling walker;Prosthesis    Gait Pattern  Step-to pattern;Decreased step length - left;Decreased stance time - right;Decreased hip/knee flexion - right;Decreased weight shift to right;Right steppage;Antalgic;Lateral hip instability;Trunk flexed    Ambulation Surface  Indoor;Level    Ramp  3: Mod assist   RW & prosthesis   Ramp Details (indicate cue type and reason)  manual & tactile cues on technique of upright posture & wt shift      Self-Care   ADL's  Patient had pictures of guest bathroom. PT recommended taking up rug in front of toilet and place BSC over toilet. May need to remove back bar of BSC or toilet seat to align [facility] over toilet opening. Pt verbalized understanding.       Prosthetics   Prosthetic Care Comments   Increase wear to 2x/day to increase total wear.  Try BSC over guest bathroom toilet.     Current prosthetic wear tolerance (days/week)   6 days/wk.     Current prosthetic wear tolerance (#hours/day)   4hrs but only 1x/day (6 hrs 1x)     Residual limb condition   no issues per pt.    Education  Provided  Skin check;Residual limb care;Correct ply sock adjustment;Proper Donning;Proper wear schedule/adjustment    Person(s) Educated  Patient    Education Method  Explanation;Demonstration;Tactile cues;Verbal cues    Education Method  Returned demonstration;Verbalized understanding;Tactile cues required;Verbal cues required;Needs further instruction    Donning Prosthesis  Supervision    Doffing Prosthesis  Modified independent (device/increased time)               PT Short Term Goals - 11/13/18 1742      PT SHORT TERM GOAL #1   Title  Patient transfers stand pivot with RW between chair with armrests & chair without armrest using UEs with supervision. (ALL STGs updated for target date of 12/11/18)    Baseline  Pt needed assist with  stand pivot today w/c to bsc min assist. Difficulty weight shifting over prosthesis but was not using walker as trying to simulate home set up.    Time  4    Period  Weeks    Status  On-going    Target Date  12/11/18      PT SHORT TERM GOAL #2   Title  Patient tolerates wearing prosthesis >/= 5 day/wk  >3hrs/day & liner >8hrs total /day without skin issues.    Baseline  wearing 4 hours a day, 4 days a week.    Time  4    Period  Weeks    Status  Not Met    Target Date  12/11/18      PT SHORT TERM GOAL #3   Title  Patient demonstrates ability to toilet with prosthesis including managing pants & sit/stand with supervision.    Baseline  Pt currently not able to perform toilet transfers at home with prosthesis due to bathroom set up. Trying to see if she can use guest bathroom. Was CGA with pulling pants up and down at bsc today.    Time  4    Period  Weeks    Status  Not Met    Target Date  12/11/18      PT SHORT TERM GOAL #4   Title  Patient ambulates 100' with RW & prosthesis with supervision.    Baseline  not assessed at visit today    Time  4    Period  Weeks    Status  On-going    Target Date  12/11/18      PT SHORT TERM GOAL #5    Title  Standing balance with RW support reaches 5" anteriorly & pick up item from floor with supervision.    Baseline  not assessed today    Time  4    Period  Weeks    Status  On-going    Target Date  12/11/18      PT SHORT TERM GOAL #6   Title  Patient negotiates ramps & curbs with RW & prosthesis with minimal assist.    Baseline  not assessed today    Time  4    Period  Weeks    Status  On-going    Target Date  12/11/18        PT Long Term Goals - 10/08/18 0841      PT LONG TERM GOAL #1   Title  Patient demonstrates & verbalizes proper prosthetic care to enable safe use of prosthesis.  (All LTGs Target Date: 01/03/2019)    Time  12    Period  Weeks    Status  On-going    Target Date  01/03/19      PT LONG TERM GOAL #2   Title  Patient tolerates prosthesis wear >80% of awake hours to improve ability to function during her day.    Time  12    Period  Weeks    Status  On-going    Target Date  01/03/19      PT LONG TERM GOAL #3   Title  Patient standing balance with RW support & prosthesis: static no UE support 1 minute, dynamic RW support scans, reaches 7" anteriorly & manages clothes modified independent.    Time  12    Period  Weeks    Status  On-going    Target Date  01/03/19      PT LONG TERM GOAL #4  Title  Patient ambulates 200' with RW & prosthesis modifiied independent.    Time  12    Period  Weeks    Status  On-going    Target Date  01/03/19      PT LONG TERM GOAL #5   Title  Patient negotiates ramps & curbs with RW & prosthesis modified independent.    Time  12    Period  Weeks    Status  On-going    Target Date  01/03/19            Plan - 11/18/18 2213    Clinical Impression Statement  Today's PT session focused on progressing wear with verbal cues on toileting which is primary limiting factor. Patient improved ambulation distance with supervision only.    Personal Factors and Comorbidities  Age;Comorbidity 3+;Fitness;Past/Current  Experience;Time since onset of injury/illness/exacerbation;Other;Transportation   lives alone so limited ability to practice tasks that require assist or supervision   Comorbidities  R TFA, L4-5 decompression sg 2016, DM, HTN,    Examination-Activity Limitations  Bend;Caring for Others;Carry;Locomotion Level;Reach Overhead;Stairs;Stand;Toileting;Transfers    Examination-Participation Restrictions  Community Activity;Church;Meal Prep    Stability/Clinical Decision Making  Evolving/Moderate complexity    Rehab Potential  Good    PT Frequency  2x / week    PT Duration  12 weeks    PT Treatment/Interventions  ADLs/Self Care Home Management;DME Instruction;Gait training;Stair training;Functional mobility training;Therapeutic activities;Therapeutic exercise;Balance training;Neuromuscular re-education;Patient/family education;Prosthetic Training;Vestibular;Passive range of motion;Manual techniques    PT Next Visit Plan  ask if she tried [facility] in her guest bathroom, standing balance & gait towards STGs    PT Home Exercise Plan  Medbridge Access Code: [email or web address]    Consulted and Agree with Plan of Care  Patient       Patient will benefit from skilled therapeutic intervention in order to improve the following deficits and impairments:  Abnormal gait, Decreased activity tolerance, Decreased balance, Decreased endurance, Decreased knowledge of use of DME, Decreased mobility, Decreased range of motion, Decreased strength, Dizziness, Increased edema, Impaired flexibility, Postural dysfunction, Prosthetic Dependency, Pain, Obesity  Visit Diagnosis: Unsteadiness on feet  Abnormal posture  Muscle weakness (generalized)  Other abnormalities of gait and mobility  Stiffness of right hip, not elsewhere classified     Problem List Patient Active Problem List   Diagnosis Date Noted  . PAD (peripheral artery disease) ([place]) 03/23/2018  . Back pain 09/09/2014  . HYPERLIPIDEMIA 12/18/2006  . RECTAL POLYPS  12/18/2006  . 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 SBO 08/01/2006  . GOITER NOS 03/11/2006  . PERIPHERAL NEUROPATHY 03/11/2006  . HYPERTENSION 03/11/2006  . OSTEOPENIA 03/11/2006  . FOOT DROP 03/11/2006    Jamey Reas PT, DPT 11/18/2018, 10:18 PM  [facility] [address] [place], [place], [address] Phone: [phone]   Fax:  [phone]  Name: Emma [name] MRN: [id number] Date of Birth: 04-24-1945

## 2018-11-19 ENCOUNTER — Ambulatory Visit: Payer: Medicare HMO | Admitting: Physical Therapy

## 2018-11-20 ENCOUNTER — Ambulatory Visit: Payer: Medicare HMO | Admitting: Physical Therapy

## 2018-11-20 ENCOUNTER — Other Ambulatory Visit: Payer: Self-pay

## 2018-11-20 DIAGNOSIS — R293 Abnormal posture: Secondary | ICD-10-CM

## 2018-11-20 DIAGNOSIS — R2689 Other abnormalities of gait and mobility: Secondary | ICD-10-CM

## 2018-11-20 DIAGNOSIS — M6281 Muscle weakness (generalized): Secondary | ICD-10-CM

## 2018-11-20 DIAGNOSIS — R2681 Unsteadiness on feet: Secondary | ICD-10-CM

## 2018-11-20 DIAGNOSIS — M25651 Stiffness of right hip, not elsewhere classified: Secondary | ICD-10-CM | POA: Diagnosis not present

## 2018-11-21 ENCOUNTER — Encounter: Payer: Self-pay | Admitting: Physical Therapy

## 2018-11-21 NOTE — Therapy (Signed)
[facility] [address] [place], [place], [address] Phone: [phone]   Fax:  [phone]  Physical Therapy Treatment  Patient Details  Name: Emma [name] MRN: [id number] Date of Birth: July 08, 1945 Referring Provider (PT): Benito Mccreedy, MD   Encounter Date: 11/20/2018  PT End of Session - 11/20/18 1835    Visit Number  22    Number of Visits  38    Date for PT Re-Evaluation  01/03/19    Authorization Type  [employer]  Medicare - 10th visit PN required    PT Start Time  1700    PT Stop Time  1744    PT Time Calculation (min)  44 min    Equipment Utilized During Treatment  Other (comment)   BSC   Activity Tolerance  Patient tolerated treatment well    Behavior During Therapy  [facility] for tasks assessed/performed       Past Medical History:  Diagnosis Date  . Arthritis    Back and right shoulder  . Constipation   . Diabetes mellitus    Type II  . Diabetes mellitus without complication ([facility])   . Diabetic neuropathy ([place])   . History of blood transfusion   . Hypercholesteremia   . Hypertension   . Hypothyroidism   . Pneumonia   . Thyroid disease     Past Surgical History:  Procedure Laterality Date  . ABDOMINAL AORTOGRAM N/A 03/25/2018   Procedure: ABDOMINAL AORTOGRAM;  Surgeon: Waynetta Sandy, MD;  Location: [facility];  Service: Cardiovascular;  Laterality: N/A;  . ABDOMINAL HYSTERECTOMY    . AMPUTATION Right 03/29/2018   Procedure: AMPUTATION ABOVE KNEE;  Surgeon: Serafina Mitchell, MD;  Location: [facility];  Service: Vascular;  Laterality: Right;  . APPENDECTOMY    . BACK SURGERY    . BYPASS GRAFT FEMORAL-PERONEAL Right 03/26/2018   Procedure: BYPASS GRAFT [place];  Surgeon: Waynetta Sandy, MD;  Location: [facility];  Service: Vascular;  Laterality: Right;  . EXPLORATORY LAPAROTOMY     Adhesions removed and appendix  . FEMORAL-POPLITEAL BYPASS GRAFT Right 03/27/2018   Procedure: Right lower extremity angiogram, Right Femoral Peroneal artery bypass, Angioplasty Right Femoral Peroneal bypass, Thrombectomy of Femoral peroneal artery;  Surgeon: Waynetta Sandy, MD;  Location: [facility];  Service: Vascular;  Laterality: Right;  . INTRAOPERATIVE ARTERIOGRAM Right 03/26/2018   Procedure: Intra Operative Arteriogram;  Surgeon: Waynetta Sandy, MD;  Location: [facility];  Service: Vascular;  Laterality: Right;  . LOWER EXTREMITY ANGIOGRAPHY Bilateral 03/25/2018   Procedure: LOWER EXTREMITY ANGIOGRAPHY;  Surgeon: Waynetta Sandy, MD;  Location: [facility];  Service: Cardiovascular;  Laterality: Bilateral;  . LUMBAR LAMINECTOMY/DECOMPRESSION MICRODISCECTOMY N/A 09/09/2014   Procedure: L4-L5 DECOMPRESSION ;  Surgeon: Melina Schools, MD;  Location: [facility];  Service: Orthopedics;  Laterality: N/A;  . PATCH ANGIOPLASTY Right 03/26/2018   Procedure: PATCH ANGIOPLASTY;  Surgeon: Waynetta Sandy, MD;  Location: [facility];  Service: Vascular;  Laterality: Right;  . SHOULDER SURGERY    . THYROID SURGERY    . TONSILLECTOMY    . TOTAL THYROIDECTOMY    . VEIN HARVEST Right 03/26/2018   Procedure: Vein Harvest of Right Leg Greater Saphenous Vein in SITU;  Surgeon: Waynetta Sandy, MD;  Location: [facility];  Service: Vascular;  Laterality: Right;    There were no vitals filed for this visit.  Subjective Assessment - 11/20/18 1704    Subjective  She did not get a chance  to put [facility] over toilet but plans to try this weekend if her daughter comes to her home.    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  No/denies                       [facility] Adult PT Treatment/Exercise - 11/20/18 1706      Transfers   Transfers  Sit to Stand;Stand to Sit    Sit to Stand  5:  Supervision;With upper extremity assist;With armrests;From chair/3-in-1   supervision w/ increased time chairs without armrests, to RW   Sit to Stand Details  Verbal cues for technique;Verbal cues for safe use of DME/AE    Stand to Sit  5: Supervision;With upper extremity assist;With armrests;To chair/3-in-1   to chairs with & without armrests   Stand to Sit Details (indicate cue type and reason)  Verbal cues for safe use of DME/AE;Verbal cues for technique      Ambulation/Gait   Ambulation/Gait  Yes    Ambulation/Gait Assistance  5: Supervision    Ambulation/Gait Assistance Details  worked on ambulating around furniture including tight areas with verbal cues on managing RW.  Verbal cues on upright posture, position within RW, initial contact with heel as soon as shank swings forward and wt shift over prosthesis in stance    Ambulation Distance (Feet)  90 Feet    Assistive device  Rolling walker;Prosthesis    Gait Pattern  Step-to pattern;Decreased step length - left;Decreased stance time - right;Decreased hip/knee flexion - right;Decreased weight shift to right;Right steppage;Antalgic;Lateral hip instability;Trunk flexed    Ambulation Surface  Indoor;Level    Ramp  4: Min assist   RW & prosthesis   Ramp Details (indicate cue type and reason)  manual & tactile cues on technique of upright postur    Curb Details (indicate cue type and reason)  attempted curb but unable to bear wt thru prosthesis due to pt forgot socks, so limb seated too deep in socket and medial wall pushing into groin.       Therapeutic Activites    Other Therapeutic Activities  standing with intermittent UE support to manage clothes. Able to stand up to 13 seconds without UE support while adjusting waist band of pants.  Reaches to ankle level with RW support with supervision.  Verbal cues on placing prosthesis in position of stability & posture.       Prosthetics   Prosthetic Care Comments   Patient forgot prosthetic sock.   PT verbal cues on how this results in residual limb seated too deep in socket and medial wall will push into groin. Pt verbalized understanding with plans to layout prosthesis, liner & socks prior to PT. Increase wear to 2x/day to increase total wear.  Try BSC over guest bathroom toilet.     Current prosthetic wear tolerance (days/week)   6 days/wk.     Current prosthetic wear tolerance (#hours/day)   4-5hrs but only 1x/day    Residual limb condition   no issues per pt.    Education Provided  Skin check;Residual limb care;Correct ply sock adjustment;Proper Donning;Proper wear schedule/adjustment    Person(s) Educated  Patient    Education Method  Explanation;Verbal cues    Education Method  Verbalized understanding;Needs further instruction  Donning Prosthesis  Supervision    Doffing Prosthesis  Modified independent (device/increased time)               PT Short Term Goals - 11/13/18 1742      PT SHORT TERM GOAL #1   Title  Patient transfers stand pivot with RW between chair with armrests & chair without armrest using UEs with supervision. (ALL STGs updated for target date of 12/11/18)    Baseline  Pt needed assist with stand pivot today w/c to bsc min assist. Difficulty weight shifting over prosthesis but was not using walker as trying to simulate home set up.    Time  4    Period  Weeks    Status  On-going    Target Date  12/11/18      PT SHORT TERM GOAL #2   Title  Patient tolerates wearing prosthesis >/= 5 day/wk  >3hrs/day & liner >8hrs total /day without skin issues.    Baseline  wearing 4 hours a day, 4 days a week.    Time  4    Period  Weeks    Status  Not Met    Target Date  12/11/18      PT SHORT TERM GOAL #3   Title  Patient demonstrates ability to toilet with prosthesis including managing pants & sit/stand with supervision.    Baseline  Pt currently not able to perform toilet transfers at home with prosthesis due to bathroom set up. Trying to see if she can use  guest bathroom. Was CGA with pulling pants up and down at bsc today.    Time  4    Period  Weeks    Status  Not Met    Target Date  12/11/18      PT SHORT TERM GOAL #4   Title  Patient ambulates 100' with RW & prosthesis with supervision.    Baseline  not assessed at visit today    Time  4    Period  Weeks    Status  On-going    Target Date  12/11/18      PT SHORT TERM GOAL #5   Title  Standing balance with RW support reaches 5" anteriorly & pick up item from floor with supervision.    Baseline  not assessed today    Time  4    Period  Weeks    Status  On-going    Target Date  12/11/18      PT SHORT TERM GOAL #6   Title  Patient negotiates ramps & curbs with RW & prosthesis with minimal assist.    Baseline  not assessed today    Time  4    Period  Weeks    Status  On-going    Target Date  12/11/18        PT Long Term Goals - 10/08/18 0841      PT LONG TERM GOAL #1   Title  Patient demonstrates & verbalizes proper prosthetic care to enable safe use of prosthesis.  (All LTGs Target Date: 01/03/2019)    Time  12    Period  Weeks    Status  On-going    Target Date  01/03/19      PT LONG TERM GOAL #2   Title  Patient tolerates prosthesis wear >80% of awake hours to improve ability to function during her day.    Time  12    Period  Weeks    Status  On-going    Target Date  01/03/19      PT LONG TERM GOAL #3   Title  Patient standing balance with RW support & prosthesis: static no UE support 1 minute, dynamic RW support scans, reaches 7" anteriorly & manages clothes modified independent.    Time  12    Period  Weeks    Status  On-going    Target Date  01/03/19      PT LONG TERM GOAL #4   Title  Patient ambulates 200' with RW & prosthesis modifiied independent.    Time  12    Period  Weeks    Status  On-going    Target Date  01/03/19      PT LONG TERM GOAL #5   Title  Patient negotiates ramps & curbs with RW & prosthesis modified independent.    Time  12     Period  Weeks    Status  On-going    Target Date  01/03/19            Plan - 11/21/18 1218    Clinical Impression Statement  Patient continues to be hestitant to wear prosthesis while toileting which limits her wear time & thereby functional use.  She improved prosthetic gait with RW negotiating furniture.    Personal Factors and Comorbidities  Age;Comorbidity 3+;Fitness;Past/Current Experience;Time since onset of injury/illness/exacerbation;Other;Transportation   lives alone so limited ability to practice tasks that require assist or supervision   Comorbidities  R TFA, L4-5 decompression sg 2016, DM, HTN,    Examination-Activity Limitations  Bend;Caring for Others;Carry;Locomotion Level;Reach Overhead;Stairs;Stand;Toileting;Transfers    Examination-Participation Restrictions  Community Activity;Church;Meal Prep    Stability/Clinical Decision Making  Evolving/Moderate complexity    Rehab Potential  Good    PT Frequency  2x / week    PT Duration  12 weeks    PT Treatment/Interventions  ADLs/Self Care Home Management;DME Instruction;Gait training;Stair training;Functional mobility training;Therapeutic activities;Therapeutic exercise;Balance training;Neuromuscular re-education;Patient/family education;Prosthetic Training;Vestibular;Passive range of motion;Manual techniques    PT Next Visit Plan  ask if she tried [facility] in her guest bathroom, standing balance & gait towards STGs    PT Home Exercise Plan  Medbridge Access Code: [email or web address]    Consulted and Agree with Plan of Care  Patient       Patient will benefit from skilled therapeutic intervention in order to improve the following deficits and impairments:  Abnormal gait, Decreased activity tolerance, Decreased balance, Decreased endurance, Decreased knowledge of use of DME, Decreased mobility, Decreased range of motion, Decreased strength, Dizziness, Increased edema, Impaired flexibility, Postural dysfunction, Prosthetic Dependency, Pain,  Obesity  Visit Diagnosis: Other abnormalities of gait and mobility  Unsteadiness on feet  Abnormal posture  Muscle weakness     Problem List Patient Active Problem List   Diagnosis Date Noted  . PAD (peripheral artery disease) ([place]) 03/23/2018  . Back pain 09/09/2014  . HYPERLIPIDEMIA 12/18/2006  . RECTAL POLYPS 12/18/2006  . 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 SBO 08/01/2006  . GOITER NOS 03/11/2006  . PERIPHERAL NEUROPATHY 03/11/2006  . HYPERTENSION 03/11/2006  . OSTEOPENIA 03/11/2006  . FOOT DROP 03/11/2006    Jamey Reas PT, DPT 11/21/2018, 12:21 PM  [facility] [address] [place], [place], [address] Phone: [phone]   Fax:  [phone]  Name: Enes Wegener MRN: [id number] Date of Birth: April 17, 1945

## 2018-11-23 DIAGNOSIS — E119 Type 2 diabetes mellitus without complications: Secondary | ICD-10-CM | POA: Diagnosis not present

## 2018-11-23 DIAGNOSIS — Z89611 Acquired absence of right leg above knee: Secondary | ICD-10-CM | POA: Diagnosis not present

## 2018-11-23 DIAGNOSIS — I739 Peripheral vascular disease, unspecified: Secondary | ICD-10-CM | POA: Diagnosis not present

## 2018-11-25 ENCOUNTER — Encounter: Payer: Self-pay | Admitting: Physical Therapy

## 2018-11-25 ENCOUNTER — Other Ambulatory Visit: Payer: Self-pay

## 2018-11-25 ENCOUNTER — Ambulatory Visit: Payer: Medicare HMO | Admitting: Physical Therapy

## 2018-11-25 DIAGNOSIS — I10 Essential (primary) hypertension: Secondary | ICD-10-CM | POA: Diagnosis not present

## 2018-11-25 DIAGNOSIS — E1142 Type 2 diabetes mellitus with diabetic polyneuropathy: Secondary | ICD-10-CM | POA: Diagnosis not present

## 2018-11-25 DIAGNOSIS — R2689 Other abnormalities of gait and mobility: Secondary | ICD-10-CM | POA: Diagnosis not present

## 2018-11-25 DIAGNOSIS — R6889 Other general symptoms and signs: Secondary | ICD-10-CM | POA: Diagnosis not present

## 2018-11-25 DIAGNOSIS — E7800 Pure hypercholesterolemia, unspecified: Secondary | ICD-10-CM | POA: Diagnosis not present

## 2018-11-25 DIAGNOSIS — M25651 Stiffness of right hip, not elsewhere classified: Secondary | ICD-10-CM | POA: Diagnosis not present

## 2018-11-25 DIAGNOSIS — R293 Abnormal posture: Secondary | ICD-10-CM | POA: Diagnosis not present

## 2018-11-25 DIAGNOSIS — E1165 Type 2 diabetes mellitus with hyperglycemia: Secondary | ICD-10-CM | POA: Diagnosis not present

## 2018-11-25 DIAGNOSIS — R2681 Unsteadiness on feet: Secondary | ICD-10-CM

## 2018-11-25 DIAGNOSIS — M6281 Muscle weakness (generalized): Secondary | ICD-10-CM | POA: Diagnosis not present

## 2018-11-25 DIAGNOSIS — Z23 Encounter for immunization: Secondary | ICD-10-CM | POA: Diagnosis not present

## 2018-11-25 DIAGNOSIS — E039 Hypothyroidism, unspecified: Secondary | ICD-10-CM | POA: Diagnosis not present

## 2018-11-25 NOTE — Therapy (Signed)
[facility] [address] [place], [place], [address] Phone: [phone]   Fax:  [phone]  Physical Therapy Treatment  Patient Details  Name: Emma [name] MRN: [id number] Date of Birth: 1945/05/10 Referring Provider (PT): Benito Mccreedy, MD   Encounter Date: 11/25/2018  PT End of Session - 11/25/18 1409    Visit Number  23    Number of Visits  38    Date for PT Re-Evaluation  01/03/19    Authorization Type  HUMANA  Medicare - 10th visit PN required    PT Start Time  1320    PT Stop Time  1400    PT Time Calculation (min)  40 min    Equipment Utilized During Treatment  Other (comment)   BSC   Activity Tolerance  Patient tolerated treatment well    Behavior During Therapy  [facility] for tasks assessed/performed       Past Medical History:  Diagnosis Date  . Arthritis    Back and right shoulder  . Constipation   . Diabetes mellitus    Type II  . Diabetes mellitus without complication ([place])   . Diabetic neuropathy ([place])   . History of blood transfusion   . Hypercholesteremia   . Hypertension   . Hypothyroidism   . Pneumonia   . Thyroid disease     Past Surgical History:  Procedure Laterality Date  . ABDOMINAL AORTOGRAM N/A 03/25/2018   Procedure: ABDOMINAL AORTOGRAM;  Surgeon: Waynetta Sandy, MD;  Location: [place] [facility];  Service: Cardiovascular;  Laterality: N/A;  . ABDOMINAL HYSTERECTOMY    . AMPUTATION Right 03/29/2018   Procedure: AMPUTATION ABOVE KNEE;  Surgeon: Serafina Mitchell, MD;  Location: [place];  Service: Vascular;  Laterality: Right;  . APPENDECTOMY    . BACK SURGERY    . BYPASS GRAFT FEMORAL-PERONEAL Right 03/26/2018   Procedure: BYPASS GRAFT Campbell Stall;  Surgeon: Waynetta Sandy, MD;  Location: [place];  Service: Vascular;  Laterality: Right;  . EXPLORATORY LAPAROTOMY     Adhesions removed and appendix  . FEMORAL-POPLITEAL BYPASS GRAFT Right 03/27/2018    Procedure: Right lower extremity angiogram, Right Femoral Peroneal artery bypass, Angioplasty Right Femoral Peroneal bypass, Thrombectomy of Femoral peroneal artery;  Surgeon: Waynetta Sandy, MD;  Location: [place];  Service: Vascular;  Laterality: Right;  . INTRAOPERATIVE ARTERIOGRAM Right 03/26/2018   Procedure: Intra Operative Arteriogram;  Surgeon: Waynetta Sandy, MD;  Location: [place];  Service: Vascular;  Laterality: Right;  . LOWER EXTREMITY ANGIOGRAPHY Bilateral 03/25/2018   Procedure: LOWER EXTREMITY ANGIOGRAPHY;  Surgeon: Waynetta Sandy, MD;  Location: [place] [facility];  Service: Cardiovascular;  Laterality: Bilateral;  . LUMBAR LAMINECTOMY/DECOMPRESSION MICRODISCECTOMY N/A 09/09/2014   Procedure: L4-L5 DECOMPRESSION ;  Surgeon: Melina Schools, MD;  Location: [place];  Service: Orthopedics;  Laterality: N/A;  . PATCH ANGIOPLASTY Right 03/26/2018   Procedure: PATCH ANGIOPLASTY;  Surgeon: Waynetta Sandy, MD;  Location: [place];  Service: Vascular;  Laterality: Right;  . SHOULDER SURGERY    . THYROID SURGERY    . TONSILLECTOMY    . TOTAL THYROIDECTOMY    . VEIN HARVEST Right 03/26/2018   Procedure: Vein Harvest of Right Leg Greater Saphenous Vein in SITU;  Surgeon: Waynetta Sandy, MD;  Location: [place];  Service: Vascular;  Laterality: Right;    There were no vitals filed for this visit.  Subjective Assessment - 11/25/18 1320    Subjective  She fell in bathroom Saturday  night. She did not have prosthesis on limb. She is very sore all over body. She has not worn prosthesis since fall.    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  Yes    Pain Score  9     Pain Location  Other (Comment)   left shoulder, head, right residual limb   Pain Descriptors / Indicators  Sore    Pain  Type  Acute pain    Pain Onset  In the past 7 days    Pain Frequency  Constant    Aggravating Factors   fall ~40 hrs ago    Pain Relieving Factors  NSAIDs,                       [facility] Adult PT Treatment/Exercise - 11/25/18 1320      Transfers   Transfers  Sit to Stand;Stand to Sit    Sit to Stand  5: Supervision;With upper extremity assist;With armrests;From chair/3-in-1   supervision w/ increased time chairs without armrests, to RW   Sit to Stand Details  Verbal cues for technique;Verbal cues for safe use of DME/AE    Stand to Sit  5: Supervision;With upper extremity assist;With armrests;To chair/3-in-1   to chairs with & without armrests   Stand to Sit Details (indicate cue type and reason)  Verbal cues for safe use of DME/AE;Verbal cues for technique      Ambulation/Gait   Ambulation/Gait  Yes    Ambulation/Gait Assistance  5: Supervision    Ambulation/Gait Assistance Details  tactile & verbal cues on upright posture & wt shift over prosthesis in stance    Ambulation Distance (Feet)  60 Feet   limited by pain today   Assistive device  Rolling walker;Prosthesis    Gait Pattern  Step-to pattern;Decreased step length - left;Decreased stance time - right;Decreased hip/knee flexion - right;Decreased weight shift to right;Right steppage;Antalgic;Lateral hip instability;Trunk flexed    Ambulation Surface  Indoor;Level    Ramp  --      Therapeutic Activites    Other Therapeutic Activities  --      Prosthetics   Prosthetic Care Comments   use of baby oil along medial bypass graft to reduce friction from liner to help tenderness.  PT recommended appt with prosthetist to add pads to socket, check alignment & discuss covering prosthesis.     Current prosthetic wear tolerance (days/week)   daily for 5 days then no wear 2 days since fall    Current prosthetic wear tolerance (#hours/day)   4-5hrs but only 1x/day    Residual limb condition   no new color changes or bruising  noted, no new edema, increased tenderness distal femur & medial limb,      Education Provided  Skin check;Residual limb care;Correct ply sock adjustment;Proper Donning;Proper wear schedule/adjustment    Person(s) Educated  Patient    Education Method  Explanation;Verbal cues    Education Method  Verbalized understanding;Needs further instruction    Donning Prosthesis  Supervision    Doffing Prosthesis  Modified independent (device/increased time)             PT Education - 11/25/18 1355    Education Details  discussed concerns with lock bar on door that required her to crawl to door  after her fall    Person(s) Educated  Patient    Methods  Explanation;Verbal cues    Comprehension  Verbalized understanding       PT Short Term Goals - 11/13/18 1742      PT SHORT TERM GOAL #1   Title  Patient transfers stand pivot with RW between chair with armrests & chair without armrest using UEs with supervision. (ALL STGs updated for target date of 12/11/18)    Baseline  Pt needed assist with stand pivot today w/c to bsc min assist. Difficulty weight shifting over prosthesis but was not using walker as trying to simulate home set up.    Time  4    Period  Weeks    Status  On-going    Target Date  12/11/18      PT SHORT TERM GOAL #2   Title  Patient tolerates wearing prosthesis >/= 5 day/wk  >3hrs/day & liner >8hrs total /day without skin issues.    Baseline  wearing 4 hours a day, 4 days a week.    Time  4    Period  Weeks    Status  Not Met    Target Date  12/11/18      PT SHORT TERM GOAL #3   Title  Patient demonstrates ability to toilet with prosthesis including managing pants & sit/stand with supervision.    Baseline  Pt currently not able to perform toilet transfers at home with prosthesis due to bathroom set up. Trying to see if she can use guest bathroom. Was CGA with pulling pants up and down at bsc today.    Time  4    Period  Weeks    Status  Not Met    Target Date   12/11/18      PT SHORT TERM GOAL #4   Title  Patient ambulates 100' with RW & prosthesis with supervision.    Baseline  not assessed at visit today    Time  4    Period  Weeks    Status  On-going    Target Date  12/11/18      PT SHORT TERM GOAL #5   Title  Standing balance with RW support reaches 5" anteriorly & pick up item from floor with supervision.    Baseline  not assessed today    Time  4    Period  Weeks    Status  On-going    Target Date  12/11/18      PT SHORT TERM GOAL #6   Title  Patient negotiates ramps & curbs with RW & prosthesis with minimal assist.    Baseline  not assessed today    Time  4    Period  Weeks    Status  On-going    Target Date  12/11/18        PT Long Term Goals - 10/08/18 0841      PT LONG TERM GOAL #1   Title  Patient demonstrates & verbalizes proper prosthetic care to enable safe use of prosthesis.  (All LTGs Target Date: 01/03/2019)    Time  12    Period  Weeks    Status  On-going    Target Date  01/03/19      PT LONG TERM GOAL #2   Title  Patient tolerates prosthesis wear >80% of awake hours to improve ability to function during her day.    Time  12    Period  Weeks  Status  On-going    Target Date  01/03/19      PT LONG TERM GOAL #3   Title  Patient standing balance with RW support & prosthesis: static no UE support 1 minute, dynamic RW support scans, reaches 7" anteriorly & manages clothes modified independent.    Time  12    Period  Weeks    Status  On-going    Target Date  01/03/19      PT LONG TERM GOAL #4   Title  Patient ambulates 200' with RW & prosthesis modifiied independent.    Time  12    Period  Weeks    Status  On-going    Target Date  01/03/19      PT LONG TERM GOAL #5   Title  Patient negotiates ramps & curbs with RW & prosthesis modified independent.    Time  12    Period  Weeks    Status  On-going    Target Date  01/03/19            Plan - 11/25/18 2211    Clinical Impression Statement   Patient fell 2 days ago in bathroom. She was not wearing her prosthesis. She bruised her residual limb that limits weight bearing tolerance today.  Patient does not appear to have any significant injuries from her fall.    Personal Factors and Comorbidities  Age;Comorbidity 3+;Fitness;Past/Current Experience;Time since onset of injury/illness/exacerbation;Other;Transportation   lives alone so limited ability to practice tasks that require assist or supervision   Comorbidities  R TFA, L4-5 decompression sg 2016, DM, HTN,    Examination-Activity Limitations  Bend;Caring for Others;Carry;Locomotion Level;Reach Overhead;Stairs;Stand;Toileting;Transfers    Examination-Participation Restrictions  Community Activity;Church;Meal Prep    Stability/Clinical Decision Making  Evolving/Moderate complexity    Rehab Potential  Good    PT Frequency  2x / week    PT Duration  12 weeks    PT Treatment/Interventions  ADLs/Self Care Home Management;DME Instruction;Gait training;Stair training;Functional mobility training;Therapeutic activities;Therapeutic exercise;Balance training;Neuromuscular re-education;Patient/family education;Prosthetic Training;Vestibular;Passive range of motion;Manual techniques    PT Next Visit Plan  check recovery from fall on 11/7, ask if she tried [facility] in her guest bathroom, standing balance & gait towards STGs    PT Home Exercise Plan  Medbridge Access Code: [email or web address]    Consulted and Agree with Plan of Care  Patient       Patient will benefit from skilled therapeutic intervention in order to improve the following deficits and impairments:  Abnormal gait, Decreased activity tolerance, Decreased balance, Decreased endurance, Decreased knowledge of use of DME, Decreased mobility, Decreased range of motion, Decreased strength, Dizziness, Increased edema, Impaired flexibility, Postural dysfunction, Prosthetic Dependency, Pain, Obesity  Visit Diagnosis: Other abnormalities of gait and  mobility  Unsteadiness on feet  Abnormal posture  Muscle weakness     Problem List Patient Active Problem List   Diagnosis Date Noted  . PAD (peripheral artery disease) ([place]) 03/23/2018  . Back pain 09/09/2014  . HYPERLIPIDEMIA 12/18/2006  . RECTAL POLYPS 12/18/2006  . 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 SBO 08/01/2006  . GOITER NOS 03/11/2006  . PERIPHERAL NEUROPATHY 03/11/2006  . HYPERTENSION 03/11/2006  . OSTEOPENIA 03/11/2006  . FOOT DROP 03/11/2006    Jamey Reas PT, DPT 11/25/2018, 10:16 PM  [facility] [address] [place], [place], [address] Phone: [phone]   Fax:  [phone]  Name: Eliany Mccarter MRN: [id number] Date of Birth: Jan 27, 1945

## 2018-11-27 ENCOUNTER — Ambulatory Visit: Payer: Medicare HMO | Admitting: Physical Therapy

## 2018-11-27 ENCOUNTER — Encounter: Payer: Self-pay | Admitting: Physical Therapy

## 2018-11-27 ENCOUNTER — Other Ambulatory Visit: Payer: Self-pay

## 2018-11-27 ENCOUNTER — Encounter: Payer: Medicare HMO | Admitting: Physical Therapy

## 2018-11-27 DIAGNOSIS — M6281 Muscle weakness (generalized): Secondary | ICD-10-CM | POA: Diagnosis not present

## 2018-11-27 DIAGNOSIS — R2689 Other abnormalities of gait and mobility: Secondary | ICD-10-CM | POA: Diagnosis not present

## 2018-11-27 DIAGNOSIS — R2681 Unsteadiness on feet: Secondary | ICD-10-CM

## 2018-11-27 DIAGNOSIS — R293 Abnormal posture: Secondary | ICD-10-CM | POA: Diagnosis not present

## 2018-11-27 DIAGNOSIS — M25651 Stiffness of right hip, not elsewhere classified: Secondary | ICD-10-CM | POA: Diagnosis not present

## 2018-11-28 NOTE — Therapy (Signed)
[facility] [address] [place], [place], [address] Phone: [phone]   Fax:  [phone]  Physical Therapy Treatment  Patient Details  Name: Emma [name] MRN: [id number] Date of Birth: Jul 24, 1945 Referring Provider (PT): Benito Mccreedy, MD   Encounter Date: 11/27/2018  PT End of Session - 11/27/18 1848    Visit Number  24    Number of Visits  38    Date for PT Re-Evaluation  01/03/19    Authorization Type  HUMANA  Medicare - 10th visit PN required    PT Start Time  1700    PT Stop Time  1745    PT Time Calculation (min)  45 min    Equipment Utilized During Treatment  Other (comment)   BSC   Activity Tolerance  Patient tolerated treatment well    Behavior During Therapy  [facility] for tasks assessed/performed       Past Medical History:  Diagnosis Date  . Arthritis    Back and right shoulder  . Constipation   . Diabetes mellitus    Type II  . Diabetes mellitus without complication ([place])   . Diabetic neuropathy ([place])   . History of blood transfusion   . Hypercholesteremia   . Hypertension   . Hypothyroidism   . Pneumonia   . Thyroid disease     Past Surgical History:  Procedure Laterality Date  . ABDOMINAL AORTOGRAM N/A 03/25/2018   Procedure: ABDOMINAL AORTOGRAM;  Surgeon: Waynetta Sandy, MD;  Location: [place] [facility];  Service: Cardiovascular;  Laterality: N/A;  . ABDOMINAL HYSTERECTOMY    . AMPUTATION Right 03/29/2018   Procedure: AMPUTATION ABOVE KNEE;  Surgeon: Serafina Mitchell, MD;  Location: [place];  Service: Vascular;  Laterality: Right;  . APPENDECTOMY    . BACK SURGERY    . BYPASS GRAFT FEMORAL-PERONEAL Right 03/26/2018   Procedure: BYPASS GRAFT Campbell Stall;  Surgeon: Waynetta Sandy, MD;  Location: [place];  Service: Vascular;  Laterality: Right;  . EXPLORATORY LAPAROTOMY     Adhesions removed and appendix  . FEMORAL-POPLITEAL BYPASS GRAFT Right 03/27/2018    Procedure: Right lower extremity angiogram, Right Femoral Peroneal artery bypass, Angioplasty Right Femoral Peroneal bypass, Thrombectomy of Femoral peroneal artery;  Surgeon: Waynetta Sandy, MD;  Location: [place];  Service: Vascular;  Laterality: Right;  . INTRAOPERATIVE ARTERIOGRAM Right 03/26/2018   Procedure: Intra Operative Arteriogram;  Surgeon: Waynetta Sandy, MD;  Location: [place];  Service: Vascular;  Laterality: Right;  . LOWER EXTREMITY ANGIOGRAPHY Bilateral 03/25/2018   Procedure: LOWER EXTREMITY ANGIOGRAPHY;  Surgeon: Waynetta Sandy, MD;  Location: [place] [facility];  Service: Cardiovascular;  Laterality: Bilateral;  . LUMBAR LAMINECTOMY/DECOMPRESSION MICRODISCECTOMY N/A 09/09/2014   Procedure: L4-L5 DECOMPRESSION ;  Surgeon: Melina Schools, MD;  Location: [place];  Service: Orthopedics;  Laterality: N/A;  . PATCH ANGIOPLASTY Right 03/26/2018   Procedure: PATCH ANGIOPLASTY;  Surgeon: Waynetta Sandy, MD;  Location: [place];  Service: Vascular;  Laterality: Right;  . SHOULDER SURGERY    . THYROID SURGERY    . TONSILLECTOMY    . TOTAL THYROIDECTOMY    . VEIN HARVEST Right 03/26/2018   Procedure: Vein Harvest of Right Leg Greater Saphenous Vein in SITU;  Surgeon: Waynetta Sandy, MD;  Location: [place];  Service: Vascular;  Laterality: Right;    There were no vitals filed for this visit.  Subjective Assessment - 11/27/18 1700    Subjective  Soreness is better. She has  not worn prosthesis since last PT session.    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  Yes    Pain Score  5     Pain Location  Other (Comment)   all over & residual limb   Pain Descriptors / Indicators  Sore    Pain Onset  In the past 7 days    Aggravating Factors   from fall 5 days ago                        [facility] Adult PT Treatment/Exercise - 11/27/18 1700      Transfers   Transfers  Sit to Stand;Stand to Sit    Sit to Stand  5: Supervision;With upper extremity assist;With armrests;From chair/3-in-1   supervision w/ increased time chairs without armrests, to RW   Sit to Stand Details  Verbal cues for technique;Verbal cues for safe use of DME/AE    Stand to Sit  5: Supervision;With upper extremity assist;With armrests;To chair/3-in-1   to chairs with & without armrests   Stand to Sit Details (indicate cue type and reason)  Verbal cues for safe use of DME/AE;Verbal cues for technique      Ambulation/Gait   Ambulation/Gait  Yes    Ambulation/Gait Assistance  5: Supervision    Ambulation/Gait Assistance Details  demo & verbal cues on sequencing, continuation of movement, upright posture/RW position and wt shift over prosthesis in stance    Ambulation Distance (Feet)  90 Feet    Assistive device  Rolling walker;Prosthesis    Gait Pattern  Step-to pattern;Decreased step length - left;Decreased stance time - right;Decreased hip/knee flexion - right;Decreased weight shift to right;Right steppage;Antalgic;Lateral hip instability;Trunk flexed    Ambulation Surface  Indoor;Level    Ramp  4: Min assist   RW & TFA prosthesis    Ramp Details (indicate cue type and reason)  upright posture & wt shift over prosthesis in stance    Curb  3: Mod assist   RW & TFA prosthesis    Curb Details (indicate cue type and reason)  sequence and prosthetic knee control      Prosthetics   Prosthetic Care Comments   discussed slow progress which is significantly impacted by limited practice outside of PT with limited wear.      Current prosthetic wear tolerance (days/week)   Since fall 5 days ago only worn at 2 PT sessions    Current prosthetic wear tolerance (#hours/day)   30-40 minutes during PT, was 4-5hrs but only 1x/day    Residual limb condition   no new color changes or bruising  noted, no new edema, increased tenderness distal femur & medial limb,      Education Provided  Skin check;Residual limb care;Correct ply sock adjustment;Proper Donning;Proper wear schedule/adjustment    Person(s) Educated  Patient    Education Method  Explanation;Verbal cues    Education Method  Verbalized understanding;Needs further instruction    Donning Prosthesis  Supervision    Doffing Prosthesis  Modified independent (device/increased time)               PT Short Term Goals - 11/13/18 1742      PT SHORT TERM GOAL #1   Title  Patient transfers stand pivot with RW between chair  with armrests & chair without armrest using UEs with supervision. (ALL STGs updated for target date of 12/11/18)    Baseline  Pt needed assist with stand pivot today w/c to bsc min assist. Difficulty weight shifting over prosthesis but was not using walker as trying to simulate home set up.    Time  4    Period  Weeks    Status  On-going    Target Date  12/11/18      PT SHORT TERM GOAL #2   Title  Patient tolerates wearing prosthesis >/= 5 day/wk  >3hrs/day & liner >8hrs total /day without skin issues.    Baseline  wearing 4 hours a day, 4 days a week.    Time  4    Period  Weeks    Status  Not Met    Target Date  12/11/18      PT SHORT TERM GOAL #3   Title  Patient demonstrates ability to toilet with prosthesis including managing pants & sit/stand with supervision.    Baseline  Pt currently not able to perform toilet transfers at home with prosthesis due to bathroom set up. Trying to see if she can use guest bathroom. Was CGA with pulling pants up and down at bsc today.    Time  4    Period  Weeks    Status  Not Met    Target Date  12/11/18      PT SHORT TERM GOAL #4   Title  Patient ambulates 100' with RW & prosthesis with supervision.    Baseline  not assessed at visit today    Time  4    Period  Weeks    Status  On-going    Target Date  12/11/18      PT SHORT TERM GOAL #5   Title   Standing balance with RW support reaches 5" anteriorly & pick up item from floor with supervision.    Baseline  not assessed today    Time  4    Period  Weeks    Status  On-going    Target Date  12/11/18      PT SHORT TERM GOAL #6   Title  Patient negotiates ramps & curbs with RW & prosthesis with minimal assist.    Baseline  not assessed today    Time  4    Period  Weeks    Status  On-going    Target Date  12/11/18        PT Long Term Goals - 10/08/18 0841      PT LONG TERM GOAL #1   Title  Patient demonstrates & verbalizes proper prosthetic care to enable safe use of prosthesis.  (All LTGs Target Date: 01/03/2019)    Time  12    Period  Weeks    Status  On-going    Target Date  01/03/19      PT LONG TERM GOAL #2   Title  Patient tolerates prosthesis wear >80% of awake hours to improve ability to function during her day.    Time  12    Period  Weeks    Status  On-going    Target Date  01/03/19      PT LONG TERM GOAL #3   Title  Patient standing balance with RW support & prosthesis: static no UE support 1 minute, dynamic RW support scans, reaches 7" anteriorly & manages clothes modified independent.    Time  12  Period  Weeks    Status  On-going    Target Date  01/03/19      PT LONG TERM GOAL #4   Title  Patient ambulates 200' with RW & prosthesis modifiied independent.    Time  12    Period  Weeks    Status  On-going    Target Date  01/03/19      PT LONG TERM GOAL #5   Title  Patient negotiates ramps & curbs with RW & prosthesis modified independent.    Time  12    Period  Weeks    Status  On-going    Target Date  01/03/19            Plan - 11/28/18 1258    Clinical Impression Statement  Pain from fall on 11/22/2018 is decreasing and only minimally impacting activity level.  Patient is progressing slowly due to limited wear & practice outside of PT.  Patient was able to improve fluency of gait with RW with skilled instruction.    Personal Factors and  Comorbidities  Age;Comorbidity 3+;Fitness;Past/Current Experience;Time since onset of injury/illness/exacerbation;Other;Transportation   lives alone so limited ability to practice tasks that require assist or supervision   Comorbidities  R TFA, L4-5 decompression sg 2016, DM, HTN,    Examination-Activity Limitations  Bend;Caring for Others;Carry;Locomotion Level;Reach Overhead;Stairs;Stand;Toileting;Transfers    Examination-Participation Restrictions  Community Activity;Church;Meal Prep    Stability/Clinical Decision Making  Evolving/Moderate complexity    Rehab Potential  Good    PT Frequency  2x / week    PT Duration  12 weeks    PT Treatment/Interventions  ADLs/Self Care Home Management;DME Instruction;Gait training;Stair training;Functional mobility training;Therapeutic activities;Therapeutic exercise;Balance training;Neuromuscular re-education;Patient/family education;Prosthetic Training;Vestibular;Passive range of motion;Manual techniques    PT Next Visit Plan  ask if she tried [facility] in her guest bathroom, standing balance & gait towards STGs    PT Home Exercise Plan  Medbridge Access Code: [email or web address]    Consulted and Agree with Plan of Care  Patient       Patient will benefit from skilled therapeutic intervention in order to improve the following deficits and impairments:  Abnormal gait, Decreased activity tolerance, Decreased balance, Decreased endurance, Decreased knowledge of use of DME, Decreased mobility, Decreased range of motion, Decreased strength, Dizziness, Increased edema, Impaired flexibility, Postural dysfunction, Prosthetic Dependency, Pain, Obesity  Visit Diagnosis: Unsteadiness on feet  Other abnormalities of gait and mobility  Abnormal posture  Muscle weakness     Problem List Patient Active Problem List   Diagnosis Date Noted  . PAD (peripheral artery disease) ([place]) 03/23/2018  . Back pain 09/09/2014  . HYPERLIPIDEMIA 12/18/2006  . RECTAL POLYPS 12/18/2006  .  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 SBO 08/01/2006  . GOITER NOS 03/11/2006  . PERIPHERAL NEUROPATHY 03/11/2006  . HYPERTENSION 03/11/2006  . OSTEOPENIA 03/11/2006  . FOOT DROP 03/11/2006    Jamey Reas PT, DPT 11/28/2018, 1:01 PM  [facility] [address] [place], [place], [address] Phone: [phone]   Fax:  [phone]  Name: Emma [name] MRN: [id number] Date of Birth: Dec 24, 1945

## 2018-12-02 ENCOUNTER — Ambulatory Visit: Payer: Medicare HMO | Admitting: Physical Therapy

## 2018-12-02 ENCOUNTER — Telehealth: Payer: Self-pay | Admitting: Physical Therapy

## 2018-12-02 NOTE — Telephone Encounter (Signed)
Patient reports she fell out of bed while still asleep this morning. She hit her head on night stand & legs on w/c. She denies any serious injury but is very sore & upset. She cancelled today's PT session. PT reviewed signs of concussion and need to seek medical assistance if any arise.  Patient verbalized understanding.

## 2018-12-04 ENCOUNTER — Encounter: Payer: Self-pay | Admitting: Physical Therapy

## 2018-12-04 ENCOUNTER — Other Ambulatory Visit: Payer: Self-pay

## 2018-12-04 ENCOUNTER — Ambulatory Visit: Payer: Medicare HMO | Admitting: Physical Therapy

## 2018-12-04 DIAGNOSIS — R2689 Other abnormalities of gait and mobility: Secondary | ICD-10-CM | POA: Diagnosis not present

## 2018-12-04 DIAGNOSIS — R2681 Unsteadiness on feet: Secondary | ICD-10-CM

## 2018-12-04 DIAGNOSIS — R293 Abnormal posture: Secondary | ICD-10-CM | POA: Diagnosis not present

## 2018-12-04 DIAGNOSIS — M25651 Stiffness of right hip, not elsewhere classified: Secondary | ICD-10-CM | POA: Diagnosis not present

## 2018-12-04 DIAGNOSIS — M6281 Muscle weakness (generalized): Secondary | ICD-10-CM | POA: Diagnosis not present

## 2018-12-05 ENCOUNTER — Telehealth: Payer: Self-pay | Admitting: Physical Therapy

## 2018-12-05 NOTE — Telephone Encounter (Signed)
Emma [name] has fallen two times in last two weeks. She is struggling with being able to function safely with or without the prosthesis. PT has attempted educating her multiple ways to improve ability to use prosthesis in her home. She has become so fearful that she is unable to attempt.  She appears would benefit from [facility] to improve safety & function within her home. She also would benefit from CNA assistance. Can you please write referral for Home health PT & CNA?  She previously used [facility] and is receptive to using them again.  Jamey Reas, PT, DPT PT Specializing in [place] 12/05/2018@ 7:46 PM Phone:  [phone]  Fax:  [phone] [facility] [address] [place], [place] [address]

## 2018-12-05 NOTE — Therapy (Signed)
[facility] [address] [place], [place], [address] Phone: [phone]   Fax:  [phone]  Physical Therapy Treatment  Patient Details  Name: Emma [name] MRN: [id number] Date of Birth: 1945/09/05 Referring Provider (PT): Benito Mccreedy, MD   Encounter Date: 12/04/2018  PT End of Session - 12/04/18 1857    Visit Number  25    Number of Visits  38    Date for PT Re-Evaluation  01/03/19    Authorization Type  [employer]  Medicare - 10th visit PN required    PT Start Time  1700    PT Stop Time  1745    PT Time Calculation (min)  45 min    Equipment Utilized During Treatment  Other (comment)   BSC   Activity Tolerance  Patient tolerated treatment well    Behavior During Therapy  [facility] for tasks assessed/performed       Past Medical History:  Diagnosis Date  . Arthritis    Back and right shoulder  . Constipation   . Diabetes mellitus    Type II  . Diabetes mellitus without complication ([place])   . Diabetic neuropathy ([place])   . History of blood transfusion   . Hypercholesteremia   . Hypertension   . Hypothyroidism   . Pneumonia   . Thyroid disease     Past Surgical History:  Procedure Laterality Date  . ABDOMINAL AORTOGRAM N/A 03/25/2018   Procedure: ABDOMINAL AORTOGRAM;  Surgeon: Waynetta Sandy, MD;  Location: [facility];  Service: Cardiovascular;  Laterality: N/A;  . ABDOMINAL HYSTERECTOMY    . AMPUTATION Right 03/29/2018   Procedure: AMPUTATION ABOVE KNEE;  Surgeon: Serafina Mitchell, MD;  Location: [facility];  Service: Vascular;  Laterality: Right;  . APPENDECTOMY    . BACK SURGERY    . BYPASS GRAFT FEMORAL-PERONEAL Right 03/26/2018   Procedure: BYPASS GRAFT [place]all;  Surgeon: Waynetta Sandy, MD;  Location: [facility];  Service: Vascular;  Laterality: Right;  . EXPLORATORY LAPAROTOMY     Adhesions removed and appendix  . FEMORAL-POPLITEAL BYPASS GRAFT Right 03/27/2018   Procedure: Right lower extremity angiogram, Right Femoral Peroneal artery bypass, Angioplasty Right Femoral Peroneal bypass, Thrombectomy of Femoral peroneal artery;  Surgeon: Waynetta Sandy, MD;  Location: [facility];  Service: Vascular;  Laterality: Right;  . INTRAOPERATIVE ARTERIOGRAM Right 03/26/2018   Procedure: Intra Operative Arteriogram;  Surgeon: Waynetta Sandy, MD;  Location: [facility];  Service: Vascular;  Laterality: Right;  . LOWER EXTREMITY ANGIOGRAPHY Bilateral 03/25/2018   Procedure: LOWER EXTREMITY ANGIOGRAPHY;  Surgeon: Waynetta Sandy, MD;  Location: [facility];  Service: Cardiovascular;  Laterality: Bilateral;  . LUMBAR LAMINECTOMY/DECOMPRESSION MICRODISCECTOMY N/A 09/09/2014   Procedure: L4-L5 DECOMPRESSION ;  Surgeon: Melina Schools, MD;  Location: [facility];  Service: Orthopedics;  Laterality: N/A;  . PATCH ANGIOPLASTY Right 03/26/2018   Procedure: PATCH ANGIOPLASTY;  Surgeon: Waynetta Sandy, MD;  Location: [facility];  Service: Vascular;  Laterality: Right;  . SHOULDER SURGERY    . THYROID SURGERY    . TONSILLECTOMY    . TOTAL THYROIDECTOMY    . VEIN HARVEST Right 03/26/2018   Procedure: Vein Harvest of Right Leg Greater Saphenous Vein in SITU;  Surgeon: Waynetta Sandy, MD;  Location: [facility];  Service: Vascular;  Laterality: Right;    There were no vitals filed for this visit.  Subjective Assessment - 12/04/18 1700    Subjective  She fell out of bed Monday  morning. She woke up when she hit her head on night stand. She is sore all over. She had to crawl to door as she had door jammer on door and friend could not get into apt.    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  Yes    Pain Score  6     Pain Location  Other (Comment)   all over   Pain Descriptors /  Indicators  Sore;Aching    Pain Type  Acute pain;Other (Comment)   recent fall out of bed   Pain Onset  In the past 7 days    Pain Frequency  Constant    Aggravating Factors   recent fall    Pain Relieving Factors  medication/tylenol,       PT assisted pt with donning prosthesis. Patient wore jeans for first time due to cold weather. The legs of pants were too tight for patient to pull up over the prosthesis without assistance. The waist was >4" too small to connect.   Patient reports fear to perform any activities in home with & without prosthesis. She has had 2 falls (first on 11/23/2018 in bathroom & second on 12/02/2018 out of bed while sleeping) and is now fearful.  She is very sore from falls but denies headaches or visual changes. PT recommended that she be checked after falls. PT made recommendations when patient called to report fall on 12/02/2018 and today during PT session.   Patient reports unable to fold clean clothes that she has located on spare bed.  PT recommended folding at kitchen table while seated in w/c. Patient verbalized understanding and would probably work.   PT recommended considering home health PT in order to work on improving independence in household mobility & tasks with prosthesis. Pt in agreement. She previously used [employer] and is okay with using same company.                         PT Education - 12/04/18 1745    Education Details  Home Health vs Outpatient Rehab, recent falls, need to become functional with prosthesis in her home.    Person(s) Educated  Patient    Methods  Explanation;Verbal cues    Comprehension  Verbalized understanding       PT Short Term Goals - 11/13/18 1742      PT SHORT TERM GOAL #1   Title  Patient transfers stand pivot with RW between chair with armrests & chair without armrest using UEs with supervision. (ALL STGs updated for target date of 12/11/18)    Baseline  Pt needed assist with stand pivot  today w/c to bsc min assist. Difficulty weight shifting over prosthesis but was not using walker as trying to simulate home set up.    Time  4    Period  Weeks    Status  On-going    Target Date  12/11/18      PT SHORT TERM GOAL #2   Title  Patient tolerates wearing prosthesis >/= 5 day/wk  >3hrs/day & liner >8hrs total /day without skin issues.    Baseline  wearing 4 hours a day, 4 days a week.    Time  4    Period  Weeks  Status  Not Met    Target Date  12/11/18      PT SHORT TERM GOAL #3   Title  Patient demonstrates ability to toilet with prosthesis including managing pants & sit/stand with supervision.    Baseline  Pt currently not able to perform toilet transfers at home with prosthesis due to bathroom set up. Trying to see if she can use guest bathroom. Was CGA with pulling pants up and down at bsc today.    Time  4    Period  Weeks    Status  Not Met    Target Date  12/11/18      PT SHORT TERM GOAL #4   Title  Patient ambulates 100' with RW & prosthesis with supervision.    Baseline  not assessed at visit today    Time  4    Period  Weeks    Status  On-going    Target Date  12/11/18      PT SHORT TERM GOAL #5   Title  Standing balance with RW support reaches 5" anteriorly & pick up item from floor with supervision.    Baseline  not assessed today    Time  4    Period  Weeks    Status  On-going    Target Date  12/11/18      PT SHORT TERM GOAL #6   Title  Patient negotiates ramps & curbs with RW & prosthesis with minimal assist.    Baseline  not assessed today    Time  4    Period  Weeks    Status  On-going    Target Date  12/11/18        PT Long Term Goals - 10/08/18 0841      PT LONG TERM GOAL #1   Title  Patient demonstrates & verbalizes proper prosthetic care to enable safe use of prosthesis.  (All LTGs Target Date: 01/03/2019)    Time  12    Period  Weeks    Status  On-going    Target Date  01/03/19      PT LONG TERM GOAL #2   Title  Patient  tolerates prosthesis wear >80% of awake hours to improve ability to function during her day.    Time  12    Period  Weeks    Status  On-going    Target Date  01/03/19      PT LONG TERM GOAL #3   Title  Patient standing balance with RW support & prosthesis: static no UE support 1 minute, dynamic RW support scans, reaches 7" anteriorly & manages clothes modified independent.    Time  12    Period  Weeks    Status  On-going    Target Date  01/03/19      PT LONG TERM GOAL #4   Title  Patient ambulates 200' with RW & prosthesis modifiied independent.    Time  12    Period  Weeks    Status  On-going    Target Date  01/03/19      PT LONG TERM GOAL #5   Title  Patient negotiates ramps & curbs with RW & prosthesis modified independent.    Time  12    Period  Weeks    Status  On-going    Target Date  01/03/19            Plan - 12/04/18 1907    Clinical Impression Statement  Patient was sore from recent fall which is second in 2 weeks.  She is scared and fearful limiting mobility. She has not worn prosthesis since first fall on 11/25/2018. Patient would benefit Home Health PT to improve safety and mobility in her home.  Patient verbalizes understanding of PT recommendation to ask MD to order home health. She also would benefit from CNA help. PT has educated patient in ability to incorporate prosthesis into household tasks but she is too fearful to attempt at home. She lives alone and does not have assistance to attempt carrying out tasks.  Patient is in agreement with Home Health referral recommendation.    Personal Factors and Comorbidities  Age;Comorbidity 3+;Fitness;Past/Current Experience;Time since onset of injury/illness/exacerbation;Other;Transportation   lives alone so limited ability to practice tasks that require assist or supervision   Comorbidities  R TFA, L4-5 decompression sg 2016, DM, HTN,    Examination-Activity Limitations  Bend;Caring for Others;Carry;Locomotion  Level;Reach Overhead;Stairs;Stand;Toileting;Transfers    Examination-Participation Restrictions  Community Activity;Church;Meal Prep    Stability/Clinical Decision Making  Evolving/Moderate complexity    Rehab Potential  Good    PT Frequency  2x / week    PT Duration  12 weeks    PT Treatment/Interventions  ADLs/Self Care Home Management;DME Instruction;Gait training;Stair training;Functional mobility training;Therapeutic activities;Therapeutic exercise;Balance training;Neuromuscular re-education;Patient/family education;Prosthetic Training;Vestibular;Passive range of motion;Manual techniques    PT Next Visit Plan  send request to MD for [facility] Access Code: [email or web address]    Consulted and Agree with Plan of Care  Patient       Patient will benefit from skilled therapeutic intervention in order to improve the following deficits and impairments:  Abnormal gait, Decreased activity tolerance, Decreased balance, Decreased endurance, Decreased knowledge of use of DME, Decreased mobility, Decreased range of motion, Decreased strength, Dizziness, Increased edema, Impaired flexibility, Postural dysfunction, Prosthetic Dependency, Pain, Obesity  Visit Diagnosis: Other abnormalities of gait and mobility  Unsteadiness on feet  Abnormal posture  Muscle weakness     Problem List Patient Active Problem List   Diagnosis Date Noted  . PAD (peripheral artery disease) ([place]) 03/23/2018  . Back pain 09/09/2014  . HYPERLIPIDEMIA 12/18/2006  . RECTAL POLYPS 12/18/2006  . 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 SBO 08/01/2006  . GOITER NOS 03/11/2006  . PERIPHERAL NEUROPATHY 03/11/2006  . HYPERTENSION 03/11/2006  . OSTEOPENIA 03/11/2006  . FOOT DROP 03/11/2006    Jamey Reas  PT, DPT 12/05/2018, 7:23 PM  [facility] [address] [place], [place], [address] Phone: [phone]   Fax:  [phone]  Name: Emma [name] MRN: [id number] Date of Birth: [birth year]

## 2018-12-09 ENCOUNTER — Other Ambulatory Visit: Payer: Self-pay

## 2018-12-09 ENCOUNTER — Encounter: Payer: Self-pay | Admitting: Physical Therapy

## 2018-12-09 ENCOUNTER — Ambulatory Visit: Payer: Medicare HMO | Admitting: Physical Therapy

## 2018-12-09 DIAGNOSIS — R2689 Other abnormalities of gait and mobility: Secondary | ICD-10-CM

## 2018-12-09 DIAGNOSIS — R293 Abnormal posture: Secondary | ICD-10-CM | POA: Diagnosis not present

## 2018-12-09 DIAGNOSIS — M6281 Muscle weakness (generalized): Secondary | ICD-10-CM

## 2018-12-09 DIAGNOSIS — R2681 Unsteadiness on feet: Secondary | ICD-10-CM

## 2018-12-09 DIAGNOSIS — M25651 Stiffness of right hip, not elsewhere classified: Secondary | ICD-10-CM | POA: Diagnosis not present

## 2018-12-10 ENCOUNTER — Encounter: Payer: Medicare HMO | Admitting: Physical Therapy

## 2018-12-10 NOTE — Therapy (Signed)
[facility] [address] [place], [place], [address] Phone: [phone]   Fax:  [phone]  Physical Therapy Treatment  Patient Details  Name: Emma [name] MRN: [id number] Date of Birth: Dec 01, 1945 Referring Provider ([name]): Emma Mccreedy, MD   Encounter Date: 12/09/2018  [name] End of Session - 12/09/18 1522    Visit Number  26    Number of Visits  38    Date for [name] Re-Evaluation  01/03/19    Authorization Type  HUMANA  Medicare - 10th visit PN required    [name] Start Time  1400    [name] Stop Time  1440    [name] Time Calculation (min)  40 min    Equipment Utilized During Treatment  Other (comment)   BSC   Activity Tolerance  Patient tolerated treatment well    Behavior During Therapy  [facility] for tasks assessed/performed       Past Medical History:  Diagnosis Date  . Arthritis    Back and right shoulder  . Constipation   . Diabetes mellitus    Type II  . Diabetes mellitus without complication ([place])   . Diabetic neuropathy ([place])   . History of blood transfusion   . Hypercholesteremia   . Hypertension   . Hypothyroidism   . Pneumonia   . Thyroid disease     Past Surgical History:  Procedure Laterality Date  . ABDOMINAL AORTOGRAM N/A 03/25/2018   Procedure: ABDOMINAL AORTOGRAM;  Surgeon: Emma Sandy, MD;  Location: [facility];  Service: Cardiovascular;  Laterality: N/A;  . ABDOMINAL HYSTERECTOMY    . AMPUTATION Right 03/29/2018   Procedure: AMPUTATION ABOVE KNEE;  Surgeon: Emma Mitchell, MD;  Location: [facility];  Service: Vascular;  Laterality: Right;  . APPENDECTOMY    . BACK SURGERY    . BYPASS GRAFT FEMORAL-PERONEAL Right 03/26/2018   Procedure: BYPASS GRAFT Campbell Stall;  Surgeon: Emma Sandy, MD;  Location: [facility];  Service: Vascular;  Laterality: Right;  . EXPLORATORY LAPAROTOMY     Adhesions removed and appendix  . FEMORAL-POPLITEAL BYPASS GRAFT Right 03/27/2018   Procedure: Right lower extremity angiogram, Right Femoral Peroneal artery bypass, Angioplasty Right Femoral Peroneal bypass, Thrombectomy of Femoral peroneal artery;  Surgeon: Emma Sandy, MD;  Location: [facility];  Service: Vascular;  Laterality: Right;  . INTRAOPERATIVE ARTERIOGRAM Right 03/26/2018   Procedure: Intra Operative Arteriogram;  Surgeon: Emma Sandy, MD;  Location: [facility];  Service: Vascular;  Laterality: Right;  . LOWER EXTREMITY ANGIOGRAPHY Bilateral 03/25/2018   Procedure: LOWER EXTREMITY ANGIOGRAPHY;  Surgeon: Emma Sandy, MD;  Location: [facility];  Service: Cardiovascular;  Laterality: Bilateral;  . LUMBAR LAMINECTOMY/DECOMPRESSION MICRODISCECTOMY N/A 09/09/2014   Procedure: L4-L5 DECOMPRESSION ;  Surgeon: Emma Schools, MD;  Location: [place];  Service: Orthopedics;  Laterality: N/A;  . PATCH ANGIOPLASTY Right 03/26/2018   Procedure: PATCH ANGIOPLASTY;  Surgeon: Emma Sandy, MD;  Location: [facility];  Service: Vascular;  Laterality: Right;  . SHOULDER SURGERY    . THYROID SURGERY    . TONSILLECTOMY    . TOTAL THYROIDECTOMY    . VEIN HARVEST Right 03/26/2018   Procedure: Vein Harvest of Right Leg Greater Saphenous Vein in SITU;  Surgeon: Emma Sandy, MD;  Location: [place] [facility];  Service: Vascular;  Laterality: Right;    There were no vitals filed for this visit.  Subjective Assessment - 12/09/18 1400    Subjective  She has not heard from Dr  Emma [name] about HHPT.  No falls. 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  No/denies    Pain Onset  In the past 7 days                       [facility] Adult [name] Treatment/Exercise - 12/09/18 1400       Transfers   Transfers  Sit to Stand;Stand to Sit    Sit to Stand  5: Supervision;With upper extremity assist;With armrests;From chair/3-in-1   supervision w/ increased time chairs without armrests, to RW   Sit to Stand Details  Verbal cues for technique;Verbal cues for safe use of DME/AE    Stand to Sit  5: Supervision;With upper extremity assist;With armrests;To chair/3-in-1   to chairs with & without armrests   Stand to Sit Details (indicate cue type and reason)  Verbal cues for safe use of DME/AE;Verbal cues for technique      Ambulation/Gait   Ambulation/Gait  Yes    Ambulation/Gait Assistance  5: Supervision    Ambulation/Gait Assistance Details  verbal cues on upright posture, position w/in RW, time holding prosthetic foot in air prior to making initial contact and wt shift over prosthesis in stance.     Ambulation Distance (Feet)  100 Feet    Assistive device  Rolling walker;Prosthesis    Gait Pattern  Step-to pattern;Decreased step length - left;Decreased stance time - right;Decreased hip/knee flexion - right;Decreased weight shift to right;Right steppage;Antalgic;Lateral hip instability;Trunk flexed    Ambulation Surface  Indoor;Level    Ramp  4: Min assist   RW & TFA prosthesis    Ramp Details (indicate cue type and reason)  tactile & verbal cues on wt shift & upright posture.  [name] is fearful     Curb  4: Min assist   RW & TFA prosthesis    Curb Details (indicate cue type and reason)  tactile/manual cues on wt shift over prosthesis in stance, sequencing/technique       Prosthetics   Prosthetic Care Comments   verbal cues on need to increase wear of prosthesis at home and attempt to toilet with [facility] over guest bathroom. Initially attempt when no need to toilet so she can take her time.     Current prosthetic wear tolerance (days/week)   2 times in last week outside of [name]     Current prosthetic wear tolerance (#hours/day)   3-4 hrs until needs to toilet    Residual limb  condition   no new color changes or bruising noted, no new edema, increased tenderness distal femur & medial limb,      Education Provided  Skin check;Residual limb care;Correct ply sock adjustment;Proper Donning;Proper wear schedule/adjustment    Person(s) Educated  Patient    Education Method  Explanation;Verbal cues    Education Method  Verbalized understanding;Verbal cues required;Needs further instruction    Donning Prosthesis  Supervision    Doffing Prosthesis  Modified independent (device/increased time)               [name] Short Term Goals - 12/09/18 1900      [name] SHORT TERM GOAL #1  Title  Patient transfers stand pivot with RW between chair with armrests & chair without armrest using UEs with supervision. (ALL STGs updated for target date of 12/11/18)    Baseline  MET 12/09/2018    Time  4    Period  Weeks    Status  Achieved    Target Date  12/11/18      [name] SHORT TERM GOAL #2   Title  Patient tolerates wearing prosthesis >/= 5 day/wk  >3hrs/day & liner >8hrs total /day without skin issues.    Baseline  Not MET 12/09/2018  Patient has become fearful with 2 falls in last few weeks. She also continues to report unable to toilet with prosthesis.    Time  4    Period  Weeks    Status  Not Met    Target Date  12/11/18      [name] SHORT TERM GOAL #3   Title  Patient demonstrates ability to toilet with prosthesis including managing pants & sit/stand with supervision.    Baseline  NOT MET 12/09/2018  [name] recommended placing BSC over toilet in guest bathroom which has more room but patient has not been able to have someone place BSC.    Time  4    Period  Weeks    Status  Not Met    Target Date  12/11/18      [name] SHORT TERM GOAL #4   Title  Patient ambulates 100' with RW & prosthesis with supervision.    Baseline  MET 12/09/2018    Time  4    Period  Weeks    Status  Achieved    Target Date  12/11/18      [name] SHORT TERM GOAL #5   Title  Standing balance with RW support  reaches 5" anteriorly & pick up item from floor with supervision.    Baseline  NOT MET 12/09/2018    Time  4    Period  Weeks    Status  Not Met    Target Date  12/11/18      [name] SHORT TERM GOAL #6   Title  Patient negotiates ramps & curbs with RW & prosthesis with minimal assist.    Baseline  MET 12/09/2018    Time  4    Period  Weeks    Status  Achieved    Target Date  12/11/18        [name] Long Term Goals - 10/08/18 0841      [name] LONG TERM GOAL #1   Title  Patient demonstrates & verbalizes proper prosthetic care to enable safe use of prosthesis.  (All LTGs Target Date: 01/03/2019)    Time  12    Period  Weeks    Status  On-going    Target Date  01/03/19      [name] LONG TERM GOAL #2   Title  Patient tolerates prosthesis wear >80% of awake hours to improve ability to function during her day.    Time  12    Period  Weeks    Status  On-going    Target Date  01/03/19      [name] LONG TERM GOAL #3   Title  Patient standing balance with RW support & prosthesis: static no UE support 1 minute, dynamic RW support scans, reaches 7" anteriorly & manages clothes modified independent.    Time  12    Period  Weeks    Status  On-going  Target Date  01/03/19      [name] LONG TERM GOAL #4   Title  Patient ambulates 200' with RW & prosthesis modifiied independent.    Time  12    Period  Weeks    Status  On-going    Target Date  01/03/19      [name] LONG TERM GOAL #5   Title  Patient negotiates ramps & curbs with RW & prosthesis modified independent.    Time  12    Period  Weeks    Status  On-going    Target Date  01/03/19            Plan - 12/09/18 1904    Clinical Impression Statement  Patient met 3 of 6 STGs.  She continues to limit wear outside of [name] due to fear & inability to toilet with prosthesis.  Patient appears would benefit from HHPT for short period to increase safety and function within her home.  She is receptive to this idea but have not heard from Dr. Vista Lawman     Personal Factors and Comorbidities  Age;Comorbidity 3+;Fitness;Past/Current Experience;Time since onset of injury/illness/exacerbation;Other;Transportation   lives alone so limited ability to practice tasks that require assist or supervision   Comorbidities  R TFA, L4-5 decompression sg 2016, DM, HTN,    Examination-Activity Limitations  Bend;Caring for Others;Carry;Locomotion Level;Reach Overhead;Stairs;Stand;Toileting;Transfers    Examination-Participation Restrictions  Community Activity;Church;Meal Prep    Stability/Clinical Decision Making  Evolving/Moderate complexity    Rehab Potential  Good    [name] Frequency  2x / week    [name] Duration  12 weeks    [name] Treatment/Interventions  ADLs/Self Care Home Management;DME Instruction;Gait training;Stair training;Functional mobility training;Therapeutic activities;Therapeutic exercise;Balance training;Neuromuscular re-education;Patient/family education;Prosthetic Training;Vestibular;Passive range of motion;Manual techniques    [name] Next Visit Plan  check on request to MD for [place], work towards [place], Emma if she has been able to have [place] placed over her toilet in guest bathroom    [name] Home Exercise Plan  Medbridge Access Code: [email or web address]    Consulted and Agree with Plan of Care  Patient       Patient will benefit from skilled therapeutic intervention in order to improve the following deficits and impairments:  Abnormal gait, Decreased activity tolerance, Decreased balance, Decreased endurance, Decreased knowledge of use of DME, Decreased mobility, Decreased range of motion, Decreased strength, Dizziness, Increased edema, Impaired flexibility, Postural dysfunction, Prosthetic Dependency, Pain, Obesity  Visit Diagnosis: Other abnormalities of gait and mobility  Unsteadiness on feet  Abnormal posture  Muscle weakness     Problem List Patient Active Problem List   Diagnosis Date Noted  . PAD (peripheral artery disease) ([place]) 03/23/2018  . Back  pain 09/09/2014  . HYPERLIPIDEMIA 12/18/2006  . RECTAL POLYPS 12/18/2006  . 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 SBO 08/01/2006  . GOITER NOS 03/11/2006  . PERIPHERAL NEUROPATHY 03/11/2006  . HYPERTENSION 03/11/2006  . OSTEOPENIA 03/11/2006  . FOOT DROP 03/11/2006    Emma [name], Emma [name] 12/10/2018, 9:07 AM  [facility] [address] [place], [place], [address] Phone: [phone]   Fax:  [phone]  Name: Emma [name] MRN: [id number] Date of Birth: 07-23-1945

## 2018-12-16 ENCOUNTER — Ambulatory Visit: Payer: Medicare HMO | Admitting: Physical Therapy

## 2018-12-18 ENCOUNTER — Ambulatory Visit: Payer: Medicare HMO | Admitting: Physical Therapy

## 2018-12-23 ENCOUNTER — Encounter: Payer: Self-pay | Admitting: Physical Therapy

## 2018-12-23 ENCOUNTER — Ambulatory Visit: Payer: Medicare HMO | Attending: Internal Medicine | Admitting: Physical Therapy

## 2018-12-23 ENCOUNTER — Other Ambulatory Visit: Payer: Self-pay

## 2018-12-23 DIAGNOSIS — M6281 Muscle weakness (generalized): Secondary | ICD-10-CM | POA: Diagnosis not present

## 2018-12-23 DIAGNOSIS — I739 Peripheral vascular disease, unspecified: Secondary | ICD-10-CM | POA: Diagnosis not present

## 2018-12-23 DIAGNOSIS — R2681 Unsteadiness on feet: Secondary | ICD-10-CM | POA: Insufficient documentation

## 2018-12-23 DIAGNOSIS — R2689 Other abnormalities of gait and mobility: Secondary | ICD-10-CM | POA: Diagnosis not present

## 2018-12-23 DIAGNOSIS — E119 Type 2 diabetes mellitus without complications: Secondary | ICD-10-CM | POA: Diagnosis not present

## 2018-12-23 DIAGNOSIS — M25651 Stiffness of right hip, not elsewhere classified: Secondary | ICD-10-CM | POA: Diagnosis not present

## 2018-12-23 DIAGNOSIS — R293 Abnormal posture: Secondary | ICD-10-CM | POA: Insufficient documentation

## 2018-12-23 DIAGNOSIS — Z89611 Acquired absence of right leg above knee: Secondary | ICD-10-CM | POA: Diagnosis not present

## 2018-12-23 NOTE — Therapy (Signed)
[facility] [address] [place], [place], [address] Phone: [phone]   Fax:  [phone]  Physical Therapy Treatment  Patient Details  Name: Emma [name] MRN: [id number] Date of Birth: [birth year] Referring Provider (PT): Benito Mccreedy, MD   Encounter Date: 12/23/2018  PT End of Session - 12/23/18 1625    Visit Number  27    Number of Visits  38    Date for PT Re-Evaluation  01/03/19    Authorization Type  HUMANA  Medicare - 10th visit PN required    PT Start Time  1400    PT Stop Time  1447    PT Time Calculation (min)  47 min    Equipment Utilized During Treatment  Other (comment)   BSC   Activity Tolerance  Patient tolerated treatment well    Behavior During Therapy  [facility] for tasks assessed/performed       Past Medical History:  Diagnosis Date  . Arthritis    Back and right shoulder  . Constipation   . Diabetes mellitus    Type II  . Diabetes mellitus without complication ([place])   . Diabetic neuropathy ([place])   . History of blood transfusion   . Hypercholesteremia   . Hypertension   . Hypothyroidism   . Pneumonia   . Thyroid disease     Past Surgical History:  Procedure Laterality Date  . ABDOMINAL AORTOGRAM N/A 03/25/2018   Procedure: ABDOMINAL AORTOGRAM;  Surgeon: Waynetta Sandy, MD;  Location: [place] [facility];  Service: Cardiovascular;  Laterality: N/A;  . ABDOMINAL HYSTERECTOMY    . AMPUTATION Right 03/29/2018   Procedure: AMPUTATION ABOVE KNEE;  Surgeon: Serafina Mitchell, MD;  Location: [facility];  Service: Vascular;  Laterality: Right;  . APPENDECTOMY    . BACK SURGERY    . BYPASS GRAFT FEMORAL-PERONEAL Right 03/26/2018   Procedure: BYPASS GRAFT Campbell Stall;  Surgeon: Waynetta Sandy, MD;  Location: [facility];  Service: Vascular;  Laterality: Right;  . EXPLORATORY LAPAROTOMY     Adhesions removed and appendix  . FEMORAL-POPLITEAL BYPASS GRAFT Right 03/27/2018    Procedure: Right lower extremity angiogram, Right Femoral Peroneal artery bypass, Angioplasty Right Femoral Peroneal bypass, Thrombectomy of Femoral peroneal artery;  Surgeon: Waynetta Sandy, MD;  Location: [facility];  Service: Vascular;  Laterality: Right;  . INTRAOPERATIVE ARTERIOGRAM Right 03/26/2018   Procedure: Intra Operative Arteriogram;  Surgeon: Waynetta Sandy, MD;  Location: [facility];  Service: Vascular;  Laterality: Right;  . LOWER EXTREMITY ANGIOGRAPHY Bilateral 03/25/2018   Procedure: LOWER EXTREMITY ANGIOGRAPHY;  Surgeon: Waynetta Sandy, MD;  Location: [facility];  Service: Cardiovascular;  Laterality: Bilateral;  . LUMBAR LAMINECTOMY/DECOMPRESSION MICRODISCECTOMY N/A 09/09/2014   Procedure: L4-L5 DECOMPRESSION ;  Surgeon: Melina Schools, MD;  Location: [facility];  Service: Orthopedics;  Laterality: N/A;  . PATCH ANGIOPLASTY Right 03/26/2018   Procedure: PATCH ANGIOPLASTY;  Surgeon: Waynetta Sandy, MD;  Location: [facility];  Service: Vascular;  Laterality: Right;  . SHOULDER SURGERY    . THYROID SURGERY    . TONSILLECTOMY    . TOTAL THYROIDECTOMY    . VEIN HARVEST Right 03/26/2018   Procedure: Vein Harvest of Right Leg Greater Saphenous Vein in SITU;  Surgeon: Waynetta Sandy, MD;  Location: [facility];  Service: Vascular;  Laterality: Right;    There were no vitals filed for this visit.  Subjective Assessment - 12/23/18 1400    Subjective  No more falls in last  2 weeks. She walked with a neighbor once but the friend was nervous about it. She has not heard from Dr. Iona Beard [place]se[place]ons[place] about home health.  She has not found anyone to help her put [facility] over toilet.    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  No/denies    Pain Onset  In the past 7 days                        [facility] Adult PT Treatment/Exercise - 12/23/18 1624      Transfers   Transfers  Sit to Stand;Stand to Sit    Sit to Stand  5: Supervision;With upper extremity assist;With armrests;From chair/3-in-1   supervision w/ increased time chairs without armrests, to RW   Sit to Stand Details  Verbal cues for technique;Verbal cues for safe use of DME/AE    Stand to Sit  5: Supervision;With upper extremity assist;With armrests;To chair/3-in-1   to chairs with & without armrests   Stand to Sit Details (indicate cue type and reason)  Verbal cues for safe use of DME/AE;Verbal cues for technique      Ambulation/Gait   Ambulation/Gait  Yes    Ambulation/Gait Assistance  5: Supervision;3: Mod assist   modA 1x for balance loss   Ambulation/Gait Assistance Details  ModA for balance loss (patient unlocked prosthetic knee but did not advance prosthesis & then attempted to step with sound limb weight bearing on prosthesis with prosthetic knee still flexed).  PT demo to patient her error which would have resulted in fall without PT.      Ambulation Distance (Feet)  40 Feet   40' X 2   Assistive device  Rolling walker;Prosthesis    Gait Pattern  Step-to pattern;Decreased step length - left;Decreased stance time - right;Decreased hip/knee flexion - right;Decreased weight shift to right;Right steppage;Antalgic;Lateral hip instability;Trunk flexed    Ambulation Surface  Indoor;Level    Ramp  --    Curb  --      Prosthetics   Prosthetic Care Comments   verbal cues on need to increase wear of prosthesis at home and attempt to toilet with [facility] over guest bathroom. Initially attempt when no need to toilet so she can take her time.     Current prosthetic wear tolerance (days/week)   4 times in last 12 days since last PT appt    Current prosthetic wear tolerance (#hours/day)   3-4 hrs until needs to toilet    Residual limb condition   no new color changes or bruising noted, no new edema,  increased tenderness distal femur & medial limb,      Education Provided  Skin check;Residual limb care;Correct ply sock adjustment;Proper Donning;Proper wear schedule/adjustment    Person(s) Educated  Patient    Education Method  Explanation;Verbal cues    Education Method  Verbalized understanding;Verbal cues required;Needs further instruction    Donning Prosthesis  Supervision    Doffing Prosthesis  Modified independent (device/increased time)               PT Short Term Goals - 12/09/18 1900      PT SHORT TERM GOAL #1   Title  Patient transfers stand pivot with RW between chair with armrests & chair without armrest using UEs with  supervision. (ALL STGs updated for target date of 12/11/18)    Baseline  MET 12/09/2018    Time  4    Period  Weeks    Status  Achieved    Target Date  12/11/18      PT SHORT TERM GOAL #2   Title  Patient tolerates wearing prosthesis >/= 5 day/wk  >3hrs/day & liner >8hrs total /day without skin issues.    Baseline  Not MET 12/09/2018  Patient has become fearful with 2 falls in last few weeks. She also continues to report unable to toilet with prosthesis.    Time  4    Period  Weeks    Status  Not Met    Target Date  12/11/18      PT SHORT TERM GOAL #3   Title  Patient demonstrates ability to toilet with prosthesis including managing pants & sit/stand with supervision.    Baseline  NOT MET 12/09/2018  PT recommended placing BSC over toilet in guest bathroom which has more room but patient has not been able to have someone place BSC.    Time  4    Period  Weeks    Status  Not Met    Target Date  12/11/18      PT SHORT TERM GOAL #4   Title  Patient ambulates 100' with RW & prosthesis with supervision.    Baseline  MET 12/09/2018    Time  4    Period  Weeks    Status  Achieved    Target Date  12/11/18      PT SHORT TERM GOAL #5   Title  Standing balance with RW support reaches 5" anteriorly & pick up item from floor with supervision.     Baseline  NOT MET 12/09/2018    Time  4    Period  Weeks    Status  Not Met    Target Date  12/11/18      PT SHORT TERM GOAL #6   Title  Patient negotiates ramps & curbs with RW & prosthesis with minimal assist.    Baseline  MET 12/09/2018    Time  4    Period  Weeks    Status  Achieved    Target Date  12/11/18        PT Long Term Goals - 10/08/18 0841      PT LONG TERM GOAL #1   Title  Patient demonstrates & verbalizes proper prosthetic care to enable safe use of prosthesis.  (All LTGs Target Date: 01/03/2019)    Time  12    Period  Weeks    Status  On-going    Target Date  01/03/19      PT LONG TERM GOAL #2   Title  Patient tolerates prosthesis wear >80% of awake hours to improve ability to function during her day.    Time  12    Period  Weeks    Status  On-going    Target Date  01/03/19      PT LONG TERM GOAL #3   Title  Patient standing balance with RW support & prosthesis: static no UE support 1 minute, dynamic RW support scans, reaches 7" anteriorly & manages clothes modified independent.    Time  12    Period  Weeks    Status  On-going    Target Date  01/03/19      PT LONG TERM GOAL #4   Title  Patient  ambulates 200' with RW & prosthesis modifiied independent.    Time  12    Period  Weeks    Status  On-going    Target Date  01/03/19      PT LONG TERM GOAL #5   Title  Patient negotiates ramps & curbs with RW & prosthesis modified independent.    Time  12    Period  Weeks    Status  On-going    Target Date  01/03/19            Plan - 12/23/18 2247    Clinical Impression Statement  Patient continues to make minimal to no progress due to limited wear & ability to practice outside of PT. She would benefit from HHPT & an aide to improve safety & function in her home.  Patient to call MD regarding referral to [place] aide.    Personal Factors and Comorbidities  Age;Comorbidity 3+;Fitness;Past/Current Experience;Time since onset of  injury/illness/exacerbation;Other;Transportation   lives alone so limited ability to practice tasks that require assist or supervision   Comorbidities  R TFA, L4-5 decompression sg 2016, DM, HTN,    Examination-Activity Limitations  Bend;Caring for Others;Carry;Locomotion Level;Reach Overhead;Stairs;Stand;Toileting;Transfers    Examination-Participation Restrictions  Community Activity;Church;Meal Prep    Stability/Clinical Decision Making  Evolving/Moderate complexity    Rehab Potential  Good    PT Frequency  2x / week    PT Duration  12 weeks    PT Treatment/Interventions  ADLs/Self Care Home Management;DME Instruction;Gait training;Stair training;Functional mobility training;Therapeutic activities;Therapeutic exercise;Balance training;Neuromuscular re-education;Patient/family education;Prosthetic Training;Vestibular;Passive range of motion;Manual techniques    PT Next Visit Plan  check on request to MD for [place], work towards [place], see if she has been able to have [place] placed over her toilet in guest bathroom    PT Home Exercise Plan  Medbridge Access Code: [email or web address]    Consulted and Agree with Plan of Care  Patient       Patient will benefit from skilled therapeutic intervention in order to improve the following deficits and impairments:  Abnormal gait, Decreased activity tolerance, Decreased balance, Decreased endurance, Decreased knowledge of use of DME, Decreased mobility, Decreased range of motion, Decreased strength, Dizziness, Increased edema, Impaired flexibility, Postural dysfunction, Prosthetic Dependency, Pain, Obesity  Visit Diagnosis: Other abnormalities of gait and mobility  Unsteadiness on feet  Abnormal posture  Muscle weakness     Problem List Patient Active Problem List   Diagnosis Date Noted  . PAD (peripheral artery disease) ([place]) 03/23/2018  . Back pain 09/09/2014  . HYPERLIPIDEMIA 12/18/2006  . RECTAL POLYPS 12/18/2006  . 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 SBO  08/01/2006  . GOITER NOS 03/11/2006  . PERIPHERAL NEUROPATHY 03/11/2006  . HYPERTENSION 03/11/2006  . OSTEOPENIA 03/11/2006  . FOOT DROP 03/11/2006    Jamey Reas PT, DPT 12/23/2018, 10:51 PM  [facility] [address] [place], [place], [address] Phone: [phone]   Fax:  [phone]  Name: Emma [name] MRN: [id number] Date of Birth: 01/11/1946

## 2018-12-25 ENCOUNTER — Encounter: Payer: Self-pay | Admitting: Physical Therapy

## 2018-12-25 ENCOUNTER — Other Ambulatory Visit: Payer: Self-pay

## 2018-12-25 ENCOUNTER — Ambulatory Visit: Payer: Medicare HMO | Admitting: Physical Therapy

## 2018-12-25 DIAGNOSIS — M6281 Muscle weakness (generalized): Secondary | ICD-10-CM | POA: Diagnosis not present

## 2018-12-25 DIAGNOSIS — M25651 Stiffness of right hip, not elsewhere classified: Secondary | ICD-10-CM | POA: Diagnosis not present

## 2018-12-25 DIAGNOSIS — R293 Abnormal posture: Secondary | ICD-10-CM | POA: Diagnosis not present

## 2018-12-25 DIAGNOSIS — R2681 Unsteadiness on feet: Secondary | ICD-10-CM | POA: Diagnosis not present

## 2018-12-25 DIAGNOSIS — R2689 Other abnormalities of gait and mobility: Secondary | ICD-10-CM

## 2018-12-26 NOTE — Therapy (Signed)
[facility] [address] [place], [place], [address] Phone: [phone]   Fax:  [phone]  Physical Therapy Treatment  Patient Details  Name: Emma [name] MRN: [id number] Date of Birth: Jun 23, 1945 Referring Provider (PT): Benito Mccreedy, MD   Encounter Date: 12/25/2018  PT End of Session - 12/25/18 1858    Visit Number  28    Number of Visits  38    Date for PT Re-Evaluation  01/03/19    Authorization Type  HUMANA  Medicare - 10th visit PN required    PT Start Time  4332    PT Stop Time  1825    PT Time Calculation (min)  38 min    Equipment Utilized During Treatment  Other (comment)   BSC   Activity Tolerance  Patient tolerated treatment well    Behavior During Therapy  [facility] for tasks assessed/performed       Past Medical History:  Diagnosis Date  . Arthritis    Back and right shoulder  . Constipation   . Diabetes mellitus    Type II  . Diabetes mellitus without complication ([place])   . Diabetic neuropathy ([place])   . History of blood transfusion   . Hypercholesteremia   . Hypertension   . Hypothyroidism   . Pneumonia   . Thyroid disease     Past Surgical History:  Procedure Laterality Date  . ABDOMINAL AORTOGRAM N/A 03/25/2018   Procedure: ABDOMINAL AORTOGRAM;  Surgeon: Waynetta Sandy, MD;  Location: [place] [facility];  Service: Cardiovascular;  Laterality: N/A;  . ABDOMINAL HYSTERECTOMY    . AMPUTATION Right 03/29/2018   Procedure: AMPUTATION ABOVE KNEE;  Surgeon: Serafina Mitchell, MD;  Location: [place];  Service: Vascular;  Laterality: Right;  . APPENDECTOMY    . BACK SURGERY    . BYPASS GRAFT FEMORAL-PERONEAL Right 03/26/2018   Procedure: BYPASS GRAFT [place];  Surgeon: Waynetta Sandy, MD;  Location: [place];  Service: Vascular;  Laterality: Right;  . EXPLORATORY LAPAROTOMY     Adhesions removed and appendix  . FEMORAL-POPLITEAL BYPASS GRAFT Right 03/27/2018    Procedure: Right lower extremity angiogram, Right Femoral Peroneal artery bypass, Angioplasty Right Femoral Peroneal bypass, Thrombectomy of Femoral peroneal artery;  Surgeon: Waynetta Sandy, MD;  Location: [place];  Service: Vascular;  Laterality: Right;  . INTRAOPERATIVE ARTERIOGRAM Right 03/26/2018   Procedure: Intra Operative Arteriogram;  Surgeon: Waynetta Sandy, MD;  Location: [place];  Service: Vascular;  Laterality: Right;  . LOWER EXTREMITY ANGIOGRAPHY Bilateral 03/25/2018   Procedure: LOWER EXTREMITY ANGIOGRAPHY;  Surgeon: Waynetta Sandy, MD;  Location: [place] [facility];  Service: Cardiovascular;  Laterality: Bilateral;  . LUMBAR LAMINECTOMY/DECOMPRESSION MICRODISCECTOMY N/A 09/09/2014   Procedure: L4-L5 DECOMPRESSION ;  Surgeon: Melina Schools, MD;  Location: [place];  Service: Orthopedics;  Laterality: N/A;  . PATCH ANGIOPLASTY Right 03/26/2018   Procedure: PATCH ANGIOPLASTY;  Surgeon: Waynetta Sandy, MD;  Location: [place];  Service: Vascular;  Laterality: Right;  . SHOULDER SURGERY    . THYROID SURGERY    . TONSILLECTOMY    . TOTAL THYROIDECTOMY    . VEIN HARVEST Right 03/26/2018   Procedure: Vein Harvest of Right Leg Greater Saphenous Vein in SITU;  Surgeon: Waynetta Sandy, MD;  Location: [place];  Service: Vascular;  Laterality: Right;    There were no vitals filed for this visit.  Subjective Assessment - 12/25/18 1745    Subjective  Dr Vista Lawman is going to  order HHPT & aide.    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    Pain Onset  In the past 7 days                       [facility] Adult PT Treatment/Exercise - 12/26/18 0001      Transfers   Transfers  Sit to Stand;Stand to Sit    Sit to Stand  5: Supervision;With upper extremity assist;With armrests;From chair/3-in-1    supervision w/ increased time chairs without armrests, to RW   Sit to Stand Details  Verbal cues for technique;Verbal cues for safe use of DME/AE    Stand to Sit  5: Supervision;With upper extremity assist;With armrests;To chair/3-in-1   to chairs with & without armrests   Stand to Sit Details (indicate cue type and reason)  Verbal cues for safe use of DME/AE;Verbal cues for technique      Ambulation/Gait   Ambulation/Gait  Yes    Ambulation/Gait Assistance  5: Supervision;4: Min assist   MinA for balance in turns around furniture   Ambulation/Gait Assistance Details  verbal & tactile cues on upright posture, initial contact to extend prosthetic knee, wt shift over prosthesis and step width (adducting which causes hip instability).   Pt had to stop 2nd gait due to vascular pain in LLE which she reports has gotten worse over last few weeks.      Ambulation Distance (Feet)  40 Feet   40' X 3   Assistive device  Rolling walker;Prosthesis    Gait Pattern  Step-to pattern;Decreased step length - left;Decreased stance time - right;Decreased hip/knee flexion - right;Decreased weight shift to right;Right steppage;Antalgic;Lateral hip instability;Trunk flexed    Ambulation Surface  Level;Indoor      Self-Care   Self-Care  Other Self-Care Comments    Other Self-Care Comments   With permission of pt, PT called her apt complex office to request weekly assistance with getting her mail and taking her trash to dumpster.  The assistant mgr said she would work something out.  PT made them aware of ADA and reasonable accommodations for individuals with disabilities.       Prosthetics   Prosthetic Care Comments   PT instructed pt in manual lock knee on prosthesis and rationale why it may improve her safety.  Pt has appt with prosthetist on 12/18 already for pads in socket & alignment check. PT left voice mail for Willette Alma, her [facility] to consider switching to lock knee.     Current prosthetic wear tolerance  (days/week)   Only when has PT over last week.     Current prosthetic wear tolerance (#hours/day)   only during PT session over last week as removes so she can toilet quicker when she gets home.      Residual limb condition   no new color changes or bruising noted, no new edema, increased tenderness distal femur & medial limb,      Education Provided  Skin check;Residual limb care;Correct ply sock adjustment;Proper Donning;Proper wear schedule/adjustment    Person(s) Educated  Patient    Education Method  Explanation;Verbal cues    Education Method  Verbalized understanding;Needs further instruction    Donning Prosthesis  Supervision   significant increased time   Doffing Prosthesis  Modified independent (device/increased time)  PT Short Term Goals - 12/09/18 1900      PT SHORT TERM GOAL #1   Title  Patient transfers stand pivot with RW between chair with armrests & chair without armrest using UEs with supervision. (ALL STGs updated for target date of 12/11/18)    Baseline  MET 12/09/2018    Time  4    Period  Weeks    Status  Achieved    Target Date  12/11/18      PT SHORT TERM GOAL #2   Title  Patient tolerates wearing prosthesis >/= 5 day/wk  >3hrs/day & liner >8hrs total /day without skin issues.    Baseline  Not MET 12/09/2018  Patient has become fearful with 2 falls in last few weeks. She also continues to report unable to toilet with prosthesis.    Time  4    Period  Weeks    Status  Not Met    Target Date  12/11/18      PT SHORT TERM GOAL #3   Title  Patient demonstrates ability to toilet with prosthesis including managing pants & sit/stand with supervision.    Baseline  NOT MET 12/09/2018  PT recommended placing BSC over toilet in guest bathroom which has more room but patient has not been able to have someone place BSC.    Time  4    Period  Weeks    Status  Not Met    Target Date  12/11/18      PT SHORT TERM GOAL #4   Title  Patient ambulates  100' with RW & prosthesis with supervision.    Baseline  MET 12/09/2018    Time  4    Period  Weeks    Status  Achieved    Target Date  12/11/18      PT SHORT TERM GOAL #5   Title  Standing balance with RW support reaches 5" anteriorly & pick up item from floor with supervision.    Baseline  NOT MET 12/09/2018    Time  4    Period  Weeks    Status  Not Met    Target Date  12/11/18      PT SHORT TERM GOAL #6   Title  Patient negotiates ramps & curbs with RW & prosthesis with minimal assist.    Baseline  MET 12/09/2018    Time  4    Period  Weeks    Status  Achieved    Target Date  12/11/18        PT Long Term Goals - 10/08/18 0841      PT LONG TERM GOAL #1   Title  Patient demonstrates & verbalizes proper prosthetic care to enable safe use of prosthesis.  (All LTGs Target Date: 01/03/2019)    Time  12    Period  Weeks    Status  On-going    Target Date  01/03/19      PT LONG TERM GOAL #2   Title  Patient tolerates prosthesis wear >80% of awake hours to improve ability to function during her day.    Time  12    Period  Weeks    Status  On-going    Target Date  01/03/19      PT LONG TERM GOAL #3   Title  Patient standing balance with RW support & prosthesis: static no UE support 1 minute, dynamic RW support scans, reaches 7" anteriorly & manages clothes modified independent.  Time  12    Period  Weeks    Status  On-going    Target Date  01/03/19      PT LONG TERM GOAL #4   Title  Patient ambulates 200' with RW & prosthesis modifiied independent.    Time  12    Period  Weeks    Status  On-going    Target Date  01/03/19      PT LONG TERM GOAL #5   Title  Patient negotiates ramps & curbs with RW & prosthesis modified independent.    Time  12    Period  Weeks    Status  On-going    Target Date  01/03/19            Plan - 12/25/18 1910    Clinical Impression Statement  Patient was limited in distance with prosthetic gait today by LLE vascular pain. She  has an appt with [facility] on Monday, 12/14.  Patient is having increased difficulty maintaining prosthetic knee extended for stability and would probably benefit from manual lock knee.    Personal Factors and Comorbidities  Age;Comorbidity 3+;Fitness;Past/Current Experience;Time since onset of injury/illness/exacerbation;Other;Transportation   lives alone so limited ability to practice tasks that require assist or supervision   Comorbidities  R TFA, L4-5 decompression sg 2016, DM, HTN,    Examination-Activity Limitations  Bend;Caring for Others;Carry;Locomotion Level;Reach Overhead;Stairs;Stand;Toileting;Transfers    Examination-Participation Restrictions  Community Activity;Church;Meal Prep    Stability/Clinical Decision Making  Evolving/Moderate complexity    Rehab Potential  Good    PT Frequency  2x / week    PT Duration  12 weeks    PT Treatment/Interventions  ADLs/Self Care Home Management;DME Instruction;Gait training;Stair training;Functional mobility training;Therapeutic activities;Therapeutic exercise;Balance training;Neuromuscular re-education;Patient/family education;Prosthetic Training;Vestibular;Passive range of motion;Manual techniques    PT Next Visit Plan  assess LTGs and discharge.    PT Home Exercise Plan  Medbridge Access Code: [email or web address]    Consulted and Agree with Plan of Care  Patient       Patient will benefit from skilled therapeutic intervention in order to improve the following deficits and impairments:  Abnormal gait, Decreased activity tolerance, Decreased balance, Decreased endurance, Decreased knowledge of use of DME, Decreased mobility, Decreased range of motion, Decreased strength, Dizziness, Increased edema, Impaired flexibility, Postural dysfunction, Prosthetic Dependency, Pain, Obesity  Visit Diagnosis: Other abnormalities of gait and mobility  Unsteadiness on feet  Abnormal posture  Muscle weakness     Problem List Patient Active Problem List   Diagnosis  Date Noted  . PAD (peripheral artery disease) ([place]) 03/23/2018  . Back pain 09/09/2014  . HYPERLIPIDEMIA 12/18/2006  . RECTAL POLYPS 12/18/2006  . 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 SBO 08/01/2006  . GOITER NOS 03/11/2006  . PERIPHERAL NEUROPATHY 03/11/2006  . HYPERTENSION 03/11/2006  . OSTEOPENIA 03/11/2006  . FOOT DROP 03/11/2006    Jamey Reas PT, DPT 12/26/2018, 10:13 AM  [facility] [address] [place], [place], [address] Phone: [phone]   Fax:  [phone]  Name: Cerys Winget MRN: [id number] Date of Birth: [birth year]

## 2018-12-30 ENCOUNTER — Encounter: Payer: Self-pay | Admitting: Family

## 2018-12-30 ENCOUNTER — Encounter: Payer: Self-pay | Admitting: Physical Therapy

## 2018-12-30 ENCOUNTER — Other Ambulatory Visit: Payer: Self-pay

## 2018-12-30 ENCOUNTER — Ambulatory Visit: Payer: Medicare HMO | Admitting: Physical Therapy

## 2018-12-30 ENCOUNTER — Ambulatory Visit (HOSPITAL_COMMUNITY)
Admission: RE | Admit: 2018-12-30 | Discharge: 2018-12-30 | Disposition: A | Discharge status: Home / Self Care | Payer: Medicare HMO | Source: Ambulatory Visit | Attending: Family | Admitting: Family

## 2018-12-30 ENCOUNTER — Ambulatory Visit (INDEPENDENT_AMBULATORY_CARE_PROVIDER_SITE_OTHER): Payer: Medicare HMO | Admitting: Family

## 2018-12-30 ENCOUNTER — Telehealth: Payer: Self-pay | Admitting: *Deleted

## 2018-12-30 VITALS — BP 111/69 | HR 75 | Temp 97.1°F | Resp 16 | Ht 63.0 in | Wt 145.0 lb

## 2018-12-30 DIAGNOSIS — M25651 Stiffness of right hip, not elsewhere classified: Secondary | ICD-10-CM | POA: Diagnosis not present

## 2018-12-30 DIAGNOSIS — Z89611 Acquired absence of right leg above knee: Secondary | ICD-10-CM | POA: Diagnosis not present

## 2018-12-30 DIAGNOSIS — I739 Peripheral vascular disease, unspecified: Secondary | ICD-10-CM | POA: Insufficient documentation

## 2018-12-30 DIAGNOSIS — E1151 Type 2 diabetes mellitus with diabetic peripheral angiopathy without gangrene: Secondary | ICD-10-CM

## 2018-12-30 DIAGNOSIS — F17200 Nicotine dependence, unspecified, uncomplicated: Secondary | ICD-10-CM | POA: Diagnosis not present

## 2018-12-30 DIAGNOSIS — M6281 Muscle weakness (generalized): Secondary | ICD-10-CM

## 2018-12-30 DIAGNOSIS — R293 Abnormal posture: Secondary | ICD-10-CM | POA: Diagnosis not present

## 2018-12-30 DIAGNOSIS — R2689 Other abnormalities of gait and mobility: Secondary | ICD-10-CM | POA: Diagnosis not present

## 2018-12-30 DIAGNOSIS — R2681 Unsteadiness on feet: Secondary | ICD-10-CM

## 2018-12-30 DIAGNOSIS — I779 Disorder of arteries and arterioles, unspecified: Secondary | ICD-10-CM | POA: Diagnosis not present

## 2018-12-30 DIAGNOSIS — R6889 Other general symptoms and signs: Secondary | ICD-10-CM | POA: Diagnosis not present

## 2018-12-30 NOTE — Patient Instructions (Signed)

## 2018-12-30 NOTE — Progress Notes (Addendum)
Virtual Visit via Telephone Note  I was not able to connect with [facility]  on 12/30/2018 using the [employer] by telephone. I called [phone] with no response.   PCP: System, Pcp Not In  Chief Complaint: Follow up peripheral artery occlusive disease   History of Present Illness: Emma [name] is a 73 y.o. 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 Myra Gianotti for ischemic right leg. The patient has recently undergone multiple revisions and attempts at limb salvage.    03-25-18 aortogram by Dr. Randie Heinz demonstrated left SFA that occludes at the above-knee popliteal that seemed to reconstitute at the peroneal artery as well which gave off posterior tibial at the ankle.  Dr. Randie Heinz last evaluated pt on 05-03-18. At that time he referred her to [facility] clinic Dr. Randie Heinz ok'd her to return to work when she was ready although was still having mobility issues at that time.  Pt was to follow up in 6 months with repeat ABIs. Dr. Randie Heinz counseled her re protecting her left foot. He advised that the pt start aspirin.  Diabetic: Yes, last A1C result on file was 6.4 in 2016. BMP on 3-15- 20 showed a normal creatinine  Tobacco use: smoker  (according to her documented social, 1 ppd x 50 years)  Pt meds include: Statin :Yes Betablocker: No ASA: Not on her medication list, needs to take 81 mg daily Other anticoagulants/antiplatelets: no    Past Medical History:  Diagnosis Date  . Arthritis    Back and right shoulder  . Constipation   . Diabetes mellitus    Type II  . Diabetes mellitus without complication ([facility])   . Diabetic neuropathy ([facility])   . History of blood transfusion   . Hypercholesteremia   . Hypertension   . Hypothyroidism   . Pneumonia   . Thyroid disease     Past Surgical History:  Procedure Laterality Date  . ABDOMINAL AORTOGRAM N/A 03/25/2018   Procedure: ABDOMINAL AORTOGRAM;  Surgeon: Maeola Harman, MD;  Location: [facility] INVASIVE CV LAB;   Service: Cardiovascular;  Laterality: N/A;  . ABDOMINAL HYSTERECTOMY    . AMPUTATION Right 03/29/2018   Procedure: AMPUTATION ABOVE KNEE;  Surgeon: Nada Libman, MD;  Location: [facility] OR;  Service: Vascular;  Laterality: Right;  . APPENDECTOMY    . BACK SURGERY    . BYPASS GRAFT FEMORAL-PERONEAL Right 03/26/2018   Procedure: BYPASS GRAFT [place];  Surgeon: Maeola Harman, MD;  Location: [facility] OR;  Service: Vascular;  Laterality: Right;  . EXPLORATORY LAPAROTOMY     Adhesions removed and appendix  . FEMORAL-POPLITEAL BYPASS GRAFT Right 03/27/2018   Procedure: Right lower extremity angiogram, Right Femoral Peroneal artery bypass, Angioplasty Right Femoral Peroneal bypass, Thrombectomy of Femoral peroneal artery;  Surgeon: Maeola Harman, MD;  Location[facility] OR;  Service: Vascular;  Laterality: Right;  . INTRAOPERATIVE ARTERIOGRAM Right 03/26/2018   Procedure: Intra Operative Arteriogram;  Surgeon: Maeola Harman, MD;  Location: [facility] OR;  Service: Vascular;  Laterality: Right;  . LOWER EXTREMITY ANGIOGRAPHY Bilateral 03/25/2018   Procedure: LOWER EXTREMITY ANGIOGRAPHY;  Surgeon: Maeola Harman, MD;  Location: [facility] INVASIVE CV LAB;  Service: Cardiovascular;  Laterality: Bilateral;  . LUMBAR LAMINECTOMY/DECOMPRESSION MICRODISCECTOMY N/A 09/09/2014   Procedure: L4-L5 DECOMPRESSION ;  Surgeon: Venita Lick, MD;  Location: [facility] OR;  Service: Orthopedics;  Laterality: N/A;  . PATCH ANGIOPLASTY Right 03/26/2018   Procedure: PATCH ANGIOPLASTY;  Surgeon: Maeola Harman, MD;  Location: [facility] OR;  Service: Vascular;  Laterality: Right;  . SHOULDER SURGERY    . THYROID SURGERY    . TONSILLECTOMY    . TOTAL THYROIDECTOMY    . VEIN HARVEST Right 03/26/2018   Procedure: Vein Harvest of Right Leg Greater Saphenous Vein in SITU;  Surgeon: Waynetta Sandy, MD;  Location: [place];  Service: Vascular;  Laterality: Right;   Social History   Socioeconomic History   . Marital status: Unknown    Spouse name: Not on file  . Number of children: Not on file  . Years of education: Not on file  . Highest education level: Not on file  Occupational History  . Not on file  Tobacco Use  . Smoking status: Current Every Day Smoker    Packs/day: 1.00    Years: 50.00    Pack years: 50.00    Types: Cigarettes  . Smokeless tobacco: Never Used  Substance and Sexual Activity  . Alcohol use: Yes    Comment: occasional  . Drug use: Never  . Sexual activity: Not on file  Other Topics Concern  . Not on file  Social History Narrative   ** Merged History Encounter **       Social Determinants of Health   Financial Resource Strain:   . Difficulty of Paying Living Expenses: Not on file  Food Insecurity:   . Worried About Charity fundraiser in the Last Year: Not on file  . Ran Out of Food in the Last Year: Not on file  Transportation Needs:   . Lack of Transportation (Medical): Not on file  . Lack of Transportation (Non-Medical): Not on file  Physical Activity:   . Days of Exercise per Week: Not on file  . Minutes of Exercise per Session: Not on file  Stress:   . Feeling of Stress : Not on file  Social Connections:   . Frequency of Communication with Friends and Family: Not on file  . Frequency of Social Gatherings with Friends and Family: Not on file  . Attends Religious Services: Not on file  . Active Member of Clubs or Organizations: Not on file  . Attends Archivist Meetings: Not on file  . Marital Status: Not on file  Intimate Partner Violence:   . Fear of Current or Ex-Partner: Not on file  . Emotionally Abused: Not on file  . Physically Abused: Not on file  . Sexually Abused: Not on file     Current Meds  Medication Sig  . acetaminophen (TYLENOL) 650 MG CR tablet Take 1,300 mg by mouth daily as needed (back pain).  Marland Kitchen atorvastatin (LIPITOR) 10 MG tablet Take 10 mg by mouth daily.  Marland Kitchen gabapentin (NEURONTIN) 600 MG tablet Take  1,200 mg by mouth 2 (two) times daily with breakfast and lunch.  . glimepiride (AMARYL) 4 MG tablet Take 4 mg by mouth daily.  Marland Kitchen L-Methylfolate-B6-B12 (FOLTANX) 3-35-2 MG TABS Take 1 tablet by mouth See admin instructions. Take one tablet by mouth every 2-3 days  . levothyroxine (SYNTHROID, LEVOTHROID) 100 MCG tablet Take 100 mcg by mouth daily.  Marland Kitchen lisinopril (PRINIVIL,ZESTRIL) 10 MG tablet Take 10 mg by mouth daily.  . metFORMIN (GLUCOPHAGE) 1000 MG tablet Take 1,000 mg by mouth 2 (two) times daily with breakfast and lunch.  . metFORMIN (GLUMETZA) 500 MG (MOD) 24 hr tablet Take 500 mg by mouth daily with breakfast.   . methocarbamol (ROBAXIN) 500 MG tablet Take 1 tablet (500 mg total) by mouth 3 (three) times  daily as needed for muscle spasms.  . Multiple Vitamins-Minerals (MULTIVITAMIN & MINERAL PO) Take 1 tablet by mouth daily.  . Omega 3 1200 MG CAPS Take 1,200 mg by mouth 2 (two) times daily.  . ondansetron (ZOFRAN) 4 MG tablet Take 1 tablet (4 mg total) by mouth every 8 (eight) hours as needed for nausea or vomiting.  . Oxcarbazepine (TRILEPTAL) 300 MG tablet Take 300 mg by mouth daily.  . Tetrahydrozoline HCl (VISINE OP) Place 1 drop into both eyes daily as needed (dry eyes).      Observations/Objective:  ABI Findings (12-30-18): +--------+------------------+-----+--------+--------+ Right   Rt Pressure (mmHg)IndexWaveformComment  +--------+------------------+-----+--------+--------+ [id number]                                      +--------+------------------+-----+--------+--------+  +--------+------------------+-----+-------------------+-------+ Left    Lt Pressure (mmHg)IndexWaveform           Comment +--------+------------------+-----+-------------------+-------+ [email or web address]                                                +--------+------------------+-----+-------------------+-------+ PTA     33                0.34 dampened monophasic         +--------+------------------+-----+-------------------+-------+ DP      41                0.43 dampened monophasic        +--------+------------------+-----+-------------------+-------+  +-------+-----------+-----------+------------+------------+ ABI/TBIToday's ABIToday's TBIPrevious ABIPrevious TBI +-------+-----------+-----------+------------+------------+ Right  AKA                                            +-------+-----------+-----------+------------+------------+ Left   0.43       -          0.57        0.18         +-------+-----------+-----------+------------+------------+ Summary: Left: Resting left ankle-brachial index indicates severe left lower extremity arterial disease. Unable to obtain toe-brachial indeces due to low amplitude of PPG waveform.    Assessment and Plan: Emma [name] is a 73 y.o. 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 Myra GianottiBrabham for ischemic right leg. The patient has recently undergone multiple revisions and attempts at limb salvage.    03-25-18 aortogram by Dr. Randie Heinzain demonstrated left SFA that occludes at the above-knee popliteal that seemed to reconstitute at the peroneal artery as well which gave off posterior tibial at the ankle.  I was unable to connect with pt by telephone, 2 attempts made to call [phone] with no response.   She is supposed to be taking 81 mg daily ASA, is not on her medication list. Her medication list does include a daily statin.  According to her documented social history she is smoking 1 ppd x 50 years. She also has DM, no recent A1C result on file.   Her atherosclerotic risk factors include DM and current smoking (x50 years)  Left ABI today shows a decline from 0.57 on 03-24-18 to 0.43 today, dampened monophasic waveforms.   I received a message today (01-01-19), that pt called and requested that I leave a message on her voicemail as  to what her results were. I let her know  that the overall circulation to her left ankle is a bit worse than it was 9 months prior. Since her DM is in good good control, I left the message that her smoking is her primary "artery clogging" risk factor. I left the 1-800-QUIT-NOW phone number, explained that this is free help through the [facility] and that she may discuss with her PCP a prescription medication that would decrease the urge to smoke.  I then was able to speak with pt. She is not walking as she states she needs more physical therapy, states she does have a right AKA prosthesis from [employer], but is unable to walk with it. She requested home physical therapy. Will request this for her, and for [employer] to help with prosthesis if needed.   She also does not see a podiatrist; will refer her to a podiatrist in [facility] where she lives. She states that she does have transportation.   I advised her to take a daily 81 mg ASA uncles there is a good reason for her not to take, such as allergy, GI bleeding, etc.   Daily seated leg exercises for her left leg discussed. .  Over 3 minutes was spent counseling patient re smoking cessation, and patient was given several free resources re smoking cessation.    Follow Up Instructions:   Follow up in 3 months with left ABI. Will mail to her information re steps to quit smoking and peripheral artery occlusive disease   Signed, Rosalita Chessman Teresa Lemmerman [facility] Office: [phone]  12/30/2018, 1:52 PM

## 2018-12-30 NOTE — Therapy (Signed)
[facility] [address] [place], [place], [address] Phone: [phone]   Fax:  [phone]  Physical Therapy Treatment & Discharge Summary  Patient Details  Name: Emma [name] MRN: [id number] Date of Birth: 12-26-1945 Referring Provider (PT): Benito Mccreedy, MD   Encounter Date: 12/30/2018   PHYSICAL THERAPY DISCHARGE SUMMARY  Visits from Start of Care: 29  Current functional level related to goals / functional outcomes: See below   Remaining deficits: See below   Education / Equipment: Prosthetic care & HEP  Plan: Patient agrees to discharge.  Patient goals were not met. Patient is being discharged due to lack of progress.  ?????       PT End of Session - 12/30/18 1124    Visit Number  29    Number of Visits  38    Date for PT Re-Evaluation  01/03/19    Authorization Type  [employer]  Medicare - 10th visit PN required    PT Start Time  0955    PT Stop Time  1055    PT Time Calculation (min)  60 min    Equipment Utilized During Treatment  Other (comment)   BSC   Activity Tolerance  Patient tolerated treatment well    Behavior During Therapy  WFL for tasks assessed/performed       Past Medical History:  Diagnosis Date  . Arthritis    Back and right shoulder  . Constipation   . Diabetes mellitus    Type II  . Diabetes mellitus without complication ([place])   . Diabetic neuropathy ([place])   . History of blood transfusion   . Hypercholesteremia   . Hypertension   . Hypothyroidism   . Pneumonia   . Thyroid disease     Past Surgical History:  Procedure Laterality Date  . ABDOMINAL AORTOGRAM N/A 03/25/2018   Procedure: ABDOMINAL AORTOGRAM;  Surgeon: Waynetta Sandy, MD;  Location: [facility];  Service: Cardiovascular;  Laterality: N/A;  . ABDOMINAL HYSTERECTOMY    . AMPUTATION Right 03/29/2018   Procedure: AMPUTATION ABOVE KNEE;  Surgeon: Serafina Mitchell, MD;  Location: [facility];  Service: Vascular;  Laterality: Right;  . APPENDECTOMY    . BACK SURGERY    . BYPASS GRAFT FEMORAL-PERONEAL Right 03/26/2018   Procedure: BYPASS GRAFT [place] Stall;  Surgeon: Waynetta Sandy, MD;  Location: [facility];  Service: Vascular;  Laterality: Right;  . EXPLORATORY LAPAROTOMY     Adhesions removed and appendix  . FEMORAL-POPLITEAL BYPASS GRAFT Right 03/27/2018   Procedure: Right lower extremity angiogram, Right Femoral Peroneal artery bypass, Angioplasty Right Femoral Peroneal bypass, Thrombectomy of Femoral peroneal artery;  Surgeon: Waynetta Sandy, MD;  Location: [facility];  Service: Vascular;  Laterality: Right;  . INTRAOPERATIVE ARTERIOGRAM Right 03/26/2018   Procedure: Intra Operative Arteriogram;  Surgeon: Waynetta Sandy, MD;  Location: [facility];  Service: Vascular;  Laterality: Right;  . LOWER EXTREMITY ANGIOGRAPHY Bilateral 03/25/2018   Procedure: LOWER EXTREMITY ANGIOGRAPHY;  Surgeon: Waynetta Sandy, MD;  Location: [facility];  Service: Cardiovascular;  Laterality: Bilateral;  . LUMBAR LAMINECTOMY/DECOMPRESSION MICRODISCECTOMY N/A 09/09/2014   Procedure: L4-L5 DECOMPRESSION ;  Surgeon: Melina Schools, MD;  Location: [facility];  Service: Orthopedics;  Laterality: N/A;  . PATCH ANGIOPLASTY Right 03/26/2018   Procedure: PATCH ANGIOPLASTY;  Surgeon: Waynetta Sandy, MD;  Location: [facility];  Service: Vascular;  Laterality: Right;  . SHOULDER SURGERY    . THYROID SURGERY    .  TONSILLECTOMY    . TOTAL THYROIDECTOMY    . VEIN HARVEST Right 03/26/2018   Procedure: Vein Harvest of Right Leg Greater Saphenous Vein in SITU;  Surgeon: Waynetta Sandy, MD;  Location: [facility];  Service: Vascular;  Laterality: Right;    There were no vitals filed for this visit.  Subjective Assessment - 12/30/18 0949    Subjective  Dr. Vista Lawman called patient last week and he is supposed to try to call PT today. No falls. She has not worn prosthesis  since last PT session.    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  No/denies    Pain Onset  In the past 7 days          Prosthetics Assessment - 12/30/18 0955      Prosthetics   Prosthetic Care Dependent with  Skin check;Residual limb care;Prosthetic cleaning;Correct ply sock adjustment;Proper wear schedule/adjustment;Proper weight-bearing schedule/adjustment    Prosthetic Care Comments   PT instructed pt in manual lock knee on prosthesis and rationale why it may improve her safety.  Pt has appt with prosthetist on 12/18 already for pads in socket & alignment check and switching to manual lock. PT spoke with prosthetist who will assess her on Friday.   PT also intructed her in hip flexion contracture effecting "bone pain" that she reports.     Donning prosthesis   Supervision    Doffing prosthesis   Modified independent (Device/Increase time)    Current prosthetic weight-bearing tolerance (hours/day)   burning pain / intermittent claudication pain in LLE    Edema  Pitting edema                  [facility] Adult PT Treatment/Exercise - 12/30/18 0955      Transfers   Transfers  Sit to Stand;Stand to Sit    Sit to Stand  5: Supervision;With upper extremity assist;With armrests;From chair/3-in-1;4: Min assist   [place] chairs without armrests & SBA w/armrests to RW   Sit to Stand Details  --    Sit to Stand Details (indicate cue type and reason)  verbal cues on controlling prosthesis and placing in position of stability    Stand to Sit  5: Supervision;With upper extremity assist;With armrests;To chair/3-in-1;4: Min guard   SBA to chairs with armrests & Min guard without armrests   Stand to Sit Details (indicate cue type and reason)  --    Stand to Sit Details  verbal cues on prosthetic knee control       Ambulation/Gait   Ambulation/Gait  Yes    Ambulation/Gait Assistance  5: Supervision;4: Min assist   MinA for balance in turns around furniture   Ambulation Distance (Feet)  75 Feet    Assistive device  Rolling walker;Prosthesis    Gait Pattern  Step-to pattern;Decreased step length - left;Decreased stance time - right;Decreased hip/knee flexion - right;Decreased weight shift to right;Right steppage;Antalgic;Lateral hip instability;Trunk flexed    Ambulation Surface  Indoor;Level    Ramp  4: Min assist   RW & prosthesis   Ramp Details (indicate cue type and reason)  skilled instruction in prooper technique with TFA prosthesis    Curb  4: Min assist   RW & prosthesis   Curb Details (indicate cue type and reason)  skilled instruction in  prooper technique with TFA prosthesis      Self-Care   Self-Care  --    Other Self-Care Comments   --      Prosthetics   Current prosthetic wear tolerance (days/week)   Only when has PT over last few weeks.     Current prosthetic wear tolerance (#hours/day)   only during PT session over last few weeks as removes so she can toilet quicker when she gets home.      Residual limb condition   no new color changes or bruising noted, no new edema, increased tenderness distal femur & medial limb,      Education Provided  Correct ply sock adjustment;Proper Donning;Proper wear schedule/adjustment    Person(s) Educated  Patient    Education Method  Explanation;Verbal cues    Education Method  Verbalized understanding;Verbal cues required;Returned demonstration;Tactile cues required    Donning Prosthesis  Supervision    Doffing Prosthesis  Modified independent (device/increased time)               PT Short Term Goals - 12/09/18 1900      PT SHORT TERM GOAL #1   Title  Patient transfers stand pivot with RW between chair with armrests & chair without armrest using UEs with supervision. (ALL STGs updated for target date of 12/11/18)    Baseline  MET  12/09/2018    Time  4    Period  Weeks    Status  Achieved    Target Date  12/11/18      PT SHORT TERM GOAL #2   Title  Patient tolerates wearing prosthesis >/= 5 day/wk  >3hrs/day & liner >8hrs total /day without skin issues.    Baseline  Not MET 12/09/2018  Patient has become fearful with 2 falls in last few weeks. She also continues to report unable to toilet with prosthesis.    Time  4    Period  Weeks    Status  Not Met    Target Date  12/11/18      PT SHORT TERM GOAL #3   Title  Patient demonstrates ability to toilet with prosthesis including managing pants & sit/stand with supervision.    Baseline  NOT MET 12/09/2018  PT recommended placing BSC over toilet in guest bathroom which has more room but patient has not been able to have someone place BSC.    Time  4    Period  Weeks    Status  Not Met    Target Date  12/11/18      PT SHORT TERM GOAL #4   Title  Patient ambulates 100' with RW & prosthesis with supervision.    Baseline  MET 12/09/2018    Time  4    Period  Weeks    Status  Achieved    Target Date  12/11/18      PT SHORT TERM GOAL #5   Title  Standing balance with RW support reaches 5" anteriorly & pick up item from floor with supervision.    Baseline  NOT MET 12/09/2018    Time  4    Period  Weeks    Status  Not Met    Target Date  12/11/18      PT SHORT TERM GOAL #6   Title  Patient negotiates ramps & curbs with RW & prosthesis with minimal assist.    Baseline  MET 12/09/2018    Time  4    Period  Weeks  Status  Achieved    Target Date  12/11/18        PT Long Term Goals - 12/30/18 1229      PT LONG TERM GOAL #1   Title  Patient demonstrates & verbalizes proper prosthetic care to enable safe use of prosthesis.  (All LTGs Target Date: 01/03/2019)    Baseline  NOT MET 12/30/2018 Patient still requires skilled PT instruction to adjust ply socks and supervision to donne prosthesis safely.    Time  12    Period  Weeks    Status  Not Met      PT  LONG TERM GOAL #2   Title  Patient tolerates prosthesis wear >80% of awake hours to improve ability to function during her day.    Baseline  NOT MET 12/30/2018  Patient only has worn prosthesis in PT for last few weeks.    Time  12    Period  Weeks    Status  Not Met      PT LONG TERM GOAL #3   Title  Patient standing balance with RW support & prosthesis: static no UE support 1 minute, dynamic RW support scans, reaches 7" anteriorly & manages clothes modified independent.    Baseline  NOT MET 12/30/2018  Standing balance with RW support & TFA prosthesis: static 1 minute, dynamic scans with cervical & trunk motion with BUE, reaches 5" with either UE & contralateral UE suport on RW and manages pants with close supervision and PT cueing for positioning her prosthesis in stable position.    Time  12    Period  Weeks    Status  Not Met      PT LONG TERM GOAL #4   Title  Patient ambulates 200' with RW & prosthesis modifiied independent.    Baseline  NOT MET 12/30/2018  Patient ambulated 77' with RW & prosthesis with supervision & occasional minA in turns. She has ambulated up to 100' a few weeks ago but is inconsistent. She has intermittent claudication pain in LLE  limiting distance also.    Time  12    Period  Weeks    Status  On-going      PT LONG TERM GOAL #5   Title  Patient negotiates ramps & curbs with RW & prosthesis modified independent.    Baseline  NOT MET 12/30/2018  Patient negotiates ramps & curbs with RW & prosthesis with Minimal assist but constant verbal cues from PT.    Time  12    Period  Weeks    Status  Not Met            Plan - 12/30/18 1236    Clinical Impression Statement  Patient did not meet any of LTGs and has plateaued in her progress.  She does not wear or use prosthesis outside of PT. PT has problem solved thru all of her issues limiting her ability to use prosthesis outside of PT.  Patient would benefit from HHPT & a CNA to improve function within her home  with prosthesis first then possibly return to OPPT to work on [employer].  The hurdles with use of prosthesis at home appear to be initial issue that needs to be resolved.  PT also recommended putting manual lock knee on prosthesis to improve stability.    Personal Factors and Comorbidities  Age;Comorbidity 3+;Fitness;Past/Current Experience;Time since onset of injury/illness/exacerbation;Other;Transportation   lives alone so limited ability to practice tasks that require assist or supervision  Comorbidities  R TFA, L4-5 decompression sg 2016, DM, HTN,    Examination-Activity Limitations  Bend;Caring for Others;Carry;Locomotion Level;Reach Overhead;Stairs;Stand;Toileting;Transfers    Examination-Participation Restrictions  Community Activity;Church;Meal Prep    Stability/Clinical Decision Making  Evolving/Moderate complexity    Rehab Potential  Good    PT Frequency  2x / week    PT Duration  12 weeks    PT Treatment/Interventions  ADLs/Self Care Home Management;DME Instruction;Gait training;Stair training;Functional mobility training;Therapeutic activities;Therapeutic exercise;Balance training;Neuromuscular re-education;Patient/family education;Prosthetic Training;Vestibular;Passive range of motion;Manual techniques    PT Next Visit Plan  discharge due to lack of progress.    PT Home Exercise Plan  Medbridge Access Code: [email or web address]    Consulted and Agree with Plan of Care  Patient       Patient will benefit from skilled therapeutic intervention in order to improve the following deficits and impairments:  Abnormal gait, Decreased activity tolerance, Decreased balance, Decreased endurance, Decreased knowledge of use of DME, Decreased mobility, Decreased range of motion, Decreased strength, Dizziness, Increased edema, Impaired flexibility, Postural dysfunction, Prosthetic Dependency, Pain, Obesity  Visit Diagnosis: Other abnormalities of gait and mobility  Unsteadiness on feet  Abnormal  posture  Muscle weakness  Stiffness of right hip, not elsewhere classified     Problem List Patient Active Problem List   Diagnosis Date Noted  . PAD (peripheral artery disease) ([place]) 03/23/2018  . Back pain 09/09/2014  . HYPERLIPIDEMIA 12/18/2006  . RECTAL POLYPS 12/18/2006  . 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 SBO 08/01/2006  . GOITER NOS 03/11/2006  . PERIPHERAL NEUROPATHY 03/11/2006  . HYPERTENSION 03/11/2006  . OSTEOPENIA 03/11/2006  . FOOT DROP 03/11/2006    Jamey Reas PT, DPT 12/30/2018, 12:41 PM  [facility] [address] [place], [place], [address] Phone: [phone]   Fax:  [phone]  Name: Emma [name] MRN: [id number] Date of Birth: 1945-11-24

## 2018-12-30 NOTE — Telephone Encounter (Signed)
Virtual Visit Pre-Appointment Phone Call  Today, I spoke with [facility] and performed the following actions:  1. I explained that we are currently trying to limit exposure to the COVID-19 virus by seeing patients at home rather than in the office.  I explained that the visits are best done by video, but can be done by telephone.  I asked the patient if a virtual visit that the patient would like to try instead of coming into the office. Jetaun Spooner agreed to proceed with the virtual visit scheduled with Rosalita Chessman Nickel on 12/30/18.        2. I confirmed the BEST phone number to call the day of the visit and- I included this in appointment notes.  3. I asked if the patient had access to (through a family member/friend) a smartphone with video capability to be used for her visit?"  The patient said yes -   4. I confirmed consent by  a. sending through MyChart or by email the FULL LENGTH CONSENT FOR TELE-HEALTH VISIT as written at the end of this message or  b. verbally as listed below. i. This visit is being performed in the setting of COVID-19. ii. All virtual visits are billed to your insurance company just like a normal visit would be.   iii. We'd like you to understand that the technology does not allow for your provider to perform an examination, and thus may limit your provider's ability to fully assess your condition.  iv. If your provider identifies any concerns that need to be evaluated in person, we will make arrangements to do so.   v. Finally, though the technology is pretty good, we cannot assure that it will always work on either your or our end, and in the setting of a video visit, we may have to convert it to a phone-only visit.  In either situation, we cannot ensure that we have a secure connection.   vi. Are you willing to proceed?"  STAFF: Did the patient verbally acknowledge consent to telehealth visit? Document YES/NO here: YES  2. I advised the patient  to be prepared - I asked that the patient, on the day of her visit, record any information possible with the equipment at her home, such as blood pressure, pulse, oxygen saturation, and your weight and write them all down. I asked the patient to have a pen and paper handy nearby the day of the visit as well.  3. If the patient was scheduled for a video visit, I informed the patient that the visit with the doctor would start with a text to the smartphone # given to [place] by the patient.         If the patient was scheduled for a telephone call, I informed the patient that the visit with the doctor would start with a call to the telephone # given to [place] by the patient.  4. I Informed patient they will receive a phone call 15 minutes prior to their appointment time from a CMA or nurse to review medications, allergies, etc. to prepare for the visit.    TELEPHONE CALL NOTE  Hatley Stan has been deemed a candidate for a follow-up tele-health visit to limit community exposure during the Covid-19 pandemic. I spoke with the patient via phone to ensure availability of phone/video source, confirm preferred email & phone number, and discuss instructions and expectations.  I reminded Jolinda Gugliotta to be prepared with any vital sign and/or heart rhythm  information that could potentially be obtained via home monitoring, at the time of her visit. I reminded Devynne Sturdivant to expect a phone call prior to her visit.  Cleaster Corin, NT 12/30/2018 1:19 PM     FULL LENGTH CONSENT FOR TELE-HEALTH VISIT   I hereby voluntarily request, consent and authorize [facility] and its employed or contracted physicians, physician assistants, nurse practitioners or other licensed health care professionals (the Practitioner), to provide me with telemedicine health care services (the "Services") as deemed necessary by the treating Practitioner. I acknowledge and consent to receive the Services by the Practitioner  via telemedicine. I understand that the telemedicine visit will involve communicating with the Practitioner through live audiovisual communication technology and the disclosure of certain medical information by electronic transmission. I acknowledge that I have been given the opportunity to request an in-person assessment or other available alternative prior to the telemedicine visit and am voluntarily participating in the telemedicine visit.  I understand that I have the right to withhold or withdraw my consent to the use of telemedicine in the course of my care at any time, without affecting my right to future care or treatment, and that the Practitioner or I may terminate the telemedicine visit at any time. I understand that I have the right to inspect all information obtained and/or recorded in the course of the telemedicine visit and may receive copies of available information for a reasonable fee.  I understand that some of the potential risks of receiving the Services via telemedicine include:  Marland Kitchen Delay or interruption in medical evaluation due to technological equipment failure or disruption; . Information transmitted may not be sufficient (e.g. poor resolution of images) to allow for appropriate medical decision making by the Practitioner; and/or  . In rare instances, security protocols could fail, causing a breach of personal health information.  Furthermore, I acknowledge that it is my responsibility to provide information about my medical history, conditions and care that is complete and accurate to the best of my ability. I acknowledge that Practitioner's advice, recommendations, and/or decision may be based on factors not within their control, such as incomplete or inaccurate data provided by me or distortions of diagnostic images or specimens that may result from electronic transmissions. I understand that the practice of medicine is not an exact science and that Practitioner makes no warranties  or guarantees regarding treatment outcomes. I acknowledge that I will receive a copy of this consent concurrently upon execution via email to the email address I last provided but may also request a printed copy by calling the office of [facility].    I understand that my insurance will be billed for this visit.   I have read or had this consent read to me. . I understand the contents of this consent, which adequately explains the benefits and risks of the Services being provided via telemedicine.  . I have been provided ample opportunity to ask questions regarding this consent and the Services and have had my questions answered to my satisfaction. . I give my informed consent for the services to be provided through the use of telemedicine in my medical care  By participating in this telemedicine visit I agree to the above.

## 2019-01-01 ENCOUNTER — Ambulatory Visit: Payer: Medicare HMO

## 2019-01-01 ENCOUNTER — Encounter: Payer: Medicare HMO | Admitting: Physical Therapy

## 2019-01-01 NOTE — Addendum Note (Signed)
Addended by: Viann Fish on: 01/01/2019 04:44 PM   Modules accepted: Level of Service

## 2019-01-02 ENCOUNTER — Encounter: Payer: Medicare HMO | Admitting: Physical Therapy

## 2019-01-03 DIAGNOSIS — R6889 Other general symptoms and signs: Secondary | ICD-10-CM | POA: Diagnosis not present

## 2019-01-08 ENCOUNTER — Other Ambulatory Visit: Payer: Self-pay | Admitting: Family

## 2019-01-08 DIAGNOSIS — I779 Disorder of arteries and arterioles, unspecified: Secondary | ICD-10-CM

## 2019-01-08 DIAGNOSIS — Z89611 Acquired absence of right leg above knee: Secondary | ICD-10-CM

## 2019-01-13 ENCOUNTER — Telehealth: Payer: Self-pay | Admitting: Vascular Surgery

## 2019-01-13 DIAGNOSIS — I10 Essential (primary) hypertension: Secondary | ICD-10-CM | POA: Diagnosis not present

## 2019-01-13 DIAGNOSIS — E1140 Type 2 diabetes mellitus with diabetic neuropathy, unspecified: Secondary | ICD-10-CM | POA: Diagnosis not present

## 2019-01-13 DIAGNOSIS — E1151 Type 2 diabetes mellitus with diabetic peripheral angiopathy without gangrene: Secondary | ICD-10-CM | POA: Diagnosis not present

## 2019-01-13 DIAGNOSIS — E7800 Pure hypercholesterolemia, unspecified: Secondary | ICD-10-CM | POA: Diagnosis not present

## 2019-01-13 DIAGNOSIS — M19011 Primary osteoarthritis, right shoulder: Secondary | ICD-10-CM | POA: Diagnosis not present

## 2019-01-13 DIAGNOSIS — F17210 Nicotine dependence, cigarettes, uncomplicated: Secondary | ICD-10-CM | POA: Diagnosis not present

## 2019-01-13 DIAGNOSIS — K5900 Constipation, unspecified: Secondary | ICD-10-CM | POA: Diagnosis not present

## 2019-01-13 DIAGNOSIS — M47819 Spondylosis without myelopathy or radiculopathy, site unspecified: Secondary | ICD-10-CM | POA: Diagnosis not present

## 2019-01-13 DIAGNOSIS — I70209 Unspecified atherosclerosis of native arteries of extremities, unspecified extremity: Secondary | ICD-10-CM | POA: Diagnosis not present

## 2019-01-13 NOTE — Telephone Encounter (Signed)
Erich Montane, PT with [facility] called to advise [place] they will take Ms. [place]burg on as a patient.  Thurston Hole., LPN

## 2019-01-15 DIAGNOSIS — F17210 Nicotine dependence, cigarettes, uncomplicated: Secondary | ICD-10-CM | POA: Diagnosis not present

## 2019-01-15 DIAGNOSIS — K5900 Constipation, unspecified: Secondary | ICD-10-CM | POA: Diagnosis not present

## 2019-01-15 DIAGNOSIS — E7800 Pure hypercholesterolemia, unspecified: Secondary | ICD-10-CM | POA: Diagnosis not present

## 2019-01-15 DIAGNOSIS — E1140 Type 2 diabetes mellitus with diabetic neuropathy, unspecified: Secondary | ICD-10-CM | POA: Diagnosis not present

## 2019-01-15 DIAGNOSIS — E1151 Type 2 diabetes mellitus with diabetic peripheral angiopathy without gangrene: Secondary | ICD-10-CM | POA: Diagnosis not present

## 2019-01-15 DIAGNOSIS — I10 Essential (primary) hypertension: Secondary | ICD-10-CM | POA: Diagnosis not present

## 2019-01-15 DIAGNOSIS — I70209 Unspecified atherosclerosis of native arteries of extremities, unspecified extremity: Secondary | ICD-10-CM | POA: Diagnosis not present

## 2019-01-15 DIAGNOSIS — M19011 Primary osteoarthritis, right shoulder: Secondary | ICD-10-CM | POA: Diagnosis not present

## 2019-01-15 DIAGNOSIS — M47819 Spondylosis without myelopathy or radiculopathy, site unspecified: Secondary | ICD-10-CM | POA: Diagnosis not present

## 2019-01-16 ENCOUNTER — Other Ambulatory Visit: Payer: Self-pay | Admitting: *Deleted

## 2019-01-16 DIAGNOSIS — I739 Peripheral vascular disease, unspecified: Secondary | ICD-10-CM

## 2019-01-20 DIAGNOSIS — I70209 Unspecified atherosclerosis of native arteries of extremities, unspecified extremity: Secondary | ICD-10-CM | POA: Diagnosis not present

## 2019-01-20 DIAGNOSIS — K5900 Constipation, unspecified: Secondary | ICD-10-CM | POA: Diagnosis not present

## 2019-01-20 DIAGNOSIS — F17210 Nicotine dependence, cigarettes, uncomplicated: Secondary | ICD-10-CM | POA: Diagnosis not present

## 2019-01-20 DIAGNOSIS — E1140 Type 2 diabetes mellitus with diabetic neuropathy, unspecified: Secondary | ICD-10-CM | POA: Diagnosis not present

## 2019-01-20 DIAGNOSIS — M47819 Spondylosis without myelopathy or radiculopathy, site unspecified: Secondary | ICD-10-CM | POA: Diagnosis not present

## 2019-01-20 DIAGNOSIS — E7800 Pure hypercholesterolemia, unspecified: Secondary | ICD-10-CM | POA: Diagnosis not present

## 2019-01-20 DIAGNOSIS — E1151 Type 2 diabetes mellitus with diabetic peripheral angiopathy without gangrene: Secondary | ICD-10-CM | POA: Diagnosis not present

## 2019-01-20 DIAGNOSIS — M19011 Primary osteoarthritis, right shoulder: Secondary | ICD-10-CM | POA: Diagnosis not present

## 2019-01-20 DIAGNOSIS — I10 Essential (primary) hypertension: Secondary | ICD-10-CM | POA: Diagnosis not present

## 2019-01-22 DIAGNOSIS — E7800 Pure hypercholesterolemia, unspecified: Secondary | ICD-10-CM | POA: Diagnosis not present

## 2019-01-22 DIAGNOSIS — E1151 Type 2 diabetes mellitus with diabetic peripheral angiopathy without gangrene: Secondary | ICD-10-CM | POA: Diagnosis not present

## 2019-01-22 DIAGNOSIS — F17210 Nicotine dependence, cigarettes, uncomplicated: Secondary | ICD-10-CM | POA: Diagnosis not present

## 2019-01-22 DIAGNOSIS — I70209 Unspecified atherosclerosis of native arteries of extremities, unspecified extremity: Secondary | ICD-10-CM | POA: Diagnosis not present

## 2019-01-22 DIAGNOSIS — M47819 Spondylosis without myelopathy or radiculopathy, site unspecified: Secondary | ICD-10-CM | POA: Diagnosis not present

## 2019-01-22 DIAGNOSIS — M19011 Primary osteoarthritis, right shoulder: Secondary | ICD-10-CM | POA: Diagnosis not present

## 2019-01-22 DIAGNOSIS — K5900 Constipation, unspecified: Secondary | ICD-10-CM | POA: Diagnosis not present

## 2019-01-22 DIAGNOSIS — E1140 Type 2 diabetes mellitus with diabetic neuropathy, unspecified: Secondary | ICD-10-CM | POA: Diagnosis not present

## 2019-01-22 DIAGNOSIS — I10 Essential (primary) hypertension: Secondary | ICD-10-CM | POA: Diagnosis not present

## 2019-01-23 DIAGNOSIS — Z89611 Acquired absence of right leg above knee: Secondary | ICD-10-CM | POA: Diagnosis not present

## 2019-01-23 DIAGNOSIS — E119 Type 2 diabetes mellitus without complications: Secondary | ICD-10-CM | POA: Diagnosis not present

## 2019-01-23 DIAGNOSIS — I739 Peripheral vascular disease, unspecified: Secondary | ICD-10-CM | POA: Diagnosis not present

## 2019-01-27 DIAGNOSIS — I70209 Unspecified atherosclerosis of native arteries of extremities, unspecified extremity: Secondary | ICD-10-CM | POA: Diagnosis not present

## 2019-01-27 DIAGNOSIS — M19011 Primary osteoarthritis, right shoulder: Secondary | ICD-10-CM | POA: Diagnosis not present

## 2019-01-27 DIAGNOSIS — E1151 Type 2 diabetes mellitus with diabetic peripheral angiopathy without gangrene: Secondary | ICD-10-CM | POA: Diagnosis not present

## 2019-01-27 DIAGNOSIS — K5900 Constipation, unspecified: Secondary | ICD-10-CM | POA: Diagnosis not present

## 2019-01-27 DIAGNOSIS — I10 Essential (primary) hypertension: Secondary | ICD-10-CM | POA: Diagnosis not present

## 2019-01-27 DIAGNOSIS — M47819 Spondylosis without myelopathy or radiculopathy, site unspecified: Secondary | ICD-10-CM | POA: Diagnosis not present

## 2019-01-27 DIAGNOSIS — E1140 Type 2 diabetes mellitus with diabetic neuropathy, unspecified: Secondary | ICD-10-CM | POA: Diagnosis not present

## 2019-01-27 DIAGNOSIS — F17210 Nicotine dependence, cigarettes, uncomplicated: Secondary | ICD-10-CM | POA: Diagnosis not present

## 2019-01-27 DIAGNOSIS — E7800 Pure hypercholesterolemia, unspecified: Secondary | ICD-10-CM | POA: Diagnosis not present

## 2019-01-29 DIAGNOSIS — E1140 Type 2 diabetes mellitus with diabetic neuropathy, unspecified: Secondary | ICD-10-CM | POA: Diagnosis not present

## 2019-01-29 DIAGNOSIS — E1151 Type 2 diabetes mellitus with diabetic peripheral angiopathy without gangrene: Secondary | ICD-10-CM | POA: Diagnosis not present

## 2019-01-29 DIAGNOSIS — F17210 Nicotine dependence, cigarettes, uncomplicated: Secondary | ICD-10-CM | POA: Diagnosis not present

## 2019-01-29 DIAGNOSIS — K5900 Constipation, unspecified: Secondary | ICD-10-CM | POA: Diagnosis not present

## 2019-01-29 DIAGNOSIS — I10 Essential (primary) hypertension: Secondary | ICD-10-CM | POA: Diagnosis not present

## 2019-01-29 DIAGNOSIS — M19011 Primary osteoarthritis, right shoulder: Secondary | ICD-10-CM | POA: Diagnosis not present

## 2019-01-29 DIAGNOSIS — E7800 Pure hypercholesterolemia, unspecified: Secondary | ICD-10-CM | POA: Diagnosis not present

## 2019-01-29 DIAGNOSIS — M47819 Spondylosis without myelopathy or radiculopathy, site unspecified: Secondary | ICD-10-CM | POA: Diagnosis not present

## 2019-01-29 DIAGNOSIS — I70209 Unspecified atherosclerosis of native arteries of extremities, unspecified extremity: Secondary | ICD-10-CM | POA: Diagnosis not present

## 2019-02-03 DIAGNOSIS — E1151 Type 2 diabetes mellitus with diabetic peripheral angiopathy without gangrene: Secondary | ICD-10-CM | POA: Diagnosis not present

## 2019-02-03 DIAGNOSIS — I70209 Unspecified atherosclerosis of native arteries of extremities, unspecified extremity: Secondary | ICD-10-CM | POA: Diagnosis not present

## 2019-02-03 DIAGNOSIS — M19011 Primary osteoarthritis, right shoulder: Secondary | ICD-10-CM | POA: Diagnosis not present

## 2019-02-03 DIAGNOSIS — E7800 Pure hypercholesterolemia, unspecified: Secondary | ICD-10-CM | POA: Diagnosis not present

## 2019-02-03 DIAGNOSIS — E1140 Type 2 diabetes mellitus with diabetic neuropathy, unspecified: Secondary | ICD-10-CM | POA: Diagnosis not present

## 2019-02-03 DIAGNOSIS — F17210 Nicotine dependence, cigarettes, uncomplicated: Secondary | ICD-10-CM | POA: Diagnosis not present

## 2019-02-03 DIAGNOSIS — M47819 Spondylosis without myelopathy or radiculopathy, site unspecified: Secondary | ICD-10-CM | POA: Diagnosis not present

## 2019-02-03 DIAGNOSIS — K5900 Constipation, unspecified: Secondary | ICD-10-CM | POA: Diagnosis not present

## 2019-02-03 DIAGNOSIS — I10 Essential (primary) hypertension: Secondary | ICD-10-CM | POA: Diagnosis not present

## 2019-02-05 DIAGNOSIS — E1151 Type 2 diabetes mellitus with diabetic peripheral angiopathy without gangrene: Secondary | ICD-10-CM | POA: Diagnosis not present

## 2019-02-05 DIAGNOSIS — M47819 Spondylosis without myelopathy or radiculopathy, site unspecified: Secondary | ICD-10-CM | POA: Diagnosis not present

## 2019-02-05 DIAGNOSIS — I10 Essential (primary) hypertension: Secondary | ICD-10-CM | POA: Diagnosis not present

## 2019-02-05 DIAGNOSIS — E1140 Type 2 diabetes mellitus with diabetic neuropathy, unspecified: Secondary | ICD-10-CM | POA: Diagnosis not present

## 2019-02-05 DIAGNOSIS — M19011 Primary osteoarthritis, right shoulder: Secondary | ICD-10-CM | POA: Diagnosis not present

## 2019-02-05 DIAGNOSIS — F17210 Nicotine dependence, cigarettes, uncomplicated: Secondary | ICD-10-CM | POA: Diagnosis not present

## 2019-02-05 DIAGNOSIS — K5900 Constipation, unspecified: Secondary | ICD-10-CM | POA: Diagnosis not present

## 2019-02-05 DIAGNOSIS — E7800 Pure hypercholesterolemia, unspecified: Secondary | ICD-10-CM | POA: Diagnosis not present

## 2019-02-05 DIAGNOSIS — I70209 Unspecified atherosclerosis of native arteries of extremities, unspecified extremity: Secondary | ICD-10-CM | POA: Diagnosis not present

## 2019-02-10 DIAGNOSIS — I70209 Unspecified atherosclerosis of native arteries of extremities, unspecified extremity: Secondary | ICD-10-CM | POA: Diagnosis not present

## 2019-02-10 DIAGNOSIS — M19011 Primary osteoarthritis, right shoulder: Secondary | ICD-10-CM | POA: Diagnosis not present

## 2019-02-10 DIAGNOSIS — E1140 Type 2 diabetes mellitus with diabetic neuropathy, unspecified: Secondary | ICD-10-CM | POA: Diagnosis not present

## 2019-02-10 DIAGNOSIS — I10 Essential (primary) hypertension: Secondary | ICD-10-CM | POA: Diagnosis not present

## 2019-02-10 DIAGNOSIS — E7800 Pure hypercholesterolemia, unspecified: Secondary | ICD-10-CM | POA: Diagnosis not present

## 2019-02-10 DIAGNOSIS — M47819 Spondylosis without myelopathy or radiculopathy, site unspecified: Secondary | ICD-10-CM | POA: Diagnosis not present

## 2019-02-10 DIAGNOSIS — E1151 Type 2 diabetes mellitus with diabetic peripheral angiopathy without gangrene: Secondary | ICD-10-CM | POA: Diagnosis not present

## 2019-02-10 DIAGNOSIS — F17210 Nicotine dependence, cigarettes, uncomplicated: Secondary | ICD-10-CM | POA: Diagnosis not present

## 2019-02-10 DIAGNOSIS — K5900 Constipation, unspecified: Secondary | ICD-10-CM | POA: Diagnosis not present

## 2019-02-12 DIAGNOSIS — M47819 Spondylosis without myelopathy or radiculopathy, site unspecified: Secondary | ICD-10-CM | POA: Diagnosis not present

## 2019-02-12 DIAGNOSIS — I10 Essential (primary) hypertension: Secondary | ICD-10-CM | POA: Diagnosis not present

## 2019-02-12 DIAGNOSIS — M19011 Primary osteoarthritis, right shoulder: Secondary | ICD-10-CM | POA: Diagnosis not present

## 2019-02-12 DIAGNOSIS — K5900 Constipation, unspecified: Secondary | ICD-10-CM | POA: Diagnosis not present

## 2019-02-12 DIAGNOSIS — E1140 Type 2 diabetes mellitus with diabetic neuropathy, unspecified: Secondary | ICD-10-CM | POA: Diagnosis not present

## 2019-02-12 DIAGNOSIS — F17210 Nicotine dependence, cigarettes, uncomplicated: Secondary | ICD-10-CM | POA: Diagnosis not present

## 2019-02-12 DIAGNOSIS — E1151 Type 2 diabetes mellitus with diabetic peripheral angiopathy without gangrene: Secondary | ICD-10-CM | POA: Diagnosis not present

## 2019-02-12 DIAGNOSIS — E7800 Pure hypercholesterolemia, unspecified: Secondary | ICD-10-CM | POA: Diagnosis not present

## 2019-02-12 DIAGNOSIS — I70209 Unspecified atherosclerosis of native arteries of extremities, unspecified extremity: Secondary | ICD-10-CM | POA: Diagnosis not present

## 2019-02-17 DIAGNOSIS — I10 Essential (primary) hypertension: Secondary | ICD-10-CM | POA: Diagnosis not present

## 2019-02-17 DIAGNOSIS — I82409 Acute embolism and thrombosis of unspecified deep veins of unspecified lower extremity: Secondary | ICD-10-CM

## 2019-02-17 DIAGNOSIS — E1140 Type 2 diabetes mellitus with diabetic neuropathy, unspecified: Secondary | ICD-10-CM | POA: Diagnosis not present

## 2019-02-17 DIAGNOSIS — I70209 Unspecified atherosclerosis of native arteries of extremities, unspecified extremity: Secondary | ICD-10-CM | POA: Diagnosis not present

## 2019-02-17 DIAGNOSIS — M47819 Spondylosis without myelopathy or radiculopathy, site unspecified: Secondary | ICD-10-CM | POA: Diagnosis not present

## 2019-02-17 DIAGNOSIS — I639 Cerebral infarction, unspecified: Secondary | ICD-10-CM

## 2019-02-17 DIAGNOSIS — K5900 Constipation, unspecified: Secondary | ICD-10-CM | POA: Diagnosis not present

## 2019-02-17 DIAGNOSIS — E7800 Pure hypercholesterolemia, unspecified: Secondary | ICD-10-CM | POA: Diagnosis not present

## 2019-02-17 DIAGNOSIS — F17210 Nicotine dependence, cigarettes, uncomplicated: Secondary | ICD-10-CM | POA: Diagnosis not present

## 2019-02-17 DIAGNOSIS — M19011 Primary osteoarthritis, right shoulder: Secondary | ICD-10-CM | POA: Diagnosis not present

## 2019-02-17 DIAGNOSIS — E1151 Type 2 diabetes mellitus with diabetic peripheral angiopathy without gangrene: Secondary | ICD-10-CM | POA: Diagnosis not present

## 2019-02-17 HISTORY — DX: Acute embolism and thrombosis of unspecified deep veins of unspecified lower extremity: I82.409

## 2019-02-17 HISTORY — DX: Cerebral infarction, unspecified: I63.9

## 2019-02-24 DIAGNOSIS — Z789 Other specified health status: Secondary | ICD-10-CM | POA: Diagnosis not present

## 2019-02-24 DIAGNOSIS — Z0001 Encounter for general adult medical examination with abnormal findings: Secondary | ICD-10-CM | POA: Diagnosis not present

## 2019-02-24 DIAGNOSIS — E1142 Type 2 diabetes mellitus with diabetic polyneuropathy: Secondary | ICD-10-CM | POA: Diagnosis not present

## 2019-02-24 DIAGNOSIS — I10 Essential (primary) hypertension: Secondary | ICD-10-CM | POA: Diagnosis not present

## 2019-02-24 DIAGNOSIS — R6889 Other general symptoms and signs: Secondary | ICD-10-CM | POA: Diagnosis not present

## 2019-02-24 DIAGNOSIS — E7800 Pure hypercholesterolemia, unspecified: Secondary | ICD-10-CM | POA: Diagnosis not present

## 2019-02-24 DIAGNOSIS — I952 Hypotension due to drugs: Secondary | ICD-10-CM | POA: Diagnosis not present

## 2019-02-24 DIAGNOSIS — E039 Hypothyroidism, unspecified: Secondary | ICD-10-CM | POA: Diagnosis not present

## 2019-02-24 DIAGNOSIS — E1165 Type 2 diabetes mellitus with hyperglycemia: Secondary | ICD-10-CM | POA: Diagnosis not present

## 2019-02-25 DIAGNOSIS — I10 Essential (primary) hypertension: Secondary | ICD-10-CM | POA: Diagnosis not present

## 2019-02-25 DIAGNOSIS — M19011 Primary osteoarthritis, right shoulder: Secondary | ICD-10-CM | POA: Diagnosis not present

## 2019-02-25 DIAGNOSIS — F17210 Nicotine dependence, cigarettes, uncomplicated: Secondary | ICD-10-CM | POA: Diagnosis not present

## 2019-02-25 DIAGNOSIS — I70209 Unspecified atherosclerosis of native arteries of extremities, unspecified extremity: Secondary | ICD-10-CM | POA: Diagnosis not present

## 2019-02-25 DIAGNOSIS — E7800 Pure hypercholesterolemia, unspecified: Secondary | ICD-10-CM | POA: Diagnosis not present

## 2019-02-25 DIAGNOSIS — K5900 Constipation, unspecified: Secondary | ICD-10-CM | POA: Diagnosis not present

## 2019-02-25 DIAGNOSIS — E1140 Type 2 diabetes mellitus with diabetic neuropathy, unspecified: Secondary | ICD-10-CM | POA: Diagnosis not present

## 2019-02-25 DIAGNOSIS — M47819 Spondylosis without myelopathy or radiculopathy, site unspecified: Secondary | ICD-10-CM | POA: Diagnosis not present

## 2019-02-25 DIAGNOSIS — E1151 Type 2 diabetes mellitus with diabetic peripheral angiopathy without gangrene: Secondary | ICD-10-CM | POA: Diagnosis not present

## 2019-02-27 DIAGNOSIS — M19011 Primary osteoarthritis, right shoulder: Secondary | ICD-10-CM | POA: Diagnosis not present

## 2019-02-27 DIAGNOSIS — I10 Essential (primary) hypertension: Secondary | ICD-10-CM | POA: Diagnosis not present

## 2019-02-27 DIAGNOSIS — E7800 Pure hypercholesterolemia, unspecified: Secondary | ICD-10-CM | POA: Diagnosis not present

## 2019-02-27 DIAGNOSIS — E1151 Type 2 diabetes mellitus with diabetic peripheral angiopathy without gangrene: Secondary | ICD-10-CM | POA: Diagnosis not present

## 2019-02-27 DIAGNOSIS — E1140 Type 2 diabetes mellitus with diabetic neuropathy, unspecified: Secondary | ICD-10-CM | POA: Diagnosis not present

## 2019-02-27 DIAGNOSIS — I70209 Unspecified atherosclerosis of native arteries of extremities, unspecified extremity: Secondary | ICD-10-CM | POA: Diagnosis not present

## 2019-02-27 DIAGNOSIS — K5900 Constipation, unspecified: Secondary | ICD-10-CM | POA: Diagnosis not present

## 2019-02-27 DIAGNOSIS — M47819 Spondylosis without myelopathy or radiculopathy, site unspecified: Secondary | ICD-10-CM | POA: Diagnosis not present

## 2019-02-27 DIAGNOSIS — F17210 Nicotine dependence, cigarettes, uncomplicated: Secondary | ICD-10-CM | POA: Diagnosis not present

## 2019-03-03 DIAGNOSIS — F17210 Nicotine dependence, cigarettes, uncomplicated: Secondary | ICD-10-CM | POA: Diagnosis not present

## 2019-03-03 DIAGNOSIS — M19011 Primary osteoarthritis, right shoulder: Secondary | ICD-10-CM | POA: Diagnosis not present

## 2019-03-03 DIAGNOSIS — M47819 Spondylosis without myelopathy or radiculopathy, site unspecified: Secondary | ICD-10-CM | POA: Diagnosis not present

## 2019-03-03 DIAGNOSIS — I10 Essential (primary) hypertension: Secondary | ICD-10-CM | POA: Diagnosis not present

## 2019-03-03 DIAGNOSIS — E1140 Type 2 diabetes mellitus with diabetic neuropathy, unspecified: Secondary | ICD-10-CM | POA: Diagnosis not present

## 2019-03-03 DIAGNOSIS — K5900 Constipation, unspecified: Secondary | ICD-10-CM | POA: Diagnosis not present

## 2019-03-03 DIAGNOSIS — E7800 Pure hypercholesterolemia, unspecified: Secondary | ICD-10-CM | POA: Diagnosis not present

## 2019-03-03 DIAGNOSIS — I70209 Unspecified atherosclerosis of native arteries of extremities, unspecified extremity: Secondary | ICD-10-CM | POA: Diagnosis not present

## 2019-03-03 DIAGNOSIS — E1151 Type 2 diabetes mellitus with diabetic peripheral angiopathy without gangrene: Secondary | ICD-10-CM | POA: Diagnosis not present

## 2019-03-10 DIAGNOSIS — E1165 Type 2 diabetes mellitus with hyperglycemia: Secondary | ICD-10-CM | POA: Diagnosis not present

## 2019-03-10 DIAGNOSIS — K5900 Constipation, unspecified: Secondary | ICD-10-CM | POA: Diagnosis not present

## 2019-03-10 DIAGNOSIS — R6889 Other general symptoms and signs: Secondary | ICD-10-CM | POA: Diagnosis not present

## 2019-03-10 DIAGNOSIS — E1142 Type 2 diabetes mellitus with diabetic polyneuropathy: Secondary | ICD-10-CM | POA: Diagnosis not present

## 2019-03-10 DIAGNOSIS — M47819 Spondylosis without myelopathy or radiculopathy, site unspecified: Secondary | ICD-10-CM | POA: Diagnosis not present

## 2019-03-10 DIAGNOSIS — E1140 Type 2 diabetes mellitus with diabetic neuropathy, unspecified: Secondary | ICD-10-CM | POA: Diagnosis not present

## 2019-03-10 DIAGNOSIS — F17210 Nicotine dependence, cigarettes, uncomplicated: Secondary | ICD-10-CM | POA: Diagnosis not present

## 2019-03-10 DIAGNOSIS — I70209 Unspecified atherosclerosis of native arteries of extremities, unspecified extremity: Secondary | ICD-10-CM | POA: Diagnosis not present

## 2019-03-10 DIAGNOSIS — E1151 Type 2 diabetes mellitus with diabetic peripheral angiopathy without gangrene: Secondary | ICD-10-CM | POA: Diagnosis not present

## 2019-03-10 DIAGNOSIS — M19011 Primary osteoarthritis, right shoulder: Secondary | ICD-10-CM | POA: Diagnosis not present

## 2019-03-10 DIAGNOSIS — E039 Hypothyroidism, unspecified: Secondary | ICD-10-CM | POA: Diagnosis not present

## 2019-03-10 DIAGNOSIS — I10 Essential (primary) hypertension: Secondary | ICD-10-CM | POA: Diagnosis not present

## 2019-03-10 DIAGNOSIS — E7800 Pure hypercholesterolemia, unspecified: Secondary | ICD-10-CM | POA: Diagnosis not present

## 2019-03-13 ENCOUNTER — Emergency Department (HOSPITAL_COMMUNITY): Payer: Medicare HMO

## 2019-03-13 ENCOUNTER — Other Ambulatory Visit: Payer: Self-pay

## 2019-03-13 ENCOUNTER — Inpatient Hospital Stay (HOSPITAL_COMMUNITY): Payer: Medicare HMO

## 2019-03-13 ENCOUNTER — Encounter (HOSPITAL_COMMUNITY): Payer: Self-pay | Admitting: Internal Medicine

## 2019-03-13 ENCOUNTER — Inpatient Hospital Stay (HOSPITAL_COMMUNITY)
Admission: EM | Admit: 2019-03-13 | Discharge: 2019-03-20 | DRG: 065 | Disposition: A | Discharge status: Skilled Nursing Facility | Payer: Medicare HMO | Attending: Internal Medicine | Admitting: Internal Medicine

## 2019-03-13 DIAGNOSIS — Z7982 Long term (current) use of aspirin: Secondary | ICD-10-CM | POA: Diagnosis not present

## 2019-03-13 DIAGNOSIS — I69391 Dysphagia following cerebral infarction: Secondary | ICD-10-CM | POA: Diagnosis not present

## 2019-03-13 DIAGNOSIS — I69351 Hemiplegia and hemiparesis following cerebral infarction affecting right dominant side: Secondary | ICD-10-CM | POA: Diagnosis not present

## 2019-03-13 DIAGNOSIS — Z993 Dependence on wheelchair: Secondary | ICD-10-CM

## 2019-03-13 DIAGNOSIS — I69320 Aphasia following cerebral infarction: Secondary | ICD-10-CM | POA: Diagnosis not present

## 2019-03-13 DIAGNOSIS — R4781 Slurred speech: Secondary | ICD-10-CM | POA: Diagnosis not present

## 2019-03-13 DIAGNOSIS — E1151 Type 2 diabetes mellitus with diabetic peripheral angiopathy without gangrene: Secondary | ICD-10-CM | POA: Diagnosis not present

## 2019-03-13 DIAGNOSIS — R0902 Hypoxemia: Secondary | ICD-10-CM | POA: Diagnosis not present

## 2019-03-13 DIAGNOSIS — G8191 Hemiplegia, unspecified affecting right dominant side: Secondary | ICD-10-CM | POA: Diagnosis present

## 2019-03-13 DIAGNOSIS — M2550 Pain in unspecified joint: Secondary | ICD-10-CM | POA: Diagnosis not present

## 2019-03-13 DIAGNOSIS — F17210 Nicotine dependence, cigarettes, uncomplicated: Secondary | ICD-10-CM | POA: Diagnosis present

## 2019-03-13 DIAGNOSIS — Z90710 Acquired absence of both cervix and uterus: Secondary | ICD-10-CM

## 2019-03-13 DIAGNOSIS — I82402 Acute embolism and thrombosis of unspecified deep veins of left lower extremity: Secondary | ICD-10-CM | POA: Diagnosis not present

## 2019-03-13 DIAGNOSIS — I63512 Cerebral infarction due to unspecified occlusion or stenosis of left middle cerebral artery: Secondary | ICD-10-CM | POA: Diagnosis not present

## 2019-03-13 DIAGNOSIS — Z89611 Acquired absence of right leg above knee: Secondary | ICD-10-CM

## 2019-03-13 DIAGNOSIS — R41841 Cognitive communication deficit: Secondary | ICD-10-CM | POA: Diagnosis not present

## 2019-03-13 DIAGNOSIS — Z7901 Long term (current) use of anticoagulants: Secondary | ICD-10-CM | POA: Diagnosis not present

## 2019-03-13 DIAGNOSIS — Z20822 Contact with and (suspected) exposure to covid-19: Secondary | ICD-10-CM | POA: Diagnosis present

## 2019-03-13 DIAGNOSIS — Z79899 Other long term (current) drug therapy: Secondary | ICD-10-CM

## 2019-03-13 DIAGNOSIS — J069 Acute upper respiratory infection, unspecified: Secondary | ICD-10-CM | POA: Diagnosis not present

## 2019-03-13 DIAGNOSIS — I82412 Acute embolism and thrombosis of left femoral vein: Secondary | ICD-10-CM | POA: Diagnosis present

## 2019-03-13 DIAGNOSIS — I6389 Other cerebral infarction: Secondary | ICD-10-CM | POA: Diagnosis not present

## 2019-03-13 DIAGNOSIS — R4701 Aphasia: Secondary | ICD-10-CM | POA: Diagnosis present

## 2019-03-13 DIAGNOSIS — I82442 Acute embolism and thrombosis of left tibial vein: Secondary | ICD-10-CM | POA: Diagnosis not present

## 2019-03-13 DIAGNOSIS — R29703 NIHSS score 3: Secondary | ICD-10-CM | POA: Diagnosis present

## 2019-03-13 DIAGNOSIS — R29810 Facial weakness: Secondary | ICD-10-CM | POA: Diagnosis not present

## 2019-03-13 DIAGNOSIS — R471 Dysarthria and anarthria: Secondary | ICD-10-CM | POA: Diagnosis not present

## 2019-03-13 DIAGNOSIS — Z66 Do not resuscitate: Secondary | ICD-10-CM | POA: Diagnosis present

## 2019-03-13 DIAGNOSIS — I82432 Acute embolism and thrombosis of left popliteal vein: Secondary | ICD-10-CM | POA: Diagnosis not present

## 2019-03-13 DIAGNOSIS — R221 Localized swelling, mass and lump, neck: Secondary | ICD-10-CM | POA: Diagnosis not present

## 2019-03-13 DIAGNOSIS — I63412 Cerebral infarction due to embolism of left middle cerebral artery: Secondary | ICD-10-CM | POA: Diagnosis not present

## 2019-03-13 DIAGNOSIS — E890 Postprocedural hypothyroidism: Secondary | ICD-10-CM | POA: Diagnosis present

## 2019-03-13 DIAGNOSIS — I739 Peripheral vascular disease, unspecified: Secondary | ICD-10-CM | POA: Diagnosis not present

## 2019-03-13 DIAGNOSIS — I6502 Occlusion and stenosis of left vertebral artery: Secondary | ICD-10-CM | POA: Diagnosis not present

## 2019-03-13 DIAGNOSIS — I10 Essential (primary) hypertension: Secondary | ICD-10-CM | POA: Diagnosis present

## 2019-03-13 DIAGNOSIS — I69328 Other speech and language deficits following cerebral infarction: Secondary | ICD-10-CM | POA: Diagnosis not present

## 2019-03-13 DIAGNOSIS — R1310 Dysphagia, unspecified: Secondary | ICD-10-CM | POA: Diagnosis not present

## 2019-03-13 DIAGNOSIS — Z86718 Personal history of other venous thrombosis and embolism: Secondary | ICD-10-CM

## 2019-03-13 DIAGNOSIS — E7800 Pure hypercholesterolemia, unspecified: Secondary | ICD-10-CM | POA: Diagnosis not present

## 2019-03-13 DIAGNOSIS — I82409 Acute embolism and thrombosis of unspecified deep veins of unspecified lower extremity: Secondary | ICD-10-CM | POA: Diagnosis present

## 2019-03-13 DIAGNOSIS — Z03818 Encounter for observation for suspected exposure to other biological agents ruled out: Secondary | ICD-10-CM | POA: Diagnosis not present

## 2019-03-13 DIAGNOSIS — R05 Cough: Secondary | ICD-10-CM | POA: Diagnosis not present

## 2019-03-13 DIAGNOSIS — I824Y2 Acute embolism and thrombosis of unspecified deep veins of left proximal lower extremity: Secondary | ICD-10-CM | POA: Diagnosis not present

## 2019-03-13 DIAGNOSIS — I6523 Occlusion and stenosis of bilateral carotid arteries: Secondary | ICD-10-CM | POA: Diagnosis not present

## 2019-03-13 DIAGNOSIS — I82452 Acute embolism and thrombosis of left peroneal vein: Secondary | ICD-10-CM | POA: Diagnosis not present

## 2019-03-13 DIAGNOSIS — E039 Hypothyroidism, unspecified: Secondary | ICD-10-CM | POA: Diagnosis not present

## 2019-03-13 DIAGNOSIS — I639 Cerebral infarction, unspecified: Secondary | ICD-10-CM | POA: Diagnosis not present

## 2019-03-13 DIAGNOSIS — E785 Hyperlipidemia, unspecified: Secondary | ICD-10-CM | POA: Diagnosis present

## 2019-03-13 DIAGNOSIS — N179 Acute kidney failure, unspecified: Secondary | ICD-10-CM | POA: Diagnosis present

## 2019-03-13 DIAGNOSIS — E1165 Type 2 diabetes mellitus with hyperglycemia: Secondary | ICD-10-CM | POA: Diagnosis not present

## 2019-03-13 DIAGNOSIS — Z7401 Bed confinement status: Secondary | ICD-10-CM | POA: Diagnosis not present

## 2019-03-13 DIAGNOSIS — G459 Transient cerebral ischemic attack, unspecified: Secondary | ICD-10-CM | POA: Diagnosis not present

## 2019-03-13 DIAGNOSIS — Q278 Other specified congenital malformations of peripheral vascular system: Secondary | ICD-10-CM

## 2019-03-13 DIAGNOSIS — G8190 Hemiplegia, unspecified affecting unspecified side: Secondary | ICD-10-CM | POA: Diagnosis not present

## 2019-03-13 DIAGNOSIS — Z7984 Long term (current) use of oral hypoglycemic drugs: Secondary | ICD-10-CM | POA: Diagnosis not present

## 2019-03-13 DIAGNOSIS — I69828 Other speech and language deficits following other cerebrovascular disease: Secondary | ICD-10-CM | POA: Diagnosis not present

## 2019-03-13 DIAGNOSIS — R1312 Dysphagia, oropharyngeal phase: Secondary | ICD-10-CM | POA: Diagnosis not present

## 2019-03-13 DIAGNOSIS — I959 Hypotension, unspecified: Secondary | ICD-10-CM | POA: Diagnosis not present

## 2019-03-13 DIAGNOSIS — D649 Anemia, unspecified: Secondary | ICD-10-CM | POA: Diagnosis present

## 2019-03-13 DIAGNOSIS — R531 Weakness: Secondary | ICD-10-CM | POA: Diagnosis not present

## 2019-03-13 DIAGNOSIS — R29818 Other symptoms and signs involving the nervous system: Secondary | ICD-10-CM | POA: Diagnosis not present

## 2019-03-13 DIAGNOSIS — E1140 Type 2 diabetes mellitus with diabetic neuropathy, unspecified: Secondary | ICD-10-CM | POA: Diagnosis not present

## 2019-03-13 LAB — DIFFERENTIAL
Abs Immature Granulocytes: 0.03 10*3/uL (ref 0.00–0.07)
Basophils Absolute: 0 10*3/uL (ref 0.0–0.1)
Basophils Relative: 0 %
Eosinophils Absolute: 0.4 10*3/uL (ref 0.0–0.5)
Eosinophils Relative: 4 %
Immature Granulocytes: 0 %
Lymphocytes Relative: 21 %
Lymphs Abs: 1.8 10*3/uL (ref 0.7–4.0)
Monocytes Absolute: 0.4 10*3/uL (ref 0.1–1.0)
Monocytes Relative: 5 %
Neutro Abs: 5.9 10*3/uL (ref 1.7–7.7)
Neutrophils Relative %: 70 %

## 2019-03-13 LAB — COMPREHENSIVE METABOLIC PANEL
ALT: 15 U/L (ref 0–44)
AST: 21 U/L (ref 15–41)
Albumin: 3.4 g/dL — ABNORMAL LOW (ref 3.5–5.0)
Alkaline Phosphatase: 74 U/L (ref 38–126)
Anion gap: 11 (ref 5–15)
BUN: 13 mg/dL (ref 8–23)
CO2: 26 mmol/L (ref 22–32)
Calcium: 9.4 mg/dL (ref 8.9–10.3)
Chloride: 104 mmol/L (ref 98–111)
Creatinine, Ser: 1.19 mg/dL — ABNORMAL HIGH (ref 0.44–1.00)
GFR calc Af Amer: 53 mL/min — ABNORMAL LOW (ref >60)
GFR calc non Af Amer: 46 mL/min — ABNORMAL LOW (ref >60)
Glucose, Bld: 164 mg/dL — ABNORMAL HIGH (ref 70–99)
Potassium: 4.3 mmol/L (ref 3.5–5.1)
Sodium: 141 mmol/L (ref 135–145)
Total Bilirubin: 0.3 mg/dL (ref 0.3–1.2)
Total Protein: 6.4 g/dL — ABNORMAL LOW (ref 6.5–8.1)

## 2019-03-13 LAB — RESPIRATORY PANEL BY RT PCR (FLU A&B, COVID)
Influenza A by PCR: NEGATIVE
Influenza B by PCR: NEGATIVE
SARS Coronavirus 2 by RT PCR: NEGATIVE

## 2019-03-13 LAB — I-STAT CHEM 8, ED
BUN: 14 mg/dL (ref 8–23)
Calcium, Ion: 1.19 mmol/L (ref 1.15–1.40)
Chloride: 104 mmol/L (ref 98–111)
Creatinine, Ser: 1.1 mg/dL — ABNORMAL HIGH (ref 0.44–1.00)
Glucose, Bld: 159 mg/dL — ABNORMAL HIGH (ref 70–99)
HCT: 34 % — ABNORMAL LOW (ref 36.0–46.0)
Hemoglobin: 11.6 g/dL — ABNORMAL LOW (ref 12.0–15.0)
Potassium: 4.2 mmol/L (ref 3.5–5.1)
Sodium: 141 mmol/L (ref 135–145)
TCO2: 28 mmol/L (ref 22–32)

## 2019-03-13 LAB — CBC
HCT: 35.1 % — ABNORMAL LOW (ref 36.0–46.0)
Hemoglobin: 11.2 g/dL — ABNORMAL LOW (ref 12.0–15.0)
MCH: 30.4 pg (ref 26.0–34.0)
MCHC: 31.9 g/dL (ref 30.0–36.0)
MCV: 95.1 fL (ref 80.0–100.0)
Platelets: 354 10*3/uL (ref 150–400)
RBC: 3.69 MIL/uL — ABNORMAL LOW (ref 3.87–5.11)
RDW: 13.4 % (ref 11.5–15.5)
WBC: 8.5 10*3/uL (ref 4.0–10.5)
nRBC: 0 % (ref 0.0–0.2)

## 2019-03-13 LAB — APTT: aPTT: 24 seconds (ref 24–36)

## 2019-03-13 LAB — PROTIME-INR
INR: 1 (ref 0.8–1.2)
Prothrombin Time: 12.9 seconds (ref 11.4–15.2)

## 2019-03-13 MED ORDER — INSULIN ASPART 100 UNIT/ML ~~LOC~~ SOLN: 0.0000 [IU] | Freq: Three times a day (TID) | SUBCUTANEOUS | Status: DC

## 2019-03-13 MED ORDER — FA-PYRIDOXINE-CYANOCOBALAMIN 2.5-25-2 MG PO TABS
1.0000 | ORAL_TABLET | ORAL | Status: DC
  Administered 2019-03-14 – 2019-03-20 (×4): 1 via ORAL
  Filled 2019-03-13 (×4): qty 1

## 2019-03-13 MED ORDER — HYDROMORPHONE HCL 1 MG/ML IJ SOLN
0.2500 mg | Freq: Once | INTRAMUSCULAR | Status: AC
  Administered 2019-03-13: 23:00:00 0.25 mg via INTRAVENOUS
  Filled 2019-03-13: qty 0.5

## 2019-03-13 MED ORDER — ASPIRIN 300 MG RE SUPP
300.0000 mg | Freq: Every day | RECTAL | Status: DC
  Administered 2019-03-13: 20:00:00 300 mg via RECTAL
  Filled 2019-03-13: qty 1

## 2019-03-13 MED ORDER — LACTATED RINGERS IV SOLN: INTRAVENOUS | Status: DC

## 2019-03-13 MED ORDER — ENOXAPARIN SODIUM 40 MG/0.4ML ~~LOC~~ SOLN
40.0000 mg | SUBCUTANEOUS | Status: DC
  Administered 2019-03-14: 11:00:00 40 mg via SUBCUTANEOUS
  Filled 2019-03-13: qty 0.4

## 2019-03-13 MED ORDER — LACTATED RINGERS IV BOLUS
500.0000 mL | Freq: Once | INTRAVENOUS | Status: AC
  Administered 2019-03-13: 23:00:00 500 mL via INTRAVENOUS

## 2019-03-13 MED ORDER — INSULIN ASPART 100 UNIT/ML ~~LOC~~ SOLN
0.0000 [IU] | SUBCUTANEOUS | Status: DC
  Administered 2019-03-14: 17:00:00 2 [IU] via SUBCUTANEOUS
  Administered 2019-03-15 (×2): 1 [IU] via SUBCUTANEOUS
  Administered 2019-03-15: 21:00:00 2 [IU] via SUBCUTANEOUS
  Administered 2019-03-15 – 2019-03-16 (×4): 1 [IU] via SUBCUTANEOUS
  Administered 2019-03-17 (×2): 2 [IU] via SUBCUTANEOUS
  Administered 2019-03-17 – 2019-03-19 (×3): 1 [IU] via SUBCUTANEOUS
  Administered 2019-03-19: 12:00:00 2 [IU] via SUBCUTANEOUS
  Administered 2019-03-20 (×2): 1 [IU] via SUBCUTANEOUS

## 2019-03-13 MED ORDER — IOHEXOL 350 MG/ML SOLN
50.0000 mL | Freq: Once | INTRAVENOUS | Status: AC | PRN
  Administered 2019-03-13: 18:00:00 50 mL via INTRAVENOUS

## 2019-03-13 MED ORDER — IOHEXOL 350 MG/ML SOLN
60.0000 mL | Freq: Once | INTRAVENOUS | Status: AC | PRN
  Administered 2019-03-13: 17:00:00 60 mL via INTRAVENOUS

## 2019-03-13 MED ORDER — DICLOFENAC SODIUM 1 % EX GEL
4.0000 g | Freq: Four times a day (QID) | CUTANEOUS | Status: DC | PRN
  Administered 2019-03-14 – 2019-03-20 (×5): 4 g via TOPICAL
  Filled 2019-03-13: qty 100

## 2019-03-13 MED ORDER — ONDANSETRON HCL 4 MG/2ML IJ SOLN: 4.0000 mg | Freq: Three times a day (TID) | INTRAMUSCULAR | Status: DC | PRN

## 2019-03-13 MED ORDER — ASPIRIN 81 MG PO TBEC: 81.0000 mg | DELAYED_RELEASE_TABLET | Freq: Every day | ORAL | Status: DC

## 2019-03-13 MED ORDER — ATORVASTATIN CALCIUM 40 MG PO TABS
40.0000 mg | ORAL_TABLET | Freq: Every day | ORAL | Status: DC
  Administered 2019-03-14 – 2019-03-19 (×6): 40 mg via ORAL
  Filled 2019-03-13 (×7): qty 1

## 2019-03-13 MED ORDER — LEVOTHYROXINE SODIUM 100 MCG/5ML IV SOLN
50.0000 ug | Freq: Every day | INTRAVENOUS | Status: DC
  Administered 2019-03-14 – 2019-03-16 (×3): 50 ug via INTRAVENOUS
  Filled 2019-03-13 (×3): qty 5

## 2019-03-13 MED ORDER — TETRAHYDROZOLINE HCL 0.05 % OP SOLN
1.0000 [drp] | Freq: Every day | OPHTHALMIC | Status: DC | PRN
  Filled 2019-03-13: qty 15

## 2019-03-13 MED ORDER — ASPIRIN 325 MG PO TABS
325.0000 mg | ORAL_TABLET | Freq: Every day | ORAL | Status: DC
  Administered 2019-03-14: 11:00:00 325 mg via ORAL
  Filled 2019-03-13: qty 1

## 2019-03-13 NOTE — Consult Note (Signed)
Neurology Consultation Reason for Consult: Right-sided weakness Referring Physician: Trifan, M  CC: Right-sided weakness  History is obtained from: Patient  HPI: Emma [name] is a 74 y.o. female with a history of hypertension, hyperlipidemia, diabetes presents with right-sided weakness that was present on awakening.  She went to bed around 11 PM and at that time she was in her normal state of health.  On awakening, she noticed that she was not feeling right and was having more difficulty using her right side.  She typically uses a wheelchair because of a previous amputation on the right and found that she was having difficulty with her right hand pushing her wheelchair.  She denies numbness, visual change.  She does have some difficulty with dysarthria.   LKW: 11 PM 2/24 tpa given?: no, outside window   ROS: A 14 point ROS was performed and is negative except as noted in the HPI.   Past Medical History:  Diagnosis Date  . Arthritis    Back and right shoulder  . Constipation   . Diabetes mellitus    Type II  . Diabetes mellitus without complication ([place])   . Diabetic neuropathy ([place])   . History of blood transfusion   . Hypercholesteremia   . Hypertension   . Hypothyroidism   . Pneumonia   . Thyroid disease      Family history: No history of stroke   Social History:  reports that she has been smoking cigarettes. She has a 50.00 pack-year smoking history. She has never used smokeless tobacco. She reports current alcohol use. She reports that she does not use drugs.   Exam: Current vital signs: BP 127/65 (BP Location: Right Arm)   Pulse 94   Temp 98.7 F (37.1 C) (Oral)   Resp (!) 21   SpO2 98%  Vital signs in last 24 hours: Temp:  [98.7 F (37.1 C)] 98.7 F (37.1 C) (02/25 1624) Pulse Rate:  [94] 94 (02/25 1624) Resp:  [21] 21 (02/25 1624) BP: (127)/(65) 127/65 (02/25 1624) SpO2:  [95 %-98 %] 98 % (02/25 1624)   Physical Exam  Constitutional: Appears  well-developed and well-nourished.  Psych: Affect appropriate to situation Eyes: No scleral injection HENT: No OP obstrucion MSK: no joint deformities.  Cardiovascular: Normal rate and regular rhythm.  Respiratory: Effort normal, non-labored breathing GI: Soft.  No distension. There is no tenderness.  Skin: WDI  Neuro: Mental Status: Patient is awake, alert, oriented to person, place, month, year, and situation. Patient is able to give a clear and coherent history. No signs of aphasia or neglect Cranial Nerves: II: Visual Fields are full. Pupils are equal, round, and reactive to light.   III,IV, VI: EOMI without ptosis or diploplia.  V: Facial sensation is symmetric to temperature VII: Facial movement with right facial weakness VIII: hearing is intact to voice X: Uvula elevates symmetrically XI: Shoulder shrug is symmetric. XII: tongue is midline without atrophy or fasciculations.  Motor: Tone is normal. Bulk is normal. 5/5 strength was present on the left, 4/5 in the right arm and leg Sensory: Sensation is symmetric to light touch and temperature in the arms and legs. Cerebellar: No ataxia on the left, consistent with weakness on the right.    I have reviewed labs in epic and the results pertinent to this consultation are: Cr 1.19  I have reviewed the images obtained:CT/CTA/CTP - though it is read as an M2 that is occluded, the vessel caliber is actually quite small and I think  more M2/M3 junction, after coming off the MCA as a trifurcation. The CTP shows 6 ccs of penumbra.   Impression: 74 yo F with right sided weakness due to an acute ischemic stroke. She will need to be admitted for therapy and secondary risk factor modification. Etiology could be thrombotic or embolic at this point. With such a small penumbra and mild deficits(NIH 3) I would not favor intervention at this time.   Recommendations: - HgbA1c, fasting lipid panel - MRI of the brain without contrast -  Frequent neuro checks - Echocardiogram - Prophylactic therapy-Antiplatelet med: Aspirin - dose 325mg  PO or 300mg  PR - Risk factor modification - Telemetry monitoring - PT consult, OT consult, Speech consult - Stroke team to follow  , MD [facility] [phone]  If 7pm- 7am, please page neurology on call as listed in AMION.

## 2019-03-13 NOTE — ED Notes (Signed)
Pt transported to MRI 

## 2019-03-13 NOTE — ED Notes (Signed)
Corky Mull daughter [phone] looking for an update on pt

## 2019-03-13 NOTE — ED Triage Notes (Signed)
Pt from home. LSN @2300  03/11/18 Pt woke up today at 0630 and felt different. She tried to push thru her day. Laid back down around 0730 and couldn't use her right arm. EMS reports LVO score of 1. Pt alert and oriented X4 and follows commands. Speech is slurred during triage

## 2019-03-13 NOTE — H&P (Addendum)
Date: 03/13/2019               Patient Name:  Emma [name] MRN: [id number]  DOB: [birth year] Age / Sex: 74 y.o., female   PCP: System, Pcp Not In         Medical Service: Internal Medicine Teaching Service         Attending Physician: Dr. Renaye Rakers, Kermit Balo, MD    First Contact: Sande Brothers, MD, Viviann Spare Pager: SW [phone])  Second Contact: Delma Officer, MD, V[place] Pager: Namon Cirri [phone])       After Hours (After 5p/  First Contact Pager: [phone]  weekends / holidays): Second Contact Pager: [phone]   Chief Complaint: R side weakness   History of Present Illness: Emma [name] is a 74 year old female with a past medical history significant for PVD (s/p R AKA), HTN, HLD, hypothyroidism, T2DM, who presented today with right-sided weakness and slurred speech upon awakening from her sleep. She went to bed around 11 PM the prior day and she was in her normal state of health.  She woke up, she states that she felt "off" and noticed it was more difficult to move her right arm. The patient decided to take a nap, thinking that her symptoms would improve when she woke, however her symptoms worsened. She uses a wheelchair for ambulation at baseline due to a previous amputation of the right leg.  She noticed increased difficulty using her right arm to push her wheelchair.  In the ED, a CBC, CMP, UDS, UA, and PT and INR were obtained.  Patient is a mild anemia with a hemoglobin of 11.2.  CMP was notable for elevated creatinine 1.19 (baseline normal).  PT and INR were normal.  Code stroke was initiated upon the patient's arrival.  She underwent a CT head, CT angio head and neck, and a CT per cerebral perfusion scan.  Findings were consistent with an acute left MCA stroke along with typical atherosclerotic vessels (bilateral internal carotid, right vertebral.  Neurology is on board and following the patient.  Meds:  -Acetaminophen 1300 mg by mouth as needed -Atorvastatin 10 mg daily -Gabapentin 1200  mg twice daily -Glimepiride 4 mg daily -Synthroid 100 mcg daily -Lisinopril 10 mg daily -Metformin 1500 mg daily -Methocarbamol 500 mg 3 times daily as needed -Multivitamin daily -Omega-3 1200 mg capsule twice daily -Zofran 4 mg every 8 hours as needed -Trileptal 300 mg daily -Tetrahydrozoline 1 drop both eyes daily  Allergies: Allergies as of 03/13/2019  . (No Known Allergies)   Past Medical History:  Diagnosis Date  . Arthritis    Back and right shoulder  . Constipation   . Diabetes mellitus    Type II  . Diabetes mellitus without complication ([facility])   . Diabetic neuropathy ([facility])   . History of blood transfusion   . Hypercholesteremia   . Hypertension   . Hypothyroidism   . Pneumonia   . Thyroid disease    Past Surgical History:  Procedure Laterality Date  . ABDOMINAL AORTOGRAM N/A 03/25/2018   Procedure: ABDOMINAL AORTOGRAM;  Surgeon: Maeola Harman, MD;  Location: [facility] INVASIVE CV LAB;  Service: Cardiovascular;  Laterality: N/A;  . ABDOMINAL HYSTERECTOMY    . AMPUTATION Right 03/29/2018   Procedure: AMPUTATION ABOVE KNEE;  Surgeon: Nada Libman, MD;  Location: [facility] OR;  Service: Vascular;  Laterality: Right;  . APPENDECTOMY    . BACK SURGERY    . BYPASS GRAFT FEMORAL-PERONEAL Right 03/26/2018   Procedure: BYPASS GRAFT FEMORAL-PERONEAL;  Surgeon: Waynetta Sandy, MD;  Location: [place];  Service: Vascular;  Laterality: Right;  . EXPLORATORY LAPAROTOMY     Adhesions removed and appendix  . FEMORAL-POPLITEAL BYPASS GRAFT Right 03/27/2018   Procedure: Right lower extremity angiogram, Right Femoral Peroneal artery bypass, Angioplasty Right Femoral Peroneal bypass, Thrombectomy of Femoral peroneal artery;  Surgeon: Waynetta Sandy, MD;  Location: [place];  Service: Vascular;  Laterality: Right;  . INTRAOPERATIVE ARTERIOGRAM Right 03/26/2018   Procedure: Intra Operative Arteriogram;  Surgeon: Waynetta Sandy, MD;  Location: [place];  Service:  Vascular;  Laterality: Right;  . LOWER EXTREMITY ANGIOGRAPHY Bilateral 03/25/2018   Procedure: LOWER EXTREMITY ANGIOGRAPHY;  Surgeon: Waynetta Sandy, MD;  Location: [place] CV LAB;  Service: Cardiovascular;  Laterality: Bilateral;  . LUMBAR LAMINECTOMY/DECOMPRESSION MICRODISCECTOMY N/A 09/09/2014   Procedure: L4-L5 DECOMPRESSION ;  Surgeon: Melina Schools, MD;  Location: [place];  Service: Orthopedics;  Laterality: N/A;  . PATCH ANGIOPLASTY Right 03/26/2018   Procedure: PATCH ANGIOPLASTY;  Surgeon: Waynetta Sandy, MD;  Location: [place];  Service: Vascular;  Laterality: Right;  . SHOULDER SURGERY    . THYROID SURGERY    . TONSILLECTOMY    . TOTAL THYROIDECTOMY    . VEIN HARVEST Right 03/26/2018   Procedure: Vein Harvest of Right Leg Greater Saphenous Vein in SITU;  Surgeon: Waynetta Sandy, MD;  Location: [place];  Service: Vascular;  Laterality: Right;   Family History: No family history on file.   Social History:  Social History   Tobacco Use  . Smoking status: Current Every Day Smoker    Packs/day: 1.00    Years: 50.00    Pack years: 50.00    Types: Cigarettes  . Smokeless tobacco: Never Used  Substance Use Topics  . Alcohol use: Yes    Comment: occasional  . Drug use: Never  -Lives in an apartment by herself since her husband passed away in 2010/05/19.  -She smokes 2-3 cigarettes per day and one pack will last her about a week.  -Denies EtOH use or recreational drug use   -He manages her medications herself.  Able to cook and clean for herself as well. -Ambulates with a wheelchair -Has groceries delivered to her home  Review of Systems: A complete ROS was negative except as per HPI.   Imaging: CT head: IMPRESSION: 1.No CT evidence of acute intracranial abnormality. 2.Moderate/advanced chronic small vessel ischemic disease. 3.Moderate generalized parenchymal atrophy  CT angio head: 1. Occlusion or near occlusion of a proximal to mid left M2 MCA  branch vessel as described. 2. 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 Sites of up to moderate stenosis within the cavernous segments bilaterally. 3. Mild atherosclerotic narrowing of the V4 left vertebral artery.  CT angio neck: 1. The bilateral common and internal carotid arteries are patent within the neck without stenosis. Mild mixed plaque at the carotid bifurcations. 2. The vertebral arteries are patent within the neck bilaterally. Moderate/severe atherosclerotic narrowing at the origin of the right vertebral artery. Mild/moderate atherosclerotic narrowing at the origin of the left vertebral artery. 3. Incidentally noted aberrant right subclavian artery. 4. 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  CT cerebral perfusion: The perfusion software identifies a 6 mL region of critically hypoperfused parenchyma in the left frontal lobe MCA vascular territory utilizing the Tmax>6 seconds threshold. The perfusion software identifies no core infarct. Reported mismatch volume 6 mL. Of  note, utilizing a Tmax > 4 seconds threshold, there is a 21 mL region of hypoperfusion in the left MCA territory.  MRI Head: 1. Small acute infarcts within the left frontal lobe. 2. Moderate chronic microvascular ischemic changes.  EKG: personally reviewed my interpretation is sinus rhythm.  No signs of acute ischemia  Physical Exam: Blood pressure (!) 119/58, pulse 86, temperature 98.7 F (37.1 C), temperature source Oral, resp. rate (!) 35, height 5\' 8"  (1.727 m), weight 70.3 kg, SpO2 98 %.  Physical Exam Vitals reviewed.  Constitutional:      General: She is not in acute distress.    Appearance: Normal appearance. She is obese. She is ill-appearing. She is not toxic-appearing or diaphoretic.  HENT:     Head: Normocephalic and atraumatic.  Eyes:     General: No scleral icterus.       Right eye: No  discharge.        Left eye: No discharge.     Extraocular Movements: Extraocular movements intact.     Conjunctiva/sclera: Conjunctivae normal.     Pupils: Pupils are equal, round, and reactive to light.  Cardiovascular:     Rate and Rhythm: Normal rate and regular rhythm.     Pulses: Normal pulses.     Heart sounds: Normal heart sounds. No murmur. No friction rub. No gallop.   Pulmonary:     Effort: Pulmonary effort is normal. No respiratory distress.     Breath sounds: No wheezing or rales.     Comments: Diminished breath sounds due to large body habitus  Abdominal:     General: Abdomen is flat. There is no distension.     Palpations: Abdomen is soft.     Tenderness: There is no abdominal tenderness. There is no guarding.  Musculoskeletal:     Left lower leg: No edema.     Comments: RLE AKA  Skin:    General: Skin is warm.    Neurological Mental Status: Patient is awake, alert, oriented x3.  Follows all commands appropriately and appropriately answers questions Aphasia present. Cranial Nerves: II: Pupils equal, round, and reactive to light.   III,IV, VI: EOMI without ptosis or diploplia.  V: Facial sensation is symmetric to light touch and temperature. VII: Facial movement is symmetric.  VIII: hearing is intact to voice X: Uvula elevates symmetrically XI: Shoulder shrug is symmetric. XII: tongue is midline without atrophy or fasciculations.  Motor:  RUE: 4/5 with shoulder, elbow, and wrist flexion and extension. 4/5 grip strength RLE: unable to access due to amputation  LUE: 5/5 with shoulder, elbow, and wrist flexion and extension.  5/5 grip strength LLE: 5/5 with hip, knee and ankle flexion and extension Sensory: Sensation is grossly intact in bilateral upper and lower extremities Deep Tendon Reflexes: 2+ throughout with upper and lower extremities Plantars: Withdraws to left plantar foot  Assessment & Plan by Problem: Active Problems:   TIA (transient ischemic  attack)   Stroke (cerebrum) (HCC)  In summary, Emma [name] is a 74 year old female with a past medical history significant for PVD (s/p R AKA), HTN, HLD, hypothyroidism, T2DM, who presented today with right-sided weakness dysarthria and dysphagia.  Head imaging findings are consistent with L MCA CVA. Patient is to be admitted for secondary stroke work-up.  #Left MCA CVA #Dysarthria #Right-sided weakness #Dysphagia #HTN #HLD: Imaging consistent with left MCA CVA.  MRI demonstrated  Small acute infarcts in the L frontal lobe. Patient is outside of the window for receiving TPA.  Patient  currently has right upper extremity weakness. Patient states she is left-handed. -Neurology is following, and we appreciate their recommendations. No intervention is planned at this time. Patient takes lisinopril 10 mg daily and atorvastatin 10 mg daily at home.  -Hgb A1c  -Lipid panel  -Echocardiogram  -Aspirin 325 mg daily  -Telemetry  -PT/OT/SLP evaluations ordered -Permissive hypertension the next 48 hours, hold home lisinopril -Atorvastatin 40 mg daily  #AKI: Creatinine elevated to 1.19 on admission.  Baseline was normal of 1 year ago. -LR 500 cc bolus -LR 75 cc/hr maintenance -Monitor with Daily BMP  #T2DM: Last recorded A1c from August 2016 was 6.4.  Patient is prescribed Metformin 1500 mg daily, and glimepiride 4 mg daily.  Patient has significant peripheral neuropathy, however she failed her swallow screen and is not able to take of her p.o. gabapentin or Trileptal at this time. -HgbA1c ordered -SSI  #Hypothyroidism #Soft tissue mass: 15 mm soft tissue mass seen on CT angio of the neck at the midline just beneath the hyoid bone.  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  Takes Synthroid 100 mcg daily at home. -IV Synthroid 50 mcg daily  #FEN/GI -Diet: NPO -Fluids: LR 75 cc/hr -IV Zofran 4 mg every 8 hours as needed for nausea  #DVT prophylaxis -Lovenox 40 mg subcu injections  daily  #CODE STATUS: DNR  #Dispo: Admit patient to Inpatient with expected length of stay greater than 2 midnights. Prior to Admission Living Arrangement: Home Anticipated Discharge Location: Home, SNF, or CIR Barriers to Discharge:  Pending medical work-up  Signed: Kirt Boys, MD Internal Medicine, PGY1 Pager: [phone]  03/13/2019,10:14 PM

## 2019-03-13 NOTE — ED Provider Notes (Signed)
[facility] EMERGENCY DEPARTMENT Provider Note   CSN: [id number] Arrival date & time: 03/13/19  1612     History No chief complaint on file.   Emma [name] is a 74 y.o. female.  Patient is a 74 year old female coming from nursing home with history of diabetes, hypertension, hypothyroidism, peripheral arterial disease presenting to the emergency department for stroke symptoms.  Patient reports that she woke up around 630 this morning and she felt a little "odd".  At that time she had full strength of all of her extremities.  Reports that she went to take a nap at 7:30 AM and woke up around 8:30 AM and woke up with slurred speech and right arm weakness which has been persistent since this morning.  Denies any history of stroke.        Past Medical History:  Diagnosis Date  . Arthritis    Back and right shoulder  . Constipation   . Diabetes mellitus    Type II  . Diabetes mellitus without complication ([place])   . Diabetic neuropathy ([place])   . History of blood transfusion   . Hypercholesteremia   . Hypertension   . Hypothyroidism   . Pneumonia   . Thyroid disease     Patient Active Problem List   Diagnosis Date Noted  . PAD (peripheral artery disease) ([place]) 03/23/2018  . Back pain 09/09/2014  . HYPERLIPIDEMIA 12/18/2006  . RECTAL POLYPS 12/18/2006  . 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 SBO 08/01/2006  . GOITER NOS 03/11/2006  . PERIPHERAL NEUROPATHY 03/11/2006  . HYPERTENSION 03/11/2006  . OSTEOPENIA 03/11/2006  . FOOT DROP 03/11/2006    Past Surgical History:  Procedure Laterality Date  . ABDOMINAL AORTOGRAM N/A 03/25/2018   Procedure: ABDOMINAL AORTOGRAM;  Surgeon: Waynetta Sandy, MD;  Location: [facility];  Service: Cardiovascular;  Laterality: N/A;  . ABDOMINAL HYSTERECTOMY    . AMPUTATION Right 03/29/2018   Procedure: AMPUTATION ABOVE KNEE;  Surgeon: Serafina Mitchell, MD;  Location: [facility];  Service: Vascular;  Laterality: Right;  .  APPENDECTOMY    . BACK SURGERY    . BYPASS GRAFT FEMORAL-PERONEAL Right 03/26/2018   Procedure: BYPASS GRAFT [place];  Surgeon: Waynetta Sandy, MD;  Location: [facility];  Service: Vascular;  Laterality: Right;  . EXPLORATORY LAPAROTOMY     Adhesions removed and appendix  . FEMORAL-POPLITEAL BYPASS GRAFT Right 03/27/2018   Procedure: Right lower extremity angiogram, Right Femoral Peroneal artery bypass, Angioplasty Right Femoral Peroneal bypass, Thrombectomy of Femoral peroneal artery;  Surgeon: Waynetta Sandy, MD;  Location: [facility];  Service: Vascular;  Laterality: Right;  . INTRAOPERATIVE ARTERIOGRAM Right 03/26/2018   Procedure: Intra Operative Arteriogram;  Surgeon: Waynetta Sandy, MD;  Location: [facility];  Service: Vascular;  Laterality: Right;  . LOWER EXTREMITY ANGIOGRAPHY Bilateral 03/25/2018   Procedure: LOWER EXTREMITY ANGIOGRAPHY;  Surgeon: Waynetta Sandy, MD;  Location: [facility];  Service: Cardiovascular;  Laterality: Bilateral;  . LUMBAR LAMINECTOMY/DECOMPRESSION MICRODISCECTOMY N/A 09/09/2014   Procedure: L4-L5 DECOMPRESSION ;  Surgeon: Melina Schools, MD;  Location: [facility];  Service: Orthopedics;  Laterality: N/A;  . PATCH ANGIOPLASTY Right 03/26/2018   Procedure: PATCH ANGIOPLASTY;  Surgeon: Waynetta Sandy, MD;  Location: [facility];  Service: Vascular;  Laterality: Right;  . SHOULDER SURGERY    . THYROID SURGERY    . TONSILLECTOMY    . TOTAL THYROIDECTOMY    . VEIN HARVEST Right 03/26/2018   Procedure: Vein Harvest of Right Leg  Greater Saphenous Vein in SITU;  Surgeon: Maeola Harman, MD;  Location: [facility] OR;  Service: Vascular;  Laterality: Right;     OB History   No obstetric history on file.     No family history on file.  Social History   Tobacco Use  . Smoking status: Current Every Day Smoker    Packs/day: 1.00    Years: 50.00    Pack years: 50.00    Types: Cigarettes  . Smokeless tobacco: Never  Used  Substance Use Topics  . Alcohol use: Yes    Comment: occasional  . Drug use: Never    Home Medications Prior to Admission medications   Medication Sig Start Date End Date Taking? Authorizing Provider  acetaminophen (TYLENOL) 650 MG CR tablet Take 1,300 mg by mouth daily as needed (back pain).    [provider]  aspirin EC 81 MG EC tablet Take 1 tablet (81 mg total) by mouth daily. Patient not taking: Reported on 12/30/2018 04/02/18   Emilie Rutter, PA-C  atorvastatin (LIPITOR) 10 MG tablet Take 10 mg by mouth daily. 12/17/17   [provider]  gabapentin (NEURONTIN) 600 MG tablet Take 1,200 mg by mouth 2 (two) times daily with breakfast and lunch. 02/06/18   [provider]  glimepiride (AMARYL) 4 MG tablet Take 4 mg by mouth daily.    [provider]  L-Methylfolate-B6-B12 [place]) 3-35-2 MG TABS Take 1 tablet by mouth See admin instructions. Take one tablet by mouth every 2-3 days 03/22/18   [provider]  levothyroxine (SYNTHROID, LEVOTHROID) 100 MCG tablet Take 100 mcg by mouth daily. 02/04/18   [provider]  lisinopril (PRINIVIL,ZESTRIL) 10 MG tablet Take 10 mg by mouth daily. 02/06/18   [provider]  metFORMIN (GLUCOPHAGE) 1000 MG tablet Take 1,000 mg by mouth 2 (two) times daily with breakfast and lunch. 02/06/18   [provider]  metFORMIN (GLUMETZA) 500 MG (MOD) 24 hr tablet Take 500 mg by mouth daily with breakfast.     [provider]  methocarbamol (ROBAXIN) 500 MG tablet Take 1 tablet (500 mg total) by mouth 3 (three) times daily as needed for muscle spasms. 09/09/14   Venita Lick, MD  Multiple Vitamins-Minerals (MULTIVITAMIN & MINERAL PO) Take 1 tablet by mouth daily.    [provider]  Omega 3 1200 MG CAPS Take 1,200 mg by mouth 2 (two) times daily.    [provider]  ondansetron (ZOFRAN) 4 MG tablet Take 1 tablet (4 mg total) by mouth every 8 (eight) hours as  needed for nausea or vomiting. 09/09/14   Venita Lick, MD  Oxcarbazepine (TRILEPTAL) 300 MG tablet Take 300 mg by mouth daily.    [provider]  Tetrahydrozoline HCl (VISINE OP) Place 1 drop into both eyes daily as needed (dry eyes).    [provider]    Allergies    Patient has no known allergies.  Review of Systems   Review of Systems  Constitutional: Negative for chills and fever.  HENT: Negative for congestion and rhinorrhea.   Eyes: Negative for visual disturbance.  Respiratory: Negative for cough and shortness of breath.   Gastrointestinal: Negative for abdominal pain, nausea and vomiting.  Genitourinary: Negative for dyspareunia.  Musculoskeletal: Negative for back pain.  Neurological: Positive for speech difficulty, weakness and numbness. Negative for dizziness, facial asymmetry, light-headedness and headaches.    Physical Exam Updated Vital Signs BP 127/65 (BP Location: Right Arm)   Pulse 94  Temp 98.7 F (37.1 C) (Oral)   Resp (!) 21   SpO2 98%   Physical Exam Vitals and nursing note reviewed.  Constitutional:      General: She is not in acute distress.    Appearance: Normal appearance. She is not ill-appearing, toxic-appearing or diaphoretic.  HENT:     Head: Normocephalic.     Mouth/Throat:     Mouth: Mucous membranes are moist.  Eyes:     Conjunctiva/sclera: Conjunctivae normal.  Pulmonary:     Effort: Pulmonary effort is normal.  Musculoskeletal:     Comments: Right AKA  Skin:    General: Skin is dry.  Neurological:     Mental Status: She is alert and oriented to person, place, and time.     Cranial Nerves: Dysarthria present.     Motor: Weakness present. No atrophy.     Coordination: Coordination is intact.     Comments: Moderate RUE weakness. Could not assess RLE due to AKA.  Psychiatric:        Mood and Affect: Mood normal.     ED Results / Procedures / Treatments   Labs (all labs ordered are listed, but only abnormal  results are displayed) Labs Reviewed  RESPIRATORY PANEL BY RT PCR (FLU A&B, COVID)  ETHANOL  PROTIME-INR  APTT  CBC  DIFFERENTIAL  COMPREHENSIVE METABOLIC PANEL  RAPID URINE DRUG SCREEN, HOSP PERFORMED  URINALYSIS, ROUTINE W REFLEX MICROSCOPIC  I-STAT CHEM 8, ED    EKG None  Radiology No results found.  Procedures Procedures (including critical care time)  Medications Ordered in ED Medications - No data to display  ED Course  I have reviewed the triage vital signs and the nursing notes.  Pertinent labs & imaging results that were available during my care of the patient were reviewed by me and considered in my medical decision making (see chart for details).  Clinical Course as of Mar 12 1945  Thu Mar 13, 2019  1643 Patient with new onset stroke symptoms since 7:30 this AM. R arm weakness and dysarthria. Seen by myself and Dr. Renaye Rakers. The decision to activate code stroke was made based on patients symptoms and time frame. Not candidate for TPA but LVO suspected in a <24 hour time frame   [KM]  1709 Discussed with neurology, they advised she is likely having a small cortical stroke and advised medical admission for stroke workup.   [KM]  1831 74 yo female presenting with right sided arm weakness beginning around 0730-0830 this morning, noted AFTER she woke up feeling normal.  Also has slurred speech on exam.  Initiated stroke alert and w/u with concern for VAN based on my exam.  CT with occlusion of proximal M2 MCA, awaiting neuro recs   [MT]  1907 Spoke with internal medicine team who will admit for stroke workup patient remains stable.   [KM]    Clinical Course User Index [KM] Arlyn Dunning, PA-C [MT] Renaye Rakers Kermit Balo, MD   MDM Rules/Calculators/A&P                      CRITICAL CARE Performed by: Arlyn Dunning   Total critical care time: 35 minutes  Critical care time was exclusive of separately billable procedures and treating other patients.  Critical  care was necessary to treat or prevent imminent or life-threatening deterioration.  Critical care was time spent personally by me on the following activities: development of treatment plan with patient and/or surrogate as  well as nursing, discussions with consultants, evaluation of patient's response to treatment, examination of patient, obtaining history from patient or surrogate, ordering and performing treatments and interventions, ordering and review of laboratory studies, ordering and review of radiographic studies, pulse oximetry and re-evaluation of patient's condition.  Final Clinical Impression(s) / ED Diagnoses Final diagnoses:  None    Rx / DC Orders ED Discharge Orders    None       Jeral Pinch 03/13/19 2340    Terald Sleeper, MD 03/14/19 1134

## 2019-03-13 NOTE — ED Notes (Signed)
Paged Dr. Kirkpatrick per RN  

## 2019-03-13 NOTE — ED Notes (Signed)
Activated code stroke with cassie from [facility]  

## 2019-03-13 NOTE — ED Notes (Signed)
Pt transported to CT ?

## 2019-03-13 NOTE — Plan of Care (Signed)

## 2019-03-14 ENCOUNTER — Inpatient Hospital Stay (HOSPITAL_COMMUNITY): Payer: Medicare HMO

## 2019-03-14 DIAGNOSIS — I10 Essential (primary) hypertension: Secondary | ICD-10-CM

## 2019-03-14 DIAGNOSIS — E039 Hypothyroidism, unspecified: Secondary | ICD-10-CM

## 2019-03-14 DIAGNOSIS — E1165 Type 2 diabetes mellitus with hyperglycemia: Secondary | ICD-10-CM

## 2019-03-14 DIAGNOSIS — R221 Localized swelling, mass and lump, neck: Secondary | ICD-10-CM

## 2019-03-14 DIAGNOSIS — Z7984 Long term (current) use of oral hypoglycemic drugs: Secondary | ICD-10-CM

## 2019-03-14 DIAGNOSIS — G8191 Hemiplegia, unspecified affecting right dominant side: Secondary | ICD-10-CM

## 2019-03-14 DIAGNOSIS — R4781 Slurred speech: Secondary | ICD-10-CM

## 2019-03-14 DIAGNOSIS — I82402 Acute embolism and thrombosis of unspecified deep veins of left lower extremity: Secondary | ICD-10-CM

## 2019-03-14 DIAGNOSIS — I639 Cerebral infarction, unspecified: Secondary | ICD-10-CM

## 2019-03-14 DIAGNOSIS — I82409 Acute embolism and thrombosis of unspecified deep veins of unspecified lower extremity: Secondary | ICD-10-CM | POA: Diagnosis present

## 2019-03-14 DIAGNOSIS — N179 Acute kidney failure, unspecified: Secondary | ICD-10-CM

## 2019-03-14 DIAGNOSIS — I82412 Acute embolism and thrombosis of left femoral vein: Secondary | ICD-10-CM

## 2019-03-14 DIAGNOSIS — Z89611 Acquired absence of right leg above knee: Secondary | ICD-10-CM

## 2019-03-14 DIAGNOSIS — E1151 Type 2 diabetes mellitus with diabetic peripheral angiopathy without gangrene: Secondary | ICD-10-CM

## 2019-03-14 DIAGNOSIS — Z79899 Other long term (current) drug therapy: Secondary | ICD-10-CM

## 2019-03-14 DIAGNOSIS — I6389 Other cerebral infarction: Secondary | ICD-10-CM

## 2019-03-14 DIAGNOSIS — Z79890 Hormone replacement therapy (postmenopausal): Secondary | ICD-10-CM

## 2019-03-14 DIAGNOSIS — I739 Peripheral vascular disease, unspecified: Secondary | ICD-10-CM

## 2019-03-14 DIAGNOSIS — E7800 Pure hypercholesterolemia, unspecified: Secondary | ICD-10-CM

## 2019-03-14 DIAGNOSIS — E785 Hyperlipidemia, unspecified: Secondary | ICD-10-CM

## 2019-03-14 DIAGNOSIS — R1310 Dysphagia, unspecified: Secondary | ICD-10-CM

## 2019-03-14 DIAGNOSIS — R471 Dysarthria and anarthria: Secondary | ICD-10-CM

## 2019-03-14 DIAGNOSIS — E1140 Type 2 diabetes mellitus with diabetic neuropathy, unspecified: Secondary | ICD-10-CM

## 2019-03-14 LAB — CBC
HCT: 33.2 % — ABNORMAL LOW (ref 36.0–46.0)
Hemoglobin: 10.6 g/dL — ABNORMAL LOW (ref 12.0–15.0)
MCH: 30.4 pg (ref 26.0–34.0)
MCHC: 31.9 g/dL (ref 30.0–36.0)
MCV: 95.1 fL (ref 80.0–100.0)
Platelets: 330 10*3/uL (ref 150–400)
RBC: 3.49 MIL/uL — ABNORMAL LOW (ref 3.87–5.11)
RDW: 13.5 % (ref 11.5–15.5)
WBC: 7.6 10*3/uL (ref 4.0–10.5)
nRBC: 0 % (ref 0.0–0.2)

## 2019-03-14 LAB — URINALYSIS, ROUTINE W REFLEX MICROSCOPIC
Bilirubin Urine: NEGATIVE
Glucose, UA: 50 mg/dL — AB
Hgb urine dipstick: NEGATIVE
Ketones, ur: NEGATIVE mg/dL
Leukocytes,Ua: NEGATIVE
Nitrite: NEGATIVE
Protein, ur: NEGATIVE mg/dL
Specific Gravity, Urine: 1.044 — ABNORMAL HIGH (ref 1.005–1.030)
pH: 5 (ref 5.0–8.0)

## 2019-03-14 LAB — GLUCOSE, CAPILLARY
Glucose-Capillary: 107 mg/dL — ABNORMAL HIGH (ref 70–99)
Glucose-Capillary: 111 mg/dL — ABNORMAL HIGH (ref 70–99)
Glucose-Capillary: 114 mg/dL — ABNORMAL HIGH (ref 70–99)
Glucose-Capillary: 123 mg/dL — ABNORMAL HIGH (ref 70–99)
Glucose-Capillary: 139 mg/dL — ABNORMAL HIGH (ref 70–99)
Glucose-Capillary: 206 mg/dL — ABNORMAL HIGH (ref 70–99)

## 2019-03-14 LAB — BASIC METABOLIC PANEL
Anion gap: 8 (ref 5–15)
BUN: 12 mg/dL (ref 8–23)
CO2: 27 mmol/L (ref 22–32)
Calcium: 8.8 mg/dL — ABNORMAL LOW (ref 8.9–10.3)
Chloride: 107 mmol/L (ref 98–111)
Creatinine, Ser: 0.97 mg/dL (ref 0.44–1.00)
GFR calc Af Amer: >60 mL/min (ref >60)
GFR calc non Af Amer: 59 mL/min — ABNORMAL LOW (ref >60)
Glucose, Bld: 120 mg/dL — ABNORMAL HIGH (ref 70–99)
Potassium: 4.3 mmol/L (ref 3.5–5.1)
Sodium: 142 mmol/L (ref 135–145)

## 2019-03-14 LAB — HEMOGLOBIN A1C
Hgb A1c MFr Bld: 11.1 % — ABNORMAL HIGH (ref 4.8–5.6)
Mean Plasma Glucose: 271.87 mg/dL

## 2019-03-14 LAB — RAPID URINE DRUG SCREEN, HOSP PERFORMED
Amphetamines: NOT DETECTED
Barbiturates: NOT DETECTED
Benzodiazepines: NOT DETECTED
Cocaine: NOT DETECTED
Opiates: NOT DETECTED
Tetrahydrocannabinol: NOT DETECTED

## 2019-03-14 LAB — LIPID PANEL
Cholesterol: 156 mg/dL (ref 0–200)
HDL: 36 mg/dL — ABNORMAL LOW (ref >40)
LDL Cholesterol: 92 mg/dL (ref 0–99)
Total CHOL/HDL Ratio: 4.3 RATIO
Triglycerides: 139 mg/dL (ref <150)
VLDL: 28 mg/dL (ref 0–40)

## 2019-03-14 LAB — ECHOCARDIOGRAM COMPLETE
Height: 68 in
Weight: 2373.91 oz

## 2019-03-14 LAB — MAGNESIUM: Magnesium: 1.6 mg/dL — ABNORMAL LOW (ref 1.7–2.4)

## 2019-03-14 MED ORDER — APIXABAN 5 MG PO TABS
5.0000 mg | ORAL_TABLET | Freq: Two times a day (BID) | ORAL | Status: DC
  Administered 2019-03-14 – 2019-03-20 (×13): 5 mg via ORAL
  Filled 2019-03-14 (×13): qty 1

## 2019-03-14 MED ORDER — GABAPENTIN 300 MG PO CAPS
600.0000 mg | ORAL_CAPSULE | Freq: Two times a day (BID) | ORAL | Status: DC
  Administered 2019-03-14 – 2019-03-15 (×4): 600 mg via ORAL
  Filled 2019-03-14 (×4): qty 2

## 2019-03-14 MED ORDER — CHLORHEXIDINE GLUCONATE CLOTH 2 % EX PADS: 6.0000 | MEDICATED_PAD | Freq: Every day | CUTANEOUS | Status: DC

## 2019-03-14 MED ORDER — MAGNESIUM SULFATE 2 GM/50ML IV SOLN
2.0000 g | Freq: Once | INTRAVENOUS | Status: AC
  Administered 2019-03-14: 06:00:00 2 g via INTRAVENOUS
  Filled 2019-03-14: qty 50

## 2019-03-14 MED ORDER — ADULT MULTIVITAMIN W/MINERALS CH
1.0000 | ORAL_TABLET | Freq: Every day | ORAL | Status: DC
  Administered 2019-03-14 – 2019-03-20 (×7): 1 via ORAL
  Filled 2019-03-14 (×7): qty 1

## 2019-03-14 NOTE — Progress Notes (Signed)
Initial Nutrition Assessment  RD working remotely.   DOCUMENTATION CODES:   Not applicable  INTERVENTION:   Snacks TID  MVI daily  Educated on importance of adequate calorie/protein intake   NUTRITION DIAGNOSIS:   Inadequate oral intake related to poor appetite as evidenced by per patient/family report.    GOAL:   Patient will meet greater than or equal to 90% of their needs    MONITOR:   PO intake, Weight trends, Labs, I & O's  REASON FOR ASSESSMENT:   Malnutrition Screening Tool    ASSESSMENT:   Pt with a PMH significant for PVD s/p R AKA, HTN, HLD, hypothyroidism, T2DM who presented with right-sided weakness and slurred speech. Pt admitted with acute left MCA CVA.  No PO intake documented.   Pt reports appetite is good now, but states she only ate 1 meal per day (typically a [employer]) when at home. Pt states she only eats 1 meal per day because she snacks throughout the rest of the day and lists pretzels, pistachios, potato chips, etc. Pt declining Ensure at this time, but is agreeable to snacks.    Pt reports her UBW is 140-144 lbs. Based on wts available in chart, pt has lost ~4% since 04/2018, which is not significant for time frame.   Medications reviewed and include: Foltx, SSI  Labs reviewed: Mg 1.6 (L) CBGs 107-139  UOP: x24 hours I/O: - since admit   NUTRITION - FOCUSED PHYSICAL EXAM:  Unable to perform at this time.   Diet Order:   Diet Order            Diet regular Room service appropriate? Yes with Assist; Fluid consistency: Thin  Diet effective now              EDUCATION NEEDS:   Education needs have been addressed  Skin:  Skin Assessment: Reviewed RN Assessment  Last BM:  03/09/19  Height:   Ht Readings from Last 1 Encounters:  03/13/19 5\' 8"  (1.727 m)    Weight:   Wt Readings from Last 1 Encounters:  03/14/19 67.3 kg    BMI:  Body mass index is 22.56 kg/m.  Estimated Nutritional Needs:    Kcal:  1500-1700  Protein:  75-85 grams  Fluid:  >1.5L/d   03/16/19, MS, RD, LDN RD pager number and weekend/on-call pager number located in Amion.

## 2019-03-14 NOTE — Progress Notes (Signed)
  Echocardiogram 2D Echocardiogram has been performed.  Delcie Roch 03/14/2019, 8:56 AM

## 2019-03-14 NOTE — Progress Notes (Addendum)
STROKE TEAM PROGRESS NOTE   INTERVAL HISTORY No family at the bedside.  Patient sitting in bed for lunch.  She still has mild right arm weakness, but no right leg weakness.  Of note, she had right AKA.  LE venous Doppler showed extensive left LE acute DVT.  Put on Eliquis.  Plan to do TCD bubble study on Monday, and PT/OT recommend SNF, however, patient adamant about she is going home.  Vitals:   03/14/19 0348 03/14/19 0758 03/14/19 1207 03/14/19 1332  BP:  137/71 129/70   Pulse:  78 73 80  Resp:  19 19   Temp:  98.8 F (37.1 C) 99.3 F (37.4 C)   TempSrc:  Oral Oral   SpO2:  99% 94% 95%  Weight: 67.3 kg     Height:        CBC:  Recent Labs  Lab 03/13/19 1628 03/13/19 1628 03/13/19 1645 03/14/19 0341  WBC 8.5  --   --  7.6  NEUTROABS 5.9  --   --   --   HGB 11.2*   < > 11.6* 10.6*  HCT 35.1*   < > 34.0* 33.2*  MCV 95.1  --   --  95.1  PLT 354  --   --  330   < > = values in this interval not displayed.    Basic Metabolic Panel:  Recent Labs  Lab 03/13/19 1628 03/13/19 1628 03/13/19 1645 03/14/19 0341  NA 141   < > 141 142  K 4.3   < > 4.2 4.3  CL 104   < > 104 107  CO2 26  --   --  27  GLUCOSE 164*   < > 159* 120*  BUN 13   < > 14 12  CREATININE 1.19*   < > 1.10* 0.97  CALCIUM 9.4  --   --  8.8*  MG  --   --   --  1.6*   < > = values in this interval not displayed.   Lipid Panel:     Component Value Date/Time   CHOL 156 03/14/2019 0341   TRIG 139 03/14/2019 0341   HDL 36 (L) 03/14/2019 0341   CHOLHDL 4.3 03/14/2019 0341   VLDL 28 03/14/2019 0341   LDLCALC 92 03/14/2019 0341   HgbA1c:  Lab Results  Component Value Date   HGBA1C 11.1 (H) 03/14/2019   Urine Drug Screen:     Component Value Date/Time   LABOPIA NONE DETECTED 03/14/2019 0516   COCAINSCRNUR NONE DETECTED 03/14/2019 0516   LABBENZ NONE DETECTED 03/14/2019 0516   AMPHETMU NONE DETECTED 03/14/2019 0516   THCU NONE DETECTED 03/14/2019 0516   LABBARB NONE DETECTED 03/14/2019 0516     Alcohol Level No results found for: ETH  IMAGING past 48 hours CT Code Stroke CTA Head W/WO contrast  Result Date: 03/13/2019 CLINICAL DATA:  Stroke code. EXAM: CT ANGIOGRAPHY HEAD AND NECK CT PERFUSION BRAIN TECHNIQUE: Multidetector CT imaging of the head and neck was performed using the standard protocol during bolus administration of intravenous contrast. Multiplanar CT image reconstructions and MIPs were obtained to evaluate the vascular anatomy. Carotid stenosis measurements (when applicable) are obtained utilizing NASCET criteria, using the distal internal carotid diameter as the denominator. Multiphase CT imaging of the brain was performed following IV bolus contrast injection. Subsequent parametric perfusion maps were calculated using RAPID software. CONTRAST:  Administered contrast not known at this time. COMPARISON:  Concurrently performed noncontrast head CT. FINDINGS: CTA NECK FINDINGS  Aortic arch: There is an aberrant right subclavian artery. Minimal calcified plaque within the visualized aortic arch and proximal major branch vessels of the neck. No innominate or proximal subclavian artery stenosis. Right carotid system: CCA and ICA patent within the neck without stenosis. Minimal mixed plaque at the carotid bifurcation. Partially retropharyngeal course of the ICA. Left carotid system: CCA and ICA patent within the neck without stenosis. Minimal mixed plaque at the carotid bifurcation. Partially retropharyngeal course of the ICA. Vertebral arteries: The vertebral arteries are patent within the neck. The left vertebral artery is slightly dominant. Soft plaque at the origin of the right vertebral artery results in moderate/severe ostial stenosis. Calcified plaque at the origin of the left vertebral artery results in mild/moderate ostial stenosis. Skeleton: No acute bony abnormality. Cervical spondylosis with multilevel posterior disc osteophytes, uncovertebral and facet hypertrophy. A prominent  posterior disc osteophyte complex at C5-C6 contributes to at least moderate spinal canal narrowing. Other neck: No cervical lymphadenopathy. 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 Upper chest: No consolidation within the imaged lung apices. Review of the MIP images confirms the above findings CTA HEAD FINDINGS Anterior circulation: The intracranial internal carotid arteries are patent with calcified plaque. Sites of up to moderate stenosis within the cavernous segments bilaterally. The M1 middle cerebral arteries are patent without significant stenosis. There is occlusion or near occlusion of a proximal to mid left M2 branch vessel (series 10, image 138) (series 8, images 74-67). The anterior cerebral arteries are patent without high-grade stenosis. No intracranial aneurysm is identified. Posterior circulation: The intracranial vertebral arteries are patent without significant stenosis. Calcified plaque within the V4 left vertebral artery results in mild stenosis. The basilar artery is patent without significant stenosis. The posterior cerebral arteries are patent without significant proximal stenosis. Posterior communicating arteries are poorly delineated and may be hypoplastic or absent bilaterally. Venous sinuses: Within limitations of contrast timing, no convincing thrombus. Anatomic variants: As described Review of the MIP images confirms the above findings Findings of a proximal to mid left M2 branch occlusion or near occlusion called by telephone at the time of interpretation on 03/13/2019 at 5:29 pm to provider [facility] , who verbally acknowledged these results. IMPRESSION: CTA neck: 1. The bilateral common and internal carotid arteries are patent within the neck without stenosis. Mild mixed plaque at the carotid bifurcations. 2. The vertebral arteries are  patent within the neck bilaterally. Moderate/severe atherosclerotic narrowing at the origin of the right vertebral artery. Mild/moderate atherosclerotic narrowing at the origin of the left vertebral artery. 3. Incidentally noted aberrant right subclavian artery. 4. 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 CTA head: 1. Occlusion or near occlusion of a proximal to mid left M2 MCA branch vessel as described. 2. 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 Sites of up to moderate stenosis within the cavernous segments bilaterally. 3. Mild atherosclerotic narrowing of the V4 left vertebral artery. Electronically Signed   By: Jackey Loge DO   On: 03/13/2019 17:39   CT Code Stroke CTA Neck W/WO contrast  Result Date: 03/13/2019 CLINICAL DATA:  Stroke code. EXAM: CT ANGIOGRAPHY HEAD AND NECK CT PERFUSION BRAIN TECHNIQUE: Multidetector CT imaging of the head and neck was performed using the standard protocol during bolus administration of intravenous contrast. Multiplanar CT image  reconstructions and MIPs were obtained to evaluate the vascular anatomy. Carotid stenosis measurements (when applicable) are obtained utilizing NASCET criteria, using the distal internal carotid diameter as the denominator. Multiphase CT imaging of the brain was performed following IV bolus contrast injection. Subsequent parametric perfusion maps were calculated using RAPID software. CONTRAST:  Administered contrast not known at this time. COMPARISON:  Concurrently performed noncontrast head CT. FINDINGS: CTA NECK FINDINGS Aortic arch: There is an aberrant right subclavian artery. Minimal calcified plaque within the visualized aortic arch and proximal major branch vessels of the neck. No innominate or proximal subclavian artery stenosis. Right carotid system: CCA and ICA patent within the neck without stenosis. Minimal mixed plaque at the  carotid bifurcation. Partially retropharyngeal course of the ICA. Left carotid system: CCA and ICA patent within the neck without stenosis. Minimal mixed plaque at the carotid bifurcation. Partially retropharyngeal course of the ICA. Vertebral arteries: The vertebral arteries are patent within the neck. The left vertebral artery is slightly dominant. Soft plaque at the origin of the right vertebral artery results in moderate/severe ostial stenosis. Calcified plaque at the origin of the left vertebral artery results in mild/moderate ostial stenosis. Skeleton: No acute bony abnormality. Cervical spondylosis with multilevel posterior disc osteophytes, uncovertebral and facet hypertrophy. A prominent posterior disc osteophyte complex at C5-C6 contributes to at least moderate spinal canal narrowing. Other neck: No cervical lymphadenopathy. 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 Upper chest: No consolidation within the imaged lung apices. Review of the MIP images confirms the above findings CTA HEAD FINDINGS Anterior circulation: The intracranial internal carotid arteries are patent with calcified plaque. Sites of up to moderate stenosis within the cavernous segments bilaterally. The M1 middle cerebral arteries are patent without significant stenosis. There is occlusion or near occlusion of a proximal to mid left M2 branch vessel (series 10, image 138) (series 8, images 74-67). The anterior cerebral arteries are patent without high-grade stenosis. No intracranial aneurysm is identified. Posterior circulation: The intracranial vertebral arteries are patent without significant stenosis. Calcified plaque within the V4 left vertebral artery results in mild stenosis. The basilar artery is patent without significant stenosis. The posterior cerebral arteries are patent without significant  proximal stenosis. Posterior communicating arteries are poorly delineated and may be hypoplastic or absent bilaterally. Venous sinuses: Within limitations of contrast timing, no convincing thrombus. Anatomic variants: As described Review of the MIP images confirms the above findings Findings of a proximal to mid left M2 branch occlusion or near occlusion called by telephone at the time of interpretation on 03/13/2019 at 5:29 pm to [facility] , who verbally acknowledged these results. IMPRESSION: CTA neck: 1. The bilateral common and internal carotid arteries are patent within the neck without stenosis. Mild mixed plaque at the carotid bifurcations. 2. The vertebral arteries are patent within the neck bilaterally. Moderate/severe atherosclerotic narrowing at the origin of the right vertebral artery. Mild/moderate atherosclerotic narrowing at the origin of the left vertebral artery. 3. Incidentally noted aberrant right subclavian artery. 4. 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 CTA head: 1. Occlusion or near occlusion of a proximal to mid left M2 MCA branch vessel as described. 2. 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 Sites of up to moderate stenosis within the cavernous segments bilaterally. 3. Mild atherosclerotic narrowing  of the V4 left vertebral artery. Electronically Signed   By: Jackey Loge DO   On: 03/13/2019 17:39   MR BRAIN WO CONTRAST  Result Date: 03/13/2019 CLINICAL DATA:  Stroke, follow-up EXAM: MRI HEAD WITHOUT CONTRAST TECHNIQUE: Multiplanar, multiecho pulse sequences of the brain and surrounding structures were obtained without intravenous contrast. COMPARISON:  Correlation made with CT imaging earlier same day FINDINGS: Brain: There are small areas of restricted diffusion within the left frontal lobe including involvement of the corona radiata and precentral gyrus.  Patchy and confluent areas of T2 hyperintensity in the supratentorial and pontine white matter are nonspecific but probably reflect moderate chronic microvascular ischemic changes. Prominence of the ventricles and sulci reflects generalized parenchymal volume loss. There is no intracranial hemorrhage. There is no intracranial mass, mass effect, or edema. There is no hydrocephalus or extra-axial fluid collection. Vascular: Major vessel flow voids at the skull base are preserved. Susceptibility along the distal left M2 MCA distribution likely corresponds to area of thrombus on CTA. Skull and upper cervical spine: Normal marrow signal is preserved. Sinuses/Orbits: Minor mucosal thickening. Bilateral lens replacements. Other: Sella is unremarkable.  Mastoid air cells are clear. IMPRESSION: Small acute infarcts within the left frontal lobe. Moderate chronic microvascular ischemic changes. Electronically Signed   By: Guadlupe Spanish M.D.   On: 03/13/2019 21:03   CT CEREBRAL PERFUSION W CONTRAST  Result Date: 03/13/2019 CLINICAL DATA:  Neuro deficit, subacute. EXAM: CT PERFUSION BRAIN TECHNIQUE: Multiphase CT imaging of the brain was performed following IV bolus contrast injection. Subsequent parametric perfusion maps were calculated using RAPID software. CONTRAST:  17mL OMNIPAQUE IOHEXOL 350 MG/ML SOLN COMPARISON:  Noncontrast head CT and CT angiogram head/neck performed earlier the same day. FINDINGS: CT Brain Perfusion Findings: CBF (<30%) Volume: 59mL Perfusion (Tmax>6.0s) volume: 34mL in the left frontal lobe MCA territory (utilizing the Tmax greater than 4 seconds threshold, there is 21 mL of hypoperfusion in this region). Mismatch Volume: 48mL ASPECTS on noncontrast CT Head: 10 at 4:53 p.m. today. Infarct Core: 0 mL Infarction Location:None identified These results were communicated to Dr. Amada Jupiter At 6:09 pmon 2/25/2021by text page via the [facility] messaging system. IMPRESSION: The perfusion software identifies a 6  mL region of critically hypoperfused parenchyma in the left frontal lobe MCA vascular territory utilizing the Tmax>6 seconds threshold. The perfusion software identifies no core infarct. Reported mismatch volume 6 mL. Of note, utilizing a Tmax > 4 seconds threshold, there is a 21 mL region of hypoperfusion in the left MCA territory. Electronically Signed   By: Jackey Loge DO   On: 03/13/2019 18:16   ECHOCARDIOGRAM COMPLETE  Result Date: 03/14/2019    ECHOCARDIOGRAM REPORT   Patient Name:   COMFORT EMDE Date of Exam: 03/14/2019 Medical Rec #:  [id number]         Height:       68.0 in Accession #:    [id number]        Weight:       148.4 lb Date of Birth:  Oct 13, 1945        BSA:          1.800 m Patient Age:    73 years          BP:           130/73 mmHg Patient Gender: F                 HR:           61 bpm. Exam  Location:  Inpatient Procedure: 2D Echo Indications:    stroke 434.91  History:        Patient has no prior history of Echocardiogram examinations.                 Risk Factors:Hypertension and Dyslipidemia.  Sonographer:    Johny Chess Referring Phys: [id number] ALEXANDER N RAINES IMPRESSIONS  1. Abnormal septal motion. Left ventricular ejection fraction, by estimation, is 50 to 55%. The left ventricle has low normal function. The left ventricle has no regional wall motion abnormalities. Left ventricular diastolic parameters were normal.  2. Right ventricular systolic function is normal. The right ventricular size is normal.  3. Bubble study not performed.  4. The mitral valve is normal in structure and function. No evidence of mitral valve regurgitation. No evidence of mitral stenosis.  5. The aortic valve is normal in structure and function. Aortic valve regurgitation is not visualized. No aortic stenosis is present.  6. The inferior vena cava is normal in size with greater than 50% respiratory variability, suggesting right atrial pressure of 3 mmHg. FINDINGS  Left Ventricle: Abnormal septal  motion. Left ventricular ejection fraction, by estimation, is 50 to 55%. The left ventricle has low normal function. The left ventricle has no regional wall motion abnormalities. The left ventricular internal cavity size was normal in size. There is no left ventricular hypertrophy. Left ventricular diastolic parameters were normal. Right Ventricle: The right ventricular size is normal. No increase in right ventricular wall thickness. Right ventricular systolic function is normal. Left Atrium: Left atrial size was normal in size. Right Atrium: Right atrial size was normal in size. Pericardium: There is no evidence of pericardial effusion. Mitral Valve: The mitral valve is normal in structure and function. Normal mobility of the mitral valve leaflets. No evidence of mitral valve regurgitation. No evidence of mitral valve stenosis. Tricuspid Valve: The tricuspid valve is normal in structure. Tricuspid valve regurgitation is not demonstrated. No evidence of tricuspid stenosis. Aortic Valve: The aortic valve is normal in structure and function. Aortic valve regurgitation is not visualized. No aortic stenosis is present. Pulmonic Valve: The pulmonic valve was normal in structure. Pulmonic valve regurgitation is not visualized. No evidence of pulmonic stenosis. Aorta: The aortic root is normal in size and structure. Venous: The inferior vena cava is normal in size with greater than 50% respiratory variability, suggesting right atrial pressure of 3 mmHg. IAS/Shunts: No atrial level shunt detected by color flow Doppler.  LEFT VENTRICLE PLAX 2D LVIDd:         4.10 cm  Diastology LVIDs:         2.70 cm  LV e' lateral:   10.00 cm/s LV PW:         0.80 cm  LV E/e' lateral: 8.2 LV IVS:        0.70 cm  LV e' medial:    8.81 cm/s LVOT diam:     1.90 cm  LV E/e' medial:  9.3 LV SV:         50 LV SV Index:   28 LVOT Area:     2.84 cm  RIGHT VENTRICLE RV S prime:     12.60 cm/s TAPSE (M-mode): 1.8 cm LEFT ATRIUM             Index LA  diam:        2.90 cm 1.61 cm/m LA Vol (A2C):   49.6 ml 27.55 ml/m LA Vol (A4C):   33.4 ml 18.55 ml/m  LA Biplane Vol: 41.7 ml 23.16 ml/m  AORTIC VALVE LVOT Vmax:   95.30 cm/s LVOT Vmean:  58.900 cm/s LVOT VTI:    0.178 m  AORTA Ao Root diam: 2.70 cm MITRAL VALVE MV Area (PHT): 3.77 cm    SHUNTS MV Decel Time: 201 msec    Systemic VTI:  0.18 m MV E velocity: 82.00 cm/s  Systemic Diam: 1.90 cm MV A velocity: 78.10 cm/s MV E/A ratio:  1.05 Charlton HawsPeter Nishan MD Electronically signed by Charlton HawsPeter Nishan MD Signature Date/Time: 03/14/2019/9:08:55 AM    Final    CT HEAD CODE STROKE WO CONTRAST  Result Date: 03/13/2019 CLINICAL DATA:  Code stroke. Focal neuro deficit, greater than 6 hours, stroke suspected. Additional history provided: Last known normal 23:00, slurred speech, right-sided weakness. EXAM: CT HEAD WITHOUT CONTRAST TECHNIQUE: Contiguous axial images were obtained from the base of the skull through the vertex without intravenous contrast. COMPARISON:  No pertinent prior studies available for comparison. FINDINGS: Brain: There is no evidence of acute intracranial hemorrhage, intracranial mass, midline shift or extra-axial fluid collection. No demarcated cortical infarction is identified. Moderate/advanced patchy and confluent hypodensity within the cerebral white matter is nonspecific, but consistent with chronic small vessel ischemic disease. Moderate generalized parenchymal atrophy. Vascular: No hyperdense vessel.  Atherosclerotic calcifications. Skull: Normal. Negative for fracture or focal lesion. Sinuses/Orbits: Visualized orbits demonstrate no acute abnormality. Small left sphenoid sinus mucous retention cyst. No significant mastoid effusion. These results were communicated to Dr. Amada JupiterKirkpatrick At 5:09 pmon 2/25/2021by text page via the [facility]. IMPRESSION: No CT evidence of acute intracranial abnormality. Moderate/advanced chronic small vessel ischemic disease. Moderate generalized parenchymal  atrophy Electronically Signed   By: Jackey LogeKyle  Golden DO   On: 03/13/2019 17:09   VAS US LOWER EXTREMITY VENOUS (DVT)  Result Date: 03/14/2019  Lower Venous DVTStudy Indications: Stroke.  Risk Factors: Surgery Right above knee amputation. Limitations: Poor ultrasound/tissue interface and body habitus. Comparison Study: No prior exam. Performing Technologist: Kennedy BuckerKristy Siebrecht ARDMS, RVT  Examination Guidelines: A complete evaluation includes B-mode imaging, spectral Doppler, color Doppler, and power Doppler as needed of all accessible portions of each vessel. Bilateral testing is considered an integral part of a complete examination. Limited examinations for reoccurring indications may be performed as noted. The reflux portion of the exam is performed with the patient in reverse Trendelenburg.  +-------+---------------+---------+-----------+----------+--------------+ RIGHT  CompressibilityPhasicitySpontaneityPropertiesThrombus Aging +-------+---------------+---------+-----------+----------+--------------+ CFV    Full           Yes      Yes                                 +-------+---------------+---------+-----------+----------+--------------+ SFJ                                                 Not visualized +-------+---------------+---------+-----------+----------+--------------+ FV ProxFull                                                        +-------+---------------+---------+-----------+----------+--------------+ Poory visualized, possibly due to above knee amputation.  +---------+---------------+---------+-----------+----------+-------------------+ LEFT     CompressibilityPhasicitySpontaneityPropertiesThrombus Aging      +---------+---------------+---------+-----------+----------+-------------------+ CFV  Partial        Yes      Yes                  Acute               +---------+---------------+---------+-----------+----------+-------------------+ SFJ       Partial                                                          +---------+---------------+---------+-----------+----------+-------------------+ FV Prox  Partial                                                          +---------+---------------+---------+-----------+----------+-------------------+ FV Mid   Partial                                                          +---------+---------------+---------+-----------+----------+-------------------+ FV DistalPartial                                                          +---------+---------------+---------+-----------+----------+-------------------+ PFV      Partial                                                          +---------+---------------+---------+-----------+----------+-------------------+ POP      Partial        No       No                   continuous flow     +---------+---------------+---------+-----------+----------+-------------------+ PTV                                                   poorly visualized,                                                        no color flow       +---------+---------------+---------+-----------+----------+-------------------+ PERO                                                  poorly visualized,  no color flow       +---------+---------------+---------+-----------+----------+-------------------+ EIV      Partial        Yes      Yes                                      +---------+---------------+---------+-----------+----------+-------------------+ IVC                                                   Not visualized      +---------+---------------+---------+-----------+----------+-------------------+     Summary: RIGHT: - There is no evidence of deep vein thrombosis in the CFV, FV prox, and prof. Right above knee amputation.  LEFT: - Findings consistent with acute deep vein  thrombosis involving the left common femoral vein, SF junction, left femoral vein, left proximal profunda vein, left popliteal vein, left posterior tibial veins, left peroneal veins, and EIV. - No cystic structure found in the popliteal fossa.  *See table(s) above for measurements and observations.    Preliminary     PHYSICAL EXAM  Temp:  [97.8 F (36.6 C)-99.3 F (37.4 C)] 99.3 F (37.4 C) (02/26 1207) Pulse Rate:  [66-94] 80 (02/26 1332) Resp:  [14-35] 19 (02/26 1207) BP: (117-137)/(58-74) 129/70 (02/26 1207) SpO2:  [94 %-99 %] 95 % (02/26 1332) Weight:  [63.4 kg-70.3 kg] 67.3 kg (02/26 0348)  General - Well nourished, well developed, in no apparent distress.  Ophthalmologic - fundi not visualized due to noncooperation.  Cardiovascular - Regular rhythm and rate.  Mental Status -  Level of arousal and orientation to time, place, and person were intact. Language including expression, naming, repetition, comprehension was assessed and found intact.  Mild to moderate dysarthria Fund of Knowledge was assessed and was intact.  Cranial Nerves II - XII - II - Visual field intact OU. III, IV, VI - Extraocular movements intact. V - Facial sensation intact bilaterally. VII - mild right facial droop. VIII - Hearing & vestibular intact bilaterally. X - Palate elevates symmetrically. XI - Chin turning & shoulder shrug intact bilaterally. XII - Tongue protrusion intact.  Motor Strength - The patient's strength was normal in all extremities except right pronator drift and right finger grip 4/5.  Right lower extremity AKA but strength 5/5 proximally.  Bulk was normal and fasciculations were absent.   Motor Tone - Muscle tone was assessed at the neck and appendages and was normal.  Reflexes - The patient's reflexes were symmetrical in all extremities and she had no pathological reflexes.  Sensory - Light touch, temperature/pinprick were assessed and were symmetrical.    Coordination - The  patient had normal movements in the hands with no ataxia or dysmetria.  Tremor was absent.  Gait and Station - deferred.   ASSESSMENT/PLAN Ms. Cornelia Walraven is a 73 y.o. female with history of hypertension, hyperlipidemia, diabetes presenting with R sided weakness present on awakening.   Stroke:   Small L frontal lobe infarcts, embolic pattern, could be due to extensive LE DVT with ? PFO or occult afib if no PFO  Code Stroke CT head No acute abnormality. Small vessel disease. Atrophy.   CTA head proximal to mid L M2 one of the non-dominant branch occlusion/near occlusion. Intracranial ICA atherosclerosis. B cavernous ICAs w/ moderate stenosis. Mild  atherosclerosis L V4.   CTA neck mixed plaque at ICA bifurcations. Origin R VA moderate/severe stenosis. Aberrant R subclavian. 15mm soft tissue mass neck midline beneath hyoid.   CT perfusion 6mL hypoperfusion L frontal lobe. No core. Mismatch 6mL.   MRI  Small L frontal lobe infarcts. Moderate small vessel disease.   2D Echo EF 50-55%. No source of embolus   LE doppler R AKA, L DVT common femoral, SF jxn, femoral, proximal profunda, popliteal, posterior tib, peroneal and EIV.  Recommend TCD bubble study to rule out PFO.  If patient able to stay until next Monday we will do TCD bubble study on Monday, otherwise will do as outpatient.  Recommend lifelong anticoagulation given DVT with the activity, no further cardioembolic work-up needed.  LDL 92  HgbA1c 11.1  Eliquis for VTE prophylaxis  aspirin 81 mg daily prior to admission, now on Eliquis 5 mg twice daily for DVT treatment as well as for stroke prevention.  Patient stroke is small, low risk of hemorrhagic conversion at this time.  Therapy recommendations:  SNF  Disposition:  pending   DVT  LE doppler R AKA, L DVT common femoral, SF jxn, femoral, proximal profunda, popliteal, posterior tib, peroneal and EIV.  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  Do not recommend 10 mg twice daily initial dosing due to acute stroke.  Hypertension  Stable . Permissive hypertension (OK if <180/105) but gradually normalize in 5-7 days . Long-term BP goal normotensive  Hyperlipidemia  Home meds:  lipitor 10  Now on lipitor 40  LDL 92, goal < 70  Continue statin at discharge  Diabetes type II Uncontrolled  HgbA1c 11.1, goal < 7.0  CBGs  SSI  Close PCP follow-up for better DM control  Tobacco abuse  Current smoker  Smoking cessation counseling provided  Pt is willing to quit  Other Stroke Risk Factors  Advanced age  ETOH use, advised to drink no more than 1 drink(s) a day  Other Active Problems  Hypothyroidism  W/ soft tissue mass on imaging  Right AKA  Hospital day # 1  Neurology will sign off. Please call with questions. Pt will follow up with stroke clinic NP at [facility] in about 4 weeks. Thanks for the consult.   Marvel Plan, MD PhD Stroke Neurology 03/14/2019 3:14 PM    To contact Stroke Continuity provider, please refer to [email or web address]. After hours, contact General Neurology

## 2019-03-14 NOTE — Evaluation (Signed)
Occupational Therapy Evaluation Patient Details Name: Emma [name] MRN: [id number] DOB: October 28, 1945 Today's Date: 03/14/2019    History of Present Illness The pt is a 74 yo female presenting due to onset of R-sided weakness and slurred speech. CT revelaed acute L MCA stroke and small infarcts in L frontal lobe. PMH includes: hypertension, hyperlipidemia, diabetes, and RLE AKA.   Clinical Impression   This 74 yo female admitted with above presents to acute OT with PLOF at a W/C level of Mod I and living alone. Now pt is min-total A for basic ADLs and transfers due to decreased balance, right sided weakness, and decreased safety awareness. She will benefit from acute OT with follow up at SNF to work back towards a Mod I level.    Follow Up Recommendations  SNF;Supervision/Assistance - 24 hour    Equipment Recommendations  Other (comment)(TBD next venue)       Precautions / Restrictions Precautions Precautions: Fall Precaution Comments: pt with previous R AKA, does not use prosthesis Restrictions Weight Bearing Restrictions: No      Mobility Bed Mobility Overal bed mobility: Needs Assistance Bed Mobility: Supine to Sit;Sit to Supine     Supine to sit: Min assist;HOB elevated Sit to supine: Min assist   General bed mobility comments: Pt able to come up to sit EOB with HOB up--but needed A to scoot forward to get RLE on floor  Transfers Overall transfer level: Needs assistance Equipment used: 1 person hand held assist Transfers: Sit to/from Stand Sit to Stand: Mod assist Stand pivot transfers: Mod assist;From elevated surface       General transfer comment: modA from elevated bed when pivoting on LLE to L. Pt with good reach of LUE to pull to chair.    Balance Overall balance assessment: Needs assistance Sitting-balance support: No upper extremity supported;Feet supported Sitting balance-Leahy Scale: Fair Sitting balance - Comments: pt able to maintain without  support, benefits from support   Standing balance support: Bilateral upper extremity supported Standing balance-Leahy Scale: Poor Standing balance comment: relaint on BUE support and modA of 1                           ADL either performed or assessed with clinical judgement   ADL Overall ADL's : Needs assistance/impaired Eating/Feeding: Supervision/ safety;Set up;Sitting   Grooming: Minimal assistance;Sitting   Upper Body Bathing: Minimal assistance;Sitting   Lower Body Bathing: Moderate assistance Lower Body Bathing Details (indicate cue type and reason): Mod A sit<>stand Upper Body Dressing : Moderate assistance;Sitting   Lower Body Dressing: Maximal assistance Lower Body Dressing Details (indicate cue type and reason): Mod A sit<>stand Toilet Transfer: Moderate assistance;Stand-pivot   Toileting- Clothing Manipulation and Hygiene: Total assistance Toileting - Clothing Manipulation Details (indicate cue type and reason): Mod A sit<>stand            Vision Baseline Vision/History: Wears glasses Wears Glasses: Reading only              Pertinent Vitals/Pain Pain Assessment: No/denies pain     Hand Dominance Left   Extremity/Trunk Assessment Upper Extremity Assessment Upper Extremity Assessment: RUE deficits/detail RUE Deficits / Details: Slower movements compared to LUE as well as lag when asked to raise both arms at same time. Grip 4/5 RUE Coordination: decreased fine motor;decreased gross motor     Communication Communication Communication: (mumbling of speech)   Cognition Arousal/Alertness: Awake/alert Behavior During Therapy: Flat affect Overall Cognitive Status: No family/caregiver present  to determine baseline cognitive functioning                                 General Comments: Followed all commands and oriented              Home Living Family/patient expects to be discharged to:: Skilled nursing facility Living  Arrangements: Alone Available Help at Discharge: (pt reports she does not have any friends or family that can assistt) Type of Home: Apartment Home Access: Ramped entrance     Home Layout: One level     Bathroom Shower/Tub: Tub/shower unit(but sponge baths)   Bathroom Toilet: Handicapped height Bathroom Accessibility: Yes   Home Equipment: Environmental consultant - 2 wheels;Cane - single point;Bedside commode;Wheelchair - manual   Additional Comments: pt reports she has a prosthesis but no longer uses it because she is unable to don it independently and has no one who can assist her      Prior Functioning/Environment Level of Independence: Independent with assistive device(s)        Comments: independent with WC for all ADLs and IADLs, works customer service        OT Problem List: Decreased strength;Decreased range of motion;Impaired balance (sitting and/or standing);Impaired UE functional use;Decreased safety awareness;Decreased cognition      OT Treatment/Interventions: Self-care/ADL training;Therapeutic exercise;Therapeutic activities;DME and/or AE instruction;Patient/family education;Balance training    OT Goals(Current goals can be found in the care plan section) Acute Rehab OT Goals Patient Stated Goal: to get back to work OT Goal Formulation: With patient Time For Goal Achievement: 03/28/19 Potential to Achieve Goals: Good  OT Frequency: Min 2X/week   Barriers to D/C: Decreased caregiver support             AM-PAC OT "6 Clicks" Daily Activity     Outcome Measure Help from another person eating meals?: A Little Help from another person taking care of personal grooming?: A Little Help from another person toileting, which includes using toliet, bedpan, or urinal?: A Lot Help from another person bathing (including washing, rinsing, drying)?: A Lot Help from another person to put on and taking off regular upper body clothing?: A Lot Help from another person to put on and  taking off regular lower body clothing?: Total 6 Click Score: 13   End of Session Equipment Utilized During Treatment: Gait belt  Activity Tolerance: Patient tolerated treatment well Patient left: in bed;with call bell/phone within reach;with bed alarm set  OT Visit Diagnosis: Unsteadiness on feet (R26.81);Other abnormalities of gait and mobility (R26.89);Muscle weakness (generalized) (M62.81)                Time: 7989-2119 OT Time Calculation (min): 25 min Charges:  OT General Charges $OT Visit: 1 Visit OT Evaluation $OT Eval Moderate Complexity: 1 Mod OT Treatments $Self Care/Home Management : 8-22 mins  [facility]  OTR/L Acute [employer] Pager [phone] Office [phone]     03/14/2019, 5:08 PM

## 2019-03-14 NOTE — Progress Notes (Signed)
Subjective:  O/N Events: Admitted   Emma [name] was evaluated at bedside. Patient states that she feels better than yesterday and is able to move her arm, and speak more. She feels that she is able to speak better She would like to go home and resume working.   Objective:  Vital signs in last 24 hours: Vitals:   03/14/19 0013 03/14/19 0155 03/14/19 0326 03/14/19 0348  BP: 122/61 126/62 130/73   Pulse: 71 66 75   Resp: 18 18 18    Temp: 97.8 F (36.6 C) 98.3 F (36.8 C) 98 F (36.7 C)   TempSrc: Oral Oral Oral   SpO2: 94% 94% 97%   Weight:    67.3 kg  Height:       Physical Exam Constitutional:      Comments: Dysarthric, sitting comfortably at bedside, no acute distress.   HENT:     Head: Normocephalic and atraumatic.     Nose: Nose normal.  Eyes:     General: No scleral icterus.       Right eye: No discharge.        Left eye: No discharge.     Conjunctiva/sclera: Conjunctivae normal.     Pupils: Pupils are equal, round, and reactive to light.  Cardiovascular:     Rate and Rhythm: Normal rate and regular rhythm.     Pulses: Normal pulses.     Heart sounds: Normal heart sounds. No murmur. No friction rub. No gallop.   Pulmonary:     Effort: Pulmonary effort is normal.     Breath sounds: Normal breath sounds.  Abdominal:     General: Abdomen is flat. Bowel sounds are normal.     Palpations: Abdomen is soft.     Tenderness: There is no abdominal tenderness.  Musculoskeletal:     Comments: 4/5: grip strength, and RUE strength.  5/5: in the LUE and LLE R. AKA  Neurological:     General: No focal deficit present.     Mental Status: She is alert and oriented to person, place, and time.      CBC Latest Ref Rng & Units 03/14/2019 03/13/2019 03/13/2019  WBC 4.0 - 10.5 K/uL 7.6 - 8.5  Hemoglobin 12.0 - 15.0 g/dL 10.6(L) 11.6(L) 11.2(L)  Hematocrit 36.0 - 46.0 % 33.2(L) 34.0(L) 35.1(L)  Platelets 150 - 400 K/uL 330 - 354   CMP Latest Ref Rng & Units 03/14/2019  03/13/2019 03/13/2019  Glucose 70 - 99 mg/dL 120(H) 159(H) 164(H)  BUN 8 - 23 mg/dL 12 14 13   Creatinine 0.44 - 1.00 mg/dL 0.97 1.10(H) 1.19(H)  Sodium 135 - 145 mmol/L 142 141 141  Potassium 3.5 - 5.1 mmol/L 4.3 4.2 4.3  Chloride 98 - 111 mmol/L 107 104 104  CO2 22 - 32 mmol/L 27 - 26  Calcium 8.9 - 10.3 mg/dL 8.8(L) - 9.4  Total Protein 6.5 - 8.1 g/dL - - 6.4(L)  Total Bilirubin 0.3 - 1.2 mg/dL - - 0.3  Alkaline Phos 38 - 126 U/L - - 74  AST 15 - 41 U/L - - 21  ALT 0 - 44 U/L - - 15   CBG (last 3)  Recent Labs    03/14/19 0018 03/14/19 0346  GLUCAP 123* 107*    Assessment/Plan:  Active Problems:   TIA (transient ischemic attack)   Stroke (cerebrum) (HCC)  L. MCA CVA:  Patient, with PMH PVD, HTN, HLD, DMT2, Hypothyroidism presented to [facility] with R sided weakness, dysphagia, and dysarthria with imaging  showing small acute infarcts within the L Frontal Lobe. - Permissive HTN window for 48 hours, hold all home HTN medications - Home does atorvastatin increased to 40 mg - Continue Aspirin 325 mg - Telemetry  - PT/OT/SLP evaluations ordered   Acute Kidney Injury:  Creatinine elevated to 1.19 on admission, given a 500 cc bolus of LR o/n.  - Cr: 0.97  Diabetes Mellitis Type II:  Patient with previous A1c of 6.4 now with 11.1 on admission. Was on 1500 mg QD of Metformin and glimepiride 4 mg QD at home with gabapentin and trileptal for diabetic neuropathy.  - A1c: 11.1 - SSI - Started back on 600 mg BID gabapentin for diabetic neuropathy  Hypothyroidism with Soft tissue Mass on Imaging:  Patient with 66mm soft tissue mass observed on CT neck below the hyoid bone. 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  - Continue IV Synthroid    Diet: Carb Modified Fluids: D/C DVT Prophylaxis Prior to Admission Living Arrangement: Home Anticipated Discharge Location: TBA Barriers to Discharge:  Dispo: Anticipated discharge in approximately 1-3 day(s).   Dolan Amen, MD 03/14/2019, 6:18  AM

## 2019-03-14 NOTE — TOC Initial Note (Signed)
Transition of Care [facility]) - Initial/Assessment Note    Patient Details  Name: Emma [name] MRN: [id number] Date of Birth: 10/22/1945  [facility]) CM/SW Contact:    Nonda Lou, LCSW Phone Number: 03/14/2019, 4:54 PM  Clinical Narrative:                 CSW received consult for possible SNF placement at time of discharge. CSW spoke with patient regarding PT recommendation of SNF placement at time of discharge. Patient expressed she is not going to a SNF and she plans to return home once she is medically ready for discharge. CSW asked patient if she has anyone who can assist once she returns home, she stated no. CSW asked if there is anyone she would like contacted and she stated she can contact her son. No further questions reported at this time. CSW will to continue to follow and assist with discharge planning needs.   Expected Discharge Plan: Home w Home Health Services Barriers to Discharge: Continued Medical Work up   Patient Goals and CMS Choice Patient states their goals for this hospitalization and ongoing recovery are:: to return home CMS Medicare.gov Compare Post Acute Care list provided to:: Patient    Expected Discharge Plan and Services Expected Discharge Plan: Home w Home Health Services       Living arrangements for the past 2 months: Single Family Home                                      Prior Living Arrangements/Services Living arrangements for the past 2 months: Single Family Home Lives with:: Self Patient language and need for interpreter reviewed:: Yes Do you feel safe going back to the place where you live?: Yes      Need for Family Participation in Patient Care: No (Comment) Care giver support system in place?: Yes (comment)   Criminal Activity/Legal Involvement Pertinent to Current Situation/Hospitalization: No - Comment as needed  Activities of Daily Living Home Assistive Devices/Equipment: Wheelchair ADL Screening  (condition at time of admission) Patient's cognitive ability adequate to safely complete daily activities?: Yes Is the patient deaf or have difficulty hearing?: No Does the patient have difficulty seeing, even when wearing glasses/contacts?: No Does the patient have difficulty concentrating, remembering, or making decisions?: No Patient able to express need for assistance with ADLs?: Yes Does the patient have difficulty dressing or bathing?: Yes Independently performs ADLs?: No Communication: Independent Dressing (OT): Needs assistance Is this a change from baseline?: Change from baseline, expected to last <3days Grooming: Independent Feeding: Independent Bathing: Needs assistance Is this a change from baseline?: Change from baseline, expected to last <3 days Toileting: Needs assistance Is this a change from baseline?: Change from baseline, expected to last <3 days In/Out Bed: Needs assistance Is this a change from baseline?: Change from baseline, expected to last <3 days Walks in Home: Needs assistance Is this a change from baseline?: Pre-admission baseline Does the patient have difficulty walking or climbing stairs?: Yes Weakness of Legs: Left Weakness of Arms/Hands: Right  Permission Sought/Granted                  Emotional Assessment   Attitude/Demeanor/Rapport: Self-Confident Affect (typically observed): Flat Orientation: : Oriented to Self, Oriented to Place, Oriented to  Time, Oriented to Situation Alcohol / Substance Use: Not Applicable Psych Involvement: No (comment)  Admission diagnosis:  TIA (transient ischemic  attack) [G45.9] Stroke (cerebrum) (HCC) [I63.9] Acute ischemic left MCA stroke [facility]) [I63.512] Patient Active Problem List   Diagnosis Date Noted  . TIA (transient ischemic attack) 03/13/2019  . Stroke (cerebrum) ([place]) 03/13/2019  . Acute ischemic stroke ([place])   . PAD (peripheral artery disease) ([place]) 03/23/2018  . Back pain 09/09/2014  .  HYPERLIPIDEMIA 12/18/2006  . RECTAL POLYPS 12/18/2006  . 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 SBO 08/01/2006  . GOITER NOS 03/11/2006  . PERIPHERAL NEUROPATHY 03/11/2006  . HYPERTENSION 03/11/2006  . OSTEOPENIA 03/11/2006  . FOOT DROP 03/11/2006   PCP:  System, Pcp Not In Pharmacy:   [place], [place] [address] Phone: [phone] Fax: [facility] [place], [place] [address] Phone: [phone] Fax: [phone]     Social Determinants of Health (SDOH) Interventions    Readmission Risk Interventions No flowsheet data found.

## 2019-03-14 NOTE — Plan of Care (Signed)
  Problem: Education: Goal: Knowledge of disease or condition will improve Outcome: Progressing Goal: Knowledge of secondary prevention will improve Outcome: Progressing Goal: Knowledge of patient specific risk factors addressed and post discharge goals established will improve Outcome: Progressing Goal: Individualized Educational Video(s) Outcome: Progressing   Problem: Coping: Goal: Will verbalize positive feelings about self Outcome: Progressing Goal: Will identify appropriate support needs Outcome: Progressing   Problem: Health Behavior/Discharge Planning: Goal: Ability to manage health-related needs will improve Outcome: Progressing   Problem: Education: Goal: Knowledge of General Education information will improve Description: Including pain rating scale, medication(s)/side effects and non-pharmacologic comfort measures Outcome: Progressing   Problem: Health Behavior/Discharge Planning: Goal: Ability to manage health-related needs will improve Outcome: Progressing   Problem: Clinical Measurements: Goal: Ability to maintain clinical measurements within normal limits will improve Outcome: Progressing Goal: Will remain free from infection Outcome: Progressing Goal: Diagnostic test results will improve Outcome: Progressing Goal: Respiratory complications will improve Outcome: Progressing Goal: Cardiovascular complication will be avoided Outcome: Progressing   Problem: Activity: Goal: Risk for activity intolerance will decrease Outcome: Progressing   Problem: Nutrition: Goal: Adequate nutrition will be maintained Outcome: Progressing   Problem: Coping: Goal: Level of anxiety will decrease Outcome: Progressing   Problem: Elimination: Goal: Will not experience complications related to bowel motility Outcome: Progressing Goal: Will not experience complications related to urinary retention Outcome: Progressing   Problem: Pain Managment: Goal: General  experience of comfort will improve Outcome: Progressing   Problem: Safety: Goal: Ability to remain free from injury will improve Outcome: Progressing   Problem: Skin Integrity: Goal: Risk for impaired skin integrity will decrease Outcome: Progressing   

## 2019-03-14 NOTE — Consult Note (Addendum)
Hospital Consult    Reason for Consult:  Left lower extremity DVT Requesting Physician:  Dr. Sande Brothers MRN #:  [id number]  History of Present Illness: This is a 74 y.o. female with history significant for hypertension, hyperlipidemia, T2DM, and peripheral vascular disease s/p right Above knee amputation 03/29/18 by Dr. Myra Gianotti for limb ischemia.  She was admitted yesterday 2/25 with right sided weakness and slurred speech and found to have acute left MCA stroke with atherosclerosis of intracranial internal carotid arteries on CT. She apparently is back to her baseline today, however was complaining of pain in her left leg earlier today. This resolved following administration of Gabapentin. She is presently not complaining of any left lower extremity pain, swelling, weakness, coldness or numbness. She usually is wheel chair bound but independent at home. A Left lower extremity venous ultrasound was ordered showing deep vein thrombosis involving the left common femoral vein, SFJ, left femoral vein, left proximal profunda vein, left popliteal vein, left posterior tibial veins, left peroneal veins, and EIV.She presently is on Lovenox. Vascular has been consulted to see the patient and evaluate her DVT   Past Medical History:  Diagnosis Date  . Arthritis    Back and right shoulder  . Constipation   . Diabetes mellitus    Type II  . Diabetes mellitus without complication (HCC)   . Diabetic neuropathy (HCC)   . History of blood transfusion   . Hypercholesteremia   . Hypertension   . Hypothyroidism   . Pneumonia   . Thyroid disease     Past Surgical History:  Procedure Laterality Date  . ABDOMINAL AORTOGRAM N/A 03/25/2018   Procedure: ABDOMINAL AORTOGRAM;  Surgeon: Maeola Harman, MD;  Location: [facility] INVASIVE CV LAB;  Service: Cardiovascular;  Laterality: N/A;  . ABDOMINAL HYSTERECTOMY    . AMPUTATION Right 03/29/2018   Procedure: AMPUTATION ABOVE KNEE;  Surgeon: Nada Libman, MD;   Location: [facility] OR;  Service: Vascular;  Laterality: Right;  . APPENDECTOMY    . BACK SURGERY    . BYPASS GRAFT FEMORAL-PERONEAL Right 03/26/2018   Procedure: BYPASS GRAFT [place];  Surgeon: Maeola Harman, MD;  Location: [facility] OR;  Service: Vascular;  Laterality: Right;  . EXPLORATORY LAPAROTOMY     Adhesions removed and appendix  . FEMORAL-POPLITEAL BYPASS GRAFT Right 03/27/2018   Procedure: Right lower extremity angiogram, Right Femoral Peroneal artery bypass, Angioplasty Right Femoral Peroneal bypass, Thrombectomy of Femoral peroneal artery;  Surgeon: Maeola Harman, MD;  Location: [facility] OR;  Service: Vascular;  Laterality: Right;  . INTRAOPERATIVE ARTERIOGRAM Right 03/26/2018   Procedure: Intra Operative Arteriogram;  Surgeon: Maeola Harman, MD;  Location: [facility] OR;  Service: Vascular;  Laterality: Right;  . LOWER EXTREMITY ANGIOGRAPHY Bilateral 03/25/2018   Procedure: LOWER EXTREMITY ANGIOGRAPHY;  Surgeon: Maeola Harman, MD;  Location: [facility] INVASIVE CV LAB;  Service: Cardiovascular;  Laterality: Bilateral;  . LUMBAR LAMINECTOMY/DECOMPRESSION MICRODISCECTOMY N/A 09/09/2014   Procedure: L4-L5 DECOMPRESSION ;  Surgeon: Venita Lick, MD;  Location: [facility] OR;  Service: Orthopedics;  Laterality: N/A;  . PATCH ANGIOPLASTY Right 03/26/2018   Procedure: PATCH ANGIOPLASTY;  Surgeon: Maeola Harman, MD;  Location: [facility] OR;  Service: Vascular;  Laterality: Right;  . SHOULDER SURGERY    . THYROID SURGERY    . TONSILLECTOMY    . TOTAL THYROIDECTOMY    . VEIN HARVEST Right 03/26/2018   Procedure: Vein Harvest of Right Leg Greater Saphenous Vein in SITU;  Surgeon: Maeola Harman, MD;  Location: [facility] OR;  Service: Vascular;  Laterality: Right;    No Known Allergies  Prior to Admission medications   Medication Sig Start Date End Date Taking? Authorizing Provider  acetaminophen (TYLENOL) 650 MG CR tablet Take 1,300 mg by mouth daily as needed (back  pain).    [provider]  aspirin EC 81 MG EC tablet Take 1 tablet (81 mg total) by mouth daily. Patient not taking: Reported on 12/30/2018 04/02/18   Emilie Rutter, PA-C  atorvastatin (LIPITOR) 10 MG tablet Take 10 mg by mouth daily. 12/17/17   [provider]  gabapentin (NEURONTIN) 600 MG tablet Take 1,200 mg by mouth 2 (two) times daily with breakfast and lunch. 02/06/18   [provider]  glimepiride (AMARYL) 4 MG tablet Take 4 mg by mouth daily.    [provider]  L-Methylfolate-B6-B12 [place]) 3-35-2 MG TABS Take 1 tablet by mouth See admin instructions. Take one tablet by mouth every 2-3 days 03/22/18   [provider]  levothyroxine (SYNTHROID, LEVOTHROID) 100 MCG tablet Take 100 mcg by mouth daily. 02/04/18   [provider]  lisinopril (PRINIVIL,ZESTRIL) 10 MG tablet Take 10 mg by mouth daily. 02/06/18   [provider]  metFORMIN (GLUCOPHAGE) 1000 MG tablet Take 1,000 mg by mouth 2 (two) times daily with breakfast and lunch. 02/06/18   [provider]  metFORMIN (GLUMETZA) 500 MG (MOD) 24 hr tablet Take 500 mg by mouth daily with breakfast.     [provider]  methocarbamol (ROBAXIN) 500 MG tablet Take 1 tablet (500 mg total) by mouth 3 (three) times daily as needed for muscle spasms. 09/09/14   Venita Lick, MD  Multiple Vitamins-Minerals (MULTIVITAMIN & MINERAL PO) Take 1 tablet by mouth daily.    [provider]  Omega 3 1200 MG CAPS Take 1,200 mg by mouth 2 (two) times daily.    [provider]  ondansetron (ZOFRAN) 4 MG tablet Take 1 tablet (4 mg total) by mouth every 8 (eight) hours as needed for nausea or vomiting. 09/09/14   Venita Lick, MD  Oxcarbazepine (TRILEPTAL) 300 MG tablet Take 300 mg by mouth daily.    [provider]  Tetrahydrozoline HCl (VISINE OP) Place 1 drop into both eyes daily as needed (dry eyes).    [provider]    Social History    Socioeconomic History  . Marital status: Married    Spouse name: Not on file  . Number of children: Not on file  . Years of education: Not on file  . Highest education level: Not on file  Occupational History  . Not on file  Tobacco Use  . Smoking status: Current Every Day Smoker    Packs/day: 1.00    Years: 50.00    Pack years: 50.00    Types: Cigarettes  . Smokeless tobacco: Never Used  Substance and Sexual Activity  . Alcohol use: Yes    Comment: occasional  . Drug use: Never  . Sexual activity: Not on file  Other Topics Concern  . Not on file  Social History Narrative   ** Merged History Encounter **       Social Determinants of Health   Financial Resource Strain:   . Difficulty of Paying Living Expenses: Not on file  Food Insecurity:   . Worried About Programme researcher, broadcasting/film/video in the Last Year: Not on file  . Ran Out of Food in the Last Year: Not on file  Transportation Needs:   . Lack of Transportation (  Medical): Not on file  . Lack of Transportation (Non-Medical): Not on file  Physical Activity:   . Days of Exercise per Week: Not on file  . Minutes of Exercise per Session: Not on file  Stress:   . Feeling of Stress : Not on file  Social Connections:   . Frequency of Communication with Friends and Family: Not on file  . Frequency of Social Gatherings with Friends and Family: Not on file  . Attends Religious Services: Not on file  . Active Member of Clubs or Organizations: Not on file  . Attends Archivist Meetings: Not on file  . Marital Status: Not on file  Intimate Partner Violence:   . Fear of Current or Ex-Partner: Not on file  . Emotionally Abused: Not on file  . Physically Abused: Not on file  . Sexually Abused: Not on file     History reviewed. No pertinent family history.   ROS: Otherwise negative unless mentioned in HPI  Physical Examination  Vitals:   03/14/19 1207 03/14/19 1332  BP: 129/70   Pulse: 73 80  Resp: 19   Temp:  99.3 F (37.4 C)   SpO2: 94% 95%   Body mass index is 22.56 kg/m.  General: well nourished, not in any discomfort Gait: Not observed HENT: WNL, normocephalic Pulmonary: normal non-labored breathing, without Rales, rhonchi,  wheezing Cardiac: regular, without  Murmurs, rubs or gallops; without carotid bruits Abdomen: soft, NT/ND, no masses, normal bowel sounds Skin: without rashes Vascular Exam/Pulses: 2+ femoral pulses bilaterally, left popliteal pulse 1+, no palpable left lower extremity distal pulses, Doppler PT/pero. Right Above knee amputation healed Extremities: without ischemic changes, without Gangrene , without cellulitis; without open wounds; No significant swelling or edema of LLE Musculoskeletal: no muscle wasting or atrophy  Neurologic: A&O X 3;  No focal weakness or paresthesias are detected; speech is fluent/normal Psychiatric:  The pt has Normal affect. Lymph:  Unremarkable  CBC    Component Value Date/Time   WBC 7.6 03/14/2019 0341   RBC 3.49 (L) 03/14/2019 0341   HGB 10.6 (L) 03/14/2019 0341   HCT 33.2 (L) 03/14/2019 0341   PLT 330 03/14/2019 0341   MCV 95.1 03/14/2019 0341   MCH 30.4 03/14/2019 0341   MCHC 31.9 03/14/2019 0341   RDW 13.5 03/14/2019 0341   LYMPHSABS 1.8 03/13/2019 1628   MONOABS 0.4 03/13/2019 1628   EOSABS 0.4 03/13/2019 1628   BASOSABS 0.0 03/13/2019 1628    BMET    Component Value Date/Time   NA 142 03/14/2019 0341   K 4.3 03/14/2019 0341   CL 107 03/14/2019 0341   CO2 27 03/14/2019 0341   GLUCOSE 120 (H) 03/14/2019 0341   GLUCOSE 83 11/27/2005 1057   BUN 12 03/14/2019 0341   CREATININE 0.97 03/14/2019 0341   CALCIUM 8.8 (L) 03/14/2019 0341   GFRNONAA 59 (L) 03/14/2019 0341   GFRAA >60 03/14/2019 0341    COAGS: Lab Results  Component Value Date   INR 1.0 03/13/2019   INR 1.0 03/25/2018     Non-Invasive Vascular Imaging:   CTA neck:  1. The bilateral common and internal carotid arteries are patent within the neck  without stenosis. Mild mixed plaque at the carotid bifurcations. 2. The vertebral arteries are patent within the neck bilaterally. Moderate/severe atherosclerotic narrowing at the origin of the right vertebral artery. Mild/moderate atherosclerotic narrowing at the origin of the left vertebral artery. 3. Incidentally noted aberrant right subclavian artery. 4. 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  CTA head:  1. Occlusion or near occlusion of a proximal to mid left M2 MCA branch vessel as described. 2. 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 Sites of up to moderate stenosis within the cavernous segments bilaterally. 3. Mild atherosclerotic narrowing of the V4 left vertebral artery.  VAS [place] Lower extremity venous Bilat(DVT): RIGHT:  - There is no evidence of deep vein thrombosis in the CFV, FV prox, and  prof. Right above knee amputation.    LEFT:  - Findings consistent with acute deep vein thrombosis involving the left  common femoral vein, SF junction, left femoral vein, left proximal  profunda vein, left popliteal vein, left posterior tibial veins, left  peroneal veins, and EIV.  - No cystic structure found in the popliteal fossa.   Statin:  Yes.   Beta Blocker:  No. Aspirin:  Yes.   ACEI:  No. ARB:  No. CCB use:  No Other antiplatelets/anticoagulants:  Yes.   Lovenox    ASSESSMENT/PLAN: This is a 74 y.o. female  with history of hypertension, hyperlipidemia, T2DM, peripheral vascular disease s/p right AKA who presented with acute ischemic stroke with MCA infarct and who was found to have deep vein thrombosis of left common femoral vein, SFJ, femoral vein, proximal profunda vein, popliteal vein, posterior tibial vein, peroneal vein, and EIV. She is presently asymptomatic. She has underlying peripheral vascular disease but adequate perfusion of left lower extremity  with no motor or sensory deficits. She is presently on Lovenox 40 which is subtherapeutic, Would recommend anticoagulate her either on Heparin drip or Xarelto through the weekend and see how her leg improves versus percutanenous mechanical venous thrombectomy versus thrombolysis next week pending how she improves through the weekend. Will discuss with on call MD who will see patient later this afternoon   Graceann Congress PA-C [facility] [phone] 03/14/2019  2:18 PM   I have independently interviewed and examined patient and agree with PA assessment and plan above. Well known to me from previous arterial interventions. Needs full dose anticoagulation when ok per neurology. Bubble study would be helpful information with paradoxical embolus possible source of cva.  IVC/iliac vein duplex ordered to evaluate more central veins. As patient is dnr would not intervene unless removing obstruction or source of embolus would decrease risk of further complications given that is currently asymptomatic from a left lower extremity standpoint.   Jermie Hippe C. Randie Heinz, MD [facility] [place] Office: [phone] Pager: [phone]

## 2019-03-14 NOTE — Progress Notes (Signed)
Spoke with pt's (god daughter) at 61 and she stated that pt's boss noticed pt's slurred speech 03/13/2019 at 0730. Pt had a fall out of her bed (hitting her head) about 2 weeks prior this admission, which resulted in pt having a performance review at work d/t memory and performance decline.

## 2019-03-14 NOTE — Evaluation (Signed)
Clinical/Bedside Swallow Evaluation Patient Details  Name: Emma [name] MRN: [id number] Date of Birth: 26-Apr-1945  Today's Date: 03/14/2019 Time: SLP Start Time (ACUTE ONLY): 0093 SLP Stop Time (ACUTE ONLY): 0949 SLP Time Calculation (min) (ACUTE ONLY): 13 min  Past Medical History:  Past Medical History:  Diagnosis Date  . Arthritis    Back and right shoulder  . Constipation   . Diabetes mellitus    Type II  . Diabetes mellitus without complication ([place])   . Diabetic neuropathy ([place])   . History of blood transfusion   . Hypercholesteremia   . Hypertension   . Hypothyroidism   . Pneumonia   . Thyroid disease    Past Surgical History:  Past Surgical History:  Procedure Laterality Date  . ABDOMINAL AORTOGRAM N/A 03/25/2018   Procedure: ABDOMINAL AORTOGRAM;  Surgeon: Waynetta Sandy, MD;  Location: [place] CV LAB;  Service: Cardiovascular;  Laterality: N/A;  . ABDOMINAL HYSTERECTOMY    . AMPUTATION Right 03/29/2018   Procedure: AMPUTATION ABOVE KNEE;  Surgeon: Serafina Mitchell, MD;  Location: [place];  Service: Vascular;  Laterality: Right;  . APPENDECTOMY    . BACK SURGERY    . BYPASS GRAFT FEMORAL-PERONEAL Right 03/26/2018   Procedure: BYPASS GRAFT [place];  Surgeon: Waynetta Sandy, MD;  Location: [place];  Service: Vascular;  Laterality: Right;  . EXPLORATORY LAPAROTOMY     Adhesions removed and appendix  . FEMORAL-POPLITEAL BYPASS GRAFT Right 03/27/2018   Procedure: Right lower extremity angiogram, Right Femoral Peroneal artery bypass, Angioplasty Right Femoral Peroneal bypass, Thrombectomy of Femoral peroneal artery;  Surgeon: Waynetta Sandy, MD;  Location: [place];  Service: Vascular;  Laterality: Right;  . INTRAOPERATIVE ARTERIOGRAM Right 03/26/2018   Procedure: Intra Operative Arteriogram;  Surgeon: Waynetta Sandy, MD;  Location: [place];  Service: Vascular;  Laterality: Right;  . LOWER EXTREMITY ANGIOGRAPHY Bilateral  03/25/2018   Procedure: LOWER EXTREMITY ANGIOGRAPHY;  Surgeon: Waynetta Sandy, MD;  Location: [place] CV LAB;  Service: Cardiovascular;  Laterality: Bilateral;  . LUMBAR LAMINECTOMY/DECOMPRESSION MICRODISCECTOMY N/A 09/09/2014   Procedure: L4-L5 DECOMPRESSION ;  Surgeon: Melina Schools, MD;  Location: [place];  Service: Orthopedics;  Laterality: N/A;  . PATCH ANGIOPLASTY Right 03/26/2018   Procedure: PATCH ANGIOPLASTY;  Surgeon: Waynetta Sandy, MD;  Location: [place];  Service: Vascular;  Laterality: Right;  . SHOULDER SURGERY    . THYROID SURGERY    . TONSILLECTOMY    . TOTAL THYROIDECTOMY    . VEIN HARVEST Right 03/26/2018   Procedure: Vein Harvest of Right Leg Greater Saphenous Vein in SITU;  Surgeon: Waynetta Sandy, MD;  Location: [facility] OR;  Service: Vascular;  Laterality: Right;   HPI:  Pt is a 74 year old female with a past medical history significant for PVD (s/p R AKA), HTN, HLD, hypothyroidism, T2DM, who presented with right-sided weakness and slurred speech upon awakening from her sleep. MRI of the brain: Small acute infarcts within the left frontal lobe. Pt failed [facility] swallow screen due to coughing.    Assessment / Plan / Recommendation Clinical Impression  Pt was seen for bedside swallow evaluation and she denied a history of dysphagia. Oral mechanism exam revealed reduced dentition and mildly reduced lingual strength. She tolerated all solids and liquids without signs or symptoms of oropharyngeal dysphagia. Mastication time was [facility] despite reduced dentition and no significant residue was noted. SLP orders were not received for speech, language, cognition evaluation. However, she does present with dysarthria which she indicated is  approximately 75% back to baseline.SLP will follow diet tolerance. SLP Visit Diagnosis: Dysphagia, unspecified (R13.10);Dysarthria and anarthria (R47.1)    Aspiration Risk  No limitations    Diet Recommendation Regular;Thin  liquid   Liquid Administration via: Cup;Straw Medication Administration: Whole meds with liquid Supervision: Patient able to self feed Postural Changes: Seated upright at 90 degrees    Other  Recommendations Oral Care Recommendations: Oral care BID   Follow up Recommendations Other (comment)(TBD)      Frequency and Duration min 2x/week  1 week       Prognosis Prognosis for Safe Diet Advancement: Good      Swallow Study   General Date of Onset: 03/13/19 HPI: Pt is a 74 year old female with a past medical history significant for PVD (s/p R AKA), HTN, HLD, hypothyroidism, T2DM, who presented with right-sided weakness and slurred speech upon awakening from her sleep. MRI of the brain: Small acute infarcts within the left frontal lobe. Pt failed [facility] swallow screen due to coughing.  Type of Study: Bedside Swallow Evaluation Previous Swallow Assessment: None Diet Prior to this Study: NPO Temperature Spikes Noted: No Respiratory Status: Room air History of Recent Intubation: No Behavior/Cognition: Alert;Cooperative;Pleasant mood Oral Cavity Assessment: Within Functional Limits Oral Care Completed by SLP: Recent completion by staff Vision: Functional for self-feeding Self-Feeding Abilities: Able to feed self Patient Positioning: Upright in bed;Postural control adequate for testing Baseline Vocal Quality: Hoarse Volitional Cough: Strong Volitional Swallow: Able to elicit    Oral/Motor/Sensory Function Overall Oral Motor/Sensory Function: Mild impairment Facial ROM: Within Functional Limits Facial Symmetry: Within Functional Limits Facial Strength: Within Functional Limits Facial Sensation: Within Functional Limits Lingual ROM: Reduced right Lingual Symmetry: Within Functional Limits Lingual Strength: Reduced Lingual Sensation: Within Functional Limits Velum: Within Functional Limits Mandible: Within Functional Limits   Ice Chips Ice chips: Not tested(Pt refused ice and  anything cold)   Thin Liquid Thin Liquid: Within functional limits Presentation: Straw    Nectar Thick Nectar Thick Liquid: Not tested   Honey Thick Honey Thick Liquid: Not tested   Puree Puree: Within functional limits Presentation: Spoon   Solid     Solid: Within functional limits Presentation: Self Fed     Chirstine Defrain I. Vear Clock, MS, CCC-SLP [facility] Services Office number [phone] Pager [phone]  Scheryl Marten 03/14/2019,9:59 AM

## 2019-03-14 NOTE — Progress Notes (Signed)
Corky Mull (daughter) [phone] updated at [phone] about pt's status.

## 2019-03-14 NOTE — Progress Notes (Signed)
Bilateral lower extremity venous duplex exam completed.  Preliminary results given to Adelina Mings, RN.  Preliminary results can be found under CV proc under chart review.  03/14/2019 1:14 PM  Juanda Luba, K., RDMS, RVT

## 2019-03-14 NOTE — Evaluation (Signed)
Physical Therapy Evaluation Patient Details Name: Emma [name] MRN: [id number] DOB: June 22, 1945 Today's Date: 03/14/2019   History of Present Illness  The pt is a 74 yo female presenting due to onset of R-sided weakness and slurred speech. CT revelaed acute L MCA stroke and small infarcts in L frontal lobe. PMH includes: hypertension, hyperlipidemia, diabetes, and RLE AKA.  Clinical Impression  Pt in bed upon arrival of PT, agreeable to evaluation at this time. Prior to admission the pt was independent with [facility] for mobility and reports she was still driving and working without need for assistance. The pt now presents with limitations in functional mobility and stability due to above dx, and will continue to benefit from skilled PT to address these deficits. The pt was able to demo multiple stand-pivot transfers today from bed to chair, but requires modA of 1 to complete, and will need continued PT to progress functional strengthening and safety with mobility before she is safe to return to mobilizing independently.      Follow Up Recommendations SNF;Supervision/Assistance - 24 hour    Equipment Recommendations  (pt has WC, defer to post-acute)    Recommendations for Other Services       Precautions / Restrictions Precautions Precautions: Fall Precaution Comments: pt with previous R AKA, does not use prosthesis Restrictions Weight Bearing Restrictions: No      Mobility  Bed Mobility Overal bed mobility: Needs Assistance Bed Mobility: Supine to Sit;Sit to Supine     Supine to sit: HOB elevated;Min assist Sit to supine: Min guard   General bed mobility comments: pt able to initate bed mobiltiy by rolling to L, good reach with RUE. Pt then able to come to sitting with use of rails and minA for trunk elevation. pt able to return to supine without assist  Transfers Overall transfer level: Needs assistance Equipment used: 1 person hand held assist Transfers: Stand Pivot  Transfers   Stand pivot transfers: Mod assist;From elevated surface       General transfer comment: modA from elevated bed when pivoting on LLE to L. Pt with good reach of LUE to pull to chair.  Ambulation/Gait Ambulation/Gait assistance: (pt uses WC at baseline)              Financial trader Rankin (Stroke Patients Only) Modified Rankin (Stroke Patients Only) Pre-Morbid Rankin Score: No symptoms Modified Rankin: Severe disability     Balance Overall balance assessment: Needs assistance Sitting-balance support: No upper extremity supported;Feet unsupported Sitting balance-Leahy Scale: Fair Sitting balance - Comments: pt able to maintain without support, benefits from support   Standing balance support: Bilateral upper extremity supported;During functional activity Standing balance-Leahy Scale: Poor Standing balance comment: relaint on BUE support and modA of 1                             Pertinent Vitals/Pain Pain Assessment: No/denies pain    Home Living Family/patient expects to be discharged to:: Private residence Living Arrangements: Alone Available Help at Discharge: (pt reports she does not have friends or family who can assist) Type of Home: Apartment Home Access: Ramped entrance     Home Layout: One level Home Equipment: Walker - 2 wheels;Cane - single point;Bedside commode;Wheelchair - manual Additional Comments: pt reports she has a preosthesis but no longer uses it becasue she is unable to don it independently and  has no one who can assist her    Prior Function Level of Independence: Independent with assistive device(s)         Comments: independent with WC for all ADLs and IADLs, works customer service     Hand Dominance   Dominant Hand: Left    Extremity/Trunk Assessment   Upper Extremity Assessment Upper Extremity Assessment: Defer to OT evaluation    Lower Extremity  Assessment Lower Extremity Assessment: RLE deficits/detail;LLE deficits/detail RLE Deficits / Details: R AKA, pt does have prosthetic but has not used reccently as she is unable to don it independently. LLE Deficits / Details: able to pivot on LLE, but general weakness    Cervical / Trunk Assessment Cervical / Trunk Assessment: Kyphotic  Communication   Communication: No difficulties;Other (comment)(some mumbling with speech, mild dysarthria)  Cognition Arousal/Alertness: Awake/alert Behavior During Therapy: Agitated;Flat affect Overall Cognitive Status: No family/caregiver present to determine baseline cognitive functioning                                 General Comments: Pt generally agreeable to work with therapy, became frustrated with asking about PLF and assist after d/c.      General Comments      Exercises     Assessment/Plan    PT Assessment Patient needs continued PT services  PT Problem List Decreased strength;Decreased mobility;Decreased safety awareness;Decreased coordination;Decreased activity tolerance;Decreased balance;Decreased knowledge of use of DME       PT Treatment Interventions DME instruction;Therapeutic exercise;Wheelchair mobility training;Gait training;Balance training;Neuromuscular re-education;Cognitive remediation;Functional mobility training;Therapeutic activities;Patient/family education    PT Goals (Current goals can be found in the Care Plan section)  Acute Rehab PT Goals Patient Stated Goal: to get back to work PT Goal Formulation: With patient Time For Goal Achievement: 03/28/19 Potential to Achieve Goals: Good    Frequency Min 3X/week   Barriers to discharge Decreased caregiver support pt reports she lives alone and has no one that could help if needed after d/c    Co-evaluation               AM-PAC PT "6 Clicks" Mobility  Outcome Measure Help needed turning from your back to your side while in a flat bed  without using bedrails?: A Little Help needed moving from lying on your back to sitting on the side of a flat bed without using bedrails?: A Little Help needed moving to and from a bed to a chair (including a wheelchair)?: A Lot Help needed standing up from a chair using your arms (e.g., wheelchair or bedside chair)?: A Lot Help needed to walk in hospital room?: Total Help needed climbing 3-5 steps with a railing? : Total 6 Click Score: 12    End of Session Equipment Utilized During Treatment: Gait belt Activity Tolerance: Patient tolerated treatment well Patient left: in bed;with call bell/phone within reach(US tech present) Nurse Communication: Mobility status PT Visit Diagnosis: Difficulty in walking, not elsewhere classified (R26.2);Repeated falls (R29.6);Muscle weakness (generalized) (M62.81);Hemiplegia and hemiparesis Hemiplegia - Right/Left: Right Hemiplegia - dominant/non-dominant: Non-dominant Hemiplegia - caused by: Cerebral infarction    Time: 4782-9562 PT Time Calculation (min) (ACUTE ONLY): 27 min   Charges:   PT Evaluation $PT Eval Moderate Complexity: 1 Mod PT Treatments $Gait Training: 8-22 mins        Rolm Baptise, PT, DPT   Acute Rehabilitation Department Pager #: [phone]  Gaetana Michaelis 03/14/2019, 1:50 PM

## 2019-03-15 ENCOUNTER — Inpatient Hospital Stay (HOSPITAL_COMMUNITY): Payer: Medicare HMO

## 2019-03-15 DIAGNOSIS — I82409 Acute embolism and thrombosis of unspecified deep veins of unspecified lower extremity: Secondary | ICD-10-CM

## 2019-03-15 LAB — GLUCOSE, CAPILLARY
Glucose-Capillary: 135 mg/dL — ABNORMAL HIGH (ref 70–99)
Glucose-Capillary: 141 mg/dL — ABNORMAL HIGH (ref 70–99)
Glucose-Capillary: 153 mg/dL — ABNORMAL HIGH (ref 70–99)
Glucose-Capillary: 161 mg/dL — ABNORMAL HIGH (ref 70–99)
Glucose-Capillary: 169 mg/dL — ABNORMAL HIGH (ref 70–99)
Glucose-Capillary: 176 mg/dL — ABNORMAL HIGH (ref 70–99)
Glucose-Capillary: 213 mg/dL — ABNORMAL HIGH (ref 70–99)

## 2019-03-15 LAB — TSH: TSH: 9.996 u[IU]/mL — ABNORMAL HIGH (ref 0.350–4.500)

## 2019-03-15 MED ORDER — SENNA 8.6 MG PO TABS
2.0000 | ORAL_TABLET | Freq: Once | ORAL | Status: AC
  Administered 2019-03-15: 23:00:00 17.2 mg via ORAL
  Filled 2019-03-15: qty 2

## 2019-03-15 MED ORDER — ATORVASTATIN CALCIUM 40 MG PO TABS: 40.0000 mg | ORAL_TABLET | Freq: Every day | ORAL | 0 refills | Status: DC

## 2019-03-15 MED ORDER — APIXABAN 5 MG PO TABS: 5.0000 mg | ORAL_TABLET | Freq: Two times a day (BID) | ORAL | 0 refills | Status: DC

## 2019-03-15 NOTE — Progress Notes (Signed)
  Progress Note    03/15/2019 9:26 AM * No surgery found *  Subjective: Patient wants to go home  Vitals:   03/15/19 0408 03/15/19 0818  BP: (!) 119/59 (!) 129/58  Pulse: 79 78  Resp: 18 16  Temp: 98.8 F (37.1 C) 99.8 F (37.7 C)  SpO2: 94% 95%    Physical Exam: Awake alert and oriented Nonlabored respirations Left leg without significant edema  CBC    Component Value Date/Time   WBC 7.6 03/14/2019 0341   RBC 3.49 (L) 03/14/2019 0341   HGB 10.6 (L) 03/14/2019 0341   HCT 33.2 (L) 03/14/2019 0341   PLT 330 03/14/2019 0341   MCV 95.1 03/14/2019 0341   MCH 30.4 03/14/2019 0341   MCHC 31.9 03/14/2019 0341   RDW 13.5 03/14/2019 0341   LYMPHSABS 1.8 03/13/2019 1628   MONOABS 0.4 03/13/2019 1628   EOSABS 0.4 03/13/2019 1628   BASOSABS 0.0 03/13/2019 1628    BMET    Component Value Date/Time   NA 142 03/14/2019 0341   K 4.3 03/14/2019 0341   CL 107 03/14/2019 0341   CO2 27 03/14/2019 0341   GLUCOSE 120 (H) 03/14/2019 0341   GLUCOSE 83 11/27/2005 1057   BUN 12 03/14/2019 0341   CREATININE 0.97 03/14/2019 0341   CALCIUM 8.8 (L) 03/14/2019 0341   GFRNONAA 59 (L) 03/14/2019 0341   GFRAA >60 03/14/2019 0341    INR    Component Value Date/Time   INR 1.0 03/13/2019 1628     Intake/Output Summary (Last 24 hours) at 03/15/2019 0926 Last data filed at 03/15/2019 0103 Gross per 24 hour  Intake --  Output 800 ml  Net -800 ml     Assessment/plan:  74 y.o. female is admitted with stroke.  Bilateral carotid arteries are clean.  She had an echo but no bubble study was performed.  Patient does not want to wait for bubble study on Monday although I discussed with her that this could give [place] the etiology of her stroke.  Either way she is on anticoagulation.  I reviewed her IVC and iliac study does not demonstrate any thrombus there so from a lower extremity standpoint she would need anticoagulation for 3 months.  She can see me on an as-needed basis.   Armonte Tortorella C.  Randie Heinz, MD [facility] Office: [phone] Pager: [phone]  03/15/2019 9:26 AM

## 2019-03-15 NOTE — Progress Notes (Signed)
   Subjective:   Patient states that all the studies have been done and she wants to go home. Patient tearful, repeatedly stating she wants to go home. Counseled that we are worried that clot in her legs caused stroke in her brain. Counseled on need for additional test on Monday. Patient adamantly declines, she also declines going to rehabilitation facility. States she is open to home health services.   Objective:  Vital signs in last 24 hours: Vitals:   03/14/19 2036 03/14/19 2333 03/15/19 0408 03/15/19 0410  BP: 123/74 125/69 (!) 119/59   Pulse: 79 78 79   Resp: 16 18 18    Temp: 98.9 F (37.2 C) 98.1 F (36.7 C) 98.8 F (37.1 C)   TempSrc: Oral  Oral   SpO2: 98% 95% 94%   Weight:    65 kg  Height:       Physical Exam  Constitutional: She is oriented to person, place, and time. She appears well-developed and well-nourished. She appears distressed.  Cardiovascular: Normal rate, regular rhythm and normal heart sounds.  Respiratory: Effort normal and breath sounds normal. No respiratory distress. She has no wheezes.  Musculoskeletal:        General: No edema.  Neurological: She is alert and oriented to person, place, and time. No cranial nerve deficit.  Skin: She is not diaphoretic.  Psychiatric: She has a normal mood and affect. Her speech is slurred. She is agitated.     Assessment/Plan:  Principal Problem:   Stroke (cerebrum) (HCC) Active Problems:   TIA (transient ischemic attack)   DVT (deep venous thrombosis) (HCC)  Emma [name] is a 74 y.o f with diabetes mellitus 2, hypothyroidism who presented with right sided weakness, dysphagia, dysarthria that showed small acute infarcts in left frontal lobbe.   Small left frontal lobe infarcts  Left lower extremity DVT The infarcts are in embolic patter. Stroke team following. Patient has prox to md l M2 occlusion  Seen on cta head. TTE did not show source of embolus. LE doppler showed l dvt in common femoral, sf jnx,  femoral, prox profunda, popliteal, pop tib, peroneal, and EIV.   -Patient to get TCD bubble study on Monday 3/1 to assess for PFO but she has refused and wants to go home -lifelong anticoagulation with eliquis 5mg  bid for dvt and stroke  -pending ivc/iliac vein duplex -appreciate vascular surgery following  -pending placement to snf if patient is agreeable  -continue lipitor 40mg  qd   Diabetes mellitus type 2   -home metformin being held -ssi  Hypertension  The patient's blood pressure has been ranging   Hypothyroidism   -continue iv levothryoxine Monday qd   Soft tissue mass in neck  -follow up outpatient  Dispo: Anticipated discharge today.  , MD 03/15/2019, 6:22 AM

## 2019-03-15 NOTE — Evaluation (Signed)
Speech Language Pathology Evaluation Patient Details Name: Emma [name] MRN: [id number] DOB: 08-13-1945 Today's Date: 03/15/2019 Time: 6606-3016 SLP Time Calculation (min) (ACUTE ONLY): 24 min  Problem List:  Patient Active Problem List   Diagnosis Date Noted  . DVT (deep venous thrombosis) ([place]) 03/14/2019  . TIA (transient ischemic attack) 03/13/2019  . Stroke (cerebrum) ([place]) 03/13/2019  . Acute ischemic stroke ([place])   . PAD (peripheral artery disease) ([place]) 03/23/2018  . Back pain 09/09/2014  . HYPERLIPIDEMIA 12/18/2006  . RECTAL POLYPS 12/18/2006  . 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 SBO 08/01/2006  . GOITER NOS 03/11/2006  . PERIPHERAL NEUROPATHY 03/11/2006  . HYPERTENSION 03/11/2006  . OSTEOPENIA 03/11/2006  . FOOT DROP 03/11/2006   Past Medical History:  Past Medical History:  Diagnosis Date  . Arthritis    Back and right shoulder  . Constipation   . Diabetes mellitus    Type II  . Diabetes mellitus without complication ([place])   . Diabetic neuropathy ([place])   . History of blood transfusion   . Hypercholesteremia   . Hypertension   . Hypothyroidism   . Pneumonia   . Thyroid disease    Past Surgical History:  Past Surgical History:  Procedure Laterality Date  . ABDOMINAL AORTOGRAM N/A 03/25/2018   Procedure: ABDOMINAL AORTOGRAM;  Surgeon: Waynetta Sandy, MD;  Location: [facility];  Service: Cardiovascular;  Laterality: N/A;  . ABDOMINAL HYSTERECTOMY    . AMPUTATION Right 03/29/2018   Procedure: AMPUTATION ABOVE KNEE;  Surgeon: Serafina Mitchell, MD;  Location: [facility];  Service: Vascular;  Laterality: Right;  . APPENDECTOMY    . BACK SURGERY    . BYPASS GRAFT FEMORAL-PERONEAL Right 03/26/2018   Procedure: BYPASS GRAFT [place]all;  Surgeon: Waynetta Sandy, MD;  Location: [facility];  Service: Vascular;  Laterality: Right;  . EXPLORATORY LAPAROTOMY     Adhesions removed and appendix  . FEMORAL-POPLITEAL BYPASS GRAFT Right 03/27/2018   Procedure:  Right lower extremity angiogram, Right Femoral Peroneal artery bypass, Angioplasty Right Femoral Peroneal bypass, Thrombectomy of Femoral peroneal artery;  Surgeon: Waynetta Sandy, MD;  Location: [facility];  Service: Vascular;  Laterality: Right;  . INTRAOPERATIVE ARTERIOGRAM Right 03/26/2018   Procedure: Intra Operative Arteriogram;  Surgeon: Waynetta Sandy, MD;  Location: [facility];  Service: Vascular;  Laterality: Right;  . LOWER EXTREMITY ANGIOGRAPHY Bilateral 03/25/2018   Procedure: LOWER EXTREMITY ANGIOGRAPHY;  Surgeon: Waynetta Sandy, MD;  Location: [facility];  Service: Cardiovascular;  Laterality: Bilateral;  . LUMBAR LAMINECTOMY/DECOMPRESSION MICRODISCECTOMY N/A 09/09/2014   Procedure: L4-L5 DECOMPRESSION ;  Surgeon: Melina Schools, MD;  Location: [facility];  Service: Orthopedics;  Laterality: N/A;  . PATCH ANGIOPLASTY Right 03/26/2018   Procedure: PATCH ANGIOPLASTY;  Surgeon: Waynetta Sandy, MD;  Location: [facility];  Service: Vascular;  Laterality: Right;  . SHOULDER SURGERY    . THYROID SURGERY    . TONSILLECTOMY    . TOTAL THYROIDECTOMY    . VEIN HARVEST Right 03/26/2018   Procedure: Vein Harvest of Right Leg Greater Saphenous Vein in SITU;  Surgeon: Waynetta Sandy, MD;  Location: [facility];  Service: Vascular;  Laterality: Right;   HPI:  Pt is a 74 year old female with a past medical history significant for PVD (s/p R AKA), HTN, HLD, hypothyroidism, T2DM, who presented with right-sided weakness and slurred speech upon awakening from her sleep on 03/13/19; MRI of the brain: Small acute infarcts within the left frontal lobe. Pt failed [facility] swallow screen due to  coughing.  BSE completed on 03/14/19 and a regular/thin liquid diet was recommended d/t mild right lingual weakness/strength; SLE suggested d/t dysarthric speech; SLE generated.  Assessment / Plan / Recommendation Clinical Impression  Pt seen for a speech/language/cognitive assessment with  MOCA (Montreal Cognitive Assessent) given and a score of 21/26 yielded with portions deleted d/t pt not having reading glasses readily available and/or pt refused portions of MOCA; deficit areas included memory and attention with certain tasks (ie: calculation); pt is dysarthric which affects overall communication with 50-75% intelligibility noted based on length of utterance within words-conversation.  She exhibited improvement with strategies for increased vocal intensity, slow, precise over-articulation and pausing between words during communicative interactions.  Concern for decreased safety awareness as pt exhibits right UE sensory involvement which may impact return to home and completion of ADLs safely and effectively.  Pt adamant about not going to SNF and returning home with [facility] ST and other recommended therapies.  ST recommended while in house for dysarthria/cognition and diet tolerance/education.  SNF vs HH ST recommended.  Thank you for this consult.    SLP Assessment  SLP Recommendation/Assessment: Patient needs continued Speech Language Pathology Services SLP Visit Diagnosis: Dysarthria and anarthria (R47.1);Cognitive communication deficit (R41.841)    Follow Up Recommendations  Other (comment)(HH vs SNF)per pt request    Frequency and Duration min 2x/week  1 week      SLP Evaluation Cognition  Overall Cognitive Status: No family/caregiver present to determine baseline cognitive functioning Arousal/Alertness: Awake/alert Orientation Level: Oriented X4 Attention: Sustained Sustained Attention: Impaired Sustained Attention Impairment: Verbal basic;Functional basic Memory: Impaired Memory Impairment: Retrieval deficit;Decreased short term memory;Decreased recall of new information Decreased Short Term Memory: Verbal basic;Functional basic Awareness: Impaired Awareness Impairment: Anticipatory impairment Problem Solving: Impaired Problem Solving Impairment: Verbal  complex;Functional complex Executive Function: Landscape architect: Impaired Organizing Impairment: Verbal basic;Functional basic Safety/Judgment: Impaired Comments: (concerns for safety awareness)       Comprehension  Auditory Comprehension Overall Auditory Comprehension: Appears within functional limits for tasks assessed Yes/No Questions: Within Functional Limits Commands: Within Functional Limits Conversation: Complex Interfering Components: Working Radio broadcast assistant: Press photographer Discrimination: Not tested Reading Comprehension Reading Status: Unable to assess (comment)(no reading glasses available)    Expression Expression Primary Mode of Expression: Verbal Verbal Expression Overall Verbal Expression: Impaired Initiation: No impairment Level of Generative/Spontaneous Verbalization: Conversation Repetition: No impairment Naming: No impairment Interfering Components: Speech intelligibility Non-Verbal Means of Communication: Not applicable Written Expression Dominant Hand: Left Written Expression: Not tested   Oral / Motor  Oral Motor/Sensory Function Overall Oral Motor/Sensory Function: Mild impairment Facial ROM: Within Functional Limits Facial Symmetry: Within Functional Limits Facial Strength: Within Functional Limits Facial Sensation: Within Functional Limits Lingual ROM: Reduced right Lingual Symmetry: Within Functional Limits Lingual Strength: Reduced Lingual Sensation: Within Functional Limits Velum: Within Functional Limits Mandible: Within Functional Limits Motor Speech Respiration: Within functional limits Phonation: Low vocal intensity Resonance: Within functional limits Articulation: Impaired Level of Impairment: Phrase Intelligibility: Intelligibility reduced Word: 25-49% accurate Phrase: 25-49% accurate Sentence: 25-49% accurate Conversation: 25-49%  accurate Motor Planning: Witnin functional limits Motor Speech Errors: Not applicable Effective Techniques: Slow rate;Increased vocal intensity;Over-articulate;Pause                      Tressie Stalker, M.S.,CCC-SLP 03/15/2019, 1:49 PM

## 2019-03-15 NOTE — Care Management (Signed)
Spoke w patient's son who states that he spoke w MD about DC. He states that patient is now agreeable to stay until Monday to get the transcranial doppler done. I have requested Dr Delma Officer to remove DC order if this is the case.  TOC will continue to follow for DC plan.

## 2019-03-15 NOTE — Discharge Summary (Addendum)
Name: Emma [name] MRN: [id number] DOB: 1945/03/26 74 y.o. PCP: System, Pcp Not In  Date of Admission: 03/13/2019  4:12 PM Date of Discharge: 03/20/2018 Attending Physician: Velna Ochs, MD  Discharge Diagnosis: 1. Left MCA infarcts  2. Type 2 Diabetes Mellitus with Neuropathy  3. Left lower extremity DVTs  Discharge Medications: Allergies as of 03/20/2019   No Known Allergies     Medication List    STOP taking these medications   acetaminophen 650 MG CR tablet Commonly known as: TYLENOL Replaced by: acetaminophen 325 MG tablet   aspirin 81 MG EC tablet     TAKE these medications   acetaminophen 325 MG tablet Commonly known as: TYLENOL Take 2 tablets (650 mg total) by mouth every 6 (six) hours as needed for mild pain or moderate pain. Replaces: acetaminophen 650 MG CR tablet   Adult Gummy Chew Chew 1 tablet by mouth daily.   apixaban 5 MG Tabs tablet Commonly known as: ELIQUIS Take 1 tablet (5 mg total) by mouth 2 (two) times daily.   atorvastatin 40 MG tablet Commonly known as: LIPITOR Take 1 tablet (40 mg total) by mouth daily at 6 PM. What changed:   medication strength  how much to take  when to take this   benzonatate 100 MG capsule Commonly known as: TESSALON Take 1 capsule (100 mg total) by mouth 3 (three) times daily as needed for cough.   Foltanx 3-35-2 MG Tabs Take 1 tablet by mouth daily.   gabapentin 600 MG tablet Commonly known as: NEURONTIN Take 1,200 mg by mouth 2 (two) times daily with breakfast and lunch.   levothyroxine 100 MCG tablet Commonly known as: SYNTHROID Take 100 mcg by mouth daily.   metFORMIN 1000 MG tablet Commonly known as: GLUCOPHAGE Take 1,000 mg by mouth 2 (two) times daily with breakfast and lunch.   VISINE OP Place 1 drop into both eyes daily as needed (dry eyes).       Disposition and follow-up:   EmmaJasmynn [name] was discharged from [facility] in Good condition.  At the  hospital follow up visit please address:  1.   - Left MCA infarcts: Will need follow up with Neurology.  - Type 2 Diabetes Mellitus with Neuropathy: A1c markedly elevated, she would benefit from an additional agent. She is only on Metformin at the moment.  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  Labs / imaging needed at time of follow-up: BMP  3.  Pending labs/ test needing follow-up: None   Follow-up Appointments: Follow-up Information    [facility]. Schedule an appointment as soon as possible for a visit in 4 week(s).   Specialty: Neurology Contact information: [address] [place] [facility] Course by problem list:  1. Left MCA infarcts  Patient presented as code stroke due to inability to use right arm and slurred speech. Particularly she noted difficulty with using her wheelchair. CT head was with chronic small vessel ischemic disease without acute intracranial abnormality. MRI showed small acute infarcts within left frontal lobe with moderate chronic microvascular ischemic changes.   Stroke workup was revealing for CTA head and neck showing bilateral common and internal carotid arteries within neck without stenosis. Mid mixed plaque at carotid bifurcations. CT perfusion showed 74ml area of critically hypoperfused parenchyma in the left frontal lobe mca vascular terrritory. Lipid panel shows tcholes 156,  trig 139, hdl 36, ldl 92. A1c 11.1.   Speech therapy therapy noted reduced lingual strength and dysarthria which was approximately 75% back to baseline.   Concerning for embolic source given imaging findings. TTE with bubble study was unable to be obtained due to inadequate acoustic windows. Transcranial doppler could not be obtained due to unsatisfactory temporal windows. No afib noted on telemetry. Unable to rule out PFO.   Patient was started on and will continue  lipitor 40mg  qd and eliquis 5mg  bid. 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  2. Left lower extremity DVT Found to have an acute left dvt in left lower extremity. Vascular surgery was consulted and they did a ivc/iliac study that did not show any thrombus. She is to follow with vascular on as needed basis.   3. Type 2 Diabetes Mellitus with Neuropathy  A1c elevated at 11.1 on admission but BG have been on the lower end during admission (100-150). This may be due to dysphagia/dysarthria from acute stroke limiting po intake. Home med is only Metformin. She may benefit from another agent on discharge.   Discharge Vitals:   BP (!) 101/53 (BP Location: Right Arm)   Pulse 78   Temp 98.5 F (36.9 C) (Oral)   Resp 18   Ht 5\' 8"  (1.727 m)   Wt 59.9 kg   SpO2 95%   BMI 20.08 kg/m   Pertinent Labs, Studies, and Procedures:   CBC Latest Ref Rng & Units 03/19/2019 03/18/2019 03/14/2019  WBC 4.0 - 10.5 K/uL 5.3 6.7 7.6  Hemoglobin 12.0 - 15.0 g/dL 10.5(L) 11.1(L) 10.6(L)  Hematocrit 36.0 - 46.0 % 33.8(L) 34.4(L) 33.2(L)  Platelets 150 - 400 K/uL 443(H) 390 330   BMP Latest Ref Rng & Units 03/19/2019 03/18/2019 03/14/2019  Glucose 70 - 99 mg/dL 05/19/2019) 05/18/2019) 03/16/2019)  BUN 8 - 23 mg/dL 841(Y) 23 12  Creatinine 0.44 - 1.00 mg/dL 606(T 016(W) 10(X  Sodium 135 - 145 mmol/L 142 138 142  Potassium 3.5 - 5.1 mmol/L 4.1 4.5 4.3  Chloride 98 - 111 mmol/L 102 100 107  CO2 22 - 32 mmol/L 24 22 27   Calcium 8.9 - 10.3 mg/dL 9.3 9.1 3.23)    CT cerebral perfusion w contrast  IMPRESSION: The perfusion software identifies a 6 mL region of critically hypoperfused parenchyma in the left frontal lobe MCA vascular territory utilizing the Tmax>6 seconds threshold. The perfusion software identifies no core infarct. Reported mismatch volume 6 mL. Of note, utilizing a Tmax > 4 seconds threshold, there is a 21 mL region of hypoperfusion in the left MCA territory.  Vas 5.57(D IVC IVC/Iliac: There is no  evidence of thrombus involving the left common  iliac vein.   CTA head and neck  IMPRESSION: CTA neck:  1. The bilateral common and internal carotid arteries are patent within the neck without stenosis. Mild mixed plaque at the carotid bifurcations. 2. The vertebral arteries are patent within the neck bilaterally. Moderate/severe atherosclerotic narrowing at the origin of the right vertebral artery. Mild/moderate atherosclerotic narrowing at the origin of the left vertebral artery. 3. Incidentally noted aberrant right subclavian artery. 4. 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  CTA head:  1. Occlusion or near occlusion of a proximal to mid left M2 MCA branch vessel as described. 2. 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 Sites of up to moderate stenosis within the cavernous segments  bilaterally. 3. Mild atherosclerotic narrowing of the V4 left vertebral artery.  MRI IMPRESSION:  Small acute infarcts within the left frontal lobe.  Moderate chronic microvascular ischemic changes.  TTE IMPRESSIONS    1. Abnormal septal motion. Left ventricular ejection fraction, by  estimation, is 50 to 55%. The left ventricle has low normal function. The  left ventricle has no regional wall motion abnormalities. Left ventricular  diastolic parameters were normal.  2. Right ventricular systolic function is normal. The right ventricular  size is normal.  3. Bubble study not performed.  4. The mitral valve is normal in structure and function. No evidence of  mitral valve regurgitation. No evidence of mitral stenosis.  5. The aortic valve is normal in structure and function. Aortic valve  regurgitation is not visualized. No aortic stenosis is present.  6. The inferior vena cava is normal in size with greater than 50%  respiratory variability, suggesting right atrial  pressure of 3 mmHg.   Lower extremity dvt study  RIGHT:  - There is no evidence of deep vein thrombosis in the CFV, FV prox, and  prof. Right above knee amputation.    LEFT:  - Findings consistent with acute deep vein thrombosis involving the left  common femoral vein, SF junction, left femoral vein, left proximal  profunda vein, left popliteal vein, left posterior tibial veins, left  peroneal veins, and EIV.  - No cystic structure found in the popliteal fossa.   Discharge Instructions: Discharge Instructions    Ambulatory referral to Neurology   Complete by: As directed    Follow up with stroke clinic NP (Jessica Vanschaick or Darrol Angel, if both not available, consider Manson Allan, or Ahern) at [facility] in about 4 weeks. Thanks.   Call MD for:  persistant dizziness or light-headedness   Complete by: As directed    Diet - low sodium heart healthy   Complete by: As directed    Diet - low sodium heart healthy   Complete by: As directed    Diet - low sodium heart healthy   Complete by: As directed    Discharge instructions   Complete by: As directed    Emma [name],  It was a pleasure taking care of you here in the hospital.  You were admitted because of a stroke.  Moving forward, he would be on a blood thinning medicine for life in order to decrease the risk and chance of another stroke.  We are discharging you to a rehab facility in order for you to get some strength back.  Take care!   Increase activity slowly   Complete by: As directed    Increase activity slowly   Complete by: As directed    Increase activity slowly   Complete by: As directed       Signed:  Dr. Verdene Lennert Internal Medicine PGY-1  Pager: [phone] 03/20/2019, 12:52 PM

## 2019-03-15 NOTE — Progress Notes (Signed)
VASCULAR LAB PRELIMINARY  PRELIMINARY  PRELIMINARY  PRELIMINARY  Duplex of IVC/Iliac completed.    Preliminary report:  See CV proc for preliminary results.   Arieonna Medine, RVT 03/15/2019, 8:43 AM

## 2019-03-15 NOTE — Progress Notes (Signed)
Internal Medicine Attending:   I saw and examined the patient. I reviewed the resident's note and I agree with the resident's findings and plan as documented in the resident's note.  Patient is upset and tearful today.  States that she wants to go home.  When explained to her that neurology wanted to do a transcranial Doppler on Monday she became tearful and stated that we only wanted to keep her here to get more money.  Patient still with some slurred speech as well as right upper extremity weakness.  Patient initially made to the hospital with small left frontal lobe CVA likely secondary to an embolic etiology.  Patient noted to have an extensive left lower extremity clot.  Vascular surgery follow-up and recommendations appreciated.  We will continue with anticoagulation with Eliquis for now.  We explained to the patient that PT/OT are recommending SNF placement.  Patient adamantly refuses placement in SNF and wants to go home today.  We will attempt to arrange for home PT/OT.  Continue with Lipitor 40 mg daily.  No further work-up at this time.

## 2019-03-16 LAB — GLUCOSE, CAPILLARY
Glucose-Capillary: 141 mg/dL — ABNORMAL HIGH (ref 70–99)
Glucose-Capillary: 143 mg/dL — ABNORMAL HIGH (ref 70–99)
Glucose-Capillary: 149 mg/dL — ABNORMAL HIGH (ref 70–99)
Glucose-Capillary: 155 mg/dL — ABNORMAL HIGH (ref 70–99)
Glucose-Capillary: 155 mg/dL — ABNORMAL HIGH (ref 70–99)
Glucose-Capillary: 177 mg/dL — ABNORMAL HIGH (ref 70–99)

## 2019-03-16 MED ORDER — ACETAMINOPHEN 325 MG PO TABS
650.0000 mg | ORAL_TABLET | Freq: Four times a day (QID) | ORAL | Status: DC | PRN
  Administered 2019-03-16 (×2): 650 mg via ORAL
  Filled 2019-03-16 (×3): qty 2

## 2019-03-16 MED ORDER — GABAPENTIN 300 MG PO CAPS
600.0000 mg | ORAL_CAPSULE | Freq: Three times a day (TID) | ORAL | Status: DC
  Administered 2019-03-16 – 2019-03-20 (×13): 600 mg via ORAL
  Filled 2019-03-16 (×13): qty 2

## 2019-03-16 MED ORDER — LEVOTHYROXINE SODIUM 100 MCG PO TABS
100.0000 ug | ORAL_TABLET | Freq: Every day | ORAL | Status: DC
  Administered 2019-03-16 – 2019-03-20 (×5): 100 ug via ORAL
  Filled 2019-03-16 (×4): qty 1

## 2019-03-16 MED ORDER — POLYETHYLENE GLYCOL 3350 17 G PO PACK
17.0000 g | PACK | Freq: Every day | ORAL | Status: DC
  Administered 2019-03-16 – 2019-03-19 (×4): 17 g via ORAL
  Filled 2019-03-16 (×4): qty 1

## 2019-03-16 NOTE — Progress Notes (Addendum)
Subjective:  Emma [name] was evaluated at bedside this AM. She states that her peripheral neuropathy is still bothering her, and that she takes 2400 total dose at home. Otherwise she is doing "okay." All questions and concerns were addressed.   Objective:  Vital signs in last 24 hours: Vitals:   03/15/19 1619 03/15/19 2028 03/15/19 2355 03/16/19 0452  BP: 116/68 130/69 122/60 109/64  Pulse: 84 86 80 72  Resp: 16 18 18    Temp: 99.5 F (37.5 C) 99.7 F (37.6 C) 98.2 F (36.8 C) 97.6 F (36.4 C)  TempSrc: Oral Oral Oral Oral  SpO2: 95% 93% 95% 99%  Weight:      Height:       Physical Exam Constitutional:      General: She is not in acute distress.    Appearance: Normal appearance. She is not ill-appearing or toxic-appearing.  HENT:     Head: Normocephalic and atraumatic.     Right Ear: External ear normal.     Left Ear: External ear normal.     Nose: Nose normal.  Eyes:     General: No scleral icterus.    Conjunctiva/sclera: Conjunctivae normal.  Cardiovascular:     Rate and Rhythm: Normal rate and regular rhythm.     Pulses: Normal pulses.     Heart sounds: Normal heart sounds. No murmur. No friction rub. No gallop.   Pulmonary:     Effort: Pulmonary effort is normal.     Breath sounds: Wheezing present. No rhonchi or rales.     Comments: Small wheezes appreciated in the Upper R and L Lobes.  Chest:     Chest wall: No tenderness.  Abdominal:     General: Bowel sounds are normal.     Palpations: Abdomen is soft.     Tenderness: There is no abdominal tenderness. There is no guarding.  Skin:    General: Skin is warm and dry.  Neurological:     Mental Status: She is oriented to person, place, and time.  Psychiatric:        Mood and Affect: Mood normal.        Behavior: Behavior normal.       Assessment/Plan:  Principal Problem:   Stroke (cerebrum) (HCC) Active Problems:   TIA (transient ischemic attack)   DVT (deep venous thrombosis) (HCC)  Ms.  [name] is a 74 y.o f with diabetes mellitus 2, hypothyroidism who presented with right sided weakness, dysphagia, dysarthria that showed small acute infarcts in left frontal lobbe.   L. Frontal Lobe Infarcts w/ LLE DVT:  Imaging consistent with small acute infarcts within the L Frontal Lobe appreciated on CT head. LLE Dopple showing DVT in common femoral, femoral, proximal profunda, popliteal vein, posterior tibial veins, L peroneal veins, and EIV.  - TCD bubble study scheduled for 03/17/2019 - Patient will require lifelong Eliquis 5 mg for DVT and stroke.  - Appreciate vascular surgery's recommendations - Continue Lipitor 40mg  QD   Diabetes Mellitis Type II:  Patient with previous A1c of 6.4 now with 11.1 on admission. Was on 1500 mg QD of Metformin and glimepiride 4 mg QD at home with gabapentin and trileptal for diabetic neuropathy. Patient at bedside with complaints of neuropathy stating that she is on 2400 mg gabapentin at home. Upon Chart review patient has been on an 1800 mg dose in the past.  - A1c: 11.1 - SSI - Increased gabapentin 600 mg TID.   Hypothyroidism with Soft tissue Mass on  Imaging:  Patient with 41mm soft tissue mass observed on CT neck below the hyoid bone. 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  - Continue synthroid  - Follow up outpatient.   Acute Kidney Injury (Resolved) Dispo: Anticipated discharge 1-2 days.  Emma Mercury, MD 03/16/2019, 6:06 AM

## 2019-03-17 ENCOUNTER — Inpatient Hospital Stay (HOSPITAL_COMMUNITY): Payer: Medicare HMO

## 2019-03-17 ENCOUNTER — Other Ambulatory Visit (HOSPITAL_COMMUNITY): Payer: Medicare HMO

## 2019-03-17 DIAGNOSIS — R05 Cough: Secondary | ICD-10-CM

## 2019-03-17 DIAGNOSIS — I82432 Acute embolism and thrombosis of left popliteal vein: Secondary | ICD-10-CM

## 2019-03-17 DIAGNOSIS — I824Y2 Acute embolism and thrombosis of unspecified deep veins of left proximal lower extremity: Secondary | ICD-10-CM

## 2019-03-17 DIAGNOSIS — I82452 Acute embolism and thrombosis of left peroneal vein: Secondary | ICD-10-CM

## 2019-03-17 DIAGNOSIS — I82442 Acute embolism and thrombosis of left tibial vein: Secondary | ICD-10-CM

## 2019-03-17 LAB — GLUCOSE, CAPILLARY
Glucose-Capillary: 111 mg/dL — ABNORMAL HIGH (ref 70–99)
Glucose-Capillary: 128 mg/dL — ABNORMAL HIGH (ref 70–99)
Glucose-Capillary: 131 mg/dL — ABNORMAL HIGH (ref 70–99)
Glucose-Capillary: 135 mg/dL — ABNORMAL HIGH (ref 70–99)
Glucose-Capillary: 152 mg/dL — ABNORMAL HIGH (ref 70–99)
Glucose-Capillary: 201 mg/dL — ABNORMAL HIGH (ref 70–99)
Glucose-Capillary: 205 mg/dL — ABNORMAL HIGH (ref 70–99)

## 2019-03-17 MED ORDER — BENZONATATE 100 MG PO CAPS
100.0000 mg | ORAL_CAPSULE | Freq: Three times a day (TID) | ORAL | Status: DC | PRN
  Administered 2019-03-17 – 2019-03-18 (×2): 100 mg via ORAL
  Filled 2019-03-17 (×2): qty 1

## 2019-03-17 NOTE — TOC Benefit Eligibility Note (Signed)
Transition of Care [facility]) Benefit Eligibility Note    Patient Details  Name: Emma [name] MRN: [id number] Date of Birth: [birth year]   Medication/Dose: Arne Cleveland    5 MG BID  Covered?: Yes  Tier: 3 Drug  Prescription Coverage Preferred Pharmacy: [facility] with Person/Company/Phone Number:: [id number]   @ [employer] NP # [phone]  Co-Pay: $ 47.00  Prior Approval: No  Deductible: Met       Memory Argue Phone Number: 03/17/2019, 10:22 AM

## 2019-03-17 NOTE — Progress Notes (Signed)
TCD bubble study attempted, however patient has inadequate acoustic windows. This order will be canceled, per Dr. Pearlean Brownie.  03/17/2019 4:13 PM Gertie Fey, MHA, RVT, RDCS, RDMS

## 2019-03-17 NOTE — Progress Notes (Signed)
Internal Medicine Attending Note:  I have seen and evaluated this patient and I have discussed the plan of care with the house staff. Please see their note for complete details. I concur with their findings.  Reymundo Poll, MD 03/17/2019, 6:56 PM

## 2019-03-17 NOTE — Progress Notes (Addendum)
Physical Therapy Treatment Patient Details Name: Emma [name] MRN: [id number] DOB: [birth year] Today's Date: 03/17/2019    History of Present Illness The pt is a 74 yo female presenting due to onset of R-sided weakness and slurred speech. CT revelaed acute L MCA stroke and small infarcts in L frontal lobe. PMH includes: hypertension, hyperlipidemia, diabetes, and RLE AKA.    PT Comments    Pt frustrated on arrival with lack of mobility in Her RUE.  Focused on sitting balance, transfers and ROM to RUE.  Pt presents with increased strength proximally but unable to squeeze PTAs hand.  Continue to recommend rehab in a post acute setting but patient is refusing SNF placement so she will need non emergent medical transportation home and HHPT if she continues to refuse.      Follow Up Recommendations  SNF;Supervision/Assistance - 24 hour     Equipment Recommendations  (pt has WC defer to post acute)    Recommendations for Other Services       Precautions / Restrictions Precautions Precautions: Fall Precaution Comments: pt with previous R AKA, does not use prosthesis Restrictions Weight Bearing Restrictions: No    Mobility  Bed Mobility Overal bed mobility: Needs Assistance Bed Mobility: Supine to Sit;Sit to Supine     Supine to sit: Mod assist Sit to supine: Mod assist   General bed mobility comments: Pt required heavy use of rail to move from supine to sit.  To return to bed she required mod +1 and bed placed in trend down to scoot to [facility].  Transfers Overall transfer level: Needs assistance Equipment used: (held to hand grips on back of chair.) Transfers: Sit to/from Stand Sit to Stand: Mod assist         General transfer comment: Mod assistance from elevated bed.  Pt pulling with LUE into standing.  L knee flexed, trunk flexed and B hips flexed.  Ambulation/Gait Ambulation/Gait assistance: (Pt uses WC at baseline.)               Stairs              Wheelchair Mobility    Modified Rankin (Stroke Patients Only)       Balance Overall balance assessment: Needs assistance   Sitting balance-Leahy Scale: Poor Sitting balance - Comments: with dynamic movements,     Standing balance-Leahy Scale: Poor Standing balance comment: relaint on BUE support and modA of 1               High Level Balance Comments: Performed reaching with clapsed hand outside BOS into multiple planes.  3x10 reps.            Cognition Arousal/Alertness: Awake/alert Behavior During Therapy: Flat affect Overall Cognitive Status: No family/caregiver present to determine baseline cognitive functioning                                 General Comments: Followed all commands and oriented      Exercises Other Exercises Other Exercises: R AAROM shoulder flexion/extension, elbow flexion extension 1x10 reps. Other Exercises: R shoulder shrug 1x10 AROM, slow response. Other Exercises: Unable to squeeze with R hand.  Focused on weight bearing in RUE to hand and elbow.    General Comments        Pertinent Vitals/Pain Pain Assessment: No/denies pain Faces Pain Scale: Hurts a little bit Pain Location: generalized Pain Descriptors / Indicators: Discomfort Pain Intervention(s): Monitored during  session;Repositioned    Home Living                      Prior Function            PT Goals (current goals can now be found in the care plan section) Acute Rehab PT Goals Patient Stated Goal: to get back to work Potential to Achieve Goals: Good Progress towards PT goals: Progressing toward goals    Frequency    Min 3X/week      PT Plan Current plan remains appropriate    Co-evaluation              AM-PAC PT "6 Clicks" Mobility   Outcome Measure  Help needed turning from your back to your side while in a flat bed without using bedrails?: A Little Help needed moving from lying on your back to sitting on the  side of a flat bed without using bedrails?: A Little Help needed moving to and from a bed to a chair (including a wheelchair)?: A Lot Help needed standing up from a chair using your arms (e.g., wheelchair or bedside chair)?: A Lot Help needed to walk in hospital room?: Total Help needed climbing 3-5 steps with a railing? : Total 6 Click Score: 12    End of Session Equipment Utilized During Treatment: Gait belt Activity Tolerance: Patient tolerated treatment well Patient left: in bed;with call bell/phone within reach Nurse Communication: Mobility status PT Visit Diagnosis: Difficulty in walking, not elsewhere classified (R26.2);Repeated falls (R29.6);Muscle weakness (generalized) (M62.81);Hemiplegia and hemiparesis Hemiplegia - Right/Left: Right Hemiplegia - dominant/non-dominant: Non-dominant Hemiplegia - caused by: Cerebral infarction     Time: 6269-4854 PT Time Calculation (min) (ACUTE ONLY): 33 min  Charges:  $Therapeutic Exercise: 8-22 mins $Therapeutic Activity: 8-22 mins                     Erasmo Leventhal , PTA Acute Rehabilitation Services Pager [phone] Office [phone]     Tracen Mahler Eli Hose 03/17/2019, 5:23 PM

## 2019-03-17 NOTE — Progress Notes (Signed)
Subjective:   Emma [name] states she would still like to go home today.  She is frustrated that bubble study still has not occurred.  On reevaluation in the p.m., Emma [name] is complaining of a nonproductive cough present for 3 days since arrival to hospital.  She states that it occurs intermittently throughout the day no known triggers.  She adamantly denies that she had any cough prior to arrival.  Denies history of COPD or other pulmonary issues.  Objective:  Vital signs in last 24 hours: Vitals:   03/16/19 2021 03/16/19 2040 03/17/19 0003 03/17/19 0330  BP: (!) 84/43 102/63 104/61 (!) 100/58  Pulse: 90  88 86  Resp: 18  18 18   Temp: 98.6 F (37 C)  97.8 F (36.6 C) 98.2 F (36.8 C)  TempSrc: Oral  Oral Oral  SpO2: 94%  95% 94%  Weight:      Height:        Physical Exam Vitals and nursing note reviewed.  Constitutional:      General: She is not in acute distress.    Appearance: She is normal weight.  HENT:     Mouth/Throat:     Comments: Not clearing oral secretions consistently.  Pulmonary:     Effort: No tachypnea, bradypnea, accessory muscle usage or respiratory distress.     Breath sounds: Decreased breath sounds (diffusely, poor effort though) present. No wheezing.  Skin:    General: Skin is warm and dry.  Neurological:     Mental Status: She is alert.     Cranial Nerves: Dysarthria present. No facial asymmetry.     Motor: Weakness present.     Comments: 1/5 upper and lower right extremity strength. 5/5 on left.     Assessment/Plan:  Principal Problem:   Stroke (cerebrum) (HCC) Active Problems:   TIA (transient ischemic attack)   DVT (deep venous thrombosis) ([place])  Emma [name] is a 74 y.o f with diabetes mellitus 2, hypothyroidism who presented with right sided weakness, dysphagia, dysarthria that showed small acute infarcts in left frontal lobbe.   # Left MCA infarcts  CT head showed multiple left frontal infarcts concerning for embolic  source. No atrial fibrillation history of evidence of this admission. 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 No thrombus seen on TTE initially but plan to obtain echo with bubble study to evaluate for PFO.   PT/OT recommended SNF initially, but patient requests [facility] on discharge. She has no social support, lives alone, so this is concerning but she is extremely adamant that she is not willing to go to SNF.   - TTE with bubble study pending - Discharge once completed  - Elliquis 5 mg BID - Lipitor 40 mg  - HH on discharge   # LLE DVT - multiple LLE Dopple showing DVT in common femoral, femoral, proximal profunda, popliteal vein, posterior tibial veins, L peroneal veins, and EIV. No thrombus in iliac though. Patient will require lifelong Elliquis due to recurrent nature of DVTs. Dose may be eventually increased to 10 mg BID.   - Vascular surgery as evaluated; no surgical intervention indicated at this time.  - Elliquis 5 mg BID  # T2DM # Diabetic Neuropathy  A1c increased to 11.1% this admission, compared to previous 6.4%. Home medications include Metformin and Glimepiride. For neuropathy, she was prescribed Gabapentin (1800 mg total per day) and Trileptal.   - SSI (moderate) - Gabapentin 600 mg TID   # Dry cough Emma [name] complains of a  cough for 3 days that is nonproductive.  No evidence of hypoxia, fevers.  Given recent stroke with dysarthria and initially failed swallow study, most likely related to aspiration pneumonitis.  Patient declined chest x-ray for further evaluation.  She would like symptomatic treatment though.  - Tessalon pearls 100 mg TID  # Hypothyroidism  Patient with 25mm soft tissue mass observed on CT neck below the hyoid bone. 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  Recommend outpatient follow up.  - Continue Synthroid   # AKI - Resolved    Dispo: Anticipated discharge in approximately 0-1 days.   Dr. Verdene Lennert Internal Medicine PGY-1  Pager:  [phone] 03/17/2019, 6:48 AM

## 2019-03-17 NOTE — Progress Notes (Signed)
  Speech Language Pathology Treatment: Dysphagia;Cognitive-Linquistic  Patient Details Name: Emma [name] MRN: [id number] DOB: [birth year] Today's Date: 03/17/2019 Time: 5465-0354 SLP Time Calculation (min) (ACUTE ONLY): 23 min  Assessment / Plan / Recommendation Clinical Impression  Patient seen for skilled ST at bedside. Patient presents with dysarthria and reduced intelligibility characterized by articulatory imprecision. Pt approximately 75% intelligible at the phrase level, decreasing to 60% at the conversational level. ST provided edu re: strategies to target increased intelligibility including slowing rate, overarticulation and pacing. Pt led in exercises to target carryover and use of these strategies, she reports she has been slowing rate when speaking with her son via the telephone. Pt needing fading verbal cues to use strategies. ST to continue to follow as per POC for dysarthria and cognitive-communication.  Pt with coughing noted during session (outside PO intake). Pt brushed teeth w/ ST providing set-up assistance. Min anterior labial spillage of water observed when patient rinsing mouth. She was seen with thin liquids self-administered via straw sips. No overt s/sx aspiration seen despite thorough challenging. Pt also seen with regular solids (graham cracker), minimally prolonged mastication 2/2 dentition, but adequate bolus manipulation and cohesion, good AP transport and swallow initiation appeared to be timely. No overt s/sx aspiration.  ST edu pt re: sitting upright for PO intake, frequent oral care. Recommend continuation of regular solids/thin liquids. ST to sign-off for swallowing goals, please re-consult should patient demonstrate increased s/sx dysphagia.    HPI HPI: Pt is a 74 year old female with a past medical history significant for PVD (s/p R AKA), HTN, HLD, hypothyroidism, T2DM, who presented with right-sided weakness and slurred speech upon awakening from her sleep. MRI  of the brain: Small acute infarcts within the left frontal lobe. Pt failed [facility] swallow screen due to coughing.       SLP Plan  Continue with current plan of care;Other (Comment)(signed off for swallowing)       Recommendations  Diet recommendations: Regular;Thin liquid                Follow up Recommendations: Home health SLP;24 hour supervision/assistance SLP Visit Diagnosis: Dysarthria and anarthria (R47.1);Dysphagia, oral phase (R13.11) Plan: Continue with current plan of care;Other (Comment)(signed off for swallowing)       GO                Lannie Heaps 03/17/2019, 12:02 PM  Shella Spearing, M.Ed., CCC-SLP Speech Therapy Acute Rehabilitation [phone]: Acute Rehab office [phone] - pager

## 2019-03-18 DIAGNOSIS — I639 Cerebral infarction, unspecified: Secondary | ICD-10-CM

## 2019-03-18 LAB — BASIC METABOLIC PANEL
Anion gap: 16 — ABNORMAL HIGH (ref 5–15)
BUN: 23 mg/dL (ref 8–23)
CO2: 22 mmol/L (ref 22–32)
Calcium: 9.1 mg/dL (ref 8.9–10.3)
Chloride: 100 mmol/L (ref 98–111)
Creatinine, Ser: 1.02 mg/dL — ABNORMAL HIGH (ref 0.44–1.00)
GFR calc Af Amer: >60 mL/min (ref >60)
GFR calc non Af Amer: 55 mL/min — ABNORMAL LOW (ref >60)
Glucose, Bld: 151 mg/dL — ABNORMAL HIGH (ref 70–99)
Potassium: 4.5 mmol/L (ref 3.5–5.1)
Sodium: 138 mmol/L (ref 135–145)

## 2019-03-18 LAB — GLUCOSE, CAPILLARY
Glucose-Capillary: 101 mg/dL — ABNORMAL HIGH (ref 70–99)
Glucose-Capillary: 124 mg/dL — ABNORMAL HIGH (ref 70–99)
Glucose-Capillary: 131 mg/dL — ABNORMAL HIGH (ref 70–99)
Glucose-Capillary: 157 mg/dL — ABNORMAL HIGH (ref 70–99)
Glucose-Capillary: 161 mg/dL — ABNORMAL HIGH (ref 70–99)
Glucose-Capillary: 168 mg/dL — ABNORMAL HIGH (ref 70–99)

## 2019-03-18 LAB — CBC WITH DIFFERENTIAL/PLATELET
Abs Immature Granulocytes: 0.05 10*3/uL (ref 0.00–0.07)
Basophils Absolute: 0 10*3/uL (ref 0.0–0.1)
Basophils Relative: 0 %
Eosinophils Absolute: 0.4 10*3/uL (ref 0.0–0.5)
Eosinophils Relative: 5 %
HCT: 34.4 % — ABNORMAL LOW (ref 36.0–46.0)
Hemoglobin: 11.1 g/dL — ABNORMAL LOW (ref 12.0–15.0)
Immature Granulocytes: 1 %
Lymphocytes Relative: 16 %
Lymphs Abs: 1.1 10*3/uL (ref 0.7–4.0)
MCH: 30.7 pg (ref 26.0–34.0)
MCHC: 32.3 g/dL (ref 30.0–36.0)
MCV: 95.3 fL (ref 80.0–100.0)
Monocytes Absolute: 0.4 10*3/uL (ref 0.1–1.0)
Monocytes Relative: 6 %
Neutro Abs: 4.8 10*3/uL (ref 1.7–7.7)
Neutrophils Relative %: 72 %
Platelets: 390 10*3/uL (ref 150–400)
RBC: 3.61 MIL/uL — ABNORMAL LOW (ref 3.87–5.11)
RDW: 13.7 % (ref 11.5–15.5)
WBC: 6.7 10*3/uL (ref 4.0–10.5)
nRBC: 0 % (ref 0.0–0.2)

## 2019-03-18 LAB — MAGNESIUM: Magnesium: 1.9 mg/dL (ref 1.7–2.4)

## 2019-03-18 NOTE — Progress Notes (Signed)
STROKE TEAM PROGRESS NOTE   INTERVAL HISTORY I personally reviewed history of presenting illness, electronic medical records and imaging films and PACS.  Patient presented with expressive aphasia and speech difficulties and MRI scan shows small left MCA branch infarct with MRI showing M2/M3 stenosis.  Patient also has DVT and has been started on anticoagulation with Eliquis.  Vitals:   03/17/19 2304 03/18/19 0346 03/18/19 0500 03/18/19 0732  BP: 110/68 95/67  (!) 112/54  Pulse: 95 94  90  Resp: 18 19  20   Temp: 99.1 F (37.3 C) 99.5 F (37.5 C)  99.2 F (37.3 C)  TempSrc: Oral Oral  Oral  SpO2: 94% 100%  93%  Weight:   62.2 kg   Height:        CBC:  Recent Labs  Lab 03/13/19 1628 03/13/19 1628 03/13/19 1645 03/14/19 0341  WBC 8.5  --   --  7.6  NEUTROABS 5.9  --   --   --   HGB 11.2*   < > 11.6* 10.6*  HCT 35.1*   < > 34.0* 33.2*  MCV 95.1  --   --  95.1  PLT 354  --   --  330   < > = values in this interval not displayed.    Basic Metabolic Panel:  Recent Labs  Lab 03/14/19 0341 03/18/19 0830  NA 142 138  K 4.3 4.5  CL 107 100  CO2 27 22  GLUCOSE 120* 151*  BUN 12 23  CREATININE 0.97 1.02*  CALCIUM 8.8* 9.1  MG 1.6* 1.9   Lipid Panel:     Component Value Date/Time   CHOL 156 03/14/2019 0341   TRIG 139 03/14/2019 0341   HDL 36 (L) 03/14/2019 0341   CHOLHDL 4.3 03/14/2019 0341   VLDL 28 03/14/2019 0341   LDLCALC 92 03/14/2019 0341   HgbA1c:  Lab Results  Component Value Date   HGBA1C 11.1 (H) 03/14/2019   Urine Drug Screen:     Component Value Date/Time   LABOPIA NONE DETECTED 03/14/2019 0516   COCAINSCRNUR NONE DETECTED 03/14/2019 0516   LABBENZ NONE DETECTED 03/14/2019 0516   AMPHETMU NONE DETECTED 03/14/2019 0516   THCU NONE DETECTED 03/14/2019 0516   LABBARB NONE DETECTED 03/14/2019 0516    IMAGING past 24 hours No results found.  PHYSICAL EXAM  General - Well nourished, well developed, in no apparent distress.  Ophthalmologic -  fundi not visualized due to noncooperation.  Cardiovascular - Regular rhythm and rate.  Mental Status -  Level of arousal and orientation to time, place, and person were intact. Language including expression, naming, repetition, comprehension was assessed and found intact.  Mild to moderate dysarthria as well as mild expressive aphasia with some word finding difficulties and disfluency.  No paraphasic errors.  Good comprehension. Fund of Knowledge was assessed and was intact.  Cranial Nerves II - XII - II - Visual field intact OU. III, IV, VI - Extraocular movements intact. V - Facial sensation intact bilaterally. VII - mild right facial droop. VIII - Hearing & vestibular intact bilaterally. X - Palate elevates symmetrically. XI - Chin turning & shoulder shrug intact bilaterally. XII - Tongue protrusion intact.  Motor Strength - The patient's strength was normal in all extremities except right pronator drift and right finger grip 4/5.  Right lower extremity AKA but strength 5/5 proximally.  Bulk was normal and fasciculations were absent.    Diminished fine finger movements on the right and orbits left over right  upper extremity. Motor Tone - Muscle tone was assessed at the neck and appendages and was normal.  Reflexes - The patient's reflexes were symmetrical in all extremities and she had no pathological reflexes.  Sensory - Light touch, temperature/pinprick were assessed and were symmetrical.    Coordination - The patient had normal movements in the hands with no ataxia or dysmetria.  Tremor was absent.  Gait and Station - deferred.  ASSESSMENT/PLAN [name] is a 74 y.o. female with history of hypertension, hyperlipidemia, diabetes presenting with R sided weakness.   Stroke:   Small L frontal lobe infarcts, embolic pattern, could be due to extensive LE DVT with ? PFO or occult afib if no PFO  Code Stroke CT head No acute abnormality. Moderate Small vessel  disease. Atrophy.   CTA head mid L M2 occlusion/near occlusion. Intracranial ICA atherosclerosis. B cavernous ICA moderate stenosis. L V4 mid atherosclerosis.   CTA neck B ICA bifurcation w/ mixed plaque. R VA origin moderate/severe atherosclerosis. L VA origin mild/mod atherosclerosis. 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  CT perfusion no core infarct. 32mL L frontal lobe penumbra w/ TMax>6, w/ TMax>4 the 35mL penumbra  MRI  Small L frontal lobe infarcts. Moderate small vessel disease.   2D Echo EF 50-55%. No source of embolus   LE doppler left common femoral vein, SF junction, left femoral vein, left proximal profunda vein, left popliteal vein, left posterior tibial veins, left peroneal veins, and EIV  LDL 92  HgbA1c 11.1  Eliquis for VTE prophylaxis  Recommend TCD bubble study to rule out PFO.  Had recommended If patient able to stay until next Monday we will do TCD bubble study on Monday, otherwise will do as outpatient. Patient adamant on Friday that she was going home.   Recommend lifelong anticoagulation given DVT with no activity, no further cardioembolic work-up needed.  aspirin 81 mg daily prior to admission, now on Eliquis 5 mg twice daily for DVT treatment as well as for stroke prevention.  Patient stroke is small, low risk of hemorrhagic conversion at this time.  Therapy recommendations:  SNF  Disposition:  pending   LLE DVT  LE doppler R AKA, L DVT common femoral, SF jxn, femoral, proximal profunda, popliteal, posterior tib, peroneal and EIV.  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  Do not recommend 10 mg twice daily initial dosing due to acute stroke.  Hypertension  Stable . BP goal now normotensive  Hyperlipidemia  Home meds:  lipitor 10  Now on lipitor 40  LDL 92, goal < 70  Continue  statin at discharge  Diabetes type II Uncontrolled Diabetic Neuropathy  HgbA1c 11.1, goal < 7.0  Other Stroke Risk Factors  Advanced age  Cigarette smoker, advised to stop smoking  ETOH use, advised to drink no more than 1 drink(s) a day  Other Active Problems  Hypothyroidism  W/ soft tissue mass on imaging  Right AKA  Dry cough on [facility] day # 5 She presented with embolic left MCA infarct etiology indeterminate but in the circumstances of acute DVT paradoxical embolism is a consideration.  Patient clearly needs to be on anticoagulation at least for the next 6 months hence determining whether she has paroxysmal A. fib or a PFO is not going to change treatment in the short-term and so patient is not willing to stay  this can be deferred.  Greater than 50% time during this 35-minute visit was spent on counseling and coordination of care and discussion with care team.  Discussed with Dr.Basaraba Delia Heady, MD To contact Stroke Continuity provider, please refer to [email or web address]. After hours, contact General Neurology

## 2019-03-18 NOTE — Progress Notes (Signed)
Internal Medicine Attending Note:  I have seen and evaluated this patient and I have discussed the plan of care with the house staff. Please see their note for complete details. I concur with their findings.  Reymundo Poll, MD 03/18/2019, 6:19 PM

## 2019-03-18 NOTE — Progress Notes (Signed)
   Subjective:   Ms. Derden reports she is doing okay today but is upset about not being discharged yet. She is adamant about wanting to go home. She would like [place] to update her son as well.    Objective:  Vital signs in last 24 hours: Vitals:   03/17/19 1931 03/17/19 2304 03/18/19 0346 03/18/19 0500  BP: 119/64 110/68 95/67   Pulse: 91 95 94   Resp: 18 18 19    Temp: 99.2 F (37.3 C) 99.1 F (37.3 C) 99.5 F (37.5 C)   TempSrc: Oral Oral Oral   SpO2: 97% 94% 100%   Weight:    62.2 kg  Height:        Physical Exam Vitals and nursing note reviewed.  Constitutional:      General: She is not in acute distress. Cardiovascular:     Rate and Rhythm: Normal rate and regular rhythm.     Heart sounds: No murmur.  Pulmonary:     Effort: Pulmonary effort is normal. No respiratory distress.     Breath sounds: No wheezing.  Skin:    General: Skin is warm and dry.  Neurological:     Mental Status: She is alert and oriented to person, place, and time.     Cranial Nerves: Dysarthria present. No facial asymmetry.     Motor: Weakness (1/5 right upper extremity strength, 5/5 left upper extremity strength) present.  Psychiatric:        Mood and Affect: Mood normal.        Behavior: Behavior normal.    Assessment/Plan:  Principal Problem:   Stroke (cerebrum) (HCC) Active Problems:   TIA (transient ischemic attack)   DVT (deep venous thrombosis) (HCC)  Ms. Weng is a 74 y.o f with diabetes mellitus 2, hypothyroidism who presented with right sided weakness, dysphagia, dysarthria that showed small acute infarcts in left frontal lobbe.  # Left MCA infarcts  Bubble study was unable to be obtained yesterday due to inadequate windows. She is requesting to go home today with PTAR. I discussed this with her son, who will see if she is willing to change her mind about rehab. It would be the recommended course to go to SNF, as patient is profoundly weak and lives alone with no  support system in the area. If not, plan is to discharge with close outpatient follow up.   - Elliquis 5 mg BID - Lipitor 40 mg  - HH on discharge   # LLE DVT - multiple - Vascular surgery as evaluated; no surgical intervention indicated at this time.  - Elliquis 5 mg BID, lifetime   # T2DM # Diabetic Neuropathy  - SSI (moderate) - Gabapentin 600 mg TID   # Dry cough Improved today with Tessalon. Will provide with short course on discharge.   - Tessalon pearls 100 mg TID   Dispo: Anticipated discharge today vs SNF placement. Will contact son this PM regarding decision.  Dr. 62 Internal Medicine PGY-1  Pager: [phone] 03/18/2019, 6:09 AM

## 2019-03-18 NOTE — TOC Progression Note (Signed)
Transition of Care [facility]) - Progression Note    Patient Details  Name: Emma [name] MRN: [id number] Date of Birth: [birth year]  Transition of [facility]) CM/SW Contact  Baldemar Lenis, [place] Phone Number: 03/18/2019, 3:58 PM  Clinical Narrative:   CSW spoke with patient about SNF, and patient is now agreeable after talking with her son. CSW discussed with patient how it will only be short-term, and she can have a choice in where she goes, and patient is more agreeable although she isn't happy about it and wishes she could go home instead. CSW to fax out referral and provide choice to patient.     Expected Discharge Plan: Skilled Nursing Facility Barriers to Discharge: Continued Medical Work up, English as a second language teacher  Expected Discharge Plan and Services Expected Discharge Plan: Skilled Nursing Facility     Post Acute Care Choice: Skilled Nursing Facility Living arrangements for the past 2 months: Single Family Home Expected Discharge Date: 03/15/19                                     Social Determinants of Health (SDOH) Interventions    Readmission Risk Interventions No flowsheet data found.

## 2019-03-18 NOTE — Progress Notes (Addendum)
Inpatient Diabetes Program Recommendations  AACE/ADA: New Consensus Statement on Inpatient Glycemic Control (2015)  Target Ranges:  Prepandial:   less than 140 mg/dL      Peak postprandial:   less than 180 mg/dL (1-2 hours)      Critically ill patients:  140 - 180 mg/dL   Lab Results  Component Value Date   GLUCAP 168 (H) 03/18/2019   HGBA1C 11.1 (H) 03/14/2019    Review of Glycemic Control Results for Emma [name], Emma [name] (MRN [id number]) as of 03/18/2019 12:49  Ref. Range 03/17/2019 20:09 03/17/2019 23:04 03/18/2019 03:49 03/18/2019 07:29 03/18/2019 11:20  Glucose-Capillary Latest Ref Range: 70 - 99 mg/dL 891 (H) 694 (H) 503 (H) 161 (H) 168 (H)   Diabetes history: DM 2 Outpatient Diabetes medications:  Metformin 1000 mg bid Current orders for Inpatient glycemic control:  Novolog 0-6 units q 4 hours Inpatient Diabetes Program Recommendations:    A1C significantly increased.  Note that blood sugars have been mostly within hospital goals. Will discuss A1C with patient and encourage close monitoring and f/u regarding DM.   Thanks,  Beryl Meager, RN, BC-ADM Inpatient Diabetes Coordinator Pager [phone] ([phone]- Spoke with patient at bedside regarding elevated A1C.  She states that this is due to her drinking regular sodas and not watching her diet.  Explained that A1C is very high and increases risk for complications.  She states that she does not have a meter.  Discussed blood sugar goals of 100-130 mg/dL and asked patient to monitor daily.  She states that she is only willing to check her blood sugars once a week because she does not like sticking herself.  Note patients refusal for rehab/SNF, however I agree that this would be helpful for patient due to her weakness, etc.  If she does go home, would benefit from Home health RN to assist with DM management/education as well?

## 2019-03-18 NOTE — NC FL2 (Signed)
[place] LEVEL OF CARE SCREENING TOOL     IDENTIFICATION  Patient Name: Emma [name] Birthdate: 21-Nov-1945 Sex: female Admission Date (Current Location): 03/13/2019  [facility] and [place] Number:  Herbalist and Address:  [facility] [place], [place] [address], [place], [place] [address]      Provider Number: [id number]  Attending Physician Name and Address:  Oda Kilts, MD  Relative Name and Phone Number:       Current Level of Care: Hospital Recommended Level of Care: [place] Prior Approval Number:    Date Approved/Denied:   PASRR Number: [id number]  Discharge Plan: SNF    Current Diagnoses: Patient Active Problem List   Diagnosis Date Noted  . DVT (deep venous thrombosis) ([place]) 03/14/2019  . TIA (transient ischemic attack) 03/13/2019  . Stroke (cerebrum) ([place]) 03/13/2019  . Acute ischemic stroke ([place])   . PAD (peripheral artery disease) ([place]) 03/23/2018  . Back pain 09/09/2014  . HYPERLIPIDEMIA 12/18/2006  . RECTAL POLYPS 12/18/2006  . 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 SBO 08/01/2006  . GOITER NOS 03/11/2006  . PERIPHERAL NEUROPATHY 03/11/2006  . HYPERTENSION 03/11/2006  . OSTEOPENIA 03/11/2006  . FOOT DROP 03/11/2006    Orientation RESPIRATION BLADDER Height & Weight     Self, Time, Situation, Place  Normal Incontinent Weight: 137 lb 2 oz (62.2 kg) Height:  5\' 8"  (172.7 cm)  BEHAVIORAL SYMPTOMS/MOOD NEUROLOGICAL BOWEL NUTRITION STATUS      Continent Diet(see DC summary)  AMBULATORY STATUS COMMUNICATION OF NEEDS Skin   Extensive Assist Verbally Normal                       Personal Care Assistance Level of Assistance  Bathing, Feeding, Dressing Bathing Assistance: Maximum assistance Feeding assistance: Limited assistance Dressing Assistance: Maximum assistance     Functional Limitations Info  Speech     Speech Info: Impaired    SPECIAL CARE FACTORS FREQUENCY  PT (By licensed PT), OT  (By licensed OT), Speech therapy     PT Frequency: 5x/wk OT Frequency: 5x/wk     Speech Therapy Frequency: 5x/wk      Contractures Contractures Info: Not present    Additional Factors Info  Code Status, Allergies Code Status Info: DNR Allergies Info: NKA           Current Medications (03/18/2019):  This is the current hospital active medication list Current Facility-Administered Medications  Medication Dose Route Frequency Provider Last Rate Last Admin  . acetaminophen (TYLENOL) tablet 650 mg  650 mg Oral Q6H PRN Al Decant, MD   650 mg at 03/16/19 2039  . apixaban (ELIQUIS) tablet 5 mg  5 mg Oral BID Rosalin Hawking, MD   5 mg at 03/18/19 0909  . atorvastatin (LIPITOR) tablet 40 mg  40 mg Oral q1800 Seawell, Jaimie A, DO   40 mg at 03/17/19 1806  . benzonatate (TESSALON) capsule 100 mg  100 mg Oral TID PRN Jose Persia, MD   100 mg at 03/18/19 1019  . diclofenac Sodium (VOLTAREN) 1 % topical gel 4 g  4 g Topical QID PRN Seawell, Jaimie A, DO   4 g at 03/14/19 0628  . folic acid-pyridoxine-cyancobalamin (FOLTX) 2.5-25-2 MG per tablet 1 tablet  1 tablet Oral QODAY Seawell, Jaimie A, DO   1 tablet at 03/18/19 0909  . gabapentin (NEURONTIN) capsule 600 mg  600 mg Oral TID Maudie Mercury, MD   600 mg at 03/18/19 0909  .  insulin aspart (novoLOG) injection 0-6 Units  0-6 Units Subcutaneous Q4H Seawell, Jaimie A, DO   1 Units at 03/18/19 1130  . levothyroxine (SYNTHROID) tablet 100 mcg  100 mcg Oral Q0600 Yvette Rack, MD   100 mcg at 03/18/19 0531  . multivitamin with minerals tablet 1 tablet  1 tablet Oral Daily Anne Shutter, MD   1 tablet at 03/18/19 [phone]  . ondansetron (ZOFRAN) injection 4 mg  4 mg Intravenous Q8H PRN Kirt Boys, MD      . polyethylene glycol (MIRALAX / GLYCOLAX) packet 17 g  17 g Oral Daily Yvette Rack, MD   17 g at 03/18/19 0909  . tetrahydrozoline 0.05 % ophthalmic solution 1 drop  1 drop Both Eyes Daily PRN Kirt Boys, MD          Discharge Medications: Please see discharge summary for a list of discharge medications.  Relevant Imaging Results:  Relevant Lab Results:   Additional Information SS#: [id number]  Baldemar Lenis, LCSW

## 2019-03-19 ENCOUNTER — Other Ambulatory Visit (HOSPITAL_COMMUNITY): Payer: Medicare HMO

## 2019-03-19 ENCOUNTER — Inpatient Hospital Stay (HOSPITAL_COMMUNITY): Payer: Medicare HMO

## 2019-03-19 LAB — CBC WITH DIFFERENTIAL/PLATELET
Abs Immature Granulocytes: 0.02 10*3/uL (ref 0.00–0.07)
Basophils Absolute: 0 10*3/uL (ref 0.0–0.1)
Basophils Relative: 1 %
Eosinophils Absolute: 0.2 10*3/uL (ref 0.0–0.5)
Eosinophils Relative: 4 %
HCT: 33.8 % — ABNORMAL LOW (ref 36.0–46.0)
Hemoglobin: 10.5 g/dL — ABNORMAL LOW (ref 12.0–15.0)
Immature Granulocytes: 0 %
Lymphocytes Relative: 25 %
Lymphs Abs: 1.3 10*3/uL (ref 0.7–4.0)
MCH: 30.3 pg (ref 26.0–34.0)
MCHC: 31.1 g/dL (ref 30.0–36.0)
MCV: 97.7 fL (ref 80.0–100.0)
Monocytes Absolute: 0.5 10*3/uL (ref 0.1–1.0)
Monocytes Relative: 10 %
Neutro Abs: 3.1 10*3/uL (ref 1.7–7.7)
Neutrophils Relative %: 60 %
Platelets: 443 10*3/uL — ABNORMAL HIGH (ref 150–400)
RBC: 3.46 MIL/uL — ABNORMAL LOW (ref 3.87–5.11)
RDW: 13.9 % (ref 11.5–15.5)
WBC: 5.3 10*3/uL (ref 4.0–10.5)
nRBC: 0 % (ref 0.0–0.2)

## 2019-03-19 LAB — GLUCOSE, CAPILLARY
Glucose-Capillary: 111 mg/dL — ABNORMAL HIGH (ref 70–99)
Glucose-Capillary: 115 mg/dL — ABNORMAL HIGH (ref 70–99)
Glucose-Capillary: 133 mg/dL — ABNORMAL HIGH (ref 70–99)
Glucose-Capillary: 136 mg/dL — ABNORMAL HIGH (ref 70–99)
Glucose-Capillary: 168 mg/dL — ABNORMAL HIGH (ref 70–99)
Glucose-Capillary: 206 mg/dL — ABNORMAL HIGH (ref 70–99)
Glucose-Capillary: 208 mg/dL — ABNORMAL HIGH (ref 70–99)
Glucose-Capillary: 99 mg/dL (ref 70–99)

## 2019-03-19 LAB — BASIC METABOLIC PANEL
Anion gap: 16 — ABNORMAL HIGH (ref 5–15)
BUN: 25 mg/dL — ABNORMAL HIGH (ref 8–23)
CO2: 24 mmol/L (ref 22–32)
Calcium: 9.3 mg/dL (ref 8.9–10.3)
Chloride: 102 mmol/L (ref 98–111)
Creatinine, Ser: 1 mg/dL (ref 0.44–1.00)
GFR calc Af Amer: >60 mL/min (ref >60)
GFR calc non Af Amer: 57 mL/min — ABNORMAL LOW (ref >60)
Glucose, Bld: 129 mg/dL — ABNORMAL HIGH (ref 70–99)
Potassium: 4.1 mmol/L (ref 3.5–5.1)
Sodium: 142 mmol/L (ref 135–145)

## 2019-03-19 LAB — SARS CORONAVIRUS 2 (TAT 6-24 HRS): SARS Coronavirus 2: NEGATIVE

## 2019-03-19 MED ORDER — SENNA 8.6 MG PO TABS
1.0000 | ORAL_TABLET | Freq: Every day | ORAL | Status: DC
  Administered 2019-03-19 – 2019-03-20 (×2): 8.6 mg via ORAL
  Filled 2019-03-19 (×2): qty 1

## 2019-03-19 MED ORDER — POLYETHYLENE GLYCOL 3350 17 G PO PACK
17.0000 g | PACK | Freq: Two times a day (BID) | ORAL | Status: DC
  Administered 2019-03-19 – 2019-03-20 (×2): 17 g via ORAL
  Filled 2019-03-19 (×2): qty 1

## 2019-03-19 MED ORDER — SODIUM CHLORIDE 0.9 % IV BOLUS
1000.0000 mL | Freq: Once | INTRAVENOUS | Status: AC
  Administered 2019-03-19: 1000 mL via INTRAVENOUS

## 2019-03-19 NOTE — Progress Notes (Signed)
Internal Medicine Attending Note:  I have seen and evaluated this patient and I have discussed the plan of care with the house staff. Please see their note for complete details. I concur with their findings.   Reymundo Poll, MD 03/19/2019, 12:55 PM

## 2019-03-19 NOTE — Significant Event (Addendum)
Rapid Response Event Note  Overview: Neurologic - RUE Flaccid  Initial Focused Assessment: Called by nursing staff with concerns of patient's RUE being flaccid, OTs were with the patient and they noticed the RUE was flaccid, previously she had 3/5 strength and now has 0/5 strength. Per nurse, no new neuro changes - per nurse, patient's RUE has been flaccid she this morning. Mild RT facial droop, + dysarthria - mild aphasia are all on going. RLE - AKA. Blood sugar was normal. Unsure of when the weakness got worse > ?  Interventions: -- ST CT HEAD  Plan of Care: -- I was called away to a medical emergency while we were bringing the patient back to the room.  -- RN to update and received furthers orders/plan of care from MD.  Event Summary:  Call Time 1403 Arrival Time 1404 End Time 1437   Emma [name] R

## 2019-03-19 NOTE — Progress Notes (Signed)
Occupational Therapy Treatment Patient Details Name: Emma [name] MRN: [id number] DOB: 1945-02-18 Today's Date: 03/19/2019    History of present illness The pt is a 74 yo female presenting due to onset of R-sided weakness and slurred speech. CT revelaed acute L MCA stroke and small infarcts in L frontal lobe. Found with Flaccid UE 03/19/19; stat CT reveals "Evolving recent infarction within the left frontal lobe likely with mild progression in size compared to the recent MRI". PMH includes: hypertension, hyperlipidemia, diabetes, and RLE AKA.   OT comments  Pt supine in bed and agreeable to OT session, but limited by pain in R LE.  Assisted with grooming bed level with setup assist (to brush teeth). Noted R UE flaccid, educated on positioning and provided PROM.  Noted flaccid UE different from previous OT evaluation, therefore notified RN and rapid sent to stat CT.  DC plan remains appropriate at SNF level.    Follow Up Recommendations  SNF;Supervision/Assistance - 24 hour    Equipment Recommendations  Other (comment)(TBD at next venue of care )    Recommendations for Other Services      Precautions / Restrictions Precautions Precautions: Fall Precaution Comments: pt with previous R AKA, does not use prosthesis Restrictions Weight Bearing Restrictions: No       Mobility Bed Mobility Overal bed mobility: Needs Assistance Bed Mobility: Supine to Sit;Sit to Supine     Supine to sit: Mod assist Sit to supine: Mod assist   General bed mobility comments: deferred d/t R LE pain   Transfers Overall transfer level: (deferred OOB this session.)                    Balance Overall balance assessment: Needs assistance Sitting-balance support: No upper extremity supported;Feet supported Sitting balance-Leahy Scale: Poor Sitting balance - Comments: with dynamic movements,                       High Level Balance Comments: Performed reaching with clapsed hand  outside BOS into multiple planes.  2x10 reps.  Perfromed Hand over hand table washing with RUE supported for ROM.  Pt tolerated edge of bed x 15 min with min to mod assistance.           ADL either performed or assessed with clinical judgement   ADL Overall ADL's : Needs assistance/impaired     Grooming: Set up;Sitting;Oral care Grooming Details (indicate cue type and reason): brushing teeth with setup assist, HOB elevated                   Toilet Transfer Details (indicate cue type and reason): deferred            General ADL Comments: pt declined mobility due to R LE pain, noted NEW FLACCID R UE from previous session and RN notified      Vision       Perception     Praxis      Cognition Arousal/Alertness: Awake/alert Behavior During Therapy: Flat affect Overall Cognitive Status: No family/caregiver present to determine baseline cognitive functioning                                 General Comments: follows commands and engages appropriately         Exercises Exercises: Other exercises General Exercises - Lower Extremity Ankle Circles/Pumps: AROM;Supine;20 reps;Left Quad Sets: AROM;Left;10 reps;Supine Heel Slides: AROM;Left;10 reps;Supine Hip ABduction/ADduction:  AROM;Both;10 reps;Supine Straight Leg Raises: AROM;10 reps;Supine;Left Hip Flexion/Marching: AROM;10 reps;Right;Supine Other Exercises Other Exercises: PROM for R UE from shoulder to hand provided    Shoulder Instructions       General Comments education on positioning of R UE to increase comfort and prevent subluxation     Pertinent Vitals/ Pain       Pain Assessment: Faces Faces Pain Scale: Hurts little more Pain Location: R LE  Pain Descriptors / Indicators: Discomfort Pain Intervention(s): Monitored during session;Repositioned  Home Living                                          Prior Functioning/Environment              Frequency  Min  2X/week        Progress Toward Goals  OT Goals(current goals can now be found in the care plan section)  Progress towards OT goals: Not progressing toward goals - comment  Acute Rehab OT Goals Patient Stated Goal: to be able to use my arm  OT Goal Formulation: With patient  Plan Discharge plan remains appropriate;Frequency remains appropriate    Co-evaluation                 AM-PAC OT "6 Clicks" Daily Activity     Outcome Measure   Help from another person eating meals?: A Little Help from another person taking care of personal grooming?: A Little Help from another person toileting, which includes using toliet, bedpan, or urinal?: A Lot Help from another person bathing (including washing, rinsing, drying)?: A Lot Help from another person to put on and taking off regular upper body clothing?: A Lot Help from another person to put on and taking off regular lower body clothing?: Total 6 Click Score: 13    End of Session    OT Visit Diagnosis: Unsteadiness on feet (R26.81);Other abnormalities of gait and mobility (R26.89);Muscle weakness (generalized) (M62.81)   Activity Tolerance Patient tolerated treatment well   Patient Left in bed;with call bell/phone within reach;with bed alarm set   Nurse Communication Mobility status;Other (comment)(flaccid RUE and change from previous session)        Time: 1326-1350 OT Time Calculation (min): 24 min  Charges: OT General Charges $OT Visit: 1 Visit OT Treatments $Self Care/Home Management : 8-22 mins $Neuromuscular Re-education: 8-22 mins  Jolaine Artist, OT Acute Rehabilitation Services Pager [phone] Office [place] 03/19/2019, 3:02 PM

## 2019-03-19 NOTE — Evaluation (Signed)
Clinical/Bedside Swallow Evaluation Patient Details  Name: Emma [name] MRN: [id number] Date of Birth: [birth year]  Today's Date: 03/19/2019 Time: SLP Start Time (ACUTE ONLY): 2 SLP Stop Time (ACUTE ONLY): 1722 SLP Time Calculation (min) (ACUTE ONLY): 14 min  Past Medical History:  Past Medical History:  Diagnosis Date  . Arthritis    Back and right shoulder  . Constipation   . Diabetes mellitus    Type II  . Diabetes mellitus without complication ([place])   . Diabetic neuropathy ([place])   . History of blood transfusion   . Hypercholesteremia   . Hypertension   . Hypothyroidism   . Pneumonia   . Thyroid disease    Past Surgical History:  Past Surgical History:  Procedure Laterality Date  . ABDOMINAL AORTOGRAM N/A 03/25/2018   Procedure: ABDOMINAL AORTOGRAM;  Surgeon: Waynetta Sandy, MD;  Location: [place] [facility];  Service: Cardiovascular;  Laterality: N/A;  . ABDOMINAL HYSTERECTOMY    . AMPUTATION Right 03/29/2018   Procedure: AMPUTATION ABOVE KNEE;  Surgeon: Serafina Mitchell, MD;  Location: [place];  Service: Vascular;  Laterality: Right;  . APPENDECTOMY    . BACK SURGERY    . BYPASS GRAFT FEMORAL-PERONEAL Right 03/26/2018   Procedure: BYPASS GRAFT [place];  Surgeon: Waynetta Sandy, MD;  Location: [place];  Service: Vascular;  Laterality: Right;  . EXPLORATORY LAPAROTOMY     Adhesions removed and appendix  . FEMORAL-POPLITEAL BYPASS GRAFT Right 03/27/2018   Procedure: Right lower extremity angiogram, Right Femoral Peroneal artery bypass, Angioplasty Right Femoral Peroneal bypass, Thrombectomy of Femoral peroneal artery;  Surgeon: Waynetta Sandy, MD;  Location: [place];  Service: Vascular;  Laterality: Right;  . INTRAOPERATIVE ARTERIOGRAM Right 03/26/2018   Procedure: Intra Operative Arteriogram;  Surgeon: Waynetta Sandy, MD;  Location: [place];  Service: Vascular;  Laterality: Right;  . LOWER EXTREMITY ANGIOGRAPHY Bilateral  03/25/2018   Procedure: LOWER EXTREMITY ANGIOGRAPHY;  Surgeon: Waynetta Sandy, MD;  Location: [place] [facility];  Service: Cardiovascular;  Laterality: Bilateral;  . LUMBAR LAMINECTOMY/DECOMPRESSION MICRODISCECTOMY N/A 09/09/2014   Procedure: L4-L5 DECOMPRESSION ;  Surgeon: Melina Schools, MD;  Location: [place];  Service: Orthopedics;  Laterality: N/A;  . PATCH ANGIOPLASTY Right 03/26/2018   Procedure: PATCH ANGIOPLASTY;  Surgeon: Waynetta Sandy, MD;  Location: [place];  Service: Vascular;  Laterality: Right;  . SHOULDER SURGERY    . THYROID SURGERY    . TONSILLECTOMY    . TOTAL THYROIDECTOMY    . VEIN HARVEST Right 03/26/2018   Procedure: Vein Harvest of Right Leg Greater Saphenous Vein in SITU;  Surgeon: Waynetta Sandy, MD;  Location: [facility];  Service: Vascular;  Laterality: Right;   HPI:  Pt is a 74 year old female with a past medical history significant for PVD (s/p R AKA), HTN, HLD, hypothyroidism, T2DM, who presented with right-sided weakness and slurred speech upon awakening from her sleep. MRI of the brain: Small acute infarcts within the left frontal lobe. Pt failed [facility] swallow screen due to coughing.  Pt seen and d/c'd for swallow 3/1 on regular diet. Pt made NPO 03/19/19 after repeat CT scan done/concern for a change in condition.   Assessment / Plan / Recommendation Clinical Impression   Patient seen for repeat bedside swallow evaluation. Pt discharged from ST swallow service on regular solids/thin liquids on 03/17/19. Today, patient underwent stat CT after being found with flaccid UE. CT report: "Evolving recent infarction within the left frontal lobe likely with mild progression in size  compared to the recent MRI".  Patient NPO pending this BSE.   Patient seen at bedside, HOB raised. Patient with missing dentition, mild overall oral weakness and mild left facial droop. Patient refused ice chip trials, "I don't like the cold," but accepted spoon, cup and straw  sips of thin liquid water. Pt with one instance of immediate cough following somewhat large straw sip. Otherwise, no overt s/sx aspiration seen with thin liquids despite thorough challenging. Patient seen with puree solids: mildly delayed AP transit, but no overt s/sx aspiration. With regular solids (graham cracker): patient with mildly prolonged mastication, swallow initiation appeared timely, min residue after the swallow. RN student entered, provided patient with pills and thin liquids (water) via straw. Patient swallowing one pill at at time, followed by straw sips of water. No overt s/sx aspiration.  Recommend initiation of dysphagia 3 solids/thin liquids. Intermittent supervision. ST to follow for diet tolerance.      SLP Visit Diagnosis: Dysphagia, unspecified (R13.10)    Aspiration Risk  Mild aspiration risk    Diet Recommendation Dysphagia 3 (Mech soft);Thin liquid   Medication Administration: Whole meds with liquid(one pill at a time) Supervision: Patient able to self feed;Intermittent supervision to cue for compensatory strategies Compensations: Minimize environmental distractions;Slow rate;Small sips/bites;Lingual sweep for clearance of pocketing Postural Changes: Seated upright at 90 degrees    Other  Recommendations Oral Care Recommendations: Oral care BID   Follow up Recommendations 24 hour supervision/assistance      Frequency and Duration min 2x/week  1 week       Prognosis Prognosis for Safe Diet Advancement: Good      Swallow Study   General HPI: Pt is a 74 year old female with a past medical history significant for PVD (s/p R AKA), HTN, HLD, hypothyroidism, T2DM, who presented with right-sided weakness and slurred speech upon awakening from her sleep. MRI of the brain: Small acute infarcts within the left frontal lobe. Pt failed [facility] swallow screen due to coughing.  Type of Study: Bedside Swallow Evaluation Diet Prior to this Study: NPO Temperature Spikes  Noted: No Respiratory Status: Room air History of Recent Intubation: No Behavior/Cognition: Alert Oral Cavity Assessment: Within Functional Limits Oral Cavity - Dentition: Edentulous Vision: Functional for self-feeding Self-Feeding Abilities: Needs assist;Needs set up Patient Positioning: Upright in bed Baseline Vocal Quality: Low vocal intensity Volitional Cough: Strong Volitional Swallow: Able to elicit    Oral/Motor/Sensory Function Overall Oral Motor/Sensory Function: Mild impairment Facial Symmetry: Abnormal symmetry right Facial Sensation: Within Functional Limits   Ice Chips Ice chips: Not tested(pt refused)   Thin Liquid Thin Liquid: Within functional limits Presentation: Spoon;Straw;Cup;Self Fed    Nectar Thick Nectar Thick Liquid: Not tested   Honey Thick     Puree Puree: Within functional limits   Solid     Solid: Impaired Presentation: Self Fed Oral Phase Impairments: Impaired mastication Oral Phase Functional Implications: Impaired mastication Pharyngeal Phase Impairments: Cough - Immediate      Griffith Santilli 03/19/2019,5:27 PM  Shella Spearing, M.Ed., CCC-SLP Speech Therapy Acute Rehabilitation [phone]: Acute Rehab office [phone] - pager

## 2019-03-19 NOTE — TOC Progression Note (Signed)
Transition of Care [facility]) - Progression Note    Patient Details  Name: Emma [name] MRN: [id number] Date of Birth: 12-Jul-1945  Transition of [facility] CM/SW [place], [place] Phone Number: 03/19/2019, 3:45 PM  Clinical Narrative:   CSW met with patient to provide bed offers. Patient chose [facility], and asked CSW to update patient's son, Randall Hiss. CSW contacted [facility] to request that they initiate insurance authorization through [employer]. CSW contacted Randall Hiss to update him on patient's choice and that we are now awaiting insurance approval. Randall Hiss appreciative of update. CSW to follow.    Expected Discharge Plan: [place] Barriers to Discharge: Continued Medical Work up, Ship broker  Expected Discharge Plan and Services Expected Discharge Plan: [place] Choice: [place] arrangements for the past 2 months: Single Family Home Expected Discharge Date: 03/15/19                                     Social Determinants of Health (SDOH) Interventions    Readmission Risk Interventions No flowsheet data found.

## 2019-03-19 NOTE — Progress Notes (Addendum)
Physical Therapy Treatment Patient Details Name: Emma [name] MRN: [id number] DOB: 1945/08/16 Today's Date: 03/19/2019    History of Present Illness The pt is a 74 yo female presenting due to onset of R-sided weakness and slurred speech. CT revelaed acute L MCA stroke and small infarcts in L frontal lobe. PMH includes: hypertension, hyperlipidemia, diabetes, and RLE AKA.    PT Comments    Pt performed functional mobility at bed level.  She required assistance to faciliate use of RUE as it is now flaccid compared to treatment on Monday.  Performed weight bearing in R hand and elbow while sitting edge of bed.  Pt continues to benefit from skilled rehab at SNF to improve strength and function before returning home.     Follow Up Recommendations  SNF;Supervision/Assistance - 24 hour     Equipment Recommendations  (Pt has WC defer to post acute.)    Recommendations for Other Services       Precautions / Restrictions Precautions Precautions: Fall Precaution Comments: pt with previous R AKA, does not use prosthesis Restrictions Weight Bearing Restrictions: No    Mobility  Bed Mobility Overal bed mobility: Needs Assistance Bed Mobility: Supine to Sit;Sit to Supine     Supine to sit: Mod assist Sit to supine: Mod assist   General bed mobility comments: Pt required heavy use of rail to move from supine to sit.  To return to bed she required mod +1 and bed placed in trend down to scoot to [facility].  Transfers Overall transfer level: (deferred OOB this session.)                  Ambulation/Gait                 Stairs             Wheelchair Mobility    Modified Rankin (Stroke Patients Only)       Balance Overall balance assessment: Needs assistance Sitting-balance support: No upper extremity supported;Feet supported Sitting balance-Leahy Scale: Poor Sitting balance - Comments: with dynamic movements,                       High Level  Balance Comments: Performed reaching with clapsed hand outside BOS into multiple planes.  2x10 reps.  Perfromed Hand over hand table washing with RUE supported for ROM.  Pt tolerated edge of bed x 15 min with min to mod assistance.            Cognition Arousal/Alertness: Awake/alert Behavior During Therapy: Flat affect Overall Cognitive Status: No family/caregiver present to determine baseline cognitive functioning                                 General Comments: Followed all commands and oriented      Exercises General Exercises - Lower Extremity Ankle Circles/Pumps: AROM;Supine;20 reps;Left Quad Sets: AROM;Left;10 reps;Supine Heel Slides: AROM;Left;10 reps;Supine Hip ABduction/ADduction: AROM;Both;10 reps;Supine Straight Leg Raises: AROM;10 reps;Supine;Left Hip Flexion/Marching: AROM;10 reps;Right;Supine    General Comments        Pertinent Vitals/Pain Pain Assessment: No/denies pain Faces Pain Scale: Hurts a little bit Pain Location: generalized Pain Descriptors / Indicators: Discomfort Pain Intervention(s): Monitored during session;Repositioned    Home Living                      Prior Function  PT Goals (current goals can now be found in the care plan section) Acute Rehab PT Goals Patient Stated Goal: to get back to work Potential to Achieve Goals: Good Progress towards PT goals: Progressing toward goals    Frequency    Min 3X/week      PT Plan Current plan remains appropriate    Co-evaluation              AM-PAC PT "6 Clicks" Mobility   Outcome Measure  Help needed turning from your back to your side while in a flat bed without using bedrails?: A Little Help needed moving from lying on your back to sitting on the side of a flat bed without using bedrails?: A Lot Help needed moving to and from a bed to a chair (including a wheelchair)?: A Lot Help needed standing up from a chair using your arms (e.g.,  wheelchair or bedside chair)?: A Lot Help needed to walk in hospital room?: Total Help needed climbing 3-5 steps with a railing? : Total 6 Click Score: 11    End of Session Equipment Utilized During Treatment: Gait belt Activity Tolerance: Patient tolerated treatment well Patient left: in bed;with call bell/phone within reach Nurse Communication: Mobility status PT Visit Diagnosis: Difficulty in walking, not elsewhere classified (R26.2);Repeated falls (R29.6);Muscle weakness (generalized) (M62.81);Hemiplegia and hemiparesis Hemiplegia - Right/Left: Right Hemiplegia - dominant/non-dominant: Non-dominant Hemiplegia - caused by: Cerebral infarction     Time: 4097-3532 PT Time Calculation (min) (ACUTE ONLY): 30 min  Charges:  $Therapeutic Activity: 23-37 mins                     Emma [name] , PTA Acute Rehabilitation Services Pager [phone] Office [phone]     Emma [name] 03/19/2019, 12:52 PM

## 2019-03-19 NOTE — Progress Notes (Signed)
   Subjective:   Emma [name] states she is doing okay this morning.  She is still upset about the decision to go to rehab but confirms that she is still willing to give it a try since her son convinced her.  Emma [name] denies any improvement in right-sided weakness.  Objective:  Vital signs in last 24 hours: Vitals:   03/18/19 2010 03/18/19 2353 03/19/19 0324 03/19/19 0409  BP: 102/65 106/62 109/68   Pulse: 90 90 94   Resp: 16 16 16    Temp: 98.2 F (36.8 C) 98.5 F (36.9 C) 98.6 F (37 C)   TempSrc: Oral     SpO2: 97% 93% 95%   Weight:    59.9 kg  Height:        Physical Exam Vitals and nursing note reviewed.  Constitutional:      General: She is not in acute distress.    Appearance: She is normal weight.  Cardiovascular:     Rate and Rhythm: Normal rate and regular rhythm.     Heart sounds: No murmur.  Pulmonary:     Effort: Pulmonary effort is normal. No respiratory distress.     Breath sounds: No wheezing or rales.  Abdominal:     General: Bowel sounds are normal.     Palpations: Abdomen is soft.  Skin:    General: Skin is warm and dry.  Neurological:     Mental Status: She is alert and oriented to person, place, and time.     Cranial Nerves: Dysarthria present.     Motor: Weakness (0/5 right upper extremity strength.  5/5 left upper extremity) present.  Psychiatric:        Mood and Affect: Mood normal.        Behavior: Behavior normal.    Assessment/Plan:  Principal Problem:   Stroke (cerebrum) (HCC) Active Problems:   TIA (transient ischemic attack)   DVT (deep venous thrombosis) (HCC)  Emma [name] is a 74 y.o f with diabetes mellitus 2, hypothyroidism who presented with right sided weakness, dysphagia, dysarthria that showed small acute infarcts in left frontal lobbe.  # Left MCA infarcts  Imaging findings consistent with embolic source.  Differential includes paroxysmal atrial fibrillation versus PFO. Unable to assess for PFO on TTE due to  inadequate acoustic windows.  Neurology has recommended outpatient work-up given that patient is adamant about just leaving the hospital as quickly as possible.  - Elliquis 5 mg BID - Lipitor 40 mg  - SNF placement pending  # T2DM # Diabetic Neuropathy  A1c elevated at 11.1 on admission but BG have been on the lower end here. This may be due to dysphagia/dysarthria from acute stroke limiting po intake. Home med is only Metformin. She may benefit from another agent on discharge.   - SSI (moderate) - Gabapentin 600 mg TID  #Dry cough - Tessalon pearls 100 mg TID as needed  # LLE DVT - multiple - Vascular surgery as evaluated; no surgical intervention indicated at this time.  - Elliquis 5 mg BID, lifetime    Dispo: Anticipated discharge pending SNF placement.   Dr. 62 Internal Medicine PGY-1  Pager: [phone] 03/19/2019, 6:34 AM

## 2019-03-19 NOTE — Progress Notes (Signed)
STROKE TEAM PROGRESS NOTE   INTERVAL HISTORY Patient is sitting up in bed.  She is unhappy that she cannot go home and will have to go to rehab.  She has no new complaints.  Vitals:   03/19/19 0409 03/19/19 0801 03/19/19 1015 03/19/19 1252  BP:   104/65 111/71  Pulse:  95 96 89  Resp:   16 16  Temp:  99.4 F (37.4 C) 98.7 F (37.1 C) 97.6 F (36.4 C)  TempSrc:  Oral Oral Oral  SpO2:  91% 96% 93%  Weight: 59.9 kg     Height:        CBC:  Recent Labs  Lab 03/18/19 0830 03/19/19 0722  WBC 6.7 5.3  NEUTROABS 4.8 3.1  HGB 11.1* 10.5*  HCT 34.4* 33.8*  MCV 95.3 97.7  PLT 390 443*    Basic Metabolic Panel:  Recent Labs  Lab 03/14/19 0341 03/14/19 0341 03/18/19 0830 03/19/19 0722  NA 142   < > 138 142  K 4.3   < > 4.5 4.1  CL 107   < > 100 102  CO2 27   < > 22 24  GLUCOSE 120*   < > 151* 129*  BUN 12   < > 23 25*  CREATININE 0.97   < > 1.02* 1.00  CALCIUM 8.8*   < > 9.1 9.3  MG 1.6*  --  1.9  --    < > = values in this interval not displayed.   Lipid Panel:     Component Value Date/Time   CHOL 156 03/14/2019 0341   TRIG 139 03/14/2019 0341   HDL 36 (L) 03/14/2019 0341   CHOLHDL 4.3 03/14/2019 0341   VLDL 28 03/14/2019 0341   LDLCALC 92 03/14/2019 0341   HgbA1c:  Lab Results  Component Value Date   HGBA1C 11.1 (H) 03/14/2019   Urine Drug Screen:     Component Value Date/Time   LABOPIA NONE DETECTED 03/14/2019 0516   COCAINSCRNUR NONE DETECTED 03/14/2019 0516   LABBENZ NONE DETECTED 03/14/2019 0516   AMPHETMU NONE DETECTED 03/14/2019 0516   THCU NONE DETECTED 03/14/2019 0516   LABBARB NONE DETECTED 03/14/2019 0516    IMAGING past 24 hours No results found.  PHYSICAL EXAM  General - Well nourished, well developed, in no apparent distress.  Ophthalmologic - fundi not visualized due to noncooperation.  Cardiovascular - Regular rhythm and rate.  Mental Status -  Level of arousal and orientation to time, place, and person were  intact. Language including expression, naming, repetition, comprehension was assessed and found intact.  Mild to moderate dysarthria as well as mild expressive aphasia with some word finding difficulties and disfluency.  No paraphasic errors.  Good comprehension. Fund of Knowledge was assessed and was intact.  Cranial Nerves II - XII - II - Visual field intact OU. III, IV, VI - Extraocular movements intact. V - Facial sensation intact bilaterally. VII - mild right facial droop. VIII - Hearing & vestibular intact bilaterally. X - Palate elevates symmetrically. XI - Chin turning & shoulder shrug intact bilaterally. XII - Tongue protrusion intact.  Motor Strength - The patient's strength was normal in all extremities except right pronator drift and right finger grip 4/5.  Right lower extremity AKA but strength 5/5 proximally.  Bulk was normal and fasciculations were absent.    Diminished fine finger movements on the right and orbits left over right upper extremity. Motor Tone - Muscle tone was assessed at the neck and appendages  and was normal.  Reflexes - The patient's reflexes were symmetrical in all extremities and she had no pathological reflexes.  Sensory - Light touch, temperature/pinprick were assessed and were symmetrical.    Coordination - The patient had normal movements in the hands with no ataxia or dysmetria.  Tremor was absent.  Gait and Station - deferred.  ASSESSMENT/PLAN [name] is a 74 y.o. female with history of hypertension, hyperlipidemia, diabetes presenting with R sided weakness.   Stroke:   Small L frontal lobe infarcts, embolic pattern, could be due to extensive LE DVT with ? PFO or occult afib if no PFO  Code Stroke CT head No acute abnormality. Moderate Small vessel disease. Atrophy.   CTA head mid L M2 occlusion/near occlusion. Intracranial ICA atherosclerosis. B cavernous ICA moderate stenosis. L V4 mid atherosclerosis.   CTA neck B ICA  bifurcation w/ mixed plaque. R VA origin moderate/severe atherosclerosis. L VA origin mild/mod atherosclerosis. 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  CT perfusion no core infarct. 38mL L frontal lobe penumbra w/ TMax>6, w/ TMax>4 the 51mL penumbra  MRI  Small L frontal lobe infarcts. Moderate small vessel disease.   2D Echo EF 50-55%. No source of embolus   LE doppler left common femoral vein, SF junction, left femoral vein, left proximal profunda vein, left popliteal vein, left posterior tibial veins, left peroneal veins, and EIV  LDL 92  HgbA1c 11.1  Eliquis for VTE prophylaxis  Recommend TCD bubble study to rule out PFO.  Recommend lifelong anticoagulation given DVT with no activity, no further cardioembolic work-up needed.  aspirin 81 mg daily prior to admission, now on Eliquis 5 mg twice daily for DVT treatment as well as for stroke prevention.  Patient stroke is small, low risk of hemorrhagic conversion at this time.  Therapy recommendations:  SNF  Disposition:  pending   LLE DVT  LE doppler R AKA, L DVT common femoral, SF jxn, femoral, proximal profunda, popliteal, posterior tib, peroneal and EIV.  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  Do not recommend 10 mg twice daily initial dosing due to acute stroke.  Hypertension  Stable . BP goal now normotensive  Hyperlipidemia  Home meds:  lipitor 10  Now on lipitor 40  LDL 92, goal < 70  Continue statin at discharge  Diabetes type II Uncontrolled Diabetic Neuropathy  HgbA1c 11.1, goal < 7.0  Other Stroke Risk Factors  Advanced age  Cigarette smoker, advised to stop smoking  ETOH use, advised to drink no more than 1 drink(s) a day  Other Active Problems  Hypothyroidism  W/ soft tissue mass on imaging  Right AKA  Dry cough on  [facility] day # 6 She presented with embolic left MCA infarct etiology indeterminate but in the circumstances of acute DVT paradoxical embolism is a consideration.  Patient clearly needs to be on anticoagulation quite some time given recurrent DVTs.  Hence will not recommend loop recorder but will check transcranial Doppler bubble study at the bedside if she is willing to see if she has a PFO.  Greater than 50% time during this 25-minute visit was spent on counseling and coordination of care and discussion with care team.  Discussed with Dr.Basaraba Delia Heady, MD To contact Stroke Continuity provider, please refer to [email or web address]. After hours, contact General Neurology

## 2019-03-20 DIAGNOSIS — D649 Anemia, unspecified: Secondary | ICD-10-CM | POA: Diagnosis not present

## 2019-03-20 DIAGNOSIS — Z7984 Long term (current) use of oral hypoglycemic drugs: Secondary | ICD-10-CM | POA: Diagnosis not present

## 2019-03-20 DIAGNOSIS — I639 Cerebral infarction, unspecified: Secondary | ICD-10-CM | POA: Diagnosis not present

## 2019-03-20 DIAGNOSIS — E038 Other specified hypothyroidism: Secondary | ICD-10-CM | POA: Diagnosis not present

## 2019-03-20 DIAGNOSIS — I82492 Acute embolism and thrombosis of other specified deep vein of left lower extremity: Secondary | ICD-10-CM | POA: Diagnosis not present

## 2019-03-20 DIAGNOSIS — H04123 Dry eye syndrome of bilateral lacrimal glands: Secondary | ICD-10-CM | POA: Diagnosis not present

## 2019-03-20 DIAGNOSIS — N289 Disorder of kidney and ureter, unspecified: Secondary | ICD-10-CM | POA: Diagnosis not present

## 2019-03-20 DIAGNOSIS — R1310 Dysphagia, unspecified: Secondary | ICD-10-CM | POA: Diagnosis not present

## 2019-03-20 DIAGNOSIS — E118 Type 2 diabetes mellitus with unspecified complications: Secondary | ICD-10-CM | POA: Diagnosis not present

## 2019-03-20 DIAGNOSIS — E1140 Type 2 diabetes mellitus with diabetic neuropathy, unspecified: Secondary | ICD-10-CM | POA: Diagnosis not present

## 2019-03-20 DIAGNOSIS — R4781 Slurred speech: Secondary | ICD-10-CM | POA: Diagnosis not present

## 2019-03-20 DIAGNOSIS — E039 Hypothyroidism, unspecified: Secondary | ICD-10-CM | POA: Diagnosis not present

## 2019-03-20 DIAGNOSIS — M2550 Pain in unspecified joint: Secondary | ICD-10-CM | POA: Diagnosis not present

## 2019-03-20 DIAGNOSIS — I69351 Hemiplegia and hemiparesis following cerebral infarction affecting right dominant side: Secondary | ICD-10-CM | POA: Diagnosis not present

## 2019-03-20 DIAGNOSIS — Z7401 Bed confinement status: Secondary | ICD-10-CM | POA: Diagnosis not present

## 2019-03-20 DIAGNOSIS — I69320 Aphasia following cerebral infarction: Secondary | ICD-10-CM | POA: Diagnosis not present

## 2019-03-20 DIAGNOSIS — I63512 Cerebral infarction due to unspecified occlusion or stenosis of left middle cerebral artery: Secondary | ICD-10-CM | POA: Diagnosis not present

## 2019-03-20 DIAGNOSIS — R05 Cough: Secondary | ICD-10-CM | POA: Diagnosis not present

## 2019-03-20 DIAGNOSIS — R41841 Cognitive communication deficit: Secondary | ICD-10-CM | POA: Diagnosis not present

## 2019-03-20 DIAGNOSIS — Z03818 Encounter for observation for suspected exposure to other biological agents ruled out: Secondary | ICD-10-CM | POA: Diagnosis not present

## 2019-03-20 DIAGNOSIS — I69328 Other speech and language deficits following cerebral infarction: Secondary | ICD-10-CM | POA: Diagnosis not present

## 2019-03-20 DIAGNOSIS — I69828 Other speech and language deficits following other cerebrovascular disease: Secondary | ICD-10-CM | POA: Diagnosis not present

## 2019-03-20 DIAGNOSIS — G8190 Hemiplegia, unspecified affecting unspecified side: Secondary | ICD-10-CM | POA: Diagnosis not present

## 2019-03-20 DIAGNOSIS — N179 Acute kidney failure, unspecified: Secondary | ICD-10-CM | POA: Diagnosis not present

## 2019-03-20 DIAGNOSIS — E785 Hyperlipidemia, unspecified: Secondary | ICD-10-CM | POA: Diagnosis not present

## 2019-03-20 DIAGNOSIS — I69391 Dysphagia following cerebral infarction: Secondary | ICD-10-CM | POA: Diagnosis not present

## 2019-03-20 DIAGNOSIS — R1312 Dysphagia, oropharyngeal phase: Secondary | ICD-10-CM | POA: Diagnosis not present

## 2019-03-20 DIAGNOSIS — G459 Transient cerebral ischemic attack, unspecified: Secondary | ICD-10-CM | POA: Diagnosis not present

## 2019-03-20 DIAGNOSIS — E1142 Type 2 diabetes mellitus with diabetic polyneuropathy: Secondary | ICD-10-CM | POA: Diagnosis not present

## 2019-03-20 DIAGNOSIS — I10 Essential (primary) hypertension: Secondary | ICD-10-CM | POA: Diagnosis not present

## 2019-03-20 DIAGNOSIS — J069 Acute upper respiratory infection, unspecified: Secondary | ICD-10-CM | POA: Diagnosis not present

## 2019-03-20 DIAGNOSIS — R4701 Aphasia: Secondary | ICD-10-CM | POA: Diagnosis not present

## 2019-03-20 DIAGNOSIS — E1169 Type 2 diabetes mellitus with other specified complication: Secondary | ICD-10-CM | POA: Diagnosis not present

## 2019-03-20 DIAGNOSIS — G8191 Hemiplegia, unspecified affecting right dominant side: Secondary | ICD-10-CM | POA: Diagnosis not present

## 2019-03-20 DIAGNOSIS — R471 Dysarthria and anarthria: Secondary | ICD-10-CM | POA: Diagnosis not present

## 2019-03-20 DIAGNOSIS — Z7901 Long term (current) use of anticoagulants: Secondary | ICD-10-CM

## 2019-03-20 DIAGNOSIS — I82402 Acute embolism and thrombosis of unspecified deep veins of left lower extremity: Secondary | ICD-10-CM | POA: Diagnosis not present

## 2019-03-20 LAB — GLUCOSE, CAPILLARY
Glucose-Capillary: 116 mg/dL — ABNORMAL HIGH (ref 70–99)
Glucose-Capillary: 128 mg/dL — ABNORMAL HIGH (ref 70–99)
Glucose-Capillary: 191 mg/dL — ABNORMAL HIGH (ref 70–99)

## 2019-03-20 MED ORDER — GABAPENTIN 400 MG PO CAPS
1200.0000 mg | ORAL_CAPSULE | Freq: Two times a day (BID) | ORAL | Status: DC
  Administered 2019-03-20: 13:00:00 1200 mg via ORAL
  Filled 2019-03-20: qty 3

## 2019-03-20 MED ORDER — ACETAMINOPHEN 325 MG PO TABS: 650.0000 mg | ORAL_TABLET | Freq: Four times a day (QID) | ORAL | 0 refills | Status: DC | PRN

## 2019-03-20 MED ORDER — BENZONATATE 100 MG PO CAPS: 100.0000 mg | ORAL_CAPSULE | Freq: Three times a day (TID) | ORAL | 0 refills | Status: DC | PRN

## 2019-03-20 MED ORDER — APIXABAN 5 MG PO TABS: 5.0000 mg | ORAL_TABLET | Freq: Two times a day (BID) | ORAL | 3 refills | Status: DC

## 2019-03-20 MED ORDER — ATORVASTATIN CALCIUM 40 MG PO TABS: 40.0000 mg | ORAL_TABLET | Freq: Every day | ORAL | 1 refills | Status: DC

## 2019-03-20 NOTE — TOC Transition Note (Signed)
Transition of Care [facility]) - CM/SW Discharge Note   Patient Details  Name: Emma [name] MRN: [id number] Date of Birth: October 31, 1945  Transition of Care [facility]) CM/SW Contact:  Jimmy Picket, [place] Phone Number: 03/20/2019, 1:37 PM   Clinical Narrative:    Nurse to call report to [phone]. Pt is going to room 111   Final next level of care: Skilled Nursing Facility Barriers to Discharge: Barriers Resolved   Patient Goals and CMS Choice Patient states their goals for this hospitalization and ongoing recovery are:: to get back home CMS Medicare.gov Compare Post Acute Care list provided to:: Patient Choice offered to / list presented to : Patient  Discharge Placement              Patient chooses bed at: [facility] Patient to be transferred to facility by: [facility] Name of family member notified: Minerva Areola Patient and family notified of of transfer: 03/20/19  Discharge Plan and Services     Post Acute Care Choice: Skilled Nursing Facility                               Social Determinants of Health (SDOH) Interventions     Readmission Risk Interventions No flowsheet data found.

## 2019-03-20 NOTE — Progress Notes (Signed)
Internal Medicine Attending Note:  I have seen and evaluated this patient and I have discussed the plan of care with the house staff. Please see their note for complete details. I concur with their findings.  Reymundo Poll, MD 03/20/2019, 1:26 PM

## 2019-03-20 NOTE — Progress Notes (Signed)
   Subjective:   Emma [name] states she is having lower extremity pain today that is consistent with her neuropathy. She feels current Gabapentin dose and administration is not adequate for pain control.  Otherwise, she feels ready to go to rehab today. All questions and concerns addressed.   Objective:  Vital signs in last 24 hours: Vitals:   03/19/19 2316 03/19/19 2349 03/20/19 0319 03/20/19 0320  BP: 90/66 99/61 111/63   Pulse: 71 87 78   Resp: 19 17 17    Temp: 98 F (36.7 C) 98.4 F (36.9 C) 98.5 F (36.9 C)   TempSrc: Oral Oral Oral   SpO2: 99% 97% 98%   Weight:    59.9 kg  Height:       Physical Exam Vitals and nursing note reviewed.  Constitutional:      General: She is not in acute distress.    Appearance: She is normal weight.  Cardiovascular:     Rate and Rhythm: Normal rate and regular rhythm.     Heart sounds: Normal heart sounds. No murmur. No gallop.   Pulmonary:     Effort: Pulmonary effort is normal. No respiratory distress.  Neurological:     Mental Status: She is alert and oriented to person, place, and time.     Cranial Nerves: Dysarthria present.     Motor: Weakness present.     Comments: 0/5 right upper extremity strength. Right upper extremity is mostly flaccid. Left upper extremity strength 5/5. Her examination is unchanged from the past 2 days.  Psychiatric:        Mood and Affect: Mood normal.        Behavior: Behavior normal.    Assessment/Plan:  Principal Problem:   Stroke (cerebrum) (HCC) Active Problems:   TIA (transient ischemic attack)   DVT (deep venous thrombosis) (HCC)  Emma [name] is a 74 y.o f with diabetes mellitus 2, hypothyroidism who presented with right sided weakness, dysphagia, dysarthria that showed small acute infarcts in left frontal lobbe.  # Left MCA infarcts Imaging findings consistent with embolic source.  Differential includes paroxysmal atrial fibrillation versus PFO. Unable to assess for PFO on TTE  due to inadequate acoustic windows. Transcranial doppler attempted but unsatisfactory temporal windows.   A rapid response was called on patient yesterday afternoon for flaccid RU extremity. Unfortunately, her strength has been persistent 0-1/5. CT head obtained and showed mild enlargement of infarct area. No additional infarcts or hemorrhagic conversion.  Examination is unchanged from the past few days but worse since admission.   - Elliquis 5 mg BID - Lipitor 40 mg  - SNF placement   # T2DM # Diabetic Neuropathy  A1c elevated at 11.1 on admission but BG have been on the lower end here. This may be due to dysphagia/dysarthria from acute stroke limiting po intake. Home med is only Metformin. She may benefit from another agent; will defer to PCP.   - Gabapentin 1200 mg BID   #Dry cough - Tessalon pearls 100 mg TID as needed  # LLE DVT - multiple - Vascular surgery as evaluated; no surgical intervention indicated at this time.  - Elliquis 5 mg BID, lifetime   Dispo: Anticipated discharge today to SNF.   Dr. 62 Internal Medicine PGY-1  Pager: [phone] 03/20/2019, 6:25 AM

## 2019-03-20 NOTE — Progress Notes (Signed)
Report called to Insurance risk surveyor at [facility] rehab facility

## 2019-03-20 NOTE — Progress Notes (Signed)
STROKE TEAM PROGRESS NOTE   INTERVAL HISTORY Patient is sitting up in bed.  She had worsening of her right arm weakness yesterday which has not improved.  CT scan of the head was repeated which showed evolution of her left frontal subcortical   infarct without any acute changes or hemorrhage.  This morning her speech appears slightly improved with right arm weakness remains unchanged.  She is able to move her right thigh against gravity. Vitals:   03/20/19 0319 03/20/19 0320 03/20/19 0743 03/20/19 1258  BP: 111/63  (!) 101/53 (!) 141/72  Pulse: 78  78 85  Resp: 17  18 18   Temp: 98.5 F (36.9 C)  98.5 F (36.9 C) 98.7 F (37.1 C)  TempSrc: Oral  Oral Oral  SpO2: 98%  95% 95%  Weight:  59.9 kg    Height:        CBC:  Recent Labs  Lab 03/18/19 0830 03/19/19 0722  WBC 6.7 5.3  NEUTROABS 4.8 3.1  HGB 11.1* 10.5*  HCT 34.4* 33.8*  MCV 95.3 97.7  PLT 390 443*    Basic Metabolic Panel:  Recent Labs  Lab 03/14/19 0341 03/14/19 0341 03/18/19 0830 03/19/19 0722  NA 142   < > 138 142  K 4.3   < > 4.5 4.1  CL 107   < > 100 102  CO2 27   < > 22 24  GLUCOSE 120*   < > 151* 129*  BUN 12   < > 23 25*  CREATININE 0.97   < > 1.02* 1.00  CALCIUM 8.8*   < > 9.1 9.3  MG 1.6*  --  1.9  --    < > = values in this interval not displayed.   Lipid Panel:     Component Value Date/Time   CHOL 156 03/14/2019 0341   TRIG 139 03/14/2019 0341   HDL 36 (L) 03/14/2019 0341   CHOLHDL 4.3 03/14/2019 0341   VLDL 28 03/14/2019 0341   LDLCALC 92 03/14/2019 0341   HgbA1c:  Lab Results  Component Value Date   HGBA1C 11.1 (H) 03/14/2019   Urine Drug Screen:     Component Value Date/Time   LABOPIA NONE DETECTED 03/14/2019 0516   COCAINSCRNUR NONE DETECTED 03/14/2019 0516   LABBENZ NONE DETECTED 03/14/2019 0516   AMPHETMU NONE DETECTED 03/14/2019 0516   THCU NONE DETECTED 03/14/2019 0516   LABBARB NONE DETECTED 03/14/2019 0516    IMAGING past 24 hours CT HEAD WO CONTRAST  Result  Date: 03/19/2019 CLINICAL DATA:  Stroke patient on anticoagulation with worsening exam EXAM: CT HEAD WITHOUT CONTRAST TECHNIQUE: Contiguous axial images were obtained from the base of the skull through the vertex without intravenous contrast. COMPARISON:  03/13/2019 FINDINGS: Brain: There is new hypoattenuation in the left frontal lobe corresponding to area of infarction on MRI. Extent of involvement appears larger than area of diffusion restriction. There is no acute intracranial hemorrhage or significant mass effect. No additional new loss of gray-white differentiation. Ventricles and sulci are stable in size and configuration. Patchy and confluent areas of hypoattenuation in the supratentorial white matter likely reflect stable chronic microvascular ischemic changes. Vascular: No new finding. Skull: Calvarium is unremarkable. Sinuses/Orbits: No acute finding. Other: None. IMPRESSION: Evolving recent infarction within the left frontal lobe likely with mild progression in size compared to the recent MRI. No acute intracranial hemorrhage or significant mass effect. Electronically Signed   By: 03/15/2019 M.D.   On: 03/19/2019 14:46    PHYSICAL  EXAM  General - Well nourished, well developed, in no apparent distress.  Ophthalmologic - fundi not visualized due to noncooperation.  Cardiovascular - Regular rhythm and rate.  Mental Status -  Level of arousal and orientation to time, place, and person were intact. Language including expression, naming, repetition, comprehension was assessed and found intact.  Mild to moderate dysarthria as well as mild expressive aphasia with some word finding difficulties and disfluency.  No paraphasic errors.  Good comprehension. Fund of Knowledge was assessed and was intact.  Cranial Nerves II - XII - II - Visual field intact OU. III, IV, VI - Extraocular movements intact. V - Facial sensation intact bilaterally. VII - mild right facial droop. VIII - Hearing &  vestibular intact bilaterally. X - Palate elevates symmetrically. XI - Chin turning & shoulder shrug intact bilaterally. XII - Tongue protrusion intact.  Motor Strength - The patient has 0/5 right upper extremity strength with flaccid hypotonia.  Left upper extremity strength is normal.  Right lower extremity strength at the hip is 4/5.  Left lower extremity strength appears normal at the hip.  She has bilateral amputations Motor Tone - Muscle tone was assessed at the neck and appendages and was normal.  Reflexes - The patient's reflexes were symmetrical in all extremities and she had no pathological reflexes.  Sensory - Light touch, temperature/pinprick were assessed and were symmetrical.    Coordination - The patient had normal movements in the hands with no ataxia or dysmetria.  Tremor was absent.  Gait and Station - deferred.  ASSESSMENT/PLAN Ms. Amalya Salmons is a 74 y.o. female with history of hypertension, hyperlipidemia, diabetes presenting with R sided weakness.   Stroke:   Small L frontal lobe infarcts, embolic pattern, could be due to extensive LE DVT with ? PFO or occult afib if no PFO  Code Stroke CT head No acute abnormality. Moderate Small vessel disease. Atrophy.   CTA head mid L M2 occlusion/near occlusion. Intracranial ICA atherosclerosis. B cavernous ICA moderate stenosis. L V4 mid atherosclerosis.   CTA neck B ICA bifurcation w/ mixed plaque. R VA origin moderate/severe atherosclerosis. L VA origin mild/mod atherosclerosis. 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  CT perfusion no core infarct. 28mL L frontal lobe penumbra w/ TMax>6, w/ TMax>4 the 85mL penumbra  MRI  Small L frontal lobe infarcts. Moderate small vessel disease.   Repeat CT head 02/19/2019 shows continuing evolution of left frontal infarcts.  No new infarct or hemorrhage  2D Echo EF 50-55%. No source of embolus   LE doppler left common femoral  vein, SF junction, left femoral vein, left proximal profunda vein, left popliteal vein, left posterior tibial veins, left peroneal veins, and EIV  LDL 92  HgbA1c 11.1  Eliquis for VTE prophylaxis  Recommend TCD bubble study to rule out PFO.  Recommend lifelong anticoagulation given DVT with no activity, no further cardioembolic work-up needed.  aspirin 81 mg daily prior to admission, now on Eliquis 5 mg twice daily for DVT treatment as well as for stroke prevention.  Patient stroke is small, low risk of hemorrhagic conversion at this time.  Therapy recommendations:  SNF  Disposition:  pending   LLE DVT  LE doppler R AKA, L DVT common femoral, SF jxn, femoral, proximal profunda, popliteal, posterior tib, peroneal and EIV.  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  Do  not recommend 10 mg twice daily initial dosing due to acute stroke.  Hypertension  Stable . BP goal now normotensive  Hyperlipidemia  Home meds:  lipitor 10  Now on lipitor 40  LDL 92, goal < 70  Continue statin at discharge  Diabetes type II Uncontrolled Diabetic Neuropathy  HgbA1c 11.1, goal < 7.0  Other Stroke Risk Factors  Advanced age  Cigarette smoker, advised to stop smoking  ETOH use, advised to drink no more than 1 drink(s) a day  Other Active Problems  Hypothyroidism  W/ soft tissue mass on imaging  Right AKA  Dry cough on [facility] day # 7 She presented with embolic left MCA infarct etiology indeterminate but in the circumstances of acute DVT paradoxical embolism is a consideration.  Patient clearly needs to be on anticoagulation quite some time given recurrent DVTs.  Hence will not recommend loop recorder .  Transfer for rehabilitation to skilled nursing facility when bed available.  Stroke team will sign off.  Kindly call for questions.  Greater than 50%  time during this 25-minute visit was spent on counseling and coordination of care and discussion with care team.  Stroke team will sign off kindly call for questions.  Discussed with Dr.Basaraba Antony Contras, MD To contact Stroke Continuity provider, please refer to [email or web address] After hours, contact General Neurology

## 2019-03-20 NOTE — Progress Notes (Signed)
  Speech Language Pathology Treatment: Dysphagia;Cognitive-Linquistic  Patient Details Name: Emma [name] MRN: [id number] DOB: 03-12-1945 Today's Date: 03/20/2019 Time: 2778-2423 SLP Time Calculation (min) (ACUTE ONLY): 14 min  Assessment / Plan / Recommendation Clinical Impression  Pt received in room for skilled ST targeting dysarthria and dysphagia. Patient reports she did not eat much of her breakfast tray because she dislikes the items. ST edu re: requesting preferred food items, patient nodded.  Patient seen with straw sips (self administered) of thin liquids: pt able to grasp straw, but mild/mod anterior labial spillage observed, pt sensate to and able to wipe face. No overt s/sx aspiration. Pt agreeable to trials of puree solids (applesauce): pt with grossly adequate oral phase, minimally prolonged oral transit, and oral clearance achieved after the swallow. No overt s/s aspiration. Patient refused dysphagia 3 or regular solids trials during this session, stating she would only eat a grilled cheese sandwich (ST not able to obtain one for the purposes of this session). Per RN, patient appearing to tolerate current diet of dysphagia 3 solids/thin liquids without known distress.   Pt presents with moderate dysarthria this date. ST provided edu re: strategies to target improved intelligibility including slowing rate, pausing, pacing and over-articulating. Pt able to demonstrate ability to utilize strategies which resulted in improved intelligibility. Pt will benefit from continued ST here and at the next venue of care.   HPI HPI: Pt is a 74 year old female with a past medical history significant for PVD (s/p R AKA), HTN, HLD, hypothyroidism, T2DM, who presented with right-sided weakness and slurred speech upon awakening from her sleep. MRI of the brain: Small acute infarcts within the left frontal lobe. Pt failed [facility] swallow screen due to coughing.       SLP Plan  Continue with current  plan of care       Recommendations  Diet recommendations: Dysphagia 3 (mechanical soft);Thin liquid Liquids provided via: Straw Medication Administration: Whole meds with liquid Supervision: Intermittent supervision to cue for compensatory strategies;Staff to assist with self feeding Compensations: Minimize environmental distractions;Slow rate;Small sips/bites;Lingual sweep for clearance of pocketing Postural Changes and/or Swallow Maneuvers: Seated upright 90 degrees                Oral Care Recommendations: Oral care BID Follow up Recommendations: 24 hour supervision/assistance SLP Visit Diagnosis: Dysphagia, unspecified (R13.10) Plan: Continue with current plan of care       GO                Emma [name] 03/20/2019, 1:20 PM Shella Spearing, M.Ed., CCC-SLP Speech Therapy Acute Rehabilitation [phone]: Acute Rehab office [phone] - pager

## 2019-03-20 NOTE — Progress Notes (Signed)
Pt discharged from facility via [facility] with bag of belongings

## 2019-03-21 ENCOUNTER — Encounter: Payer: Self-pay | Admitting: Internal Medicine

## 2019-03-21 ENCOUNTER — Non-Acute Institutional Stay (SKILLED_NURSING_FACILITY): Payer: Medicare HMO | Admitting: Internal Medicine

## 2019-03-21 DIAGNOSIS — E1169 Type 2 diabetes mellitus with other specified complication: Secondary | ICD-10-CM | POA: Diagnosis not present

## 2019-03-21 DIAGNOSIS — H04123 Dry eye syndrome of bilateral lacrimal glands: Secondary | ICD-10-CM

## 2019-03-21 DIAGNOSIS — E038 Other specified hypothyroidism: Secondary | ICD-10-CM

## 2019-03-21 DIAGNOSIS — E118 Type 2 diabetes mellitus with unspecified complications: Secondary | ICD-10-CM

## 2019-03-21 DIAGNOSIS — N179 Acute kidney failure, unspecified: Secondary | ICD-10-CM | POA: Diagnosis not present

## 2019-03-21 DIAGNOSIS — I82492 Acute embolism and thrombosis of other specified deep vein of left lower extremity: Secondary | ICD-10-CM

## 2019-03-21 DIAGNOSIS — E1142 Type 2 diabetes mellitus with diabetic polyneuropathy: Secondary | ICD-10-CM | POA: Diagnosis not present

## 2019-03-21 DIAGNOSIS — E785 Hyperlipidemia, unspecified: Secondary | ICD-10-CM | POA: Diagnosis not present

## 2019-03-21 DIAGNOSIS — I63512 Cerebral infarction due to unspecified occlusion or stenosis of left middle cerebral artery: Secondary | ICD-10-CM | POA: Diagnosis not present

## 2019-03-21 NOTE — Progress Notes (Signed)
: Provider:  Margit Hanks., MD Location:  [facility] Room Number: 111-P Place of Service:  SNF ([phone])  PCP: Margit Hanks, MD Patient Care Team: Margit Hanks, MD as PCP - General (Internal Medicine) Wanda Plump, MD  Extended Emergency Contact Information Primary Emergency Contact: Temple Pacini Mobile Phone: [phone] Relation: Son Preferred language: English Interpreter needed? No     Allergies: Patient has no known allergies.  Chief Complaint  Patient presents with  . New Admit To SNF    New admission to [facility] SNF    HPI: Patient is a 74 y.o. female with PVD, status post right AKA, hypertension, hyperlipidemia, hypothyroidism, diabetes mellitus type 2 who presented to [facility] with right-sided weakness and slurred speech upon awakening from sleeping.  She had gone to bed at 11 PM the prior day.  In the ED patient was found to have mild anemia with hemoglobin 11.2 CMP was notable for elevated creatinine 1.19 PT/INR normal code stroke was initiated upon the patient's arrival.  She underwent a CT head and CT angio neck and head and CT cerebral perfusion scan.  Findings were consistent with an acute left MCA stroke.  Patient was outside the window for receiving TPA.  Patient was admitted to [facility] from 2/25-3/4.  Patient was seen by neurology who made recommendations for ASA 325 mg daily and Lipitor 40 mg daily.  Creatinine improved with IV fluids..  A1c had been 6.44 years prior but was 11.1 on admission.  Treatment for that was deferred to PCP.  Hospital course was complicated by left lower extremity DVT and patient was started on Eliquis patient is admitted to skilled nursing facility for OT/PT.  While at skilled nursing facility patient will be followed for hypothyroidism treated with Synthroid, polyneuropathy treated with Neurontin and dry eyeTreated with a Visine.  Past Medical History:  Diagnosis Date  .  Arthritis    Back and right shoulder  . Constipation   . Diabetes mellitus    Type II  . Diabetes mellitus without complication (HCC)   . Diabetic neuropathy (HCC)   . History of blood transfusion   . Hypercholesteremia   . Hypertension   . Hypothyroidism   . Pneumonia   . Thyroid disease     Past Surgical History:  Procedure Laterality Date  . ABDOMINAL AORTOGRAM N/A 03/25/2018   Procedure: ABDOMINAL AORTOGRAM;  Surgeon: Maeola Harman, MD;  Location: [facility] INVASIVE CV LAB;  Service: Cardiovascular;  Laterality: N/A;  . ABDOMINAL HYSTERECTOMY    . AMPUTATION Right 03/29/2018   Procedure: AMPUTATION ABOVE KNEE;  Surgeon: Nada Libman, MD;  Location: [facility] OR;  Service: Vascular;  Laterality: Right;  . APPENDECTOMY    . BACK SURGERY    . BYPASS GRAFT FEMORAL-PERONEAL Right 03/26/2018   Procedure: BYPASS GRAFT [place];  Surgeon: Maeola Harman, MD;  Location: [facility] OR;  Service: Vascular;  Laterality: Right;  . EXPLORATORY LAPAROTOMY     Adhesions removed and appendix  . FEMORAL-POPLITEAL BYPASS GRAFT Right 03/27/2018   Procedure: Right lower extremity angiogram, Right Femoral Peroneal artery bypass, Angioplasty Right Femoral Peroneal bypass, Thrombectomy of Femoral peroneal artery;  Surgeon: Maeola Harman, MD;  Location: [facility] OR;  Service: Vascular;  Laterality: Right;  . INTRAOPERATIVE ARTERIOGRAM Right 03/26/2018   Procedure: Intra Operative Arteriogram;  Surgeon: Maeola Harman, MD;  Location: [facility] OR;  Service: Vascular;  Laterality: Right;  . LOWER EXTREMITY ANGIOGRAPHY Bilateral  03/25/2018   Procedure: LOWER EXTREMITY ANGIOGRAPHY;  Surgeon: Maeola Harman, MD;  Location: [facility] INVASIVE CV LAB;  Service: Cardiovascular;  Laterality: Bilateral;  . LUMBAR LAMINECTOMY/DECOMPRESSION MICRODISCECTOMY N/A 09/09/2014   Procedure: L4-L5 DECOMPRESSION ;  Surgeon: Venita Lick, MD;  Location: [facility] OR;  Service: Orthopedics;  Laterality: N/A;  .  PATCH ANGIOPLASTY Right 03/26/2018   Procedure: PATCH ANGIOPLASTY;  Surgeon: Maeola Harman, MD;  Location: [facility] OR;  Service: Vascular;  Laterality: Right;  . SHOULDER SURGERY    . TONSILLECTOMY    . TOTAL THYROIDECTOMY    . VEIN HARVEST Right 03/26/2018   Procedure: Vein Harvest of Right Leg Greater Saphenous Vein in SITU;  Surgeon: Maeola Harman, MD;  Location: [facility] OR;  Service: Vascular;  Laterality: Right;    Allergies as of 03/21/2019   No Known Allergies     Medication List       Accurate as of March 21, 2019 10:18 AM. If you have any questions, ask your nurse or doctor.        STOP taking these medications   Adult Gummy Chew Stopped by: Merrilee Seashore, MD     TAKE these medications   acetaminophen 325 MG tablet Commonly known as: TYLENOL Take 2 tablets (650 mg total) by mouth every 6 (six) hours as needed for mild pain or moderate pain.   apixaban 5 MG Tabs tablet Commonly known as: ELIQUIS Take 1 tablet (5 mg total) by mouth 2 (two) times daily.   atorvastatin 40 MG tablet Commonly known as: LIPITOR Take 1 tablet (40 mg total) by mouth daily at 6 PM.   benzonatate 100 MG capsule Commonly known as: TESSALON Take 1 capsule (100 mg total) by mouth 3 (three) times daily as needed for cough.   Foltanx 3-35-2 MG Tabs Take 1 tablet by mouth daily.   gabapentin 600 MG tablet Commonly known as: NEURONTIN Take 1,200 mg by mouth 2 (two) times daily with breakfast and lunch.   levothyroxine 100 MCG tablet Commonly known as: SYNTHROID Take 100 mcg by mouth daily.   metFORMIN 1000 MG tablet Commonly known as: GLUCOPHAGE Take 1,000 mg by mouth 2 (two) times daily with breakfast and lunch.   VISINE OP Place 1 drop into both eyes daily as needed (dry eyes).       No orders of the defined types were placed in this encounter.   Immunization History  Administered Date(s) Administered  . Influenza, High Dose Seasonal PF 01/01/2015, 03/28/2017,  10/21/2017  . Pneumococcal Conjugate-13 03/02/2016  . Pneumococcal Polysaccharide-23 12/18/2006, 07/13/2017  . Td 08/01/2006    Social History   Tobacco Use  . Smoking status: Current Every Day Smoker    Packs/day: 1.00    Years: 50.00    Pack years: 50.00    Types: Cigarettes  . Smokeless tobacco: Never Used  Substance Use Topics  . Alcohol use: Yes    Comment: occasional    Family history is father with cancer, diabetes mellitus, and hypertension.  No family history on file.    Review of Systems  GENERAL:  no fevers, fatigue, appetite changes SKIN: No itching, or rash EYES: No eye pain, redness, discharge EARS: No earache, tinnitus, change in hearing NOSE: No congestion, drainage or bleeding  MOUTH/THROAT: No mouth or tooth pain, No sore throat RESPIRATORY: No cough, wheezing, SOB CARDIAC: No chest pain, palpitations, lower extremity edema  GI: No abdominal pain, No N/V/D or constipation, No heartburn or reflux  GU: No dysuria, frequency  or urgency, or incontinence  MUSCULOSKELETAL: No unrelieved bone/joint pain NEUROLOGIC: No headache, dizziness or focal weakness PSYCHIATRIC: No c/o anxiety or sadness   Vitals:   03/21/19 0944  BP: 121/74  Pulse: 87  Resp: 18  Temp: 98 F (36.7 C)  SpO2: 90%    SpO2 Readings from Last 1 Encounters:  03/21/19 90%   Body mass index is 20.08 kg/m.     Physical Exam  GENERAL APPEARANCE: Alert, conversant,  No acute distress.  SKIN: No diaphoresis rash HEAD: Normocephalic, atraumatic  EYES: Conjunctiva/lids clear. Pupils round, reactive. EOMs intact.  EARS: External exam WNL, canals clear. Hearing grossly normal.  NOSE: No deformity or discharge.  MOUTH/THROAT: Lips w/o lesions  RESPIRATORY: Breathing is even, unlabored. Lung sounds are clear   CARDIOVASCULAR: Heart RRR no murmurs, rubs or gallops. No peripheral edema.   GASTROINTESTINAL: Abdomen is soft, non-tender, not distended w/ normal bowel  sounds. GENITOURINARY: Bladder non tender, not distended  MUSCULOSKELETAL: Right AKA NEUROLOGIC:  Cranial nerves 2-12 grossly intact except for dysarthria and dysphagia;.  Right upper extremity paralysis small PSYCHIATRIC: Mood and affect appropriate to situation, no behavioral issues  Patient Active Problem List   Diagnosis Date Noted  . DVT (deep venous thrombosis) (HCC) 03/14/2019  . TIA (transient ischemic attack) 03/13/2019  . Stroke (cerebrum) (HCC) 03/13/2019  . Acute ischemic left MCA stroke (HCC)   . PAD (peripheral artery disease) (HCC) 03/23/2018  . Back pain 09/09/2014  . HYPERLIPIDEMIA 12/18/2006  . RECTAL POLYPS 12/18/2006  . 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 SBO 08/01/2006  . GOITER NOS 03/11/2006  . PERIPHERAL NEUROPATHY 03/11/2006  . HYPERTENSION 03/11/2006  . OSTEOPENIA 03/11/2006  . FOOT DROP 03/11/2006      Labs reviewed: Basic Metabolic Panel:    Component Value Date/Time   NA 142 03/19/2019 0722   K 4.1 03/19/2019 0722   CL 102 03/19/2019 0722   CO2 24 03/19/2019 0722   GLUCOSE 129 (H) 03/19/2019 0722   GLUCOSE 83 11/27/2005 1057   BUN 25 (H) 03/19/2019 0722   CREATININE 1.00 03/19/2019 0722   CALCIUM 9.3 03/19/2019 0722   PROT 6.4 (L) 03/13/2019 1628   ALBUMIN 3.4 (L) 03/13/2019 1628   AST 21 03/13/2019 1628   ALT 15 03/13/2019 1628   ALKPHOS 74 03/13/2019 1628   BILITOT 0.3 03/13/2019 1628   GFRNONAA 57 (L) 03/19/2019 0722   GFRAA >60 03/19/2019 0722    Recent Labs    03/14/19 0341 03/18/19 0830 03/19/19 0722  NA 142 138 142  K 4.3 4.5 4.1  CL 107 100 102  CO2 27 22 24   GLUCOSE 120* 151* 129*  BUN 12 23 25*  CREATININE 0.97 1.02* 1.00  CALCIUM 8.8* 9.1 9.3  MG 1.6* 1.9  --    Liver Function Tests: Recent Labs    03/13/19 1628  AST 21  ALT 15  ALKPHOS 74  BILITOT 0.3  PROT 6.4*  ALBUMIN 3.4*   No results for input(s): LIPASE, AMYLASE in the last 8760 hours. No results for input(s): AMMONIA in the last 8760 hours. CBC: Recent Labs     03/13/19 1628 03/13/19 1645 03/14/19 0341 03/18/19 0830 03/19/19 0722  WBC 8.5   < > 7.6 6.7 5.3  NEUTROABS 5.9  --   --  4.8 3.1  HGB 11.2*   < > 10.6* 11.1* 10.5*  HCT 35.1*   < > 33.2* 34.4* 33.8*  MCV 95.1   < > 95.1 95.3 97.7  PLT 354   < >  330 390 443*   < > = values in this interval not displayed.   Lipid Recent Labs    03/14/19 0341  CHOL 156  HDL 36*  LDLCALC 92  TRIG 139    Cardiac Enzymes: No results for input(s): CKTOTAL, CKMB, CKMBINDEX, TROPONINI in the last 8760 hours. BNP: No results for input(s): BNP in the last 8760 hours. Lab Results  Component Value Date   MICROALBUR 0.2 08/01/2006   Lab Results  Component Value Date   HGBA1C 11.1 (H) 03/14/2019   Lab Results  Component Value Date   TSH 9.996 (H) 03/15/2019   No results found for: VITAMINB12 No results found for: FOLATE No results found for: IRON, TIBC, FERRITIN  Imaging and Procedures obtained prior to SNF admission: CT Code Stroke CTA Head W/WO contrast  Result Date: 03/13/2019 CLINICAL DATA:  Stroke code. EXAM: CT ANGIOGRAPHY HEAD AND NECK CT PERFUSION BRAIN TECHNIQUE: Multidetector CT imaging of the head and neck was performed using the standard protocol during bolus administration of intravenous contrast. Multiplanar CT image reconstructions and MIPs were obtained to evaluate the vascular anatomy. Carotid stenosis measurements (when applicable) are obtained utilizing NASCET criteria, using the distal internal carotid diameter as the denominator. Multiphase CT imaging of the brain was performed following IV bolus contrast injection. Subsequent parametric perfusion maps were calculated using RAPID software. CONTRAST:  Administered contrast not known at this time. COMPARISON:  Concurrently performed noncontrast head CT. FINDINGS: CTA NECK FINDINGS Aortic arch: There is an aberrant right subclavian artery. Minimal calcified plaque within the visualized aortic arch and proximal major branch  vessels of the neck. No innominate or proximal subclavian artery stenosis. Right carotid system: CCA and ICA patent within the neck without stenosis. Minimal mixed plaque at the carotid bifurcation. Partially retropharyngeal course of the ICA. Left carotid system: CCA and ICA patent within the neck without stenosis. Minimal mixed plaque at the carotid bifurcation. Partially retropharyngeal course of the ICA. Vertebral arteries: The vertebral arteries are patent within the neck. The left vertebral artery is slightly dominant. Soft plaque at the origin of the right vertebral artery results in moderate/severe ostial stenosis. Calcified plaque at the origin of the left vertebral artery results in mild/moderate ostial stenosis. Skeleton: No acute bony abnormality. Cervical spondylosis with multilevel posterior disc osteophytes, uncovertebral and facet hypertrophy. A prominent posterior disc osteophyte complex at C5-C6 contributes to at least moderate spinal canal narrowing. Other neck: No cervical lymphadenopathy. 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 Upper chest: No consolidation within the imaged lung apices. Review of the MIP images confirms the above findings CTA HEAD FINDINGS Anterior circulation: The intracranial internal carotid arteries are patent with calcified plaque. Sites of up to moderate stenosis within the cavernous segments bilaterally. The M1 middle cerebral arteries are patent without significant stenosis. There is occlusion or near occlusion of a proximal to mid left M2 branch vessel (series 10, image 138) (series 8, images 74-67). The anterior cerebral arteries are patent without high-grade stenosis. No intracranial aneurysm is identified. Posterior circulation: The intracranial vertebral arteries are patent without significant stenosis. Calcified plaque within  the V4 left vertebral artery results in mild stenosis. The basilar artery is patent without significant stenosis. The posterior cerebral arteries are patent without significant proximal stenosis. Posterior communicating arteries are poorly delineated and may be hypoplastic or absent bilaterally. Venous sinuses: Within limitations of contrast  timing, no convincing thrombus. Anatomic variants: As described Review of the MIP images confirms the above findings Findings of a proximal to mid left M2 branch occlusion or near occlusion called by telephone at the time of interpretation on 03/13/2019 at 5:29 pm to provider MCNEILL [facility] , who verbally acknowledged these results. IMPRESSION: CTA neck: 1. The bilateral common and internal carotid arteries are patent within the neck without stenosis. Mild mixed plaque at the carotid bifurcations. 2. The vertebral arteries are patent within the neck bilaterally. Moderate/severe atherosclerotic narrowing at the origin of the right vertebral artery. Mild/moderate atherosclerotic narrowing at the origin of the left vertebral artery. 3. Incidentally noted aberrant right subclavian artery. 4. 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 CTA head: 1. Occlusion or near occlusion of a proximal to mid left M2 MCA branch vessel as described. 2. 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 Sites of up to moderate stenosis within the cavernous segments bilaterally. 3. Mild atherosclerotic narrowing of the V4 left vertebral artery. Electronically Signed   By: Jackey Loge DO   On: 03/13/2019 17:39   CT Code Stroke CTA Neck W/WO contrast  Result Date: 03/13/2019 CLINICAL DATA:  Stroke code. EXAM: CT ANGIOGRAPHY HEAD AND NECK CT PERFUSION BRAIN TECHNIQUE: Multidetector CT imaging of the head and neck was performed using the standard protocol during bolus administration of intravenous  contrast. Multiplanar CT image reconstructions and MIPs were obtained to evaluate the vascular anatomy. Carotid stenosis measurements (when applicable) are obtained utilizing NASCET criteria, using the distal internal carotid diameter as the denominator. Multiphase CT imaging of the brain was performed following IV bolus contrast injection. Subsequent parametric perfusion maps were calculated using RAPID software. CONTRAST:  Administered contrast not known at this time. COMPARISON:  Concurrently performed noncontrast head CT. FINDINGS: CTA NECK FINDINGS Aortic arch: There is an aberrant right subclavian artery. Minimal calcified plaque within the visualized aortic arch and proximal major branch vessels of the neck. No innominate or proximal subclavian artery stenosis. Right carotid system: CCA and ICA patent within the neck without stenosis. Minimal mixed plaque at the carotid bifurcation. Partially retropharyngeal course of the ICA. Left carotid system: CCA and ICA patent within the neck without stenosis. Minimal mixed plaque at the carotid bifurcation. Partially retropharyngeal course of the ICA. Vertebral arteries: The vertebral arteries are patent within the neck. The left vertebral artery is slightly dominant. Soft plaque at the origin of the right vertebral artery results in moderate/severe ostial stenosis. Calcified plaque at the origin of the left vertebral artery results in mild/moderate ostial stenosis. Skeleton: No acute bony abnormality. Cervical spondylosis with multilevel posterior disc osteophytes, uncovertebral and facet hypertrophy. A prominent posterior disc osteophyte complex at C5-C6 contributes to at least moderate spinal canal narrowing. Other neck: No cervical lymphadenopathy. 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 Upper chest: No consolidation within the  imaged lung apices. Review of the MIP images confirms the above findings CTA HEAD FINDINGS Anterior circulation: The intracranial internal carotid arteries are patent with calcified plaque. Sites of up to moderate stenosis within the cavernous segments bilaterally. The M1 middle cerebral arteries are patent without significant stenosis. There is occlusion or near occlusion of a proximal to mid left M2 branch vessel (series 10, image 138) (series 8, images 74-67). The anterior cerebral  arteries are patent without high-grade stenosis. No intracranial aneurysm is identified. Posterior circulation: The intracranial vertebral arteries are patent without significant stenosis. Calcified plaque within the V4 left vertebral artery results in mild stenosis. The basilar artery is patent without significant stenosis. The posterior cerebral arteries are patent without significant proximal stenosis. Posterior communicating arteries are poorly delineated and may be hypoplastic or absent bilaterally. Venous sinuses: Within limitations of contrast timing, no convincing thrombus. Anatomic variants: As described Review of the MIP images confirms the above findings Findings of a proximal to mid left M2 branch occlusion or near occlusion called by telephone at the time of interpretation on 03/13/2019 at 5:29 pm to [facility] , who verbally acknowledged these results. IMPRESSION: CTA neck: 1. The bilateral common and internal carotid arteries are patent within the neck without stenosis. Mild mixed plaque at the carotid bifurcations. 2. The vertebral arteries are patent within the neck bilaterally. Moderate/severe atherosclerotic narrowing at the origin of the right vertebral artery. Mild/moderate atherosclerotic narrowing at the origin of the left vertebral artery. 3. Incidentally noted aberrant right subclavian artery. 4. 15 mm soft tissue mass within the midline neck just beneath the hyoid bone favored to reflect  ectopic thyroid tissue. CTA head: 1. Occlusion or near occlusion of a proximal to mid left M2 MCA branch vessel as described. 2. 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 Sites of up to moderate stenosis within the cavernous segments bilaterally. 3. Mild atherosclerotic narrowing of the V4 left vertebral artery. Electronically Signed   By: Jackey LogeKyle  Golden DO   On: 03/13/2019 17:39   MR BRAIN WO CONTRAST  Result Date: 03/13/2019 CLINICAL DATA:  Stroke, follow-up EXAM: MRI HEAD WITHOUT CONTRAST TECHNIQUE: Multiplanar, multiecho pulse sequences of the brain and surrounding structures were obtained without intravenous contrast. COMPARISON:  Correlation made with CT imaging earlier same day FINDINGS: Brain: There are small areas of restricted diffusion within the left frontal lobe including involvement of the corona radiata and precentral gyrus. Patchy and confluent areas of T2 hyperintensity in the supratentorial and pontine white matter are nonspecific but probably reflect moderate chronic microvascular ischemic changes. Prominence of the ventricles and sulci reflects generalized parenchymal volume loss. There is no intracranial hemorrhage. There is no intracranial mass, mass effect, or edema. There is no hydrocephalus or extra-axial fluid collection. Vascular: Major vessel flow voids at the skull base are preserved. Susceptibility along the distal left M2 MCA distribution likely corresponds to area of thrombus on CTA. Skull and upper cervical spine: Normal marrow signal is preserved. Sinuses/Orbits: Minor mucosal thickening. Bilateral lens replacements. Other: Sella is unremarkable.  Mastoid air cells are clear. IMPRESSION: Small acute infarcts within the left frontal lobe. Moderate chronic microvascular ischemic changes. Electronically Signed   By: Guadlupe SpanishPraneil  Patel M.D.   On: 03/13/2019 21:03   CT CEREBRAL PERFUSION W CONTRAST  Result Date: 03/13/2019 CLINICAL DATA:  Neuro deficit,  subacute. EXAM: CT PERFUSION BRAIN TECHNIQUE: Multiphase CT imaging of the brain was performed following IV bolus contrast injection. Subsequent parametric perfusion maps were calculated using RAPID software. CONTRAST:  50mL OMNIPAQUE IOHEXOL 350 MG/ML SOLN COMPARISON:  Noncontrast head CT and CT angiogram head/neck performed earlier the same day. FINDINGS: CT Brain Perfusion Findings: CBF (<30%) Volume: 0mL Perfusion (Tmax>6.0s) volume: 6mL in the left frontal lobe MCA territory (utilizing the Tmax greater than 4 seconds threshold, there is 21 mL of hypoperfusion in this region). Mismatch Volume: 6mL ASPECTS on noncontrast CT Head: 10 at 4:53 p.m. today. Infarct Core:  0 mL Infarction Location:None identified These results were communicated to Dr. Amada Jupiter At 6:09 pmon 2/25/2021by text page via the [facility] messaging system. IMPRESSION: The perfusion software identifies a 6 mL region of critically hypoperfused parenchyma in the left frontal lobe MCA vascular territory utilizing the Tmax>6 seconds threshold. The perfusion software identifies no core infarct. Reported mismatch volume 6 mL. Of note, utilizing a Tmax > 4 seconds threshold, there is a 21 mL region of hypoperfusion in the left MCA territory. Electronically Signed   By: Jackey Loge DO   On: 03/13/2019 18:16   ECHOCARDIOGRAM COMPLETE  Result Date: 03/14/2019    ECHOCARDIOGRAM REPORT   Patient Name:   LEEAN AMEZCUA Date of Exam: 03/14/2019 Medical Rec #:  [id number]         Height:       68.0 in Accession #:    [id number]        Weight:       148.4 lb Date of Birth:  09/08/1945        BSA:          1.800 m Patient Age:    73 years          BP:           130/73 mmHg Patient Gender: F                 HR:           61 bpm. Exam Location:  Inpatient Procedure: 2D Echo Indications:    stroke 434.91  History:        Patient has no prior history of Echocardiogram examinations.                 Risk Factors:Hypertension and Dyslipidemia.  Sonographer:     Delcie Roch Referring Phys: [id number] Otha Rickles N RAINES IMPRESSIONS  1. Abnormal septal motion. Left ventricular ejection fraction, by estimation, is 50 to 55%. The left ventricle has low normal function. The left ventricle has no regional wall motion abnormalities. Left ventricular diastolic parameters were normal.  2. Right ventricular systolic function is normal. The right ventricular size is normal.  3. Bubble study not performed.  4. The mitral valve is normal in structure and function. No evidence of mitral valve regurgitation. No evidence of mitral stenosis.  5. The aortic valve is normal in structure and function. Aortic valve regurgitation is not visualized. No aortic stenosis is present.  6. The inferior vena cava is normal in size with greater than 50% respiratory variability, suggesting right atrial pressure of 3 mmHg. FINDINGS  Left Ventricle: Abnormal septal motion. Left ventricular ejection fraction, by estimation, is 50 to 55%. The left ventricle has low normal function. The left ventricle has no regional wall motion abnormalities. The left ventricular internal cavity size was normal in size. There is no left ventricular hypertrophy. Left ventricular diastolic parameters were normal. Right Ventricle: The right ventricular size is normal. No increase in right ventricular wall thickness. Right ventricular systolic function is normal. Left Atrium: Left atrial size was normal in size. Right Atrium: Right atrial size was normal in size. Pericardium: There is no evidence of pericardial effusion. Mitral Valve: The mitral valve is normal in structure and function. Normal mobility of the mitral valve leaflets. No evidence of mitral valve regurgitation. No evidence of mitral valve stenosis. Tricuspid Valve: The tricuspid valve is normal in structure. Tricuspid valve regurgitation is not demonstrated. No evidence of tricuspid stenosis. Aortic Valve: The aortic valve is normal in  structure and function.  Aortic valve regurgitation is not visualized. No aortic stenosis is present. Pulmonic Valve: The pulmonic valve was normal in structure. Pulmonic valve regurgitation is not visualized. No evidence of pulmonic stenosis. Aorta: The aortic root is normal in size and structure. Venous: The inferior vena cava is normal in size with greater than 50% respiratory variability, suggesting right atrial pressure of 3 mmHg. IAS/Shunts: No atrial level shunt detected by color flow Doppler.  LEFT VENTRICLE PLAX 2D LVIDd:         4.10 cm  Diastology LVIDs:         2.70 cm  LV e' lateral:   10.00 cm/s LV PW:         0.80 cm  LV E/e' lateral: 8.2 LV IVS:        0.70 cm  LV e' medial:    8.81 cm/s LVOT diam:     1.90 cm  LV E/e' medial:  9.3 LV SV:         50 LV SV Index:   28 LVOT Area:     2.84 cm  RIGHT VENTRICLE RV S prime:     12.60 cm/s TAPSE (M-mode): 1.8 cm LEFT ATRIUM             Index LA diam:        2.90 cm 1.61 cm/m LA Vol (A2C):   49.6 ml 27.55 ml/m LA Vol (A4C):   33.4 ml 18.55 ml/m LA Biplane Vol: 41.7 ml 23.16 ml/m  AORTIC VALVE LVOT Vmax:   95.30 cm/s LVOT Vmean:  58.900 cm/s LVOT VTI:    0.178 m  AORTA Ao Root diam: 2.70 cm MITRAL VALVE MV Area (PHT): 3.77 cm    SHUNTS MV Decel Time: 201 msec    Systemic VTI:  0.18 m MV E velocity: 82.00 cm/s  Systemic Diam: 1.90 cm MV A velocity: 78.10 cm/s MV E/A ratio:  1.05 Charlton HawsPeter Nishan MD Electronically signed by Charlton HawsPeter Nishan MD Signature Date/Time: 03/14/2019/9:08:55 AM    Final    CT HEAD CODE STROKE WO CONTRAST  Result Date: 03/13/2019 CLINICAL DATA:  Code stroke. Focal neuro deficit, greater than 6 hours, stroke suspected. Additional history provided: Last known normal 23:00, slurred speech, right-sided weakness. EXAM: CT HEAD WITHOUT CONTRAST TECHNIQUE: Contiguous axial images were obtained from the base of the skull through the vertex without intravenous contrast. COMPARISON:  No pertinent prior studies available for comparison. FINDINGS: Brain: There is no  evidence of acute intracranial hemorrhage, intracranial mass, midline shift or extra-axial fluid collection. No demarcated cortical infarction is identified. Moderate/advanced patchy and confluent hypodensity within the cerebral white matter is nonspecific, but consistent with chronic small vessel ischemic disease. Moderate generalized parenchymal atrophy. Vascular: No hyperdense vessel.  Atherosclerotic calcifications. Skull: Normal. Negative for fracture or focal lesion. Sinuses/Orbits: Visualized orbits demonstrate no acute abnormality. Small left sphenoid sinus mucous retention cyst. No significant mastoid effusion. These results were communicated to Dr. Amada JupiterKirkpatrick At 5:09 pmon 2/25/2021by text page via the [facility]. IMPRESSION: No CT evidence of acute intracranial abnormality. Moderate/advanced chronic small vessel ischemic disease. Moderate generalized parenchymal atrophy Electronically Signed   By: Jackey LogeKyle  Golden DO   On: 03/13/2019 17:09   VAS US LOWER EXTREMITY VENOUS (DVT)  Result Date: 03/14/2019  Lower Venous DVTStudy Indications: Stroke.  Risk Factors: Surgery Right above knee amputation. Limitations: Poor ultrasound/tissue interface and body habitus. Comparison Study: No prior exam. Performing Technologist: Kennedy BuckerKristy Siebrecht ARDMS, RVT  Examination Guidelines: A complete evaluation  includes B-mode imaging, spectral Doppler, color Doppler, and power Doppler as needed of all accessible portions of each vessel. Bilateral testing is considered an integral part of a complete examination. Limited examinations for reoccurring indications may be performed as noted. The reflux portion of the exam is performed with the patient in reverse Trendelenburg.  +-------+---------------+---------+-----------+----------+--------------+ RIGHT  CompressibilityPhasicitySpontaneityPropertiesThrombus Aging +-------+---------------+---------+-----------+----------+--------------+ CFV    Full            Yes      Yes                                 +-------+---------------+---------+-----------+----------+--------------+ SFJ                                                 Not visualized +-------+---------------+---------+-----------+----------+--------------+ FV ProxFull                                                        +-------+---------------+---------+-----------+----------+--------------+ Poory visualized, possibly due to above knee amputation.  +---------+---------------+---------+-----------+----------+-------------------+ LEFT     CompressibilityPhasicitySpontaneityPropertiesThrombus Aging      +---------+---------------+---------+-----------+----------+-------------------+ CFV      Partial        Yes      Yes                  Acute               +---------+---------------+---------+-----------+----------+-------------------+ SFJ      Partial                                                          +---------+---------------+---------+-----------+----------+-------------------+ FV Prox  Partial                                                          +---------+---------------+---------+-----------+----------+-------------------+ FV Mid   Partial                                                          +---------+---------------+---------+-----------+----------+-------------------+ FV DistalPartial                                                          +---------+---------------+---------+-----------+----------+-------------------+ PFV      Partial                                                          +---------+---------------+---------+-----------+----------+-------------------+  POP      Partial        No       No                   continuous flow     +---------+---------------+---------+-----------+----------+-------------------+ PTV                                                   poorly visualized,                                                         no color flow       +---------+---------------+---------+-----------+----------+-------------------+ PERO                                                  poorly visualized,                                                        no color flow       +---------+---------------+---------+-----------+----------+-------------------+ EIV      Partial        Yes      Yes                                      +---------+---------------+---------+-----------+----------+-------------------+ IVC                                                   Not visualized      +---------+---------------+---------+-----------+----------+-------------------+     Summary: RIGHT: - There is no evidence of deep vein thrombosis in the CFV, FV prox, and prof. Right above knee amputation.  LEFT: - Findings consistent with acute deep vein thrombosis involving the left common femoral vein, SF junction, left femoral vein, left proximal profunda vein, left popliteal vein, left posterior tibial veins, left peroneal veins, and EIV. - No cystic structure found in the popliteal fossa.  *See table(s) above for measurements and observations. Electronically signed by Lemar Livings MD on 03/14/2019 at 3:23:46 PM.    Final      Not all labs, radiology exams or other studies done during hospitalization come through on my EPIC note; however they are reviewed by me.    Assessment and Plan  Left MCA infarct-patient presented with code stroke due to inability his right arm and slurred speech; trouble cooperative-year-old 6 mm area of critically hypoperfused parenchyma on the left frontal lobe MCA territory.  Lipid panel with HDL 36, LDL 92 and A1c 11.1; speech therapy noted dysarthria which has improved approximately 75%.  Unable to obtain TTE, transcranial Doppler.  No atrial fibrillation noted on telemetry.  Patient was  started on low continue Lipitor 40 mg daily and Eliquis 5 mg  twice daily SNF-admitted for OT/PT/ST; continue Lipitor 40 mg daily and Eliquis 5 mg twice daily  Left lower extremity DVT-started on Eliquis 5 mg twice daily SNF-continue Eliquis 5 mg twice daily  Diabetes mellitus type 2-hemoglobin A1c 11.1 on admission, 4 years ago at 6.4; blood sugars have been on the lower end during admission 100 150, this may be due to dysphagia dysarthria SNF-continue Metformin 1000 mg twice daily; check CBGs 3 times a day; patient may need more medicine  AKI-creatinine elevated to 1.1 on admission; improved with IV fluids  SNF-follow-up BMP  Hypothyroidism-15 mm soft tissue mass seen on CT angio of neck at the midline just made the hyoid bone; 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 twice daily  Dry eyes SNF-continue Visine 1 drop each eye daily as needed    Time spent greater than 45 minutes;> 50% of time with patient was spent reviewing records, labs, tests and studies, counseling and developing plan of care  Margit Hanks, MD

## 2019-03-22 ENCOUNTER — Encounter: Payer: Self-pay | Admitting: Internal Medicine

## 2019-03-22 DIAGNOSIS — E039 Hypothyroidism, unspecified: Secondary | ICD-10-CM | POA: Insufficient documentation

## 2019-03-22 DIAGNOSIS — N179 Acute kidney failure, unspecified: Secondary | ICD-10-CM | POA: Insufficient documentation

## 2019-03-22 DIAGNOSIS — H04123 Dry eye syndrome of bilateral lacrimal glands: Secondary | ICD-10-CM | POA: Insufficient documentation

## 2019-03-25 DIAGNOSIS — I10 Essential (primary) hypertension: Secondary | ICD-10-CM | POA: Diagnosis not present

## 2019-03-25 DIAGNOSIS — D649 Anemia, unspecified: Secondary | ICD-10-CM | POA: Diagnosis not present

## 2019-03-31 ENCOUNTER — Ambulatory Visit: Payer: Medicare HMO

## 2019-03-31 ENCOUNTER — Inpatient Hospital Stay (HOSPITAL_COMMUNITY): Admission: RE | Admit: 2019-03-31 | Payer: Medicare HMO | Source: Ambulatory Visit

## 2019-03-31 NOTE — Progress Notes (Deleted)
HISTORY AND PHYSICAL     CC:  follow up. Requesting Provider:  No ref. provider found  HPI: This is a 74 y.o. female who is here today for follow up for hx of hypertension, hyperlipidemia, T2DM, and peripheral vascular disease s/p right Above knee amputation 03/29/18 by Dr. Trula Slade for limb ischemia.    She recently was admitted  2/252021 with right sided weakness and slurred speech and found to have acute left MCA stroke with atherosclerosis of intracranial internal carotid arteries on CT. She apparently is back to her baseline today, however was complaining of pain in her left leg earlier today. This resolved following administration of Gabapentin. She is presently not complaining of any left lower extremity pain, swelling, weakness, coldness or numbness. She usually is wheel chair bound but independent at home. A Left lower extremity venous ultrasound was ordered showing deep vein thrombosis involving the left common femoral vein, SFJ, left femoral vein, left proximal profunda vein, left popliteal vein, left posterior tibial veins, left peroneal veins, and EIV.She presently is on Lovenox. Vascular was consulted to see the patient and evaluate her DVT of left common femoral vein, SFJ, femoral vein, proximal profunda vein, popliteal vein, posterior tibial vein, peroneal vein, and EIV.  She was placed on full dose AC when ok with neurology.    Her carotid artery duplex was clean.  Her IVC/iliac study did not demonstrate thrombus but needed AC regardless for 3 months.   The pt returns today for follow up ABI's of the left leg. Pt had an aortogram on 03/25/2018 by Dr. Donzetta Matters that revealed left SFA that occludes at the above-knee popliteal that seemed to reconstitute at the peroneal artery as well which gave off posterior tibial at the ankle.  She was advised to protect her foot.  Her ABI had declined at her last visit.   The pt is on a statin for cholesterol management.    The pt is not on an aspirin.     Other AC:  Eliquis The pt is not on meds for hypertension.  The pt does have diabetes. Tobacco hx:  ***   Past Medical History:  Diagnosis Date  . Arthritis    Back and right shoulder  . Constipation   . Diabetes mellitus    Type II  . Diabetes mellitus without complication ([place])   . Diabetic neuropathy ([place])   . History of blood transfusion   . Hypercholesteremia   . Hypertension   . Hypothyroidism   . Pneumonia   . Thyroid disease     Past Surgical History:  Procedure Laterality Date  . ABDOMINAL AORTOGRAM N/A 03/25/2018   Procedure: ABDOMINAL AORTOGRAM;  Surgeon: Waynetta Sandy, MD;  Location: [facility];  Service: Cardiovascular;  Laterality: N/A;  . ABDOMINAL HYSTERECTOMY    . AMPUTATION Right 03/29/2018   Procedure: AMPUTATION ABOVE KNEE;  Surgeon: Serafina Mitchell, MD;  Location: [place];  Service: Vascular;  Laterality: Right;  . APPENDECTOMY    . BACK SURGERY    . BYPASS GRAFT FEMORAL-PERONEAL Right 03/26/2018   Procedure: BYPASS GRAFT [place] Stall;  Surgeon: Waynetta Sandy, MD;  Location: [place];  Service: Vascular;  Laterality: Right;  . EXPLORATORY LAPAROTOMY     Adhesions removed and appendix  . FEMORAL-POPLITEAL BYPASS GRAFT Right 03/27/2018   Procedure: Right lower extremity angiogram, Right Femoral Peroneal artery bypass, Angioplasty Right Femoral Peroneal bypass, Thrombectomy of Femoral peroneal artery;  Surgeon: Waynetta Sandy, MD;  Location: [place];  Service: Vascular;  Laterality: Right;  . INTRAOPERATIVE ARTERIOGRAM Right 03/26/2018   Procedure: Intra Operative Arteriogram;  Surgeon: Maeola Harman, MD;  Location: [facility] OR;  Service: Vascular;  Laterality: Right;  . LOWER EXTREMITY ANGIOGRAPHY Bilateral 03/25/2018   Procedure: LOWER EXTREMITY ANGIOGRAPHY;  Surgeon: Maeola Harman, MD;  Location: [facility];  Service: Cardiovascular;  Laterality: Bilateral;  . LUMBAR LAMINECTOMY/DECOMPRESSION  MICRODISCECTOMY N/A 09/09/2014   Procedure: L4-L5 DECOMPRESSION ;  Surgeon: Venita Lick, MD;  Location: [facility] OR;  Service: Orthopedics;  Laterality: N/A;  . PATCH ANGIOPLASTY Right 03/26/2018   Procedure: PATCH ANGIOPLASTY;  Surgeon: Maeola Harman, MD;  Location: [facility];  Service: Vascular;  Laterality: Right;  . SHOULDER SURGERY    . TONSILLECTOMY    . TOTAL THYROIDECTOMY    . VEIN HARVEST Right 03/26/2018   Procedure: Vein Harvest of Right Leg Greater Saphenous Vein in SITU;  Surgeon: Maeola Harman, MD;  Location: [facility] OR;  Service: Vascular;  Laterality: Right;    No Known Allergies  Current Outpatient Medications  Medication Sig Dispense Refill  . acetaminophen (TYLENOL) 325 MG tablet Take 2 tablets (650 mg total) by mouth every 6 (six) hours as needed for mild pain or moderate pain. 30 tablet 0  . apixaban (ELIQUIS) 5 MG TABS tablet Take 1 tablet (5 mg total) by mouth 2 (two) times daily. 60 tablet 3  . atorvastatin (LIPITOR) 40 MG tablet Take 1 tablet (40 mg total) by mouth daily at 6 PM. 30 tablet 1  . benzonatate (TESSALON) 100 MG capsule Take 1 capsule (100 mg total) by mouth 3 (three) times daily as needed for cough. 20 capsule 0  . gabapentin (NEURONTIN) 600 MG tablet Take 1,200 mg by mouth 2 (two) times daily with breakfast and lunch.    Marland Kitchen L-Methylfolate-B6-B12 (FOLTANX) 3-35-2 MG TABS Take 1 tablet by mouth daily.     Marland Kitchen levothyroxine (SYNTHROID, LEVOTHROID) 100 MCG tablet Take 100 mcg by mouth daily.    . metFORMIN (GLUCOPHAGE) 1000 MG tablet Take 1,000 mg by mouth 2 (two) times daily with breakfast and lunch.    . Tetrahydrozoline HCl (VISINE OP) Place 1 drop into both eyes daily as needed (dry eyes).     No current facility-administered medications for this visit.    No family history on file.  Social History   Socioeconomic History  . Marital status: Married    Spouse name: Not on file  . Number of children: Not on file  . Years of education: Not  on file  . Highest education level: Not on file  Occupational History  . Not on file  Tobacco Use  . Smoking status: Current Every Day Smoker    Packs/day: 1.00    Years: 50.00    Pack years: 50.00    Types: Cigarettes  . Smokeless tobacco: Never Used  Substance and Sexual Activity  . Alcohol use: Yes    Comment: occasional  . Drug use: Never  . Sexual activity: Not on file  Other Topics Concern  . Not on file  Social History Narrative   ** Merged History Encounter **      Current daily smoker.  Uses wheelchair for ambulation.  Lives in an apartment by herself - patient passed away in 16-May-2010. Has groceries delivered.  Independent in ADLs.    Social Determinants of Health   Financial Resource Strain:   . Difficulty of Paying Living Expenses:   Food Insecurity:   . Worried About  Running Out of Food in the Last Year:   . Ran Out of Food in the Last Year:   Transportation Needs:   . Lack of Transportation (Medical):   Marland Kitchen Lack of Transportation (Non-Medical):   Physical Activity:   . Days of Exercise per Week:   . Minutes of Exercise per Session:   Stress:   . Feeling of Stress :   Social Connections:   . Frequency of Communication with Friends and Family:   . Frequency of Social Gatherings with Friends and Family:   . Attends Religious Services:   . Active Member of Clubs or Organizations:   . Attends Banker Meetings:   Marland Kitchen Marital Status:   Intimate Partner Violence:   . Fear of Current or Ex-Partner:   . Emotionally Abused:   Marland Kitchen Physically Abused:   . Sexually Abused:      REVIEW OF SYSTEMS:  *** [X]  denotes positive finding, [ ]  denotes negative finding Cardiac  Comments:  Chest pain or chest pressure:    Shortness of breath upon exertion:    Short of breath when lying flat:    Irregular heart rhythm:        Vascular    Pain in calf, thigh, or hip brought on by ambulation:    Pain in feet at night that wakes you up from your sleep:     Blood  clot in your veins:    Leg swelling:         Pulmonary    Oxygen at home:    Productive cough:     Wheezing:         Neurologic    Sudden weakness in arms or legs:     Sudden numbness in arms or legs:     Sudden onset of difficulty speaking or slurred speech:    Temporary loss of vision in one eye:     Problems with dizziness:         Gastrointestinal    Blood in stool:     Vomited blood:         Genitourinary    Burning when urinating:     Blood in urine:        Psychiatric    Major depression:         Hematologic    Bleeding problems:    Problems with blood clotting too easily:        Skin    Rashes or ulcers:        Constitutional    Fever or chills:      PHYSICAL EXAMINATION:  ***  General:  WDWN in NAD; vital signs documented above Gait: Not observed HENT: WNL, normocephalic Pulmonary: normal non-labored breathing , without Rales, rhonchi,  wheezing Cardiac: {Desc; regular/irreg:14544} HR, without  Murmurs; {With/Without:20273} carotid bruit*** Abdomen: soft, NT, no masses Skin: {With/Without:20273} rashes Vascular Exam/Pulses:  Right Left  Radial {Exam; arterial pulse strength 0-4:30167} {Exam; arterial pulse strength 0-4:30167}  Ulnar {Exam; arterial pulse strength 0-4:30167} {Exam; arterial pulse strength 0-4:30167}  Femoral {Exam; arterial pulse strength 0-4:30167} {Exam; arterial pulse strength 0-4:30167}  Popliteal {Exam; arterial pulse strength 0-4:30167} {Exam; arterial pulse strength 0-4:30167}  DP {Exam; arterial pulse strength 0-4:30167} {Exam; arterial pulse strength 0-4:30167}  PT {Exam; arterial pulse strength 0-4:30167} {Exam; arterial pulse strength 0-4:30167}   Extremities: {With/Without:20273} ischemic changes, {With/Without:20273} Gangrene , {With/Without:20273} cellulitis; {With/Without:20273} open wounds;  Musculoskeletal: no muscle wasting or atrophy  Neurologic: A&O X 3;  No focal weakness or  paresthesias are  detected Psychiatric:  The pt has {Desc; normal/abnormal:11317::"Normal"} affect.   Non-Invasive Vascular Imaging:   ABI's/TBI's on 03/31/2019: Right:  amputation Left:  ***    Previous ABI's/TBI's on 12/30/2018: Right:  Amputation Left:  0.43   ASSESSMENT/PLAN:: 74 y.o. female with PAD and hx of right AKA and decreased ABI on the left  -***   Doreatha Massed, PA-C [facility] [phone]  Clinic MD:   Myra Gianotti

## 2019-04-02 ENCOUNTER — Encounter: Payer: Self-pay | Admitting: Internal Medicine

## 2019-04-02 ENCOUNTER — Other Ambulatory Visit: Payer: Self-pay | Admitting: Internal Medicine

## 2019-04-02 ENCOUNTER — Other Ambulatory Visit: Payer: Self-pay

## 2019-04-02 ENCOUNTER — Non-Acute Institutional Stay (SKILLED_NURSING_FACILITY): Payer: Medicare HMO | Admitting: Internal Medicine

## 2019-04-02 DIAGNOSIS — I63512 Cerebral infarction due to unspecified occlusion or stenosis of left middle cerebral artery: Secondary | ICD-10-CM | POA: Diagnosis not present

## 2019-04-02 DIAGNOSIS — I82492 Acute embolism and thrombosis of other specified deep vein of left lower extremity: Secondary | ICD-10-CM

## 2019-04-02 DIAGNOSIS — N289 Disorder of kidney and ureter, unspecified: Secondary | ICD-10-CM | POA: Diagnosis not present

## 2019-04-02 DIAGNOSIS — D649 Anemia, unspecified: Secondary | ICD-10-CM | POA: Diagnosis not present

## 2019-04-02 DIAGNOSIS — E118 Type 2 diabetes mellitus with unspecified complications: Secondary | ICD-10-CM

## 2019-04-02 DIAGNOSIS — I10 Essential (primary) hypertension: Secondary | ICD-10-CM | POA: Diagnosis not present

## 2019-04-02 MED ORDER — TRAMADOL HCL 50 MG PO TABS: 50.0000 mg | ORAL_TABLET | Freq: Four times a day (QID) | ORAL | 0 refills | Status: DC | PRN

## 2019-04-02 NOTE — Progress Notes (Signed)
This encounter was created in error - please disregard.

## 2019-04-02 NOTE — Progress Notes (Signed)
Location:    [facility] Room Number: 111/P Place of Service:  SNF [phone]) Provider:  Juanetta BeetsArlo Kirstyn Lean  Alexander, Anne D, MD  Patient Care Team: Margit HanksAlexander, Anne D, MD as PCP - General (Internal Medicine) Wanda PlumpPaz, Jose E, MD  Extended Emergency Contact Information Primary Emergency Contact: Temple Pacininderson, Eric Mobile Phone: [phone] Relation: Son Preferred language: English Interpreter needed? No  Code Status:  DNR Goals of care: Advanced Directive information Advanced Directives 04/02/2019  Does Patient Have a Medical Advance Directive? Yes  Type of Advance Directive Out of facility DNR (pink MOST or yellow form)  Does patient want to make changes to medical advance directive? No - Patient declined  Would patient like information on creating a medical advance directive? -     Chief Complaint  Patient presents with   Acute Visit    Renal Insuffiency    HPI:  Pt is a 74 y.o. female seen today for an acute visit for follow-up of renal insufficiency.  --- Patient is here for rehab after hospitalization for an acute CVA with right-sided weakness and slurred speech.  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  Neurology recommended aspirin as well as Lipitor.  Her creatinine was 1.19 at that point she did receive some IV fluids.  Her hospitalization was complicated by left lower extremity DVT and she was started on Eliquis.  Marland Kitchen.  Updated creatinine is 1.68 with a BUN of 56.4.  Per nursing her fluid intake is limited.  Currently she is lying in bed she does not appear to have any complaints  .  Vital signs appear to be stable.      Past Medical History:  Diagnosis Date   Arthritis    Back and right shoulder   Constipation    Diabetes mellitus    Type II   Diabetes mellitus without complication (HCC)    Diabetic neuropathy (HCC)    History of blood transfusion    Hypercholesteremia    Hypertension    Hypothyroidism     Pneumonia    Thyroid disease    Past Surgical History:  Procedure Laterality Date   ABDOMINAL AORTOGRAM N/A 03/25/2018   Procedure: ABDOMINAL AORTOGRAM;  Surgeon: Maeola Harmanain, Brandon Christopher, MD;  [facility];  Service: Cardiovascular;  Laterality: N/A;   ABDOMINAL HYSTERECTOMY     AMPUTATION Right 03/29/2018   Procedure: AMPUTATION ABOVE KNEE;  Surgeon: Nada LibmanBrabham, Vance W, MD;  Location: [facility] OR;  Service: Vascular;  Laterality: Right;   APPENDECTOMY     BACK SURGERY     BYPASS GRAFT FEMORAL-PERONEAL Right 03/26/2018   Procedure: BYPASS GRAFT FEMORAL-PERONEAL;  Surgeon: Maeola Harmanain, Brandon Christopher, MD;  [facility];  Service: Vascular;  Laterality: Right;   EXPLORATORY LAPAROTOMY     Adhesions removed and appendix   FEMORAL-POPLITEAL BYPASS GRAFT Right 03/27/2018   Procedure: Right lower extremity angiogram, Right Femoral Peroneal artery bypass, Angioplasty Right Femoral Peroneal bypass, Thrombectomy of Femoral peroneal artery;  Surgeon: Maeola Harmanain, Brandon Christopher, MD;  [facility];  Service: Vascular;  Laterality: Right;   INTRAOPERATIVE ARTERIOGRAM Right 03/26/2018   Procedure: Intra Operative Arteriogram;  Surgeon: Maeola Harmanain, Brandon Christopher, MD;  [facility];  Service: Vascular;  Laterality: Right;   LOWER EXTREMITY ANGIOGRAPHY Bilateral 03/25/2018   Procedure: LOWER EXTREMITY ANGIOGRAPHY;  Surgeon: Maeola Harmanain, Brandon Christopher, MD;  [facility];  Service: Cardiovascular;  Laterality: Bilateral;   LUMBAR LAMINECTOMY/DECOMPRESSION MICRODISCECTOMY N/A 09/09/2014   Procedure: L4-L5  DECOMPRESSION ;  Surgeon: Venita Lick, MD;  Location: [facility] OR;  Service: Orthopedics;  Laterality: N/A;   PATCH ANGIOPLASTY Right 03/26/2018   Procedure: PATCH ANGIOPLASTY;  Surgeon: Maeola Harman, MD;  Location[facility] OR;  Service: Vascular;  Laterality: Right;   SHOULDER SURGERY     TONSILLECTOMY     TOTAL THYROIDECTOMY     VEIN HARVEST Right 03/26/2018    Procedure: Vein Harvest of Right Leg Greater Saphenous Vein in SITU;  Surgeon: Maeola Harman, MD;  Location: [facility] OR;  Service: Vascular;  Laterality: Right;    No Known Allergies  Outpatient Encounter Medications as of 04/02/2019  Medication Sig   acetaminophen (TYLENOL) 325 MG tablet Take 2 tablets (650 mg total) by mouth every 6 (six) hours as needed for mild pain or moderate pain.   Amino Acids-Protein Hydrolys (FEEDING SUPPLEMENT, PRO-STAT SUGAR FREE 64,) LIQD Take 30 mLs by mouth daily. To aid in wound healing   apixaban (ELIQUIS) 5 MG TABS tablet Take 1 tablet (5 mg total) by mouth 2 (two) times daily.   atorvastatin (LIPITOR) 40 MG tablet Take 1 tablet (40 mg total) by mouth daily at 6 PM.   benzonatate (TESSALON) 100 MG capsule Take 1 capsule (100 mg total) by mouth 3 (three) times daily as needed for cough.   bisacodyl (DULCOLAX) 10 MG suppository If not relieved by MOM, give 10 mg Bisacodyl suppositiory rectally X 1 dose in 24 hours as needed (Do not use constipation standing orders for residents with renal failure/CFR less than 30. Contact MD for orders) (Physician Order)   gabapentin (NEURONTIN) 600 MG tablet Take 1,200 mg by mouth 2 (two) times daily with breakfast and lunch.   Glucerna (GLUCERNA) LIQD Take 237 mLs by mouth 3 (three) times daily between meals. Poor intake   L-Methylfolate-B6-B12 (FOLTANX) 3-35-2 MG TABS Take 1 tablet by mouth daily.    levothyroxine (SYNTHROID, LEVOTHROID) 100 MCG tablet Take 100 mcg by mouth daily.   magnesium hydroxide (MILK OF MAGNESIA) 400 MG/5ML suspension If no BM in 3 days, give 30 cc Milk of Magnesium p.o. x 1 dose in 24 hours as needed (Do not use standing constipation orders for residents with renal failure CFR less than 30. Contact MD for orders) (Physician Order)   metFORMIN (GLUCOPHAGE) 1000 MG tablet Take 1,000 mg by mouth 2 (two) times daily with breakfast and lunch.   NON FORMULARY DIET:NAS Heart healthy diet    Tetrahydrozoline HCl (VISINE OP) Place 1 drop into both eyes daily as needed (dry eyes).   traMADol (ULTRAM) 50 MG tablet Take 50 mg by mouth every 6 (six) hours as needed. x14 days   No facility-administered encounter medications on file as of 04/02/2019.    Review of Systems   General she is not complaining of any fever or chills.  Skin does not complain of itching or rashes.  Head ears eyes nose mouth and throat does not complain of visual changes or or sore throat does have some history of dysphagia  Respiratory denies shortness of breath or cough.  Cardiac does not complain of chest pain or increasing edema.  GI is not complaining of abdominal discomfort nausea vomiting diarrhea or constipation.  GU no complaints of dysuria.  Musculoskeletal is positive for weakness right side does not complain of headache or dizziness.  And psychDoes not complain of being anxious or overtly depressed  Immunization History  Administered Date(s) Administered   Influenza, High Dose Seasonal PF 01/01/2015, 03/28/2017, 10/21/2017  Pneumococcal Conjugate-13 03/02/2016   Pneumococcal Polysaccharide-23 12/18/2006, 07/13/2017   Td 08/01/2006   Pertinent  Health Maintenance Due  Topic Date Due   FOOT EXAM  Never done   OPHTHALMOLOGY EXAM  Never done   MAMMOGRAM  Never done   COLONOSCOPY  Never done   URINE MICROALBUMIN  08/01/2007   DEXA SCAN  Never done   INFLUENZA VACCINE  08/17/2018   HEMOGLOBIN A1C  09/11/2019   PNA vac Low Risk Adult  Completed   No flowsheet data found. Functional Status Survey:    Vitals:   04/02/19 1613  BP: 120/70  Pulse: 70  Resp: 18  Temp: 98 F (36.7 C)  TempSrc: Oral  SpO2: 90%  Weight: 131 lb 9.6 oz (59.7 kg)  Height: 5\' 8"  (1.727 m)   Body mass index is 20.01 kg/m. Physical Exam   In general this is a well-nourished elderly female she does not appear to be in any distress.  Her skin is warm and dry.  Eyes visual acuity  appears grossly intact sclera and conjunctive are clear.  Oropharynx is clear.  Chest is clear to auscultation there is no labored breathing.  Heart is regular rate and rhythm without murmur gallop or rub she has trace left lower extremity edema.  Abdomen is soft nontender with positive bowel sounds.  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  Neurologic as noted above-she does have some  dysarthria and dysphagia status post CVA  Psych is pleasant appropriate  Labs reviewed:  April 02, 2019.  Sodium 138 potassium 5.3 BUN 56.4 creatinine 1.68.  WBC 6.5 hemoglobin 10.5 platelets 463.   Recent Labs    03/14/19 0341 03/18/19 0830 03/19/19 0722  NA 142 138 142  K 4.3 4.5 4.1  CL 107 100 102  CO2 27 22 24   GLUCOSE 120* 151* 129*  BUN 12 23 25*  CREATININE 0.97 1.02* 1.00  CALCIUM 8.8* 9.1 9.3  MG 1.6* 1.9  --    Recent Labs    03/13/19 1628  AST 21  ALT 15  ALKPHOS 74  BILITOT 0.3  PROT 6.4*  ALBUMIN 3.4*   Recent Labs    03/13/19 1628 03/13/19 1645 03/14/19 0341 03/18/19 0830 03/19/19 0722  WBC 8.5   < > 7.6 6.7 5.3  NEUTROABS 5.9  --   --  4.8 3.1  HGB 11.2*   < > 10.6* 11.1* 10.5*  HCT 35.1*   < > 33.2* 34.4* 33.8*  MCV 95.1   < > 95.1 95.3 97.7  PLT 354   < > 330 390 443*   < > = values in this interval not displayed.   Lab Results  Component Value Date   TSH 9.996 (H) 03/15/2019   Lab Results  Component Value Date   HGBA1C 11.1 (H) 03/14/2019   Lab Results  Component Value Date   CHOL 156 03/14/2019   HDL 36 (L) 03/14/2019   LDLCALC 92 03/14/2019   TRIG 139 03/14/2019   CHOLHDL 4.3 03/14/2019    Significant Diagnostic Results in last 30 days:  CT Code Stroke CTA Head W/WO contrast  Result Date: 03/13/2019 CLINICAL DATA:  Stroke code. EXAM: CT ANGIOGRAPHY HEAD AND NECK CT PERFUSION BRAIN TECHNIQUE: Multidetector CT  imaging of the head and neck was performed using the standard protocol during bolus administration of intravenous contrast. Multiplanar CT image reconstructions and MIPs were  obtained to evaluate the vascular anatomy. Carotid stenosis measurements (when applicable) are obtained utilizing NASCET criteria, using the distal internal carotid diameter as the denominator. Multiphase CT imaging of the brain was performed following IV bolus contrast injection. Subsequent parametric perfusion maps were calculated using RAPID software. CONTRAST:  Administered contrast not known at this time. COMPARISON:  Concurrently performed noncontrast head CT. FINDINGS: CTA NECK FINDINGS Aortic arch: There is an aberrant right subclavian artery. Minimal calcified plaque within the visualized aortic arch and proximal major branch vessels of the neck. No innominate or proximal subclavian artery stenosis. Right carotid system: CCA and ICA patent within the neck without stenosis. Minimal mixed plaque at the carotid bifurcation. Partially retropharyngeal course of the ICA. Left carotid system: CCA and ICA patent within the neck without stenosis. Minimal mixed plaque at the carotid bifurcation. Partially retropharyngeal course of the ICA. Vertebral arteries: The vertebral arteries are patent within the neck. The left vertebral artery is slightly dominant. Soft plaque at the origin of the right vertebral artery results in moderate/severe ostial stenosis. Calcified plaque at the origin of the left vertebral artery results in mild/moderate ostial stenosis. Skeleton: No acute bony abnormality. Cervical spondylosis with multilevel posterior disc osteophytes, uncovertebral and facet hypertrophy. A prominent posterior disc osteophyte complex at C5-C6 contributes to at least moderate spinal canal narrowing. Other neck: No cervical lymphadenopathy. 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 Upper chest: No consolidation within the imaged lung apices. Review of the MIP images confirms the above findings CTA HEAD FINDINGS Anterior circulation: The intracranial internal carotid arteries are patent with calcified plaque. Sites of up to moderate stenosis within the cavernous segments bilaterally. The M1 middle cerebral arteries are patent without significant stenosis. There is occlusion or near occlusion of a proximal to mid left M2 branch vessel (series 10, image 138) (series 8, images 74-67). The anterior cerebral arteries are patent without high-grade stenosis. No intracranial aneurysm is identified. Posterior circulation: The intracranial vertebral arteries are patent without significant stenosis. Calcified plaque within the V4 left vertebral artery results in mild stenosis. The basilar artery is patent without significant stenosis. The posterior cerebral arteries are patent without significant proximal stenosis. Posterior communicating arteries are poorly delineated and may be hypoplastic or absent bilaterally. Venous sinuses: Within limitations of contrast timing, no convincing thrombus. Anatomic variants: As described Review of the MIP images confirms the above findings Findings of a proximal to mid left M2 branch occlusion or near occlusion called by telephone at the time of interpretation on 03/13/2019 at 5:29 pm to provider MCNEILL [facility] , who verbally acknowledged these results. IMPRESSION: CTA neck: 1. The bilateral common and internal carotid arteries are patent within the neck without stenosis. Mild mixed plaque at the carotid bifurcations. 2. The vertebral arteries are patent within the neck bilaterally. Moderate/severe atherosclerotic narrowing at the origin of the right vertebral artery. Mild/moderate atherosclerotic narrowing at the origin of the left vertebral artery. 3. Incidentally noted aberrant right  subclavian artery. 4. 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 CTA head: 1. Occlusion or near occlusion of a proximal to mid left M2 MCA branch vessel as described. 2. 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 Sites of up to moderate stenosis within the cavernous segments bilaterally. 3. Mild atherosclerotic narrowing of the V4 left  vertebral artery. Electronically Signed   By: Jackey Loge DO   On: 03/13/2019 17:39   CT HEAD WO CONTRAST  Result Date: 03/19/2019 CLINICAL DATA:  Stroke patient on anticoagulation with worsening exam EXAM: CT HEAD WITHOUT CONTRAST TECHNIQUE: Contiguous axial images were obtained from the base of the skull through the vertex without intravenous contrast. COMPARISON:  03/13/2019 FINDINGS: Brain: There is new hypoattenuation in the left frontal lobe corresponding to area of infarction on MRI. Extent of involvement appears larger than area of diffusion restriction. There is no acute intracranial hemorrhage or significant mass effect. No additional new loss of gray-white differentiation. Ventricles and sulci are stable in size and configuration. Patchy and confluent areas of hypoattenuation in the supratentorial white matter likely reflect stable chronic microvascular ischemic changes. Vascular: No new finding. Skull: Calvarium is unremarkable. Sinuses/Orbits: No acute finding. Other: None. IMPRESSION: Evolving recent infarction within the left frontal lobe likely with mild progression in size compared to the recent MRI. No acute intracranial hemorrhage or significant mass effect. Electronically Signed   By: Guadlupe Spanish M.D.   On: 03/19/2019 14:46   CT Code Stroke CTA Neck W/WO contrast  Result Date: 03/13/2019 CLINICAL DATA:  Stroke code. EXAM: CT ANGIOGRAPHY HEAD AND NECK CT PERFUSION BRAIN TECHNIQUE: Multidetector CT imaging of the head and neck was performed using the  standard protocol during bolus administration of intravenous contrast. Multiplanar CT image reconstructions and MIPs were obtained to evaluate the vascular anatomy. Carotid stenosis measurements (when applicable) are obtained utilizing NASCET criteria, using the distal internal carotid diameter as the denominator. Multiphase CT imaging of the brain was performed following IV bolus contrast injection. Subsequent parametric perfusion maps were calculated using RAPID software. CONTRAST:  Administered contrast not known at this time. COMPARISON:  Concurrently performed noncontrast head CT. FINDINGS: CTA NECK FINDINGS Aortic arch: There is an aberrant right subclavian artery. Minimal calcified plaque within the visualized aortic arch and proximal major branch vessels of the neck. No innominate or proximal subclavian artery stenosis. Right carotid system: CCA and ICA patent within the neck without stenosis. Minimal mixed plaque at the carotid bifurcation. Partially retropharyngeal course of the ICA. Left carotid system: CCA and ICA patent within the neck without stenosis. Minimal mixed plaque at the carotid bifurcation. Partially retropharyngeal course of the ICA. Vertebral arteries: The vertebral arteries are patent within the neck. The left vertebral artery is slightly dominant. Soft plaque at the origin of the right vertebral artery results in moderate/severe ostial stenosis. Calcified plaque at the origin of the left vertebral artery results in mild/moderate ostial stenosis. Skeleton: No acute bony abnormality. Cervical spondylosis with multilevel posterior disc osteophytes, uncovertebral and facet hypertrophy. A prominent posterior disc osteophyte complex at C5-C6 contributes to at least moderate spinal canal narrowing. Other neck: No cervical lymphadenopathy. 15 mm mass within the midline neck inferior to the hyoid bone which is similar in density/enhancement as compared to the thyroid gland and likely reflects  ectopic thyroid tissue. Upper chest: No consolidation within the imaged lung apices. Review of the MIP images confirms the above findings CTA HEAD FINDINGS Anterior circulation: The intracranial internal carotid arteries are patent with calcified plaque. Sites of up to moderate stenosis within the cavernous segments bilaterally. The M1 middle cerebral arteries are patent without significant stenosis. There is occlusion or near occlusion of a proximal to mid left M2 branch vessel (series 10, image 138) (series 8, images 74-67). The anterior cerebral arteries are patent without high-grade stenosis. No intracranial aneurysm is identified.  Posterior circulation: The intracranial vertebral arteries are patent without significant stenosis. Calcified plaque within the V4 left vertebral artery results in mild stenosis. The basilar artery is patent without significant stenosis. The posterior cerebral arteries are patent without significant proximal stenosis. Posterior communicating arteries are poorly delineated and may be hypoplastic or absent bilaterally. Venous sinuses: Within limitations of contrast timing, no convincing thrombus. Anatomic variants: As described Review of the MIP images confirms the above findings Findings of a proximal to mid left M2 branch occlusion or near occlusion called by telephone at the time of interpretation on 03/13/2019 at 5:29 pm to provider MCNEILL [facility] , who verbally acknowledged these results. IMPRESSION: CTA neck: 1. The bilateral common and internal carotid arteries are patent within the neck without stenosis. Mild mixed plaque at the carotid bifurcations. 2. The vertebral arteries are patent within the neck bilaterally. Moderate/severe atherosclerotic narrowing at the origin of the right vertebral artery. Mild/moderate atherosclerotic narrowing at the origin of the left vertebral artery. 3. Incidentally noted aberrant right subclavian artery. 4. 15 mm soft tissue mass within the  midline neck just beneath the hyoid bone favored to reflect ectopic thyroid tissue. CTA head: 1. Occlusion or near occlusion of a proximal to mid left M2 MCA branch vessel as described. 2. 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 Sites of up to moderate stenosis within the cavernous segments bilaterally. 3. Mild atherosclerotic narrowing of the V4 left vertebral artery. Electronically Signed   By: Jackey Loge DO   On: 03/13/2019 17:39   MR BRAIN WO CONTRAST  Result Date: 03/13/2019 CLINICAL DATA:  Stroke, follow-up EXAM: MRI HEAD WITHOUT CONTRAST TECHNIQUE: Multiplanar, multiecho pulse sequences of the brain and surrounding structures were obtained without intravenous contrast. COMPARISON:  Correlation made with CT imaging earlier same day FINDINGS: Brain: There are small areas of restricted diffusion within the left frontal lobe including involvement of the corona radiata and precentral gyrus. Patchy and confluent areas of T2 hyperintensity in the supratentorial and pontine white matter are nonspecific but probably reflect moderate chronic microvascular ischemic changes. Prominence of the ventricles and sulci reflects generalized parenchymal volume loss. There is no intracranial hemorrhage. There is no intracranial mass, mass effect, or edema. There is no hydrocephalus or extra-axial fluid collection. Vascular: Major vessel flow voids at the skull base are preserved. Susceptibility along the distal left M2 MCA distribution likely corresponds to area of thrombus on CTA. Skull and upper cervical spine: Normal marrow signal is preserved. Sinuses/Orbits: Minor mucosal thickening. Bilateral lens replacements. Other: Sella is unremarkable.  Mastoid air cells are clear. IMPRESSION: Small acute infarcts within the left frontal lobe. Moderate chronic microvascular ischemic changes. Electronically Signed   By: Guadlupe Spanish M.D.   On: 03/13/2019 21:03   CT CEREBRAL PERFUSION W  CONTRAST  Result Date: 03/13/2019 CLINICAL DATA:  Neuro deficit, subacute. EXAM: CT PERFUSION BRAIN TECHNIQUE: Multiphase CT imaging of the brain was performed following IV bolus contrast injection. Subsequent parametric perfusion maps were calculated using RAPID software. CONTRAST:  13mL OMNIPAQUE IOHEXOL 350 MG/ML SOLN COMPARISON:  Noncontrast head CT and CT angiogram head/neck performed earlier the same day. FINDINGS: CT Brain Perfusion Findings: CBF (<30%) Volume: 39mL Perfusion (Tmax>6.0s) volume: 73mL in the left frontal lobe MCA territory (utilizing the Tmax greater than 4 seconds threshold, there is 21 mL of hypoperfusion in this region). Mismatch Volume: 61mL ASPECTS on noncontrast CT Head: 10 at 4:53 p.m. today. Infarct Core: 0 mL Infarction Location:None identified These results were communicated to Dr.  Kirkpatrick At 6:09 pmon 2/25/2021by text page via the [facility] messaging system. IMPRESSION: The perfusion software identifies a 6 mL region of critically hypoperfused parenchyma in the left frontal lobe MCA vascular territory utilizing the Tmax>6 seconds threshold. The perfusion software identifies no core infarct. Reported mismatch volume 6 mL. Of note, utilizing a Tmax > 4 seconds threshold, there is a 21 mL region of hypoperfusion in the left MCA territory. Electronically Signed   By: Jackey Loge DO   On: 03/13/2019 18:16   VAS [place] IVC/ILIAC (VENOUS ONLY)  Result Date: 03/15/2019 IVC/ILIAC STUDY Indications: DVT in left EIV, CFV, FV, Pop found 03/13/18 Risk Factors: Prior CVA.  Comparison Study: No prior IVC/Iliac study on file Performing Technologist: Sherren Kerns RVS  Examination Guidelines: A complete evaluation includes B-mode imaging, spectral Doppler, color Doppler, and power Doppler as needed of all accessible portions of each vessel. Bilateral testing is considered an integral part of a complete examination. Limited examinations for reoccurring indications may be performed as noted.   +-------------------+---------+-----------+---------+-----------+--------+          CIV         RT-Patent RT-Thrombus LT-Patent LT-Thrombus Comments  +-------------------+---------+-----------+---------+-----------+--------+  Common Iliac Mid     patent                                               +-------------------+---------+-----------+---------+-----------+--------+  Common Iliac Distal  patent                                               +-------------------+---------+-----------+---------+-----------+--------+    Summary: IVC/Iliac: There is no evidence of thrombus involving the left common iliac vein.  *See table(s) above for measurements and observations.  Electronically signed by Lemar Livings MD on 03/15/2019 at 3:58:29 PM.   Final    ECHOCARDIOGRAM COMPLETE  Result Date: 03/14/2019    ECHOCARDIOGRAM REPORT   Patient Name:   Emma [name] Date of Exam: 03/14/2019 Medical Rec #:  [id number]         Height:       68.0 in Accession #:    [id number]        Weight:       148.4 lb Date of Birth:  28-Jul-1945        BSA:          1.800 m Patient Age:    73 years          BP:           130/73 mmHg Patient Gender: F                 HR:           61 bpm. Exam Location:  Inpatient Procedure: 2D Echo Indications:    stroke 434.91  History:        Patient has no prior history of Echocardiogram examinations.                 Risk Factors:Hypertension and Dyslipidemia.  Sonographer:    Delcie Roch Referring Phys: [id number] ALEXANDER N RAINES IMPRESSIONS  1. Abnormal septal motion. Left ventricular ejection fraction, by estimation, is 50 to 55%. The left ventricle has low normal function. The left ventricle has no regional wall motion abnormalities.  Left ventricular diastolic parameters were normal.  2. Right ventricular systolic function is normal. The right ventricular size is normal.  3. Bubble study not performed.  4. The mitral valve is normal in structure and function. No evidence of mitral valve  regurgitation. No evidence of mitral stenosis.  5. The aortic valve is normal in structure and function. Aortic valve regurgitation is not visualized. No aortic stenosis is present.  6. The inferior vena cava is normal in size with greater than 50% respiratory variability, suggesting right atrial pressure of 3 mmHg. FINDINGS  Left Ventricle: Abnormal septal motion. Left ventricular ejection fraction, by estimation, is 50 to 55%. The left ventricle has low normal function. The left ventricle has no regional wall motion abnormalities. The left ventricular internal cavity size was normal in size. There is no left ventricular hypertrophy. Left ventricular diastolic parameters were normal. Right Ventricle: The right ventricular size is normal. No increase in right ventricular wall thickness. Right ventricular systolic function is normal. Left Atrium: Left atrial size was normal in size. Right Atrium: Right atrial size was normal in size. Pericardium: There is no evidence of pericardial effusion. Mitral Valve: The mitral valve is normal in structure and function. Normal mobility of the mitral valve leaflets. No evidence of mitral valve regurgitation. No evidence of mitral valve stenosis. Tricuspid Valve: The tricuspid valve is normal in structure. Tricuspid valve regurgitation is not demonstrated. No evidence of tricuspid stenosis. Aortic Valve: The aortic valve is normal in structure and function. Aortic valve regurgitation is not visualized. No aortic stenosis is present. Pulmonic Valve: The pulmonic valve was normal in structure. Pulmonic valve regurgitation is not visualized. No evidence of pulmonic stenosis. Aorta: The aortic root is normal in size and structure. Venous: The inferior vena cava is normal in size with greater than 50% respiratory variability, suggesting right atrial pressure of 3 mmHg. IAS/Shunts: No atrial level shunt detected by color flow Doppler.  LEFT VENTRICLE PLAX 2D LVIDd:         4.10 cm   Diastology LVIDs:         2.70 cm  LV e' lateral:   10.00 cm/s LV PW:         0.80 cm  LV E/e' lateral: 8.2 LV IVS:        0.70 cm  LV e' medial:    8.81 cm/s LVOT diam:     1.90 cm  LV E/e' medial:  9.3 LV SV:         50 LV SV Index:   28 LVOT Area:     2.84 cm  RIGHT VENTRICLE RV S prime:     12.60 cm/s TAPSE (M-mode): 1.8 cm LEFT ATRIUM             Index LA diam:        2.90 cm 1.61 cm/m LA Vol (A2C):   49.6 ml 27.55 ml/m LA Vol (A4C):   33.4 ml 18.55 ml/m LA Biplane Vol: 41.7 ml 23.16 ml/m  AORTIC VALVE LVOT Vmax:   95.30 cm/s LVOT Vmean:  58.900 cm/s LVOT VTI:    0.178 m  AORTA Ao Root diam: 2.70 cm MITRAL VALVE MV Area (PHT): 3.77 cm    SHUNTS MV Decel Time: 201 msec    Systemic VTI:  0.18 m MV E velocity: 82.00 cm/s  Systemic Diam: 1.90 cm MV A velocity: 78.10 cm/s MV E/A ratio:  1.05 Charlton Haws MD Electronically signed by Charlton Haws MD Signature Date/Time: 03/14/2019/9:08:55 AM  Final    CT HEAD CODE STROKE WO CONTRAST  Result Date: 03/13/2019 CLINICAL DATA:  Code stroke. Focal neuro deficit, greater than 6 hours, stroke suspected. Additional history provided: Last known normal 23:00, slurred speech, right-sided weakness. EXAM: CT HEAD WITHOUT CONTRAST TECHNIQUE: Contiguous axial images were obtained from the base of the skull through the vertex without intravenous contrast. COMPARISON:  No pertinent prior studies available for comparison. FINDINGS: Brain: There is no evidence of acute intracranial hemorrhage, intracranial mass, midline shift or extra-axial fluid collection. No demarcated cortical infarction is identified. Moderate/advanced patchy and confluent hypodensity within the cerebral white matter is nonspecific, but consistent with chronic small vessel ischemic disease. Moderate generalized parenchymal atrophy. Vascular: No hyperdense vessel.  Atherosclerotic calcifications. Skull: Normal. Negative for fracture or focal lesion. Sinuses/Orbits: Visualized orbits demonstrate no acute  abnormality. Small left sphenoid sinus mucous retention cyst. No significant mastoid effusion. These results were communicated to Dr. Amada Jupiter At 5:09 pmon 2/25/2021by text page via the [facility] messaging system. IMPRESSION: No CT evidence of acute intracranial abnormality. Moderate/advanced chronic small vessel ischemic disease. Moderate generalized parenchymal atrophy Electronically Signed   By: Jackey Loge DO   On: 03/13/2019 17:09   VAS [place] LOWER EXTREMITY VENOUS (DVT)  Result Date: 03/14/2019  Lower Venous DVTStudy Indications: Stroke.  Risk Factors: Surgery Right above knee amputation. Limitations: Poor ultrasound/tissue interface and body habitus. Comparison Study: No prior exam. Performing Technologist: Kennedy Bucker ARDMS, RVT  Examination Guidelines: A complete evaluation includes B-mode imaging, spectral Doppler, color Doppler, and power Doppler as needed of all accessible portions of each vessel. Bilateral testing is considered an integral part of a complete examination. Limited examinations for reoccurring indications may be performed as noted. The reflux portion of the exam is performed with the patient in reverse Trendelenburg.  +-------+---------------+---------+-----------+----------+--------------+  RIGHT   Compressibility Phasicity Spontaneity Properties Thrombus Aging  +-------+---------------+---------+-----------+----------+--------------+  CFV     Full            Yes       Yes                                    +-------+---------------+---------+-----------+----------+--------------+  SFJ                                                      Not visualized  +-------+---------------+---------+-----------+----------+--------------+  FV Prox Full                                                             +-------+---------------+---------+-----------+----------+--------------+ Poory visualized, possibly due to above knee amputation.   +---------+---------------+---------+-----------+----------+-------------------+  LEFT      Compressibility Phasicity Spontaneity Properties Thrombus Aging       +---------+---------------+---------+-----------+----------+-------------------+  CFV       Partial         Yes       Yes                    Acute                +---------+---------------+---------+-----------+----------+-------------------+  SFJ       Partial                                                               +---------+---------------+---------+-----------+----------+-------------------+  FV Prox   Partial                                                               +---------+---------------+---------+-----------+----------+-------------------+  FV Mid    Partial                                                               +---------+---------------+---------+-----------+----------+-------------------+  FV Distal Partial                                                               +---------+---------------+---------+-----------+----------+-------------------+  PFV       Partial                                                               +---------+---------------+---------+-----------+----------+-------------------+  POP       Partial         No        No                     continuous flow      +---------+---------------+---------+-----------+----------+-------------------+  PTV                                                        poorly visualized,                                                               no color flow        +---------+---------------+---------+-----------+----------+-------------------+  PERO                                                       poorly visualized,  no color flow        +---------+---------------+---------+-----------+----------+-------------------+  EIV       Partial         Yes       Yes                                          +---------+---------------+---------+-----------+----------+-------------------+  IVC                                                        Not visualized       +---------+---------------+---------+-----------+----------+-------------------+     Summary: RIGHT: - There is no evidence of deep vein thrombosis in the CFV, FV prox, and prof. Right above knee amputation.  LEFT: - Findings consistent with acute deep vein thrombosis involving the left common femoral vein, SF junction, left femoral vein, left proximal profunda vein, left popliteal vein, left posterior tibial veins, left peroneal veins, and EIV. - No cystic structure found in the popliteal fossa.  *See table(s) above for measurements and observations. Electronically signed by Lemar Livings MD on 03/14/2019 at 3:23:46 PM.    Final     Assessment/Plan  #1 history of renal insufficiency creatinine has gone up some with slightly increased potassium -I did discuss this with her she does not want an IV and nursing staff feels that she would not keep this in.  I have written orders for fluids 120 cc every 2 hours while awake-she will need expedient BMP on Friday, March 19.  Fluids will have to be strongly encouraged.--I did discuss this with Dr. Lyn Hollingshead via phone  I did state to patient if  that fluid intake is not adequate she may need an IV.  She expressed understanding and implied that she would try to drink more fluids  2.  Diabetes type 2 she is on Glucophage 1000 mg twice daily-this will have to be watched with increased creatinine-hopefully fluids will help --her blood sugars have been good largely in the  100s.  3.  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  4.  History of CVA she continues on Eliquis as well as a statin atorvastatin 40 mg a day-at this point continue supportive care and therapy.   [id number]

## 2019-04-04 DIAGNOSIS — I10 Essential (primary) hypertension: Secondary | ICD-10-CM | POA: Diagnosis not present

## 2019-04-04 DIAGNOSIS — D649 Anemia, unspecified: Secondary | ICD-10-CM | POA: Diagnosis not present

## 2019-04-05 ENCOUNTER — Encounter: Payer: Self-pay | Admitting: Internal Medicine

## 2019-04-07 ENCOUNTER — Encounter: Payer: Self-pay | Admitting: Internal Medicine

## 2019-04-07 ENCOUNTER — Non-Acute Institutional Stay (SKILLED_NURSING_FACILITY): Payer: Medicare HMO | Admitting: Internal Medicine

## 2019-04-07 ENCOUNTER — Other Ambulatory Visit: Payer: Self-pay | Admitting: *Deleted

## 2019-04-07 DIAGNOSIS — I739 Peripheral vascular disease, unspecified: Secondary | ICD-10-CM

## 2019-04-07 DIAGNOSIS — I82492 Acute embolism and thrombosis of other specified deep vein of left lower extremity: Secondary | ICD-10-CM | POA: Diagnosis not present

## 2019-04-07 DIAGNOSIS — I10 Essential (primary) hypertension: Secondary | ICD-10-CM | POA: Diagnosis not present

## 2019-04-07 DIAGNOSIS — E118 Type 2 diabetes mellitus with unspecified complications: Secondary | ICD-10-CM | POA: Diagnosis not present

## 2019-04-07 DIAGNOSIS — I63512 Cerebral infarction due to unspecified occlusion or stenosis of left middle cerebral artery: Secondary | ICD-10-CM | POA: Diagnosis not present

## 2019-04-07 DIAGNOSIS — D649 Anemia, unspecified: Secondary | ICD-10-CM | POA: Diagnosis not present

## 2019-04-07 DIAGNOSIS — E1142 Type 2 diabetes mellitus with diabetic polyneuropathy: Secondary | ICD-10-CM | POA: Diagnosis not present

## 2019-04-07 DIAGNOSIS — E038 Other specified hypothyroidism: Secondary | ICD-10-CM | POA: Diagnosis not present

## 2019-04-07 DIAGNOSIS — I779 Disorder of arteries and arterioles, unspecified: Secondary | ICD-10-CM

## 2019-04-07 NOTE — Progress Notes (Signed)
Location:    [facility] Room Number: 100/P Place of Service:  SNF (31)  Provider: Estill Batten  PCP: Margit Hanks, MD Patient Care Team: Margit Hanks, MD as PCP - General (Internal Medicine) Wanda Plump, MD  Extended Emergency Contact Information Primary Emergency Contact: Temple Pacini Mobile Phone: [phone] Relation: Son Preferred language: [place] Interpreter needed? No  Code Status: DNR Goals of care:  Advanced Directive information Advanced Directives 04/07/2019  Does Patient Have a Medical Advance Directive? Yes  Type of Advance Directive Out of facility DNR (pink MOST or yellow form)  Does patient want to make changes to medical advance directive? No - Patient declined  Would patient like information on creating a medical advance directive? -  Pre-existing out of facility DNR order (yellow form or pink MOST form) Yellow form placed in chart (order not valid for inpatient use)     No Known Allergies  Chief Complaint  Patient presents with  . Discharge Note    Discharge Visit    HPI:  74 y.o. female seen today for discharge from facility which is scheduled for Wednesday, March 24.  She will going home with her daughter.  Patient was here for short-term rehab after sustaining an acute left MCA stroke.  With right-sided weakness and some slurring.  Neurology recommended aspirin 325 mg a day and Lipitor 40 mg a day.  Her creatinine was somewhat elevated but improved with IV fluids.  Hospital course was further complicated by left lower extremity DVT and she was started on Eliquis which she remains on 5 mg twice daily.  Her other diagnoses include status post right AKA-hypertension-hyperlipidemia-hypothyroidism as well as type 2 diabetes.  Currently she is resting in bed comfortably she is looking forward to going home.  We did do update labs which showed her creatinine had risen up to 1.68-she pretty adamantly  did not want an IV-she apparently has been drinking her fluids better and creatinine has trended down to 1.37 on lab done today.  She says she will drink better when she goes home she really wants to go home.  In regards to diabetes this apparently has been stable CBGs appear to be mainly in the 100s she is on Glucophage at 1000 mg twice daily.  She also continues on Synthroid with a history of hypothyroidism this will warrant follow-up by primary care provider.  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  Currently she is lying in bed comfortably vital signs appear to be stable she does not really have any complaints-again we have encouraged her to drink her fluids and will need to be doing this at home and she feels she will be doing much better when she goes home.      Past Medical History:  Diagnosis Date  . Arthritis    Back and right shoulder  . Constipation   . Diabetes mellitus    Type II  . Diabetes mellitus without complication (HCC)   . Diabetic neuropathy (HCC)   . History of blood transfusion   . Hypercholesteremia   . Hypertension   . Hypothyroidism   . Pneumonia   . Thyroid disease     Past Surgical History:  Procedure Laterality Date  . ABDOMINAL AORTOGRAM N/A 03/25/2018   Procedure: ABDOMINAL AORTOGRAM;  Surgeon: Maeola Harman, MD;  Location: [facility] INVASIVE CV LAB;  Service: Cardiovascular;  Laterality:  N/A;  . ABDOMINAL HYSTERECTOMY    . AMPUTATION Right 03/29/2018   Procedure: AMPUTATION ABOVE KNEE;  Surgeon: Nada Libman, MD;  Location: [facility] OR;  Service: Vascular;  Laterality: Right;  . APPENDECTOMY    . BACK SURGERY    . BYPASS GRAFT FEMORAL-PERONEAL Right 03/26/2018   Procedure: BYPASS GRAFT [place];  Surgeon: Maeola Harman, MD;  Location: [facility] OR;  Service: Vascular;  Laterality: Right;  . EXPLORATORY LAPAROTOMY     Adhesions removed and appendix  .  FEMORAL-POPLITEAL BYPASS GRAFT Right 03/27/2018   Procedure: Right lower extremity angiogram, Right Femoral Peroneal artery bypass, Angioplasty Right Femoral Peroneal bypass, Thrombectomy of Femoral peroneal artery;  Surgeon: Maeola Harman, MD;  Location: [facility] OR;  Service: Vascular;  Laterality: Right;  . INTRAOPERATIVE ARTERIOGRAM Right 03/26/2018   Procedure: Intra Operative Arteriogram;  Surgeon: Maeola Harman, MD;  Location: [facility] OR;  Service: Vascular;  Laterality: Right;  . LOWER EXTREMITY ANGIOGRAPHY Bilateral 03/25/2018   Procedure: LOWER EXTREMITY ANGIOGRAPHY;  Surgeon: Maeola Harman, MD;  Location: [facility] INVASIVE CV LAB;  Service: Cardiovascular;  Laterality: Bilateral;  . LUMBAR LAMINECTOMY/DECOMPRESSION MICRODISCECTOMY N/A 09/09/2014   Procedure: L4-L5 DECOMPRESSION ;  Surgeon: Venita Lick, MD;  Location: [facility] OR;  Service: Orthopedics;  Laterality: N/A;  . PATCH ANGIOPLASTY Right 03/26/2018   Procedure: PATCH ANGIOPLASTY;  Surgeon: Maeola Harman, MD;  Location: [facility] OR;  Service: Vascular;  Laterality: Right;  . SHOULDER SURGERY    . TONSILLECTOMY    . TOTAL THYROIDECTOMY    . VEIN HARVEST Right 03/26/2018   Procedure: Vein Harvest of Right Leg Greater Saphenous Vein in SITU;  Surgeon: Maeola Harman, MD;  Location: [facility] OR;  Service: Vascular;  Laterality: Right;      reports that she has been smoking cigarettes. She has a 50.00 pack-year smoking history. She has never used smokeless tobacco. She reports current alcohol use. She reports that she does not use drugs. Social History   Socioeconomic History  . Marital status: Married    Spouse name: Not on file  . Number of children: Not on file  . Years of education: Not on file  . Highest education level: Not on file  Occupational History  . Not on file  Tobacco Use  . Smoking status: Current Every Day Smoker    Packs/day: 1.00    Years: 50.00    Pack years: 50.00    Types:  Cigarettes  . Smokeless tobacco: Never Used  Substance and Sexual Activity  . Alcohol use: Yes    Comment: occasional  . Drug use: Never  . Sexual activity: Not on file  Other Topics Concern  . Not on file  Social History Narrative   ** Merged History Encounter **      Current daily smoker.  Uses wheelchair for ambulation.  Lives in an apartment by herself - patient passed away in May 28, 2010. Has groceries delivered.  Independent in ADLs.    Social Determinants of Health   Financial Resource Strain:   . Difficulty of Paying Living Expenses:   Food Insecurity:   . Worried About Programme researcher, broadcasting/film/video in the Last Year:   . Barista in the Last Year:   Transportation Needs:   . Freight forwarder (Medical):   Marland Kitchen Lack of Transportation (Non-Medical):   Physical Activity:   . Days of Exercise per Week:   . Minutes of Exercise per Session:   Stress:   . Feeling of  Stress :   Social Connections:   . Frequency of Communication with Friends and Family:   . Frequency of Social Gatherings with Friends and Family:   . Attends Religious Services:   . Active Member of Clubs or Organizations:   . Attends Banker Meetings:   Marland Kitchen Marital Status:   Intimate Partner Violence:   . Fear of Current or Ex-Partner:   . Emotionally Abused:   Marland Kitchen Physically Abused:   . Sexually Abused:    Functional Status Survey:    No Known Allergies  Pertinent  Health Maintenance Due  Topic Date Due  . FOOT EXAM  Never done  . OPHTHALMOLOGY EXAM  Never done  . MAMMOGRAM  Never done  . COLONOSCOPY  Never done  . URINE MICROALBUMIN  08/01/2007  . DEXA SCAN  Never done  . INFLUENZA VACCINE  08/17/2018  . HEMOGLOBIN A1C  09/11/2019  . PNA vac Low Risk Adult  Completed    Medications: Allergies as of 04/07/2019   No Known Allergies     Medication List       Accurate as of April 07, 2019  3:41 PM. If you have any questions, ask your nurse or doctor.        acetaminophen 325 MG  tablet Commonly known as: TYLENOL Take 2 tablets (650 mg total) by mouth every 6 (six) hours as needed for mild pain or moderate pain.   apixaban 5 MG Tabs tablet Commonly known as: ELIQUIS Take 1 tablet (5 mg total) by mouth 2 (two) times daily.   atorvastatin 40 MG tablet Commonly known as: LIPITOR Take 1 tablet (40 mg total) by mouth daily at 6 PM.   benzonatate 100 MG capsule Commonly known as: TESSALON Take 1 capsule (100 mg total) by mouth 3 (three) times daily as needed for cough.   bisacodyl 10 MG suppository Commonly known as: DULCOLAX If not relieved by MOM, give 10 mg Bisacodyl suppositiory rectally X 1 dose in 24 hours as needed (Do not use constipation standing orders for residents with renal failure/CFR less than 30. Contact MD for orders) (Physician Order)   feeding supplement (PRO-STAT SUGAR FREE 64) Liqd Take 30 mLs by mouth daily. To aid in wound healing   Foltanx 3-35-2 MG Tabs Take 1 tablet by mouth daily.   gabapentin 600 MG tablet Commonly known as: NEURONTIN Take 1,200 mg by mouth 2 (two) times daily with breakfast and lunch.   Glucerna Liqd Take 237 mLs by mouth 3 (three) times daily between meals. Poor intake   levothyroxine 100 MCG tablet Commonly known as: SYNTHROID Take 100 mcg by mouth daily.   magnesium hydroxide 400 MG/5ML suspension Commonly known as: MILK OF MAGNESIA If no BM in 3 days, give 30 cc Milk of Magnesium p.o. x 1 dose in 24 hours as needed (Do not use standing constipation orders for residents with renal failure CFR less than 30. Contact MD for orders) (Physician Order)   metFORMIN 1000 MG tablet Commonly known as: GLUCOPHAGE Take 1,000 mg by mouth 2 (two) times daily with breakfast and lunch.   NON FORMULARY DIET:NAS Heart healthy diet   traMADol 50 MG tablet Commonly known as: ULTRAM Take 1 tablet (50 mg total) by mouth every 6 (six) hours as needed. x14 days   VISINE OP Place 1 drop into both eyes daily as needed (dry  eyes).       Review of Systems   General she not complaining of any fever or  chills.  Skin is not complaining of rashes or itching.  Head ears eyes nose mouth and throat is not complain of visual changes or sore throat.  Respiratory does not complain of being short of breath or having a cough.  Cardiac no complaints of chest pain or increasing edema.  GI is not complaining of abdominal pain nausea vomiting diarrhea constipation.  GU no complaints of dysuria.  Musculoskeletal is status post right AKA does not complain of joint pain at this time tramadol appears to be helping.  Neurologic does not complain of dizziness headache does have a history of neuropathy is on Neurontin this appears to be controlled currently.  a history of CVA with right-sided weakness  Psych she does not complain of being depressed or anxious does have somewhat of a flat affect says that she would do better when she goes home.    Vitals:   04/07/19 1535  BP: 115/75  Pulse: 88  Resp: 18  Temp: (!) 97.2 F (36.2 C)  TempSrc: Oral  SpO2: 90%  Weight: 133 lb 3.2 oz (60.4 kg)  Height:  (1.727 m)   Body mass index is 20.25 kg/m. Physical Exam   In general this is a fairly well-nourished elderly female no distress lying comfortably in bed.  Her skin is warm and dry.  Eyes visual acuity appears grossly intact sclera and conjunctive are clear.  Oropharynx is clear mucous membranes moist.  Chest is clear to auscultation there is no labored breathing.  Heart is regular rate and rhythm without murmur gallop or rub she does not have significant lower extremity edema.  Abdomen is soft nontender with positive bowel sounds.  Musculoskeletal does have a right AKA and right sided weakness status post CVA.  Moves left extremities at baseline.  Neurologic as noted above she does have some dysarthria status post CVA-again with right-sided weakness.  Psych she is pleasant  appropriate.    Labs reviewed:  April 07, 2019.  Sodium 143 potassium 4.8 BUN 55.5 creatinine 1.37.  April 04, 2019.  Sodium 139 potassium 5.3 BUN 55.8 creatinine 1.5.  04/02/2019.  Sodium 138 potassium 5.3 BUN 56.4 creatinine 1.68.  WBC 6.5 hemoglobin 10.5 platelets 463 Basic Metabolic Panel: Recent Labs    03/14/19 0341 03/18/19 0830 03/19/19 0722  NA 142 138 142  K 4.3 4.5 4.1  CL 107 100 102  CO2 GLUCOSE 120* 151* 129*  BUN 12 23 25*  CREATININE 0.97 1.02* 1.00  CALCIUM 8.8* 9.1 9.3  MG 1.6* 1.9  --    Liver Function Tests: Recent Labs    03/13/19 1628  AST 21  ALT 15  ALKPHOS 74  BILITOT 0.3  PROT 6.4*  ALBUMIN 3.4*   No results for input(s): LIPASE, AMYLASE in the last 8760 hours. No results for input(s): AMMONIA in the last 8760 hours. CBC: Recent Labs    03/13/19 1628 03/13/19 1645 03/14/19 0341 03/18/19 0830 03/19/19 0722  WBC 8.5   < > 7.6 6.7 5.3  NEUTROABS 5.9  --   --  4.8 3.1  HGB 11.2*   < > 10.6* 11.1* 10.5*  HCT 35.1*   < > 33.2* 34.4* 33.8*  MCV 95.1   < > 95.1 95.3 97.7  PLT 354   < > 330 390 443*   < > = values in this interval not displayed.   Cardiac Enzymes: No results for input(s): CKTOTAL, CKMB, CKMBINDEX, TROPONINI in the last 8760 hours. BNP: Invalid input(s):  POCBNP CBG: Recent Labs    03/20/19 0500 03/20/19 0747 03/20/19 1139  GLUCAP 116* 128* 191*    Procedures and Imaging Studies During Stay: CT Code Stroke CTA Head W/WO contrast  Result Date: 03/13/2019 CLINICAL DATA:  Stroke code. EXAM: CT ANGIOGRAPHY HEAD AND NECK CT PERFUSION BRAIN TECHNIQUE: Multidetector CT imaging of the head and neck was performed using the standard protocol during bolus administration of intravenous contrast. Multiplanar CT image reconstructions and MIPs were obtained to evaluate the vascular anatomy. Carotid stenosis measurements (when applicable) are obtained utilizing NASCET criteria, using the distal internal carotid  diameter as the denominator. Multiphase CT imaging of the brain was performed following IV bolus contrast injection. Subsequent parametric perfusion maps were calculated using RAPID software. CONTRAST:  Administered contrast not known at this time. COMPARISON:  Concurrently performed noncontrast head CT. FINDINGS: CTA NECK FINDINGS Aortic arch: There is an aberrant right subclavian artery. Minimal calcified plaque within the visualized aortic arch and proximal major branch vessels of the neck. No innominate or proximal subclavian artery stenosis. Right carotid system: CCA and ICA patent within the neck without stenosis. Minimal mixed plaque at the carotid bifurcation. Partially retropharyngeal course of the ICA. Left carotid system: CCA and ICA patent within the neck without stenosis. Minimal mixed plaque at the carotid bifurcation. Partially retropharyngeal course of the ICA. Vertebral arteries: The vertebral arteries are patent within the neck. The left vertebral artery is slightly dominant. Soft plaque at the origin of the right vertebral artery results in moderate/severe ostial stenosis. Calcified plaque at the origin of the left vertebral artery results in mild/moderate ostial stenosis. Skeleton: No acute bony abnormality. Cervical spondylosis with multilevel posterior disc osteophytes, uncovertebral and facet hypertrophy. A prominent posterior disc osteophyte complex at C5-C6 contributes to at least moderate spinal canal narrowing. Other neck: No cervical lymphadenopathy. 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 Upper chest: No consolidation within the imaged lung apices. Review of the MIP images confirms the above findings CTA HEAD FINDINGS Anterior circulation: The intracranial internal carotid arteries are patent with calcified plaque. Sites of up to moderate stenosis  within the cavernous segments bilaterally. The M1 middle cerebral arteries are patent without significant stenosis. There is occlusion or near occlusion of a proximal to mid left M2 branch vessel (series 10, image 138) (series 8, images 74-67). The anterior cerebral arteries are patent without high-grade stenosis. No intracranial aneurysm is identified. Posterior circulation: The intracranial vertebral arteries are patent without significant stenosis. Calcified plaque within the V4 left vertebral artery results in mild stenosis. The basilar artery is patent without significant stenosis. The posterior cerebral arteries are patent without significant proximal stenosis. Posterior communicating arteries are poorly delineated and may be hypoplastic or absent bilaterally. Venous sinuses: Within limitations of contrast timing, no convincing thrombus. Anatomic variants: As described Review of the MIP images confirms the above findings Findings of a proximal to mid left M2 branch occlusion or near occlusion called by telephone at the time of interpretation on 03/13/2019 at 5:29 pm to provider MCNEILL [facility] , who verbally acknowledged these results. IMPRESSION: CTA neck: 1. The bilateral common and internal carotid arteries are patent within the neck without stenosis. Mild mixed plaque at the carotid bifurcations. 2. The vertebral arteries are patent within the neck bilaterally. Moderate/severe atherosclerotic narrowing at the origin of the right vertebral artery. Mild/moderate atherosclerotic narrowing at the origin  of the left vertebral artery. 3. Incidentally noted aberrant right subclavian artery. 4. 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 CTA head: 1. Occlusion or near occlusion of a proximal to mid left M2 MCA branch vessel as described. 2. 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 Sites of up to moderate  stenosis within the cavernous segments bilaterally. 3. Mild atherosclerotic narrowing of the V4 left vertebral artery. Electronically Signed   By: Jackey Loge DO   On: 03/13/2019 17:39   CT HEAD WO CONTRAST  Result Date: 03/19/2019 CLINICAL DATA:  Stroke patient on anticoagulation with worsening exam EXAM: CT HEAD WITHOUT CONTRAST TECHNIQUE: Contiguous axial images were obtained from the base of the skull through the vertex without intravenous contrast. COMPARISON:  03/13/2019 FINDINGS: Brain: There is new hypoattenuation in the left frontal lobe corresponding to area of infarction on MRI. Extent of involvement appears larger than area of diffusion restriction. There is no acute intracranial hemorrhage or significant mass effect. No additional new loss of gray-white differentiation. Ventricles and sulci are stable in size and configuration. Patchy and confluent areas of hypoattenuation in the supratentorial white matter likely reflect stable chronic microvascular ischemic changes. Vascular: No new finding. Skull: Calvarium is unremarkable. Sinuses/Orbits: No acute finding. Other: None. IMPRESSION: Evolving recent infarction within the left frontal lobe likely with mild progression in size compared to the recent MRI. No acute intracranial hemorrhage or significant mass effect. Electronically Signed   By: Guadlupe Spanish M.D.   On: 03/19/2019 14:46   CT Code Stroke CTA Neck W/WO contrast  Result Date: 03/13/2019 CLINICAL DATA:  Stroke code. EXAM: CT ANGIOGRAPHY HEAD AND NECK CT PERFUSION BRAIN TECHNIQUE: Multidetector CT imaging of the head and neck was performed using the standard protocol during bolus administration of intravenous contrast. Multiplanar CT image reconstructions and MIPs were obtained to evaluate the vascular anatomy. Carotid stenosis measurements (when applicable) are obtained utilizing NASCET criteria, using the distal internal carotid diameter as the denominator. Multiphase CT imaging of the  brain was performed following IV bolus contrast injection. Subsequent parametric perfusion maps were calculated using RAPID software. CONTRAST:  Administered contrast not known at this time. COMPARISON:  Concurrently performed noncontrast head CT. FINDINGS: CTA NECK FINDINGS Aortic arch: There is an aberrant right subclavian artery. Minimal calcified plaque within the visualized aortic arch and proximal major branch vessels of the neck. No innominate or proximal subclavian artery stenosis. Right carotid system: CCA and ICA patent within the neck without stenosis. Minimal mixed plaque at the carotid bifurcation. Partially retropharyngeal course of the ICA. Left carotid system: CCA and ICA patent within the neck without stenosis. Minimal mixed plaque at the carotid bifurcation. Partially retropharyngeal course of the ICA. Vertebral arteries: The vertebral arteries are patent within the neck. The left vertebral artery is slightly dominant. Soft plaque at the origin of the right vertebral artery results in moderate/severe ostial stenosis. Calcified plaque at the origin of the left vertebral artery results in mild/moderate ostial stenosis. Skeleton: No acute bony abnormality. Cervical spondylosis with multilevel posterior disc osteophytes, uncovertebral and facet hypertrophy. A prominent posterior disc osteophyte complex at C5-C6 contributes to at least moderate spinal canal narrowing. Other neck: No cervical lymphadenopathy. 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 Upper chest: No consolidation within the imaged lung apices. Review of  the MIP images confirms the above findings CTA HEAD FINDINGS Anterior circulation: The intracranial internal carotid arteries are patent with calcified plaque. Sites of up to moderate stenosis within the cavernous segments bilaterally. The M1 middle  cerebral arteries are patent without significant stenosis. There is occlusion or near occlusion of a proximal to mid left M2 branch vessel (series 10, image 138) (series 8, images 74-67). The anterior cerebral arteries are patent without high-grade stenosis. No intracranial aneurysm is identified. Posterior circulation: The intracranial vertebral arteries are patent without significant stenosis. Calcified plaque within the V4 left vertebral artery results in mild stenosis. The basilar artery is patent without significant stenosis. The posterior cerebral arteries are patent without significant proximal stenosis. Posterior communicating arteries are poorly delineated and may be hypoplastic or absent bilaterally. Venous sinuses: Within limitations of contrast timing, no convincing thrombus. Anatomic variants: As described Review of the MIP images confirms the above findings Findings of a proximal to mid left M2 branch occlusion or near occlusion called by telephone at the time of interpretation on 03/13/2019 at 5:29 pm to provider MCNEILL [facility] , who verbally acknowledged these results. IMPRESSION: CTA neck: 1. The bilateral common and internal carotid arteries are patent within the neck without stenosis. Mild mixed plaque at the carotid bifurcations. 2. The vertebral arteries are patent within the neck bilaterally. Moderate/severe atherosclerotic narrowing at the origin of the right vertebral artery. Mild/moderate atherosclerotic narrowing at the origin of the left vertebral artery. 3. Incidentally noted aberrant right subclavian artery. 4. 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 CTA head: 1. Occlusion or near occlusion of a proximal to mid left M2 MCA branch vessel as described. 2. 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 Sites of up to moderate stenosis within the cavernous segments bilaterally. 3. Mild  atherosclerotic narrowing of the V4 left vertebral artery. Electronically Signed   By: Jackey Loge DO   On: 03/13/2019 17:39   MR BRAIN WO CONTRAST  Result Date: 03/13/2019 CLINICAL DATA:  Stroke, follow-up EXAM: MRI HEAD WITHOUT CONTRAST TECHNIQUE: Multiplanar, multiecho pulse sequences of the brain and surrounding structures were obtained without intravenous contrast. COMPARISON:  Correlation made with CT imaging earlier same day FINDINGS: Brain: There are small areas of restricted diffusion within the left frontal lobe including involvement of the corona radiata and precentral gyrus. Patchy and confluent areas of T2 hyperintensity in the supratentorial and pontine white matter are nonspecific but probably reflect moderate chronic microvascular ischemic changes. Prominence of the ventricles and sulci reflects generalized parenchymal volume loss. There is no intracranial hemorrhage. There is no intracranial mass, mass effect, or edema. There is no hydrocephalus or extra-axial fluid collection. Vascular: Major vessel flow voids at the skull base are preserved. Susceptibility along the distal left M2 MCA distribution likely corresponds to area of thrombus on CTA. Skull and upper cervical spine: Normal marrow signal is preserved. Sinuses/Orbits: Minor mucosal thickening. Bilateral lens replacements. Other: Sella is unremarkable.  Mastoid air cells are clear. IMPRESSION: Small acute infarcts within the left frontal lobe. Moderate chronic microvascular ischemic changes. Electronically Signed   By: Guadlupe Spanish M.D.   On: 03/13/2019 21:03   CT CEREBRAL PERFUSION W CONTRAST  Result Date: 03/13/2019 CLINICAL DATA:  Neuro deficit, subacute. EXAM: CT PERFUSION BRAIN TECHNIQUE: Multiphase CT imaging of the brain was performed following IV bolus contrast injection. Subsequent parametric perfusion maps were calculated using RAPID software. CONTRAST:  50mL OMNIPAQUE IOHEXOL  350 MG/ML SOLN COMPARISON:  Noncontrast head  CT and CT angiogram head/neck performed earlier the same day. FINDINGS: CT Brain Perfusion Findings: CBF (<30%) Volume: 19mL Perfusion (Tmax>6.0s) volume: 43mL in the left frontal lobe MCA territory (utilizing the Tmax greater than 4 seconds threshold, there is 21 mL of hypoperfusion in this region). Mismatch Volume: 58mL ASPECTS on noncontrast CT Head: 10 at 4:53 p.m. today. Infarct Core: 0 mL Infarction Location:None identified These results were communicated to Dr. Amada Jupiter At 6:09 pmon 2/25/2021by text page via the [facility] messaging system. IMPRESSION: The perfusion software identifies a 6 mL region of critically hypoperfused parenchyma in the left frontal lobe MCA vascular territory utilizing the Tmax>6 seconds threshold. The perfusion software identifies no core infarct. Reported mismatch volume 6 mL. Of note, utilizing a Tmax > 4 seconds threshold, there is a 21 mL region of hypoperfusion in the left MCA territory. Electronically Signed   By: Jackey Loge DO   On: 03/13/2019 18:16   VAS [place] IVC/ILIAC (VENOUS ONLY)  Result Date: 03/15/2019 IVC/ILIAC STUDY Indications: DVT in left EIV, CFV, FV, Pop found 03/13/18 Risk Factors: Prior CVA.  Comparison Study: No prior IVC/Iliac study on file Performing Technologist: Sherren Kerns RVS  Examination Guidelines: A complete evaluation includes B-mode imaging, spectral Doppler, color Doppler, and power Doppler as needed of all accessible portions of each vessel. Bilateral testing is considered an integral part of a complete examination. Limited examinations for reoccurring indications may be performed as noted.  +-------------------+---------+-----------+---------+-----------+--------+         CIV        RT-PatentRT-ThrombusLT-PatentLT-ThrombusComments +-------------------+---------+-----------+---------+-----------+--------+ Common Iliac Mid    patent                                           +-------------------+---------+-----------+---------+-----------+--------+ Common Iliac Distal patent                                          +-------------------+---------+-----------+---------+-----------+--------+    Summary: IVC/Iliac: There is no evidence of thrombus involving the left common iliac vein.  *See table(s) above for measurements and observations.  Electronically signed by Lemar Livings MD on 03/15/2019 at 3:58:29 PM.   Final    ECHOCARDIOGRAM COMPLETE  Result Date: 03/14/2019    ECHOCARDIOGRAM REPORT   Patient Name:   KHRYSTINA BONNES Date of Exam: 03/14/2019 Medical Rec #:  [id number]         Height:       68.0 in Accession #:    [id number]        Weight:       148.4 lb Date of Birth:  Apr 24, 1945        BSA:          1.800 m Patient Age:    73 years          BP:           130/73 mmHg Patient Gender: F                 HR:           61 bpm. Exam Location:  Inpatient Procedure: 2D Echo Indications:    stroke 434.91  History:        Patient has no prior history of Echocardiogram examinations.  Risk Factors:Hypertension and Dyslipidemia.  Sonographer:    Delcie Roch Referring Phys: [id number] ALEXANDER N RAINES IMPRESSIONS  1. Abnormal septal motion. Left ventricular ejection fraction, by estimation, is 50 to 55%. The left ventricle has low normal function. The left ventricle has no regional wall motion abnormalities. Left ventricular diastolic parameters were normal.  2. Right ventricular systolic function is normal. The right ventricular size is normal.  3. Bubble study not performed.  4. The mitral valve is normal in structure and function. No evidence of mitral valve regurgitation. No evidence of mitral stenosis.  5. The aortic valve is normal in structure and function. Aortic valve regurgitation is not visualized. No aortic stenosis is present.  6. The inferior vena cava is normal in size with greater than 50% respiratory variability, suggesting right atrial pressure of  3 mmHg. FINDINGS  Left Ventricle: Abnormal septal motion. Left ventricular ejection fraction, by estimation, is 50 to 55%. The left ventricle has low normal function. The left ventricle has no regional wall motion abnormalities. The left ventricular internal cavity size was normal in size. There is no left ventricular hypertrophy. Left ventricular diastolic parameters were normal. Right Ventricle: The right ventricular size is normal. No increase in right ventricular wall thickness. Right ventricular systolic function is normal. Left Atrium: Left atrial size was normal in size. Right Atrium: Right atrial size was normal in size. Pericardium: There is no evidence of pericardial effusion. Mitral Valve: The mitral valve is normal in structure and function. Normal mobility of the mitral valve leaflets. No evidence of mitral valve regurgitation. No evidence of mitral valve stenosis. Tricuspid Valve: The tricuspid valve is normal in structure. Tricuspid valve regurgitation is not demonstrated. No evidence of tricuspid stenosis. Aortic Valve: The aortic valve is normal in structure and function. Aortic valve regurgitation is not visualized. No aortic stenosis is present. Pulmonic Valve: The pulmonic valve was normal in structure. Pulmonic valve regurgitation is not visualized. No evidence of pulmonic stenosis. Aorta: The aortic root is normal in size and structure. Venous: The inferior vena cava is normal in size with greater than 50% respiratory variability, suggesting right atrial pressure of 3 mmHg. IAS/Shunts: No atrial level shunt detected by color flow Doppler.  LEFT VENTRICLE PLAX 2D LVIDd:         4.10 cm  Diastology LVIDs:         2.70 cm  LV e' lateral:   10.00 cm/s LV PW:         0.80 cm  LV E/e' lateral: 8.2 LV IVS:        0.70 cm  LV e' medial:    8.81 cm/s LVOT diam:     1.90 cm  LV E/e' medial:  9.3 LV SV:         50 LV SV Index:   28 LVOT Area:     2.84 cm  RIGHT VENTRICLE RV S prime:     12.60 cm/s TAPSE  (M-mode): 1.8 cm LEFT ATRIUM             Index LA diam:        2.90 cm 1.61 cm/m LA Vol (A2C):   49.6 ml 27.55 ml/m LA Vol (A4C):   33.4 ml 18.55 ml/m LA Biplane Vol: 41.7 ml 23.16 ml/m  AORTIC VALVE LVOT Vmax:   95.30 cm/s LVOT Vmean:  58.900 cm/s LVOT VTI:    0.178 m  AORTA Ao Root diam: 2.70 cm MITRAL VALVE MV Area (PHT): 3.77 cm  SHUNTS MV Decel Time: 201 msec    Systemic VTI:  0.18 m MV E velocity: 82.00 cm/s  Systemic Diam: 1.90 cm MV A velocity: 78.10 cm/s MV E/A ratio:  1.05 Charlton HawsPeter Nishan MD Electronically signed by Charlton HawsPeter Nishan MD Signature Date/Time: 03/14/2019/9:08:55 AM    Final    CT HEAD CODE STROKE WO CONTRAST  Result Date: 03/13/2019 CLINICAL DATA:  Code stroke. Focal neuro deficit, greater than 6 hours, stroke suspected. Additional history provided: Last known normal 23:00, slurred speech, right-sided weakness. EXAM: CT HEAD WITHOUT CONTRAST TECHNIQUE: Contiguous axial images were obtained from the base of the skull through the vertex without intravenous contrast. COMPARISON:  No pertinent prior studies available for comparison. FINDINGS: Brain: There is no evidence of acute intracranial hemorrhage, intracranial mass, midline shift or extra-axial fluid collection. No demarcated cortical infarction is identified. Moderate/advanced patchy and confluent hypodensity within the cerebral white matter is nonspecific, but consistent with chronic small vessel ischemic disease. Moderate generalized parenchymal atrophy. Vascular: No hyperdense vessel.  Atherosclerotic calcifications. Skull: Normal. Negative for fracture or focal lesion. Sinuses/Orbits: Visualized orbits demonstrate no acute abnormality. Small left sphenoid sinus mucous retention cyst. No significant mastoid effusion. These results were communicated to Dr. Amada JupiterKirkpatrick At 5:09 pmon 2/25/2021by text page [facility]. IMPRESSION: No CT evidence of acute intracranial abnormality. Moderate/advanced chronic small vessel  ischemic disease. Moderate generalized parenchymal atrophy Electronically Signed   By: Jackey LogeKyle  Golden DO   On: 03/13/2019 17:09   VAS US LOWER EXTREMITY VENOUS (DVT)  Result Date: 03/14/2019  Lower Venous DVTStudy Indications: Stroke.  Risk Factors: Surgery Right above knee amputation. Limitations: Poor ultrasound/tissue interface and body habitus. Comparison Study: No prior exam. Performing Technologist: Kennedy BuckerKristy Siebrecht ARDMS, RVT  Examination Guidelines: A complete evaluation includes B-mode imaging, spectral Doppler, color Doppler, and power Doppler as needed of all accessible portions of each vessel. Bilateral testing is considered an integral part of a complete examination. Limited examinations for reoccurring indications may be performed as noted. The reflux portion of the exam is performed with the patient in reverse Trendelenburg.  +-------+---------------+---------+-----------+----------+--------------+ RIGHT  CompressibilityPhasicitySpontaneityPropertiesThrombus Aging +-------+---------------+---------+-----------+----------+--------------+ CFV    Full           Yes      Yes                                 +-------+---------------+---------+-----------+----------+--------------+ SFJ                                                 Not visualized +-------+---------------+---------+-----------+----------+--------------+ FV ProxFull                                                        +-------+---------------+---------+-----------+----------+--------------+ Poory visualized, possibly due to above knee amputation.  +---------+---------------+---------+-----------+----------+-------------------+ LEFT     CompressibilityPhasicitySpontaneityPropertiesThrombus Aging      +---------+---------------+---------+-----------+----------+-------------------+ CFV      Partial        Yes      Yes                  Acute                +---------+---------------+---------+-----------+----------+-------------------+  SFJ      Partial                                                          +---------+---------------+---------+-----------+----------+-------------------+ FV Prox  Partial                                                          +---------+---------------+---------+-----------+----------+-------------------+ FV Mid   Partial                                                          +---------+---------------+---------+-----------+----------+-------------------+ FV DistalPartial                                                          +---------+---------------+---------+-----------+----------+-------------------+ PFV      Partial                                                          +---------+---------------+---------+-----------+----------+-------------------+ POP      Partial        No       No                   continuous flow     +---------+---------------+---------+-----------+----------+-------------------+ PTV                                                   poorly visualized,                                                        no color flow       +---------+---------------+---------+-----------+----------+-------------------+ PERO                                                  poorly visualized,                                                        no color flow       +---------+---------------+---------+-----------+----------+-------------------+ EIV  Partial        Yes      Yes                                      +---------+---------------+---------+-----------+----------+-------------------+ IVC                                                   Not visualized      +---------+---------------+---------+-----------+----------+-------------------+     Summary: RIGHT: - There is no evidence of deep vein thrombosis in the CFV, FV  prox, and prof. Right above knee amputation.  LEFT: - Findings consistent with acute deep vein thrombosis involving the left common femoral vein, SF junction, left femoral vein, left proximal profunda vein, left popliteal vein, left posterior tibial veins, left peroneal veins, and EIV. - No cystic structure found in the popliteal fossa.  *See table(s) above for measurements and observations. Electronically signed by Lemar Livings MD on 03/14/2019 at 3:23:46 PM.    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  Lipid panel showed an LDL of 92 HDL 36.  Hemoglobin A1c was 11.1.  Dysarthria has improved somewhat.  She was started on Lipitor 40 mg a day and Eliquis 5 mg twice daily.  She will need continued PT and OT with home health support.  Secondary to verylimited mobility status post CVA with right-sided weakness as well as previous history of right AKA she will need a semielevated hospital bed and a bedside commode.  2.  History of left lower extremity DVT again she is on Eliquis 5 mg twice daily.  3.  History of type 2 diabetes she is on Glucophage 1000 mg twice daily this will need follow-up by primary care provider it appears her CBGs have been in the high 100s mainly here.  Most recently 164-109.  4.  History of renal insufficiency-her creatinine did rise here up to 1.68 it is trending down with strong fluid encouragement-currently 1.37 she says she will drink better when she goes home-this will warrant follow-up by primary care provider in meantime continue to encourage fluids which I did spoke to her about today.  5.  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  6.  History of diabetic neuropathy she is on Neurontin 1200 mg twice daily this appears to help.  7.  History of dry eyes she does have Visine as needed.  8.   History of pain she does have orders for tramadol this is scheduled as needed for 6 additional days after discharge we will give her a limited supply of 15 tablets with refills by primary care provider if needed  [id number]

## 2019-04-08 ENCOUNTER — Encounter: Payer: Self-pay | Admitting: Internal Medicine

## 2019-04-08 ENCOUNTER — Ambulatory Visit (HOSPITAL_COMMUNITY): Admission: RE | Admit: 2019-04-08 | Payer: Medicare HMO | Source: Ambulatory Visit

## 2019-04-08 ENCOUNTER — Ambulatory Visit: Payer: Medicare HMO

## 2019-04-08 ENCOUNTER — Encounter (HOSPITAL_COMMUNITY): Payer: Medicare HMO

## 2019-04-08 MED ORDER — APIXABAN 5 MG PO TABS: 5.0000 mg | ORAL_TABLET | Freq: Two times a day (BID) | ORAL | 0 refills | Status: DC

## 2019-04-08 MED ORDER — METFORMIN HCL 1000 MG PO TABS: 1000.0000 mg | ORAL_TABLET | Freq: Two times a day (BID) | ORAL | 0 refills | Status: DC

## 2019-04-08 MED ORDER — LEVOTHYROXINE SODIUM 100 MCG PO TABS: 100.0000 ug | ORAL_TABLET | Freq: Every day | ORAL | 0 refills | Status: DC

## 2019-04-08 MED ORDER — BENZONATATE 100 MG PO CAPS: 100.0000 mg | ORAL_CAPSULE | Freq: Three times a day (TID) | ORAL | 0 refills | Status: DC | PRN

## 2019-04-08 MED ORDER — TRAMADOL HCL 50 MG PO TABS: 50.0000 mg | ORAL_TABLET | Freq: Four times a day (QID) | ORAL | 0 refills | Status: DC | PRN

## 2019-04-08 MED ORDER — TETRAHYDROZOLINE HCL 0.05 % OP SOLN: 1.0000 [drp] | Freq: Every day | OPHTHALMIC | 0 refills | Status: DC | PRN

## 2019-04-08 MED ORDER — FOLTANX 3-35-2 MG PO TABS: 1.0000 | ORAL_TABLET | Freq: Every day | ORAL | 0 refills | Status: DC

## 2019-04-08 MED ORDER — ATORVASTATIN CALCIUM 40 MG PO TABS: 40.0000 mg | ORAL_TABLET | Freq: Every day | ORAL | 0 refills | Status: DC

## 2019-04-08 MED ORDER — GABAPENTIN 600 MG PO TABS: 1200.0000 mg | ORAL_TABLET | Freq: Two times a day (BID) | ORAL | 0 refills | Status: DC

## 2019-04-10 DIAGNOSIS — L89152 Pressure ulcer of sacral region, stage 2: Secondary | ICD-10-CM | POA: Diagnosis not present

## 2019-04-10 DIAGNOSIS — I82402 Acute embolism and thrombosis of unspecified deep veins of left lower extremity: Secondary | ICD-10-CM | POA: Diagnosis not present

## 2019-04-10 DIAGNOSIS — Z89611 Acquired absence of right leg above knee: Secondary | ICD-10-CM | POA: Diagnosis not present

## 2019-04-10 DIAGNOSIS — I69391 Dysphagia following cerebral infarction: Secondary | ICD-10-CM | POA: Diagnosis not present

## 2019-04-10 DIAGNOSIS — I69351 Hemiplegia and hemiparesis following cerebral infarction affecting right dominant side: Secondary | ICD-10-CM | POA: Diagnosis not present

## 2019-04-11 DIAGNOSIS — L89152 Pressure ulcer of sacral region, stage 2: Secondary | ICD-10-CM | POA: Diagnosis not present

## 2019-04-11 DIAGNOSIS — R1312 Dysphagia, oropharyngeal phase: Secondary | ICD-10-CM | POA: Diagnosis not present

## 2019-04-11 DIAGNOSIS — I69320 Aphasia following cerebral infarction: Secondary | ICD-10-CM | POA: Diagnosis not present

## 2019-04-11 DIAGNOSIS — E1142 Type 2 diabetes mellitus with diabetic polyneuropathy: Secondary | ICD-10-CM | POA: Diagnosis not present

## 2019-04-11 DIAGNOSIS — I69351 Hemiplegia and hemiparesis following cerebral infarction affecting right dominant side: Secondary | ICD-10-CM | POA: Diagnosis not present

## 2019-04-11 DIAGNOSIS — E1151 Type 2 diabetes mellitus with diabetic peripheral angiopathy without gangrene: Secondary | ICD-10-CM | POA: Diagnosis not present

## 2019-04-11 DIAGNOSIS — I10 Essential (primary) hypertension: Secondary | ICD-10-CM | POA: Diagnosis not present

## 2019-04-11 DIAGNOSIS — I69322 Dysarthria following cerebral infarction: Secondary | ICD-10-CM | POA: Diagnosis not present

## 2019-04-11 DIAGNOSIS — I69391 Dysphagia following cerebral infarction: Secondary | ICD-10-CM | POA: Diagnosis not present

## 2019-04-12 DIAGNOSIS — I69351 Hemiplegia and hemiparesis following cerebral infarction affecting right dominant side: Secondary | ICD-10-CM | POA: Diagnosis not present

## 2019-04-12 DIAGNOSIS — E1142 Type 2 diabetes mellitus with diabetic polyneuropathy: Secondary | ICD-10-CM | POA: Diagnosis not present

## 2019-04-12 DIAGNOSIS — I69391 Dysphagia following cerebral infarction: Secondary | ICD-10-CM | POA: Diagnosis not present

## 2019-04-12 DIAGNOSIS — I10 Essential (primary) hypertension: Secondary | ICD-10-CM | POA: Diagnosis not present

## 2019-04-12 DIAGNOSIS — R1312 Dysphagia, oropharyngeal phase: Secondary | ICD-10-CM | POA: Diagnosis not present

## 2019-04-12 DIAGNOSIS — I69320 Aphasia following cerebral infarction: Secondary | ICD-10-CM | POA: Diagnosis not present

## 2019-04-12 DIAGNOSIS — E1151 Type 2 diabetes mellitus with diabetic peripheral angiopathy without gangrene: Secondary | ICD-10-CM | POA: Diagnosis not present

## 2019-04-12 DIAGNOSIS — I69322 Dysarthria following cerebral infarction: Secondary | ICD-10-CM | POA: Diagnosis not present

## 2019-04-12 DIAGNOSIS — L89152 Pressure ulcer of sacral region, stage 2: Secondary | ICD-10-CM | POA: Diagnosis not present

## 2019-04-14 DIAGNOSIS — E1151 Type 2 diabetes mellitus with diabetic peripheral angiopathy without gangrene: Secondary | ICD-10-CM | POA: Diagnosis not present

## 2019-04-14 DIAGNOSIS — R1312 Dysphagia, oropharyngeal phase: Secondary | ICD-10-CM | POA: Diagnosis not present

## 2019-04-14 DIAGNOSIS — I69351 Hemiplegia and hemiparesis following cerebral infarction affecting right dominant side: Secondary | ICD-10-CM | POA: Diagnosis not present

## 2019-04-14 DIAGNOSIS — E1142 Type 2 diabetes mellitus with diabetic polyneuropathy: Secondary | ICD-10-CM | POA: Diagnosis not present

## 2019-04-14 DIAGNOSIS — I10 Essential (primary) hypertension: Secondary | ICD-10-CM | POA: Diagnosis not present

## 2019-04-14 DIAGNOSIS — I69391 Dysphagia following cerebral infarction: Secondary | ICD-10-CM | POA: Diagnosis not present

## 2019-04-14 DIAGNOSIS — I69320 Aphasia following cerebral infarction: Secondary | ICD-10-CM | POA: Diagnosis not present

## 2019-04-14 DIAGNOSIS — L89152 Pressure ulcer of sacral region, stage 2: Secondary | ICD-10-CM | POA: Diagnosis not present

## 2019-04-14 DIAGNOSIS — I69322 Dysarthria following cerebral infarction: Secondary | ICD-10-CM | POA: Diagnosis not present

## 2019-04-16 ENCOUNTER — Encounter: Payer: Self-pay | Admitting: Surgery

## 2019-04-16 DIAGNOSIS — E1151 Type 2 diabetes mellitus with diabetic peripheral angiopathy without gangrene: Secondary | ICD-10-CM | POA: Diagnosis not present

## 2019-04-16 DIAGNOSIS — R1312 Dysphagia, oropharyngeal phase: Secondary | ICD-10-CM | POA: Diagnosis not present

## 2019-04-16 DIAGNOSIS — I10 Essential (primary) hypertension: Secondary | ICD-10-CM | POA: Diagnosis not present

## 2019-04-16 DIAGNOSIS — L89152 Pressure ulcer of sacral region, stage 2: Secondary | ICD-10-CM | POA: Diagnosis not present

## 2019-04-16 DIAGNOSIS — I69351 Hemiplegia and hemiparesis following cerebral infarction affecting right dominant side: Secondary | ICD-10-CM | POA: Diagnosis not present

## 2019-04-16 DIAGNOSIS — I69391 Dysphagia following cerebral infarction: Secondary | ICD-10-CM | POA: Diagnosis not present

## 2019-04-16 DIAGNOSIS — I69320 Aphasia following cerebral infarction: Secondary | ICD-10-CM | POA: Diagnosis not present

## 2019-04-16 DIAGNOSIS — I69322 Dysarthria following cerebral infarction: Secondary | ICD-10-CM | POA: Diagnosis not present

## 2019-04-16 DIAGNOSIS — E1142 Type 2 diabetes mellitus with diabetic polyneuropathy: Secondary | ICD-10-CM | POA: Diagnosis not present

## 2019-04-17 DIAGNOSIS — I69391 Dysphagia following cerebral infarction: Secondary | ICD-10-CM | POA: Diagnosis not present

## 2019-04-17 DIAGNOSIS — R1312 Dysphagia, oropharyngeal phase: Secondary | ICD-10-CM | POA: Diagnosis not present

## 2019-04-17 DIAGNOSIS — E1151 Type 2 diabetes mellitus with diabetic peripheral angiopathy without gangrene: Secondary | ICD-10-CM | POA: Diagnosis not present

## 2019-04-17 DIAGNOSIS — I10 Essential (primary) hypertension: Secondary | ICD-10-CM | POA: Diagnosis not present

## 2019-04-17 DIAGNOSIS — L89152 Pressure ulcer of sacral region, stage 2: Secondary | ICD-10-CM | POA: Diagnosis not present

## 2019-04-17 DIAGNOSIS — E1142 Type 2 diabetes mellitus with diabetic polyneuropathy: Secondary | ICD-10-CM | POA: Diagnosis not present

## 2019-04-17 DIAGNOSIS — I69320 Aphasia following cerebral infarction: Secondary | ICD-10-CM | POA: Diagnosis not present

## 2019-04-17 DIAGNOSIS — I69322 Dysarthria following cerebral infarction: Secondary | ICD-10-CM | POA: Diagnosis not present

## 2019-04-17 DIAGNOSIS — I69351 Hemiplegia and hemiparesis following cerebral infarction affecting right dominant side: Secondary | ICD-10-CM | POA: Diagnosis not present

## 2019-04-18 DIAGNOSIS — R6889 Other general symptoms and signs: Secondary | ICD-10-CM | POA: Diagnosis not present

## 2019-04-18 DIAGNOSIS — Z8673 Personal history of transient ischemic attack (TIA), and cerebral infarction without residual deficits: Secondary | ICD-10-CM | POA: Diagnosis not present

## 2019-04-18 DIAGNOSIS — E1142 Type 2 diabetes mellitus with diabetic polyneuropathy: Secondary | ICD-10-CM | POA: Diagnosis not present

## 2019-04-18 DIAGNOSIS — E039 Hypothyroidism, unspecified: Secondary | ICD-10-CM | POA: Diagnosis not present

## 2019-04-18 DIAGNOSIS — E7800 Pure hypercholesterolemia, unspecified: Secondary | ICD-10-CM | POA: Diagnosis not present

## 2019-04-18 DIAGNOSIS — I10 Essential (primary) hypertension: Secondary | ICD-10-CM | POA: Diagnosis not present

## 2019-04-18 DIAGNOSIS — E1165 Type 2 diabetes mellitus with hyperglycemia: Secondary | ICD-10-CM | POA: Diagnosis not present

## 2019-04-21 DIAGNOSIS — I10 Essential (primary) hypertension: Secondary | ICD-10-CM | POA: Diagnosis not present

## 2019-04-21 DIAGNOSIS — R1312 Dysphagia, oropharyngeal phase: Secondary | ICD-10-CM | POA: Diagnosis not present

## 2019-04-21 DIAGNOSIS — I69391 Dysphagia following cerebral infarction: Secondary | ICD-10-CM | POA: Diagnosis not present

## 2019-04-21 DIAGNOSIS — L89152 Pressure ulcer of sacral region, stage 2: Secondary | ICD-10-CM | POA: Diagnosis not present

## 2019-04-21 DIAGNOSIS — E1151 Type 2 diabetes mellitus with diabetic peripheral angiopathy without gangrene: Secondary | ICD-10-CM | POA: Diagnosis not present

## 2019-04-21 DIAGNOSIS — I69320 Aphasia following cerebral infarction: Secondary | ICD-10-CM | POA: Diagnosis not present

## 2019-04-21 DIAGNOSIS — I69351 Hemiplegia and hemiparesis following cerebral infarction affecting right dominant side: Secondary | ICD-10-CM | POA: Diagnosis not present

## 2019-04-21 DIAGNOSIS — I69322 Dysarthria following cerebral infarction: Secondary | ICD-10-CM | POA: Diagnosis not present

## 2019-04-21 DIAGNOSIS — E1142 Type 2 diabetes mellitus with diabetic polyneuropathy: Secondary | ICD-10-CM | POA: Diagnosis not present

## 2019-04-22 DIAGNOSIS — I69322 Dysarthria following cerebral infarction: Secondary | ICD-10-CM | POA: Diagnosis not present

## 2019-04-22 DIAGNOSIS — L89152 Pressure ulcer of sacral region, stage 2: Secondary | ICD-10-CM | POA: Diagnosis not present

## 2019-04-22 DIAGNOSIS — E1151 Type 2 diabetes mellitus with diabetic peripheral angiopathy without gangrene: Secondary | ICD-10-CM | POA: Diagnosis not present

## 2019-04-22 DIAGNOSIS — R1312 Dysphagia, oropharyngeal phase: Secondary | ICD-10-CM | POA: Diagnosis not present

## 2019-04-22 DIAGNOSIS — I69351 Hemiplegia and hemiparesis following cerebral infarction affecting right dominant side: Secondary | ICD-10-CM | POA: Diagnosis not present

## 2019-04-22 DIAGNOSIS — I10 Essential (primary) hypertension: Secondary | ICD-10-CM | POA: Diagnosis not present

## 2019-04-22 DIAGNOSIS — E1142 Type 2 diabetes mellitus with diabetic polyneuropathy: Secondary | ICD-10-CM | POA: Diagnosis not present

## 2019-04-22 DIAGNOSIS — I69320 Aphasia following cerebral infarction: Secondary | ICD-10-CM | POA: Diagnosis not present

## 2019-04-22 DIAGNOSIS — I69391 Dysphagia following cerebral infarction: Secondary | ICD-10-CM | POA: Diagnosis not present

## 2019-04-23 ENCOUNTER — Inpatient Hospital Stay: Payer: Medicare HMO | Admitting: Adult Health

## 2019-04-23 DIAGNOSIS — E1142 Type 2 diabetes mellitus with diabetic polyneuropathy: Secondary | ICD-10-CM | POA: Diagnosis not present

## 2019-04-23 DIAGNOSIS — I69391 Dysphagia following cerebral infarction: Secondary | ICD-10-CM | POA: Diagnosis not present

## 2019-04-23 DIAGNOSIS — I69351 Hemiplegia and hemiparesis following cerebral infarction affecting right dominant side: Secondary | ICD-10-CM | POA: Diagnosis not present

## 2019-04-23 DIAGNOSIS — I69322 Dysarthria following cerebral infarction: Secondary | ICD-10-CM | POA: Diagnosis not present

## 2019-04-23 DIAGNOSIS — R1312 Dysphagia, oropharyngeal phase: Secondary | ICD-10-CM | POA: Diagnosis not present

## 2019-04-23 DIAGNOSIS — E1151 Type 2 diabetes mellitus with diabetic peripheral angiopathy without gangrene: Secondary | ICD-10-CM | POA: Diagnosis not present

## 2019-04-23 DIAGNOSIS — I69320 Aphasia following cerebral infarction: Secondary | ICD-10-CM | POA: Diagnosis not present

## 2019-04-23 DIAGNOSIS — L89152 Pressure ulcer of sacral region, stage 2: Secondary | ICD-10-CM | POA: Diagnosis not present

## 2019-04-23 DIAGNOSIS — I10 Essential (primary) hypertension: Secondary | ICD-10-CM | POA: Diagnosis not present

## 2019-04-24 ENCOUNTER — Encounter: Payer: Self-pay | Admitting: Adult Health

## 2019-04-25 DIAGNOSIS — L89152 Pressure ulcer of sacral region, stage 2: Secondary | ICD-10-CM | POA: Diagnosis not present

## 2019-04-25 DIAGNOSIS — I69320 Aphasia following cerebral infarction: Secondary | ICD-10-CM | POA: Diagnosis not present

## 2019-04-25 DIAGNOSIS — I10 Essential (primary) hypertension: Secondary | ICD-10-CM | POA: Diagnosis not present

## 2019-04-25 DIAGNOSIS — I69391 Dysphagia following cerebral infarction: Secondary | ICD-10-CM | POA: Diagnosis not present

## 2019-04-25 DIAGNOSIS — I69351 Hemiplegia and hemiparesis following cerebral infarction affecting right dominant side: Secondary | ICD-10-CM | POA: Diagnosis not present

## 2019-04-25 DIAGNOSIS — E1151 Type 2 diabetes mellitus with diabetic peripheral angiopathy without gangrene: Secondary | ICD-10-CM | POA: Diagnosis not present

## 2019-04-25 DIAGNOSIS — I69322 Dysarthria following cerebral infarction: Secondary | ICD-10-CM | POA: Diagnosis not present

## 2019-04-25 DIAGNOSIS — E1142 Type 2 diabetes mellitus with diabetic polyneuropathy: Secondary | ICD-10-CM | POA: Diagnosis not present

## 2019-04-25 DIAGNOSIS — R1312 Dysphagia, oropharyngeal phase: Secondary | ICD-10-CM | POA: Diagnosis not present

## 2019-04-29 DIAGNOSIS — I69320 Aphasia following cerebral infarction: Secondary | ICD-10-CM | POA: Diagnosis not present

## 2019-04-29 DIAGNOSIS — E1151 Type 2 diabetes mellitus with diabetic peripheral angiopathy without gangrene: Secondary | ICD-10-CM | POA: Diagnosis not present

## 2019-04-29 DIAGNOSIS — E1142 Type 2 diabetes mellitus with diabetic polyneuropathy: Secondary | ICD-10-CM | POA: Diagnosis not present

## 2019-04-29 DIAGNOSIS — I69322 Dysarthria following cerebral infarction: Secondary | ICD-10-CM | POA: Diagnosis not present

## 2019-04-29 DIAGNOSIS — L89152 Pressure ulcer of sacral region, stage 2: Secondary | ICD-10-CM | POA: Diagnosis not present

## 2019-04-29 DIAGNOSIS — I69351 Hemiplegia and hemiparesis following cerebral infarction affecting right dominant side: Secondary | ICD-10-CM | POA: Diagnosis not present

## 2019-04-29 DIAGNOSIS — I10 Essential (primary) hypertension: Secondary | ICD-10-CM | POA: Diagnosis not present

## 2019-04-29 DIAGNOSIS — I69391 Dysphagia following cerebral infarction: Secondary | ICD-10-CM | POA: Diagnosis not present

## 2019-04-29 DIAGNOSIS — R1312 Dysphagia, oropharyngeal phase: Secondary | ICD-10-CM | POA: Diagnosis not present

## 2019-04-30 ENCOUNTER — Other Ambulatory Visit: Payer: Self-pay

## 2019-04-30 DIAGNOSIS — I6930 Unspecified sequelae of cerebral infarction: Secondary | ICD-10-CM | POA: Diagnosis not present

## 2019-04-30 DIAGNOSIS — E785 Hyperlipidemia, unspecified: Secondary | ICD-10-CM | POA: Diagnosis not present

## 2019-04-30 DIAGNOSIS — Z7189 Other specified counseling: Secondary | ICD-10-CM | POA: Diagnosis not present

## 2019-04-30 DIAGNOSIS — I10 Essential (primary) hypertension: Secondary | ICD-10-CM | POA: Diagnosis not present

## 2019-04-30 DIAGNOSIS — Z0000 Encounter for general adult medical examination without abnormal findings: Secondary | ICD-10-CM | POA: Diagnosis not present

## 2019-04-30 DIAGNOSIS — Z89619 Acquired absence of unspecified leg above knee: Secondary | ICD-10-CM | POA: Diagnosis not present

## 2019-04-30 NOTE — Patient Outreach (Signed)
[facility]) Care Management  04/30/2019  Emma [name] [birth year] [id number]   Referral Date: 04/29/19 Referral Source: [facility] Care Management Referral Reason: needs help with PCP   Outreach Attempt: Telephone call to daughter in law [place] who is with patient now.  She placed phone on speaker. Spoke with patient who gives [place] permission to speak with CM.  Discussed reason for the referral. She states that they have picked Dr. Darleene Cleaver for her PCP while she is here as she states the plan is for patient to go to [place] to live with son and his family in [place] when patient is stable to fly.  Susanne Greenhouse states they have an appointment with the neurologist coming up soon.  She states that patient needs help with all aspects of care as the stroke left her with right sided weakness.  She states that patient will need help with care such as bathing, dressing, medication administration and laundry.  She states that patient is able to feed herself.    Discussed possible resources for care in this area. She states she is there with patient now and that she cares for her so those further resources are not needed.  However she has been in contact with [facility] concerning benefit once she moves to [place] and will wait to hear back from them.  She states that she plans if all goes well to be back in [place] with patient within 2 months.  She declines further [facility] services/follow up at this time but does have CM contact information if she has further needs.     Plan: RN CM will close case and send letter and brochure.     Bary Leriche, RN, MSN [facility] Care Management Care Management Coordinator Direct Line [phone] Toll Free: [phone]  Fax: [phone]

## 2019-05-01 DIAGNOSIS — I69391 Dysphagia following cerebral infarction: Secondary | ICD-10-CM | POA: Diagnosis not present

## 2019-05-01 DIAGNOSIS — L89152 Pressure ulcer of sacral region, stage 2: Secondary | ICD-10-CM | POA: Diagnosis not present

## 2019-05-01 DIAGNOSIS — E1151 Type 2 diabetes mellitus with diabetic peripheral angiopathy without gangrene: Secondary | ICD-10-CM | POA: Diagnosis not present

## 2019-05-01 DIAGNOSIS — I69320 Aphasia following cerebral infarction: Secondary | ICD-10-CM | POA: Diagnosis not present

## 2019-05-01 DIAGNOSIS — E1142 Type 2 diabetes mellitus with diabetic polyneuropathy: Secondary | ICD-10-CM | POA: Diagnosis not present

## 2019-05-01 DIAGNOSIS — R1312 Dysphagia, oropharyngeal phase: Secondary | ICD-10-CM | POA: Diagnosis not present

## 2019-05-01 DIAGNOSIS — I10 Essential (primary) hypertension: Secondary | ICD-10-CM | POA: Diagnosis not present

## 2019-05-01 DIAGNOSIS — I69351 Hemiplegia and hemiparesis following cerebral infarction affecting right dominant side: Secondary | ICD-10-CM | POA: Diagnosis not present

## 2019-05-01 DIAGNOSIS — I69322 Dysarthria following cerebral infarction: Secondary | ICD-10-CM | POA: Diagnosis not present

## 2019-05-06 DIAGNOSIS — I10 Essential (primary) hypertension: Secondary | ICD-10-CM | POA: Diagnosis not present

## 2019-05-06 DIAGNOSIS — I6930 Unspecified sequelae of cerebral infarction: Secondary | ICD-10-CM | POA: Diagnosis not present

## 2019-05-07 DIAGNOSIS — I69320 Aphasia following cerebral infarction: Secondary | ICD-10-CM | POA: Diagnosis not present

## 2019-05-07 DIAGNOSIS — I10 Essential (primary) hypertension: Secondary | ICD-10-CM | POA: Diagnosis not present

## 2019-05-07 DIAGNOSIS — I69391 Dysphagia following cerebral infarction: Secondary | ICD-10-CM | POA: Diagnosis not present

## 2019-05-07 DIAGNOSIS — E1142 Type 2 diabetes mellitus with diabetic polyneuropathy: Secondary | ICD-10-CM | POA: Diagnosis not present

## 2019-05-07 DIAGNOSIS — R1312 Dysphagia, oropharyngeal phase: Secondary | ICD-10-CM | POA: Diagnosis not present

## 2019-05-07 DIAGNOSIS — E1151 Type 2 diabetes mellitus with diabetic peripheral angiopathy without gangrene: Secondary | ICD-10-CM | POA: Diagnosis not present

## 2019-05-07 DIAGNOSIS — L89152 Pressure ulcer of sacral region, stage 2: Secondary | ICD-10-CM | POA: Diagnosis not present

## 2019-05-07 DIAGNOSIS — I69322 Dysarthria following cerebral infarction: Secondary | ICD-10-CM | POA: Diagnosis not present

## 2019-05-07 DIAGNOSIS — I69351 Hemiplegia and hemiparesis following cerebral infarction affecting right dominant side: Secondary | ICD-10-CM | POA: Diagnosis not present

## 2019-05-08 DIAGNOSIS — R1312 Dysphagia, oropharyngeal phase: Secondary | ICD-10-CM | POA: Diagnosis not present

## 2019-05-08 DIAGNOSIS — I69320 Aphasia following cerebral infarction: Secondary | ICD-10-CM | POA: Diagnosis not present

## 2019-05-08 DIAGNOSIS — E1142 Type 2 diabetes mellitus with diabetic polyneuropathy: Secondary | ICD-10-CM | POA: Diagnosis not present

## 2019-05-08 DIAGNOSIS — E1151 Type 2 diabetes mellitus with diabetic peripheral angiopathy without gangrene: Secondary | ICD-10-CM | POA: Diagnosis not present

## 2019-05-08 DIAGNOSIS — I69351 Hemiplegia and hemiparesis following cerebral infarction affecting right dominant side: Secondary | ICD-10-CM | POA: Diagnosis not present

## 2019-05-08 DIAGNOSIS — I10 Essential (primary) hypertension: Secondary | ICD-10-CM | POA: Diagnosis not present

## 2019-05-08 DIAGNOSIS — I69322 Dysarthria following cerebral infarction: Secondary | ICD-10-CM | POA: Diagnosis not present

## 2019-05-08 DIAGNOSIS — I69391 Dysphagia following cerebral infarction: Secondary | ICD-10-CM | POA: Diagnosis not present

## 2019-05-08 DIAGNOSIS — L89152 Pressure ulcer of sacral region, stage 2: Secondary | ICD-10-CM | POA: Diagnosis not present

## 2019-05-09 DIAGNOSIS — E1142 Type 2 diabetes mellitus with diabetic polyneuropathy: Secondary | ICD-10-CM | POA: Diagnosis not present

## 2019-05-09 DIAGNOSIS — L89152 Pressure ulcer of sacral region, stage 2: Secondary | ICD-10-CM | POA: Diagnosis not present

## 2019-05-09 DIAGNOSIS — R1312 Dysphagia, oropharyngeal phase: Secondary | ICD-10-CM | POA: Diagnosis not present

## 2019-05-09 DIAGNOSIS — I69322 Dysarthria following cerebral infarction: Secondary | ICD-10-CM | POA: Diagnosis not present

## 2019-05-09 DIAGNOSIS — I69391 Dysphagia following cerebral infarction: Secondary | ICD-10-CM | POA: Diagnosis not present

## 2019-05-09 DIAGNOSIS — I63512 Cerebral infarction due to unspecified occlusion or stenosis of left middle cerebral artery: Secondary | ICD-10-CM | POA: Diagnosis not present

## 2019-05-09 DIAGNOSIS — I69320 Aphasia following cerebral infarction: Secondary | ICD-10-CM | POA: Diagnosis not present

## 2019-05-09 DIAGNOSIS — R6889 Other general symptoms and signs: Secondary | ICD-10-CM | POA: Diagnosis not present

## 2019-05-09 DIAGNOSIS — I739 Peripheral vascular disease, unspecified: Secondary | ICD-10-CM | POA: Diagnosis not present

## 2019-05-09 DIAGNOSIS — N179 Acute kidney failure, unspecified: Secondary | ICD-10-CM | POA: Diagnosis not present

## 2019-05-09 DIAGNOSIS — I69351 Hemiplegia and hemiparesis following cerebral infarction affecting right dominant side: Secondary | ICD-10-CM | POA: Diagnosis not present

## 2019-05-09 DIAGNOSIS — I10 Essential (primary) hypertension: Secondary | ICD-10-CM | POA: Diagnosis not present

## 2019-05-09 DIAGNOSIS — E1151 Type 2 diabetes mellitus with diabetic peripheral angiopathy without gangrene: Secondary | ICD-10-CM | POA: Diagnosis not present

## 2019-05-11 DIAGNOSIS — I82402 Acute embolism and thrombosis of unspecified deep veins of left lower extremity: Secondary | ICD-10-CM | POA: Diagnosis not present

## 2019-05-11 DIAGNOSIS — I69391 Dysphagia following cerebral infarction: Secondary | ICD-10-CM | POA: Diagnosis not present

## 2019-05-11 DIAGNOSIS — L89152 Pressure ulcer of sacral region, stage 2: Secondary | ICD-10-CM | POA: Diagnosis not present

## 2019-05-11 DIAGNOSIS — Z89611 Acquired absence of right leg above knee: Secondary | ICD-10-CM | POA: Diagnosis not present

## 2019-05-11 DIAGNOSIS — I69351 Hemiplegia and hemiparesis following cerebral infarction affecting right dominant side: Secondary | ICD-10-CM | POA: Diagnosis not present

## 2019-05-12 ENCOUNTER — Emergency Department (HOSPITAL_COMMUNITY): Payer: Medicare HMO

## 2019-05-12 ENCOUNTER — Other Ambulatory Visit: Payer: Self-pay

## 2019-05-12 ENCOUNTER — Encounter (HOSPITAL_COMMUNITY): Payer: Self-pay | Admitting: Student

## 2019-05-12 ENCOUNTER — Inpatient Hospital Stay (HOSPITAL_COMMUNITY)
Admission: EM | Admit: 2019-05-12 | Discharge: 2019-05-16 | DRG: 871 | Disposition: A | Discharge status: Hospice | Payer: Medicare HMO | Attending: Pulmonary Disease | Admitting: Pulmonary Disease

## 2019-05-12 DIAGNOSIS — Z79890 Hormone replacement therapy (postmenopausal): Secondary | ICD-10-CM

## 2019-05-12 DIAGNOSIS — R000 Tachycardia, unspecified: Secondary | ICD-10-CM | POA: Diagnosis not present

## 2019-05-12 DIAGNOSIS — N390 Urinary tract infection, site not specified: Secondary | ICD-10-CM | POA: Diagnosis present

## 2019-05-12 DIAGNOSIS — Z20822 Contact with and (suspected) exposure to covid-19: Secondary | ICD-10-CM | POA: Diagnosis present

## 2019-05-12 DIAGNOSIS — Z90710 Acquired absence of both cervix and uterus: Secondary | ICD-10-CM

## 2019-05-12 DIAGNOSIS — Z515 Encounter for palliative care: Secondary | ICD-10-CM | POA: Diagnosis not present

## 2019-05-12 DIAGNOSIS — F17210 Nicotine dependence, cigarettes, uncomplicated: Secondary | ICD-10-CM | POA: Diagnosis present

## 2019-05-12 DIAGNOSIS — Z89611 Acquired absence of right leg above knee: Secondary | ICD-10-CM

## 2019-05-12 DIAGNOSIS — Z8673 Personal history of transient ischemic attack (TIA), and cerebral infarction without residual deficits: Secondary | ICD-10-CM

## 2019-05-12 DIAGNOSIS — R6521 Severe sepsis with septic shock: Secondary | ICD-10-CM | POA: Diagnosis not present

## 2019-05-12 DIAGNOSIS — N170 Acute kidney failure with tubular necrosis: Secondary | ICD-10-CM | POA: Diagnosis not present

## 2019-05-12 DIAGNOSIS — E1142 Type 2 diabetes mellitus with diabetic polyneuropathy: Secondary | ICD-10-CM | POA: Diagnosis present

## 2019-05-12 DIAGNOSIS — Z7401 Bed confinement status: Secondary | ICD-10-CM | POA: Diagnosis not present

## 2019-05-12 DIAGNOSIS — G9341 Metabolic encephalopathy: Secondary | ICD-10-CM | POA: Diagnosis present

## 2019-05-12 DIAGNOSIS — E875 Hyperkalemia: Secondary | ICD-10-CM | POA: Diagnosis present

## 2019-05-12 DIAGNOSIS — E7800 Pure hypercholesterolemia, unspecified: Secondary | ICD-10-CM | POA: Diagnosis present

## 2019-05-12 DIAGNOSIS — R0689 Other abnormalities of breathing: Secondary | ICD-10-CM | POA: Diagnosis not present

## 2019-05-12 DIAGNOSIS — M1990 Unspecified osteoarthritis, unspecified site: Secondary | ICD-10-CM | POA: Diagnosis present

## 2019-05-12 DIAGNOSIS — R0682 Tachypnea, not elsewhere classified: Secondary | ICD-10-CM | POA: Diagnosis not present

## 2019-05-12 DIAGNOSIS — A4151 Sepsis due to Escherichia coli [E. coli]: Principal | ICD-10-CM | POA: Diagnosis present

## 2019-05-12 DIAGNOSIS — A419 Sepsis, unspecified organism: Secondary | ICD-10-CM

## 2019-05-12 DIAGNOSIS — N179 Acute kidney failure, unspecified: Secondary | ICD-10-CM | POA: Diagnosis present

## 2019-05-12 DIAGNOSIS — Z66 Do not resuscitate: Secondary | ICD-10-CM | POA: Diagnosis present

## 2019-05-12 DIAGNOSIS — R05 Cough: Secondary | ICD-10-CM

## 2019-05-12 DIAGNOSIS — Z79899 Other long term (current) drug therapy: Secondary | ICD-10-CM

## 2019-05-12 DIAGNOSIS — R059 Cough, unspecified: Secondary | ICD-10-CM

## 2019-05-12 DIAGNOSIS — E1129 Type 2 diabetes mellitus with other diabetic kidney complication: Secondary | ICD-10-CM | POA: Diagnosis not present

## 2019-05-12 DIAGNOSIS — R6520 Severe sepsis without septic shock: Secondary | ICD-10-CM | POA: Diagnosis not present

## 2019-05-12 DIAGNOSIS — Z86718 Personal history of other venous thrombosis and embolism: Secondary | ICD-10-CM

## 2019-05-12 DIAGNOSIS — M8580 Other specified disorders of bone density and structure, unspecified site: Secondary | ICD-10-CM | POA: Diagnosis present

## 2019-05-12 DIAGNOSIS — R4182 Altered mental status, unspecified: Secondary | ICD-10-CM | POA: Diagnosis not present

## 2019-05-12 DIAGNOSIS — Z7984 Long term (current) use of oral hypoglycemic drugs: Secondary | ICD-10-CM | POA: Diagnosis not present

## 2019-05-12 DIAGNOSIS — I739 Peripheral vascular disease, unspecified: Secondary | ICD-10-CM | POA: Diagnosis present

## 2019-05-12 DIAGNOSIS — E785 Hyperlipidemia, unspecified: Secondary | ICD-10-CM | POA: Diagnosis present

## 2019-05-12 DIAGNOSIS — Z7901 Long term (current) use of anticoagulants: Secondary | ICD-10-CM | POA: Diagnosis not present

## 2019-05-12 DIAGNOSIS — Z79891 Long term (current) use of opiate analgesic: Secondary | ICD-10-CM

## 2019-05-12 DIAGNOSIS — I10 Essential (primary) hypertension: Secondary | ICD-10-CM | POA: Diagnosis present

## 2019-05-12 DIAGNOSIS — R404 Transient alteration of awareness: Secondary | ICD-10-CM | POA: Diagnosis not present

## 2019-05-12 DIAGNOSIS — E872 Acidosis: Secondary | ICD-10-CM | POA: Diagnosis present

## 2019-05-12 DIAGNOSIS — R410 Disorientation, unspecified: Secondary | ICD-10-CM | POA: Diagnosis not present

## 2019-05-12 DIAGNOSIS — L8990 Pressure ulcer of unspecified site, unspecified stage: Secondary | ICD-10-CM | POA: Insufficient documentation

## 2019-05-12 LAB — LACTIC ACID, PLASMA
Lactic Acid, Venous: >11 mmol/L (ref 0.5–1.9)
Lactic Acid, Venous: >11 mmol/L (ref 0.5–1.9)
Lactic Acid, Venous: >11 mmol/L (ref 0.5–1.9)

## 2019-05-12 LAB — CBC WITH DIFFERENTIAL/PLATELET
Abs Immature Granulocytes: 1.41 10*3/uL — ABNORMAL HIGH (ref 0.00–0.07)
Basophils Absolute: 0.1 10*3/uL (ref 0.0–0.1)
Basophils Relative: 0 %
Eosinophils Absolute: 0.1 10*3/uL (ref 0.0–0.5)
Eosinophils Relative: 0 %
HCT: 34.2 % — ABNORMAL LOW (ref 36.0–46.0)
Hemoglobin: 8.9 g/dL — ABNORMAL LOW (ref 12.0–15.0)
Immature Granulocytes: 6 %
Lymphocytes Relative: 14 %
Lymphs Abs: 3.3 10*3/uL (ref 0.7–4.0)
MCH: 31 pg (ref 26.0–34.0)
MCHC: 26 g/dL — ABNORMAL LOW (ref 30.0–36.0)
MCV: 119.2 fL — ABNORMAL HIGH (ref 80.0–100.0)
Monocytes Absolute: 0.9 10*3/uL (ref 0.1–1.0)
Monocytes Relative: 4 %
Neutro Abs: 18.5 10*3/uL — ABNORMAL HIGH (ref 1.7–7.7)
Neutrophils Relative %: 76 %
Platelets: 742 10*3/uL — ABNORMAL HIGH (ref 150–400)
RBC: 2.87 MIL/uL — ABNORMAL LOW (ref 3.87–5.11)
RDW: 13.2 % (ref 11.5–15.5)
WBC: 24.2 10*3/uL — ABNORMAL HIGH (ref 4.0–10.5)
nRBC: 0 % (ref 0.0–0.2)

## 2019-05-12 LAB — RESPIRATORY PANEL BY RT PCR (FLU A&B, COVID)
Influenza A by PCR: NEGATIVE
Influenza B by PCR: NEGATIVE
SARS Coronavirus 2 by RT PCR: NEGATIVE

## 2019-05-12 LAB — URINALYSIS, ROUTINE W REFLEX MICROSCOPIC
Bilirubin Urine: NEGATIVE
Bilirubin Urine: NEGATIVE
Glucose, UA: NEGATIVE mg/dL
Glucose, UA: NEGATIVE mg/dL
Ketones, ur: 5 mg/dL — AB
Ketones, ur: 5 mg/dL — AB
Nitrite: NEGATIVE
Nitrite: NEGATIVE
Protein, ur: 100 mg/dL — AB
Protein, ur: 100 mg/dL — AB
RBC / HPF: >50 RBC/hpf — ABNORMAL HIGH (ref 0–5)
RBC / HPF: >50 RBC/hpf — ABNORMAL HIGH (ref 0–5)
Specific Gravity, Urine: 1.013 (ref 1.005–1.030)
Specific Gravity, Urine: 1.012 (ref 1.005–1.030)
WBC, UA: >50 WBC/hpf — ABNORMAL HIGH (ref 0–5)
WBC, UA: >50 WBC/hpf — ABNORMAL HIGH (ref 0–5)
pH: 5 (ref 5.0–8.0)
pH: 5 (ref 5.0–8.0)

## 2019-05-12 LAB — COMPREHENSIVE METABOLIC PANEL
ALT: 16 U/L (ref 0–44)
AST: 31 U/L (ref 15–41)
Albumin: 3.5 g/dL (ref 3.5–5.0)
Alkaline Phosphatase: 72 U/L (ref 38–126)
BUN: 72 mg/dL — ABNORMAL HIGH (ref 8–23)
CO2: <7 mmol/L — ABNORMAL LOW (ref 22–32)
Calcium: 9.8 mg/dL (ref 8.9–10.3)
Chloride: 98 mmol/L (ref 98–111)
Creatinine, Ser: 7.05 mg/dL — ABNORMAL HIGH (ref 0.44–1.00)
GFR calc Af Amer: 6 mL/min — ABNORMAL LOW (ref >60)
GFR calc non Af Amer: 5 mL/min — ABNORMAL LOW (ref >60)
Glucose, Bld: 123 mg/dL — ABNORMAL HIGH (ref 70–99)
Potassium: 6.8 mmol/L (ref 3.5–5.1)
Sodium: 144 mmol/L (ref 135–145)
Total Bilirubin: 0.8 mg/dL (ref 0.3–1.2)
Total Protein: 7.5 g/dL (ref 6.5–8.1)

## 2019-05-12 LAB — BASIC METABOLIC PANEL
BUN: 69 mg/dL — ABNORMAL HIGH (ref 8–23)
BUN: 74 mg/dL — ABNORMAL HIGH (ref 8–23)
CO2: <7 mmol/L — ABNORMAL LOW (ref 22–32)
CO2: <7 mmol/L — ABNORMAL LOW (ref 22–32)
Calcium: 9 mg/dL (ref 8.9–10.3)
Calcium: 8.9 mg/dL (ref 8.9–10.3)
Chloride: 102 mmol/L (ref 98–111)
Chloride: 104 mmol/L (ref 98–111)
Creatinine, Ser: 6.27 mg/dL — ABNORMAL HIGH (ref 0.44–1.00)
Creatinine, Ser: 6.36 mg/dL — ABNORMAL HIGH (ref 0.44–1.00)
GFR calc Af Amer: 7 mL/min — ABNORMAL LOW (ref >60)
GFR calc Af Amer: 7 mL/min — ABNORMAL LOW (ref >60)
GFR calc non Af Amer: 6 mL/min — ABNORMAL LOW (ref >60)
GFR calc non Af Amer: 6 mL/min — ABNORMAL LOW (ref >60)
Glucose, Bld: 254 mg/dL — ABNORMAL HIGH (ref 70–99)
Glucose, Bld: 412 mg/dL — ABNORMAL HIGH (ref 70–99)
Potassium: 6.1 mmol/L — ABNORMAL HIGH (ref 3.5–5.1)
Potassium: 5.8 mmol/L — ABNORMAL HIGH (ref 3.5–5.1)
Sodium: 143 mmol/L (ref 135–145)
Sodium: 145 mmol/L (ref 135–145)

## 2019-05-12 LAB — SODIUM, URINE, RANDOM: Sodium, Ur: 81 mmol/L

## 2019-05-12 LAB — BLOOD GAS, VENOUS
FIO2: 24
O2 Saturation: 85.4 %
Patient temperature: 98.6
pCO2, Ven: <19 mmHg — CL (ref 44.0–60.0)
pH, Ven: <6.8 — CL (ref 7.250–7.430)
pO2, Ven: 88.9 mmHg — ABNORMAL HIGH (ref 32.0–45.0)

## 2019-05-12 LAB — TROPONIN I (HIGH SENSITIVITY)
Troponin I (High Sensitivity): 35 ng/L — ABNORMAL HIGH (ref <18)
Troponin I (High Sensitivity): 68 ng/L — ABNORMAL HIGH (ref <18)

## 2019-05-12 LAB — GLUCOSE, CAPILLARY
Glucose-Capillary: 229 mg/dL — ABNORMAL HIGH (ref 70–99)
Glucose-Capillary: 381 mg/dL — ABNORMAL HIGH (ref 70–99)
Glucose-Capillary: 191 mg/dL — ABNORMAL HIGH (ref 70–99)
Glucose-Capillary: 97 mg/dL (ref 70–99)

## 2019-05-12 LAB — LIPASE, BLOOD: Lipase: 43 U/L (ref 11–51)

## 2019-05-12 LAB — PROTIME-INR
INR: 1.6 — ABNORMAL HIGH (ref 0.8–1.2)
Prothrombin Time: 19.1 seconds — ABNORMAL HIGH (ref 11.4–15.2)

## 2019-05-12 LAB — HEPARIN LEVEL (UNFRACTIONATED): Heparin Unfractionated: 0.55 IU/mL (ref 0.30–0.70)

## 2019-05-12 LAB — MRSA PCR SCREENING: MRSA by PCR: NEGATIVE

## 2019-05-12 LAB — MAGNESIUM: Magnesium: 2.8 mg/dL — ABNORMAL HIGH (ref 1.7–2.4)

## 2019-05-12 LAB — APTT
aPTT: 30 seconds (ref 24–36)
aPTT: 169 seconds (ref 24–36)

## 2019-05-12 LAB — CREATININE, URINE, RANDOM: Creatinine, Urine: 31.09 mg/dL

## 2019-05-12 LAB — POC SARS CORONAVIRUS 2 AG -  ED: SARS Coronavirus 2 Ag: NEGATIVE

## 2019-05-12 MED ORDER — SODIUM BICARBONATE 8.4 % IV SOLN
50.0000 meq | Freq: Once | INTRAVENOUS | Status: AC
  Administered 2019-05-12: 50 meq via INTRAVENOUS
  Filled 2019-05-12: qty 50

## 2019-05-12 MED ORDER — CHLORHEXIDINE GLUCONATE CLOTH 2 % EX PADS
6.0000 | MEDICATED_PAD | Freq: Every day | CUTANEOUS | Status: DC
  Administered 2019-05-13 – 2019-05-14 (×2): 6 via TOPICAL

## 2019-05-12 MED ORDER — SODIUM CHLORIDE 0.9 % IV BOLUS (SEPSIS)
1000.0000 mL | Freq: Once | INTRAVENOUS | Status: AC
  Administered 2019-05-12: 1000 mL via INTRAVENOUS

## 2019-05-12 MED ORDER — VANCOMYCIN HCL IN DEXTROSE 1-5 GM/200ML-% IV SOLN
1000.0000 mg | Freq: Once | INTRAVENOUS | Status: AC
  Administered 2019-05-12: 10:00:00 1000 mg via INTRAVENOUS
  Filled 2019-05-12: qty 200

## 2019-05-12 MED ORDER — INSULIN ASPART 100 UNIT/ML IV SOLN
5.0000 [IU] | Freq: Once | INTRAVENOUS | Status: AC
  Administered 2019-05-12: 10:00:00 5 [IU] via INTRAVENOUS
  Filled 2019-05-12: qty 0.05

## 2019-05-12 MED ORDER — DEXTROSE 50 % IV SOLN
1.0000 | Freq: Once | INTRAVENOUS | Status: AC
  Administered 2019-05-12: 50 mL via INTRAVENOUS
  Filled 2019-05-12: qty 50

## 2019-05-12 MED ORDER — INSULIN ASPART 100 UNIT/ML ~~LOC~~ SOLN
0.0000 [IU] | SUBCUTANEOUS | Status: DC
  Administered 2019-05-12: 2 [IU] via SUBCUTANEOUS
  Administered 2019-05-12: 9 [IU] via SUBCUTANEOUS
  Administered 2019-05-12: 3 [IU] via SUBCUTANEOUS
  Administered 2019-05-13: 1 [IU] via SUBCUTANEOUS
  Administered 2019-05-14 (×3): 2 [IU] via SUBCUTANEOUS

## 2019-05-12 MED ORDER — STERILE WATER FOR INJECTION IV SOLN
INTRAVENOUS | Status: DC
  Filled 2019-05-12 (×9): qty 850

## 2019-05-12 MED ORDER — ONDANSETRON HCL 4 MG/2ML IJ SOLN: 4.0000 mg | Freq: Four times a day (QID) | INTRAMUSCULAR | Status: DC | PRN

## 2019-05-12 MED ORDER — INSULIN ASPART 100 UNIT/ML IV SOLN
10.0000 [IU] | Freq: Once | INTRAVENOUS | Status: AC
  Administered 2019-05-12: 10 [IU] via INTRAVENOUS

## 2019-05-12 MED ORDER — CALCIUM GLUCONATE-NACL 1-0.675 GM/50ML-% IV SOLN
1.0000 g | Freq: Once | INTRAVENOUS | Status: AC
  Administered 2019-05-12: 10:00:00 1000 mg via INTRAVENOUS
  Filled 2019-05-12: qty 50

## 2019-05-12 MED ORDER — SODIUM ZIRCONIUM CYCLOSILICATE 10 G PO PACK
10.0000 g | PACK | Freq: Once | ORAL | Status: DC
  Filled 2019-05-12: qty 1

## 2019-05-12 MED ORDER — VANCOMYCIN VARIABLE DOSE PER UNSTABLE RENAL FUNCTION (PHARMACIST DOSING): Status: DC

## 2019-05-12 MED ORDER — SODIUM CHLORIDE 0.9 % IV SOLN
2.0000 g | Freq: Once | INTRAVENOUS | Status: AC
  Administered 2019-05-12: 2 g via INTRAVENOUS
  Filled 2019-05-12: qty 2

## 2019-05-12 MED ORDER — ALBUTEROL SULFATE HFA 108 (90 BASE) MCG/ACT IN AERS
2.0000 | INHALATION_SPRAY | Freq: Once | RESPIRATORY_TRACT | Status: DC
  Filled 2019-05-12: qty 6.7

## 2019-05-12 MED ORDER — HEPARIN (PORCINE) 25000 UT/250ML-% IV SOLN
1000.0000 [IU]/h | INTRAVENOUS | Status: DC
  Administered 2019-05-12: 15:00:00 1000 [IU]/h via INTRAVENOUS
  Filled 2019-05-12: qty 250

## 2019-05-12 MED ORDER — METRONIDAZOLE IN NACL 5-0.79 MG/ML-% IV SOLN
500.0000 mg | Freq: Once | INTRAVENOUS | Status: AC
  Administered 2019-05-12: 500 mg via INTRAVENOUS
  Filled 2019-05-12: qty 100

## 2019-05-12 MED ORDER — HEPARIN (PORCINE) 25000 UT/250ML-% IV SOLN
800.0000 [IU]/h | INTRAVENOUS | Status: DC
  Administered 2019-05-13: 800 [IU]/h via INTRAVENOUS
  Filled 2019-05-12: qty 250

## 2019-05-12 MED ORDER — IOHEXOL 350 MG/ML SOLN: 100.0000 mL | Freq: Once | INTRAVENOUS | Status: DC | PRN

## 2019-05-12 MED ORDER — LEVOTHYROXINE SODIUM 100 MCG/5ML IV SOLN
50.0000 ug | Freq: Every day | INTRAVENOUS | Status: DC
  Administered 2019-05-12 – 2019-05-14 (×3): 50 ug via INTRAVENOUS
  Filled 2019-05-12 (×3): qty 5

## 2019-05-12 MED ORDER — SODIUM CHLORIDE 0.9 % IV SOLN
1.0000 g | INTRAVENOUS | Status: DC
  Administered 2019-05-13 – 2019-05-14 (×2): 1 g via INTRAVENOUS
  Filled 2019-05-12 (×2): qty 1

## 2019-05-12 MED ORDER — SODIUM CHLORIDE (PF) 0.9 % IJ SOLN
INTRAMUSCULAR | Status: AC
  Filled 2019-05-12: qty 50

## 2019-05-12 MED ORDER — DEXTROSE 50 % IV SOLN
1.0000 | Freq: Once | INTRAVENOUS | Status: AC
  Administered 2019-05-12: 15:00:00 50 mL via INTRAVENOUS
  Filled 2019-05-12: qty 50

## 2019-05-12 MED ORDER — CALCIUM GLUCONATE-NACL 1-0.675 GM/50ML-% IV SOLN
1.0000 g | Freq: Once | INTRAVENOUS | Status: AC
  Administered 2019-05-12: 1000 mg via INTRAVENOUS
  Filled 2019-05-12: qty 50

## 2019-05-12 NOTE — Progress Notes (Signed)
ANTICOAGULATION CONSULT NOTE   Pharmacy Consult for Heparin Indication: hx of DVT, CVA, PAD on Apixaban PTA   No Known Allergies  Patient Measurements: Height: _0  (165.1 cm) Weight: 65.8 kg (145 lb) IBW/kg (Calculated) : 57 Heparin Dosing Weight: 65.8 kg  Vital Signs: Temp: 95.5 F (35.3 C) (04/26 0844) Temp Source: Rectal (04/26 0844) BP: 97/54 (04/26 1219) Pulse Rate: 109 (04/26 1219)  Labs: Recent Labs    05/12/19 0901 05/12/19 0904  HGB 8.9*  --   HCT 34.2*  --   PLT 742*  --   APTT  --  30  LABPROT 19.1*  --   INR 1.6*  --   CREATININE 7.05*  --   TROPONINIHS  --  35*   Estimated Creatinine Clearance: 6.6 mL/min (A) (by C-G formula based on SCr of 7.05 mg/dL (H)).  Medical History: Past Medical History:  Diagnosis Date  . Arthritis    Back and right shoulder  . Constipation   . CVA (cerebral vascular accident) ([place]) 02/2019   Left MCA CVA  . Diabetes mellitus    Type II  . Diabetic neuropathy ([place])   . DVT (deep venous thrombosis) ([place]) 02/2019   On Eliquis   . History of blood transfusion   . Hypercholesteremia   . Hypertension   . Hypothyroidism   . Pneumonia    Medications:  Scheduled:  . albuterol  2 puff Inhalation Once  . sodium zirconium cyclosilicate  10 g Oral Once  . vancomycin variable dose per unstable renal function (pharmacist dosing)   Does not apply See admin instructions   Infusions:  . [START ON 05/13/2019] ceFEPime (MAXIPIME) IV    .  sodium bicarbonate (isotonic) infusion in sterile water 150 mL/hr at 05/12/19 1126    Assessment: 73 yoF, met sepsis criteria, hx AMS changes x 2 days. PTA Apixaban 39m bid for DVT noted 2/26 per doppler, held for renal fx, LD 4/24 (time unknown) Begin Heparin 4/26, no bolus, after baseline Heparin drawn, may need to give bolus depending on Hep level Baseline ant-coag: aPTT wnl 30 sec, Hep level elevated 0.55 units/ml - Apixaban effect still active on Hep level with poor renal fx. Will  monitor using aPTT until correlate with Hep level Hgb 8.9, Plt 742, Trop 68  Goal of Therapy:  Heparin level 0.3-0.7 units/ml  APTT 66-102 sec Monitor platelets by anticoagulation protocol: Yes   Plan:  Begin Heparin at 1000 units/hr, no bolus Heparin start delayed with IV access problems APTT at 2100 Daily CBC  GMinda DittoPharmD 05/12/2019,12:29 PM

## 2019-05-12 NOTE — Progress Notes (Signed)
Notified bedside nurse of need to draw repeat lactic acid. 

## 2019-05-12 NOTE — ED Notes (Signed)
Patient arrived via [facility] for AMS X 2 days.  Patient received 300 ml NS bolus.  Right AKA.  Family reports patient is bed bound at baseline per EMS.  Family denies any recent infections.

## 2019-05-12 NOTE — Progress Notes (Signed)
Notified provider of need to order 3rd lactic acid.

## 2019-05-12 NOTE — H&P (Signed)
NAMERuthetta [name], MRN:  [id number], DOB:  03-15-1945, LOS: 0 ADMISSION DATE:  05/12/2019, CONSULTATION DATE: 4/26  REFERRING MD:  Dr. Rush Landmark / EDP, CHIEF COMPLAINT:  Altered Mental Status    Brief History   74 y/o F admitted with reports of AMS.  Found to have possible UTI, AKI, hyperkalemia and AMS.    History of present illness   74 y/o F who presented to [facility] on 4/26 via EMS with reports of at least 48 hours of altered mental status.    On EMS arrival, the family reported that the patient is bed bound at baseline since her last admit.  She was recently admitted from 03/13/19 - 03/20/19 in the setting of left MCA infarct.  Hospital course at that time was complicated by LLE DVT treated with Eliquis at discharge.  Her son indicates she is largely in the bed, only up to chair occasionally with lift assist per her daughter-in-law.  The patient has right sided weakness and will report she "hurts on the right side" frequently.  The family noted she didn't eat / drink well yesterday or smoke and this was an indication that something wasn't right.  They noted she had not been herself for at least 48 hours.   Initial labs notable for K 6.8, CL 98, CO2 <7, glucose 123, BUN 72, Sr Cr 7.05, Mg 2.8, Lipase 43, AST 31, ALT 16, high sensitivity troponin 35, WBC 24.2, Hgb 8.9 & platelets 742.  Mild left shift noted on differential. UA with moderate leukocytes, many bacteria, >50 WBC.  The patient is unable to endorse urinary symptoms.  CT head was negative.  The patient was treated in the ER with 2L IVF, calcium, sodium bicarbonate, insulin and dextrose.     PCCM called for ICU admit.    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 DM II  HTN Hypothyroidism   Significant Hospital Events   4/26 Admit with AMS   Consults:  PCCM   Procedures:    Significant Diagnostic Tests:   CT Head 4/26 >> no acute findings, atrophy and chronic small vessel ischemic changes and late subacute left frontal  cortical, subcortical infarct as previously seen  Renal US 4/26 >>   Micro Data:  COVID PCR 4/26 >> negative  Influenza A/B 4/26 >> negative  UA 4/26 >> many bacteria, >50 WBC, moderate leukocytes  UC 4/26 >>  BCx2 4/26 >>   Antimicrobials:  Flagyl 4/26 x1 Cefepime 4/26 >>  Vanco 4/26 >>   Interim history/subjective:  ER RN reports pt received IVF bolus, BP stable, notes "cloudy looking urine" Family was at bedside but stepped out.  Normotensive Temp 95.5 (rectal)  Objective   Blood pressure 114/74, pulse (!) 109, temperature (!) 95.5 F (35.3 C), temperature source Rectal, resp. rate (!) 25, height 5\' 5"  (1.651 m), weight 65.8 kg, SpO2 100 %.        Intake/Output Summary (Last 24 hours) at 05/12/2019 1114 Last data filed at 05/12/2019 1104 Gross per 24 hour  Intake 469.13 ml  Output 200 ml  Net 269.13 ml   Filed Weights   05/12/19 0938  Weight: 65.8 kg    Examination: General: chronically ill appearing female lying in bed in NAD HEENT: MM pink/dry, no jvd, anicteric  Neuro: lying in bed with eyes open staring ahead, eyes widen to voice, states "yes" to questioning about pain, no spontaneous movements noted CV: s1s2 RRR, no m/r/g PULM: tachypnea but non-labored, lungs bilaterally  clear  GI: soft, bsx4 active  Extremities: warm/dry, LUE 2-3+ edema compared to RUE, LLE mottled, R AKA  Skin: mottling to LLE, skin cool to touch   CXR 4/26 >> images personally reviewed, no acute cardiopulmonary process  Resolved Hospital Problem list      Assessment & Plan:   Acute Metabolic Encephalopathy  Secondary to suspected UTI with AKI superimposed on baseline CVA.  CT head negative.  -supportive care -minimize sedating medications -frequent neuro exams -early PT efforts when able   Severe Sepsis with suspected Urinary Source UA concerning for UTI, patient unable to endorse symptoms.   -continue vanco, cefepime for empiric sepsis coverage -await cultures -admit to  SDU status, monitor hemodynamics  AKI  AGMA / Lactic Acidosis  Hyperkalemia  S/P bicarb, insulin, dextrose, calcium in ER -repeat BMP, lactic acid now -pt unable to take lokelma, may need repeat insulin, dextrose, bicarb, calcium -continue sodium bicarbonate gtt at 143ml/hr  -Nephrology consulted in ER -Trend BMP / urinary output -Replace electrolytes as indicated -Avoid nephrotoxic agents, ensure adequate renal perfusion -hold home metformin   Hx HTN, CVA -monitor BP trends  -supportive care  DM II  -SSI, sensitive scale   Hypothyroidism  -assess TSH  -hold home synthroid for now, may need IV replacement    Best practice:  Diet: NPO  Pain/Anxiety/Delirium protocol (if indicated): n/a  VAP protocol (if indicated): n/a  DVT prophylaxis: heparin gtt  GI prophylaxis: n/a  Glucose control: SSI  Mobility: BR  Code Status: DNR  Family Communication: Son Minerva Areola) called for update 4/26.  Reviewed patients status, concerns of her current lab findings and potential need for further support. Upon mentioning intubation to the son, he indicates that the patient has told him she would not want life support or CPR.  We reviewed plan of care that we will treat her aggressively with full medical care with the hope she may turn around but with the understanding that she may not survive current illness.  DNR order placed.  Disposition: SDU  Labs   CBC: Recent Labs  Lab 05/12/19 0901  WBC 24.2*  NEUTROABS 18.5*  HGB 8.9*  HCT 34.2*  MCV 119.2*  PLT 742*    Basic Metabolic Panel: Recent Labs  Lab 05/12/19 0901 05/12/19 0904  NA 144  --   K 6.8*  --   CL 98  --   CO2 <7*  --   GLUCOSE 123*  --   BUN 72*  --   CREATININE 7.05*  --   CALCIUM 9.8  --   MG  --  2.8*   GFR: Estimated Creatinine Clearance: 6.6 mL/min (A) (by C-G formula based on SCr of 7.05 mg/dL (H)). Recent Labs  Lab 05/12/19 0901  WBC 24.2*  LATICACIDVEN >11.0*    Liver Function Tests: Recent Labs    Lab 05/12/19 0901  AST 31  ALT 16  ALKPHOS 72  BILITOT 0.8  PROT 7.5  ALBUMIN 3.5   Recent Labs  Lab 05/12/19 0904  LIPASE 43   No results for input(s): AMMONIA in the last 168 hours.  ABG    Component Value Date/Time   PHART 7.382 03/26/2018 1820   PCO2ART 42.0 03/26/2018 1820   PO2ART 62.0 (L) 03/26/2018 1820   HCO3 NOT CALCULATED 05/12/2019 0958   TCO2 28 03/13/2019 1645   ACIDBASEDEF NOT CALCULATED 05/12/2019 0958   O2SAT 85.4 05/12/2019 0958     Coagulation Profile: Recent Labs  Lab 05/12/19 0901  INR 1.6*  Cardiac Enzymes: No results for input(s): CKTOTAL, CKMB, CKMBINDEX, TROPONINI in the last 168 hours.  HbA1C: Hgb A1c MFr Bld  Date/Time Value Ref Range Status  03/14/2019 03:41 AM 11.1 (H) 4.8 - 5.6 % Final    Comment:    (NOTE) Pre diabetes:          5.7%-6.4% Diabetes:              >6.4% Glycemic control for   <7.0% adults with diabetes   09/08/2014 01:50 PM 6.4 (H) 4.8 - 5.6 % Final    Comment:    (NOTE)         Pre-diabetes: 5.7 - 6.4         Diabetes: >6.4         Glycemic control for adults with diabetes: <7.0     CBG: No results for input(s): GLUCAP in the last 168 hours.  Review of Systems:   Unable to complete with patient due to AMS.  Son Randall Hiss) provided information, chart review and staff at bedside.   Past Medical History  She,  has a past medical history of Arthritis, Constipation, Diabetes mellitus, Diabetes mellitus without complication ([place]), Diabetic neuropathy ([place]), History of blood transfusion, Hypercholesteremia, Hypertension, Hypothyroidism, Pneumonia, and Thyroid disease.   Surgical History    Past Surgical History:  Procedure Laterality Date  . ABDOMINAL AORTOGRAM N/A 03/25/2018   Procedure: ABDOMINAL AORTOGRAM;  Surgeon: Waynetta Sandy, MD;  Location: [place] [facility];  Service: Cardiovascular;  Laterality: N/A;  . ABDOMINAL HYSTERECTOMY    . AMPUTATION Right 03/29/2018   Procedure: AMPUTATION  ABOVE KNEE;  Surgeon: Serafina Mitchell, MD;  Location: [place];  Service: Vascular;  Laterality: Right;  . APPENDECTOMY    . BACK SURGERY    . BYPASS GRAFT FEMORAL-PERONEAL Right 03/26/2018   Procedure: BYPASS GRAFT Campbell Stall;  Surgeon: Waynetta Sandy, MD;  Location: [place];  Service: Vascular;  Laterality: Right;  . EXPLORATORY LAPAROTOMY     Adhesions removed and appendix  . FEMORAL-POPLITEAL BYPASS GRAFT Right 03/27/2018   Procedure: Right lower extremity angiogram, Right Femoral Peroneal artery bypass, Angioplasty Right Femoral Peroneal bypass, Thrombectomy of Femoral peroneal artery;  Surgeon: Waynetta Sandy, MD;  Location: [place];  Service: Vascular;  Laterality: Right;  . INTRAOPERATIVE ARTERIOGRAM Right 03/26/2018   Procedure: Intra Operative Arteriogram;  Surgeon: Waynetta Sandy, MD;  Location: [place];  Service: Vascular;  Laterality: Right;  . LOWER EXTREMITY ANGIOGRAPHY Bilateral 03/25/2018   Procedure: LOWER EXTREMITY ANGIOGRAPHY;  Surgeon: Waynetta Sandy, MD;  Location: [place] [facility];  Service: Cardiovascular;  Laterality: Bilateral;  . LUMBAR LAMINECTOMY/DECOMPRESSION MICRODISCECTOMY N/A 09/09/2014   Procedure: L4-L5 DECOMPRESSION ;  Surgeon: Melina Schools, MD;  Location: [place];  Service: Orthopedics;  Laterality: N/A;  . PATCH ANGIOPLASTY Right 03/26/2018   Procedure: PATCH ANGIOPLASTY;  Surgeon: Waynetta Sandy, MD;  Location: [place];  Service: Vascular;  Laterality: Right;  . SHOULDER SURGERY    . TONSILLECTOMY    . TOTAL THYROIDECTOMY    . VEIN HARVEST Right 03/26/2018   Procedure: Vein Harvest of Right Leg Greater Saphenous Vein in SITU;  Surgeon: Waynetta Sandy, MD;  Location: [place];  Service: Vascular;  Laterality: Right;     Social History   reports that she has been smoking cigarettes. She has a 50.00 pack-year smoking history. She has never used smokeless tobacco. She reports current alcohol use. She  reports that she does not use drugs.  Family History   Her family history is not on file.   Allergies No Known Allergies   Home Medications  Prior to Admission medications   Medication Sig Start Date End Date Taking? Authorizing Provider  acetaminophen (TYLENOL) 325 MG tablet Take 2 tablets (650 mg total) by mouth every 6 (six) hours as needed for mild pain or moderate pain. 03/20/19   Agyei, Hermina Staggers, MD  Amino Acids-Protein Hydrolys (FEEDING SUPPLEMENT, PRO-STAT SUGAR FREE 64,) LIQD Take 30 mLs by mouth daily. To aid in wound healing    [provider]  apixaban (ELIQUIS) 5 MG TABS tablet Take 1 tablet (5 mg total) by mouth 2 (two) times daily. 04/08/19   Edmon Crape C, PA-C  atorvastatin (LIPITOR) 40 MG tablet Take 1 tablet (40 mg total) by mouth daily at 6 PM. 04/08/19   Edmon Crape C, PA-C  benzonatate (TESSALON) 100 MG capsule Take 1 capsule (100 mg total) by mouth 3 (three) times daily as needed for cough. 04/08/19   Edmon Crape C, PA-C  bisacodyl (DULCOLAX) 10 MG suppository If not relieved by MOM, give 10 mg Bisacodyl suppositiory rectally X 1 dose in 24 hours as needed (Do not use constipation standing orders for residents with renal failure/CFR less than 30. Contact MD for orders) (Physician Order)    [provider]  gabapentin (NEURONTIN) 600 MG tablet Take 2 tablets (1,200 mg total) by mouth 2 (two) times daily with breakfast and lunch. 04/08/19   Edmon Crape C, PA-C  glipiZIDE (GLUCOTROL) 5 MG tablet Take 5 mg by mouth daily. 03/11/19   [provider]  Glucerna (GLUCERNA) LIQD Take 237 mLs by mouth 3 (three) times daily between meals. Poor intake    [provider]  L-Methylfolate-B6-B12 (FOLTANX) 3-35-2 MG TABS Take 1 tablet by mouth daily. 04/08/19   Roena Malady, PA-C  levothyroxine (SYNTHROID) 100 MCG tablet Take 1 tablet (100 mcg total) by mouth daily. 04/08/19   Roena Malady, PA-C  levothyroxine (SYNTHROID) 125 MCG tablet Take 125 mcg by  mouth daily. 03/11/19   [provider]  magnesium hydroxide (MILK OF MAGNESIA) 400 MG/5ML suspension If no BM in 3 days, give 30 cc Milk of Magnesium p.o. x 1 dose in 24 hours as needed (Do not use standing constipation orders for residents with renal failure CFR less than 30. Contact MD for orders) (Physician Order)    [provider]  metFORMIN (GLUCOPHAGE) 1000 MG tablet Take 1 tablet (1,000 mg total) by mouth 2 (two) times daily with breakfast and lunch. 04/08/19   Edmon Crape C, PA-C  NON FORMULARY DIET:NAS Heart healthy diet    [provider]  tetrahydrozoline (VISINE) 0.05 % ophthalmic solution Place 1 drop into both eyes daily as needed (dry eyes). 04/08/19   Edmon Crape C, PA-C  traMADol (ULTRAM) 50 MG tablet Take 1 tablet (50 mg total) by mouth every 6 (six) hours as needed. x14 days 04/08/19   Roena Malady, PA-C     Critical care time: 34 minutes    Canary Brim, MSN, NP-C [facility] 05/12/2019, 11:14 AM   Please see [email or web address] for pager details.

## 2019-05-12 NOTE — Progress Notes (Signed)
ANTICOAGULATION CONSULT NOTE   Pharmacy Consult for Heparin Indication: hx of DVT, CVA, PAD on Apixaban PTA   No Known Allergies  Patient Measurements: Height: '5\' 5"'$  (165.1 cm) Weight: 65.8 kg (145 lb) IBW/kg (Calculated) : 57 Heparin Dosing Weight: 65.8 kg  Vital Signs: Temp: 97.9 F (36.6 C) (04/26 2000) Temp Source: Bladder (04/26 2000) BP: 70/43 (04/26 2000) Pulse Rate: 126 (04/26 2000)  Labs: Recent Labs    05/12/19 0901 05/12/19 0904 05/12/19 1145 05/12/19 1313 05/12/19 1525 05/12/19 2206  HGB 8.9*  --   --   --   --   --   HCT 34.2*  --   --   --   --   --   PLT 742*  --   --   --   --   --   APTT  --  30  --   --   --  169*  LABPROT 19.1*  --   --   --   --   --   INR 1.6*  --   --   --   --   --   HEPARINUNFRC  --   --   --  0.55  --   --   CREATININE 7.05*  --  6.27*  --  6.36*  --   TROPONINIHS  --  35* 68*  --   --   --    Estimated Creatinine Clearance: 7.3 mL/min (A) (by C-G formula based on SCr of 6.36 mg/dL (H)).  Medical History: Past Medical History:  Diagnosis Date  . Arthritis    Back and right shoulder  . Constipation   . CVA (cerebral vascular accident) ([place]) 02/2019   Left MCA CVA  . Diabetes mellitus    Type II  . Diabetic neuropathy ([place])   . DVT (deep venous thrombosis) ([place]) 02/2019   On Eliquis   . History of blood transfusion   . Hypercholesteremia   . Hypertension   . Hypothyroidism   . Pneumonia    Medications:  Scheduled:  . albuterol  2 puff Inhalation Once  . [START ON 05/13/2019] Chlorhexidine Gluconate Cloth  6 each Topical Q0600  . insulin aspart  0-9 Units Subcutaneous Q4H  . levothyroxine  50 mcg Intravenous Daily  . sodium zirconium cyclosilicate  10 g Oral Once  . vancomycin variable dose per unstable renal function (pharmacist dosing)   Does not apply See admin instructions   Infusions:  . [START ON 05/13/2019] ceFEPime (MAXIPIME) IV    . [START ON 05/13/2019] heparin    .  sodium bicarbonate (isotonic)  infusion in sterile water 150 mL/hr at 05/12/19 2150    Assessment: 82 yoF, met sepsis criteria, hx AMS changes x 2 days. PTA Apixaban '5mg'$  bid for DVT noted 2/26 per doppler, held for renal fx, LD 4/24 (time unknown) Begin Heparin 4/26, no bolus, after baseline Heparin drawn, may need to give bolus depending on Hep level Baseline ant-coag: aPTT wnl 30 sec, Hep level elevated 0.55 units/ml - Apixaban effect still active on Hep level with poor renal fx. Will monitor using aPTT until correlate with Hep level Hgb 8.9, Plt 742, Trop 68  05/12/19 11:28 PM  - aPTT supra-therapeutic at 169 - no bleeding per RN  Goal of Therapy:  Heparin level 0.3-0.7 units/ml  APTT 66-102 sec Monitor platelets by anticoagulation protocol: Yes   Plan: Pause heparin drip for 1 hour and reduce infusion rate to 800 units/hr  Repeat aPTT and HL  in 8 hours Daily CBC  Ulice Dash D PharmD 05/12/2019,11:27 PM

## 2019-05-12 NOTE — ED Notes (Signed)
[place]. EDP made aware of potassium and lactic acid.

## 2019-05-12 NOTE — ED Notes (Signed)
Critical value: Ph- <6.8  Pc02 <19 [place], EDP made aware.

## 2019-05-12 NOTE — Progress Notes (Addendum)
Pharmacy Antibiotic Note  Emma [name] is a 74 y.o. female admitted on 05/12/2019 with sepsis.  Pharmacy has been consulted for Vancomycin & Cefepime dosing.  Hypothermic on admit, WBC 24.2, SCr 7.05 ARF: last SCr in March was 1.0, anticipate renal fx will improve, Vanc doses based on levels til SCr improves CXray no active dz  Plan: Cefepime 2gm x1 in ED, then 1gm q24 starting 4/27 Vanc 1gm x1 (4/26 @ 10a), subsequent doses based on levels Flagyl 500mg  x1 in ED Daily SCr     Temp (24hrs), Avg:95.5 F (35.3 C), Min:95.5 F (35.3 C), Max:95.5 F (35.3 C)  Recent Labs  Lab 05/12/19 0901  WBC 24.2*    CrCl cannot be calculated (Patient's most recent lab result is older than the maximum 21 days allowed.).    No Known Allergies  Antimicrobials this admission: 4/26 Cefepime >>  4/26 Vanc >>  4/26 Flagyl x1  Dose adjustments this admission:  Microbiology results: 4/26 BCx: sent 4/26 UCx: sent  4/26 Resp PCR: Covid neg, Flu neg/neg  Thank you for allowing pharmacy to be a part of this patient's care.  5/26 05/12/2019 9:28 AM

## 2019-05-12 NOTE — ED Notes (Signed)
Patient unable to drink Lokelma, Dr. Rush Landmark aware.

## 2019-05-12 NOTE — Consult Note (Signed)
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  Brought into the emergency room with 2-day history of changes in mental status and some nausea/vomiting and diarrhea.  In the emergency room noted to be hypothermic, tachycardic and tachypneic with evidence of acute kidney injury, hyperkalemia and anion gap metabolic acidosis.  She is admitted for presumptive sepsis with lactic acid level >11.  Additional history cannot be confirmed.  Review of her medications shows that she is on gabapentin, Metformin and apixaban.  Not on any antihypertensive therapy contrary to prior history of hypertension.  Not on NSAIDs.  Past Medical History:  Diagnosis Date  . Arthritis    Back and right shoulder  . Constipation   . CVA (cerebral vascular accident) ([place]) 02/2019   Left MCA CVA  . Diabetes mellitus    Type II  . Diabetic neuropathy ([place])   . DVT (deep venous thrombosis) ([place]) 02/2019   On Eliquis   . History of blood transfusion   . Hypercholesteremia   . Hypertension   . Hypothyroidism   . Pneumonia     Past Surgical History:  Procedure Laterality Date  . ABDOMINAL AORTOGRAM N/A 03/25/2018   Procedure: ABDOMINAL AORTOGRAM;  Surgeon: Waynetta Sandy, MD;  Location: [facility];  Service: Cardiovascular;  Laterality: N/A;  . ABDOMINAL HYSTERECTOMY    . AMPUTATION Right 03/29/2018   Procedure: AMPUTATION ABOVE KNEE;  Surgeon: Serafina Mitchell, MD;  Location: [facility];  Service: Vascular;  Laterality: Right;  . APPENDECTOMY    . BACK SURGERY    . BYPASS GRAFT FEMORAL-PERONEAL Right 03/26/2018   Procedure: BYPASS GRAFT [place]all;  Surgeon: Waynetta Sandy, MD;  Location: [facility];  Service: Vascular;  Laterality: Right;  . EXPLORATORY LAPAROTOMY     Adhesions removed and appendix  . FEMORAL-POPLITEAL BYPASS GRAFT Right 03/27/2018   Procedure: Right lower extremity angiogram, Right Femoral Peroneal artery bypass, Angioplasty Right Femoral Peroneal bypass, Thrombectomy of Femoral peroneal artery;  Surgeon: Waynetta Sandy, MD;  Location: [facility];  Service: Vascular;  Laterality: Right;  . INTRAOPERATIVE ARTERIOGRAM Right 03/26/2018   Procedure: Intra Operative Arteriogram;  Surgeon: Waynetta Sandy, MD;  Location: [facility];  Service: Vascular;  Laterality: Right;  . LOWER EXTREMITY ANGIOGRAPHY Bilateral 03/25/2018   Procedure: LOWER EXTREMITY ANGIOGRAPHY;  Surgeon: Waynetta Sandy, MD;  Location: [facility];  Service: Cardiovascular;  Laterality: Bilateral;  . LUMBAR LAMINECTOMY/DECOMPRESSION MICRODISCECTOMY N/A 09/09/2014   Procedure: L4-L5 DECOMPRESSION ;  Surgeon: Melina Schools, MD;  Location: [facility];  Service: Orthopedics;  Laterality: N/A;  . PATCH ANGIOPLASTY Right 03/26/2018   Procedure: PATCH ANGIOPLASTY;  Surgeon: Waynetta Sandy, MD;  Location: [facility];  Service: Vascular;  Laterality: Right;  . SHOULDER SURGERY    . TONSILLECTOMY    . TOTAL THYROIDECTOMY    . VEIN HARVEST Right 03/26/2018   Procedure: Vein Harvest of Right Leg Greater Saphenous Vein in SITU;  Surgeon: Waynetta Sandy, MD;  Location: [facility];  Service: Vascular;  Laterality: Right;    History reviewed. No pertinent family history.  Social History:  reports that she has been smoking cigarettes. She has a 50.00 pack-year smoking history. She has never used smokeless tobacco. She reports current alcohol use. She reports that  she does not use drugs.  Allergies: No Known Allergies  Medications:  Scheduled: . albuterol  2 puff Inhalation Once  . sodium zirconium cyclosilicate  10 g Oral Once  . vancomycin variable dose per unstable renal function (pharmacist dosing)   Does not apply See admin instructions    BMP Latest Ref Rng & Units 05/12/2019 03/19/2019 03/18/2019  Glucose 70 - 99 mg/dL 284(X) 324(M) 010(U)  BUN 8 - 23 mg/dL 72(Z) 36(U) 23  Creatinine 0.44 - 1.00 mg/dL 4.40(H) 4.74 2.59(D)  Sodium 135 - 145 mmol/L 144 142 138  Potassium 3.5 - 5.1 mmol/L 6.8(HH) 4.1 4.5  Chloride 98 - 111 mmol/L 98 102 100  CO2 22 - 32 mmol/L <7(L) 24 22  Calcium 8.9 - 10.3 mg/dL 9.8 9.3 9.1   CBC Latest Ref Rng & Units 05/12/2019 03/19/2019 03/18/2019  WBC 4.0 - 10.5 K/uL 24.2(H) 5.3 6.7  Hemoglobin 12.0 - 15.0 g/dL 6.3(O) 10.5(L) 11.1(L)  Hematocrit 36.0 - 46.0 % 34.2(L) 33.8(L) 34.4(L)  Platelets 150 - 400 K/uL 742(H) 443(H) 390     CT Head Wo Contrast  Result Date: 05/12/2019 CLINICAL DATA:  Altered mental status over the last few days. EXAM: CT HEAD WITHOUT CONTRAST TECHNIQUE: Contiguous axial images were obtained from the base of the skull through the vertex without intravenous contrast. COMPARISON:  03/19/2019 CT.  03/13/2019 MRI. FINDINGS: Brain: Chronic small-vessel ischemic changes are present affecting the pons. No focal cerebellar finding. Cerebral hemispheres show atrophy with chronic small-vessel changes of the hemispheric white matter. Late subacute left frontal cortical and subcortical infarction as seen on previous studies. No mass lesion, hemorrhage, hydrocephalus or extra-axial collection. Vascular: There is atherosclerotic calcification of the major vessels at the base of the brain. Skull: Negative Sinuses/Orbits: Clear/normal Other: None IMPRESSION: No acute CT finding. Atrophy and chronic small-vessel ischemic changes and late subacute left frontal cortical and subcortical infarction as seen previously.  Electronically Signed   By: Paulina Fusi M.D.   On: 05/12/2019 10:55   DG Chest Port 1 View  Result Date: 05/12/2019 CLINICAL DATA:  Altered mental status over the last 2 days. EXAM: PORTABLE CHEST 1 VIEW COMPARISON:  11/25/2011 FINDINGS: The heart size and mediastinal contours are within normal limits. Both lungs are clear. The visualized skeletal structures are unremarkable. IMPRESSION: No active disease. Electronically Signed   By: Paulina Fusi M.D.   On: 05/12/2019 10:52    Review of Systems  Unable to perform ROS: Mental status change (Encephalopathic, unresponsive to questions)   Blood pressure 114/74, pulse (!) 109, temperature (!) 95.5 F (35.3 C), temperature source Rectal, resp. rate (!) 25, height 5\' 5"  (1.651 m), weight 65.8 kg, SpO2 100 %. Physical Exam  Nursing note and vitals reviewed. Constitutional: She appears well-developed. She appears distressed.  Tachypneic, appears fatigued  HENT:  Head: Normocephalic and atraumatic.  Dry oral mucosa/oropharynx  Eyes: Pupils are equal, round, and reactive to light. Conjunctivae are normal. No scleral icterus.  Neck: No JVD present. No thyromegaly present.  Cardiovascular: Regular rhythm and normal heart sounds.  No murmur heard. Regular tachycardia, normal S1 and S2  Respiratory: Breath sounds normal. She has no wheezes. She has no rales.  Tachypneic, grunting  GI: Soft. Bowel sounds are normal. There is no abdominal tenderness. There is no rebound.  Musculoskeletal:  General: No edema.     Cervical back: Normal range of motion and neck supple.     Comments: Status post right AKA  Neurological:  Encephalopathic  Skin: Skin is warm and dry. No rash noted.    Assessment/Plan: 1.  Acute kidney injury: This appears to be possibly hemodynamically mediated with decreased oral intake in the setting of GI losses and encephalopathy impairing adequate intake versus sepsis associated ATN.  Renal ultrasound is negative for any  obstruction but shows evidence of increased cortical echogenicity.  I have ordered urine electrolytes to further understand her intravascular volume status.  She is on isotonic sodium bicarbonate for management of her profound metabolic acidosis/hyperkalemia and for efforts at volume expansion.  I have ordered placement of Foley catheter for strict input/output quantification to help guide additional management.  I have ordered for repeat labs again this afternoon to follow potassium/bicarbonate levels with the specific question of whether she needs dialysis.  She is euvolemic to possibly hypovolemic on exam.  Avoid nonsteroidal anti-inflammatory drugs and iodinated intravenous contrast. 2.  Anion gap metabolic acidosis: Significantly elevated lactic acid level that indeed may be secondary to sepsis and likely worsened by acute kidney injury/preceding metformin use.  Will give isotonic sodium bicarbonate to help with management of hyperkalemia and prevent respiratory compromise.  Will recheck labs to determine need for possible dialysis. 3.  Hyperkalemia: Secondary to acute kidney injury, status post management with insulin/D50 and nuclear.  Started on isotonic sodium bicarbonate. 4.  Encephalopathy: Possibly from sepsis and partly pharmacological from gabapentin accumulation in the setting of acute kidney injury.  Follow with intravenous fluids. 5.  Possible sepsis: Empiric cultures pending, on broad-spectrum antibiotic coverage. 6.  History of DVT: Concern raised for possible PE given interruption of therapy with apixaban, discussed with Dr. Tarri Glenn have VQ scan but not CT angiogram.  Resume anticoagulation.  Rindi Beechy K. 05/12/2019, 11:24 AM

## 2019-05-12 NOTE — Progress Notes (Signed)
Son Minerva Areola) called regarding patients blood pressure, concern for worsening.  She appears to be transitioning toward death.  He indicates he does not want vasopressors initiated but wants to ensure that she is comfortable. He asks that I follow up with his wife when she arrives. Will continue to support patient / family.   Canary Brim, MSN, NP-C [facility] 05/12/2019, 3:14 PM   Please see [email or web address] for pager details.

## 2019-05-12 NOTE — ED Provider Notes (Signed)
[facility] DEPT Provider Note   CSN: [id number] Arrival date & time: 05/12/19  3785     History Chief Complaint  Patient presents with  . Altered Mental Status  . Diarrhea    Emma [name] is a 74 y.o. female.  The history is provided by medical records. The history is limited by the condition of the patient.  Altered Mental Status Presenting symptoms: confusion   Severity:  Severe Most recent episode:  2 days ago Episode history:  Single Timing:  Constant Progression:  Unchanged Chronicity:  New Context: recent illness   Associated symptoms: vomiting   Associated symptoms: no abdominal pain, no fever, no headaches and no seizures        Past Medical History:  Diagnosis Date  . Arthritis    Back and right shoulder  . Constipation   . Diabetes mellitus    Type II  . Diabetes mellitus without complication ([place])   . Diabetic neuropathy ([place])   . History of blood transfusion   . Hypercholesteremia   . Hypertension   . Hypothyroidism   . Pneumonia   . Thyroid disease     Patient Active Problem List   Diagnosis Date Noted  . Acute kidney injury ([place]) 03/22/2019  . Hypothyroidism 03/22/2019  . Dry eyes 03/22/2019  . DVT (deep venous thrombosis) ([place]) 03/14/2019  . TIA (transient ischemic attack) 03/13/2019  . Stroke (cerebrum) ([place]) 03/13/2019  . Acute ischemic left MCA stroke ([place])   . PAD (peripheral artery disease) ([place]) 03/23/2018  . Back pain 09/09/2014  . Hyperlipidemia associated with type 2 diabetes mellitus ([place]) 12/18/2006  . RECTAL POLYPS 12/18/2006  . Controlled diabetes mellitus type 2 with complications ([place]) 88/50/2774  . SBO 08/01/2006  . GOITER NOS 03/11/2006  . Diabetic peripheral neuropathy associated with type 2 diabetes mellitus ([place]) 03/11/2006  . HYPERTENSION 03/11/2006  . OSTEOPENIA 03/11/2006  . FOOT DROP 03/11/2006    Past Surgical History:  Procedure Laterality Date  . ABDOMINAL AORTOGRAM  N/A 03/25/2018   Procedure: ABDOMINAL AORTOGRAM;  Surgeon: Waynetta Sandy, MD;  Location: [facility];  Service: Cardiovascular;  Laterality: N/A;  . ABDOMINAL HYSTERECTOMY    . AMPUTATION Right 03/29/2018   Procedure: AMPUTATION ABOVE KNEE;  Surgeon: Serafina Mitchell, MD;  Location: [facility];  Service: Vascular;  Laterality: Right;  . APPENDECTOMY    . BACK SURGERY    . BYPASS GRAFT FEMORAL-PERONEAL Right 03/26/2018   Procedure: BYPASS GRAFT Campbell Stall;  Surgeon: Waynetta Sandy, MD;  Location: [facility];  Service: Vascular;  Laterality: Right;  . EXPLORATORY LAPAROTOMY     Adhesions removed and appendix  . FEMORAL-POPLITEAL BYPASS GRAFT Right 03/27/2018   Procedure: Right lower extremity angiogram, Right Femoral Peroneal artery bypass, Angioplasty Right Femoral Peroneal bypass, Thrombectomy of Femoral peroneal artery;  Surgeon: Waynetta Sandy, MD;  Location: [facility];  Service: Vascular;  Laterality: Right;  . INTRAOPERATIVE ARTERIOGRAM Right 03/26/2018   Procedure: Intra Operative Arteriogram;  Surgeon: Waynetta Sandy, MD;  Location: [facility];  Service: Vascular;  Laterality: Right;  . LOWER EXTREMITY ANGIOGRAPHY Bilateral 03/25/2018   Procedure: LOWER EXTREMITY ANGIOGRAPHY;  Surgeon: Waynetta Sandy, MD;  Location: [facility];  Service: Cardiovascular;  Laterality: Bilateral;  . LUMBAR LAMINECTOMY/DECOMPRESSION MICRODISCECTOMY N/A 09/09/2014   Procedure: L4-L5 DECOMPRESSION ;  Surgeon: Melina Schools, MD;  Location: [facility];  Service: Orthopedics;  Laterality: N/A;  . PATCH ANGIOPLASTY Right 03/26/2018   Procedure: PATCH ANGIOPLASTY;  Surgeon:  Maeola Harman, MD;  Location[facility];  Service: Vascular;  Laterality: Right;  . SHOULDER SURGERY    . TONSILLECTOMY    . TOTAL THYROIDECTOMY    . VEIN HARVEST Right 03/26/2018   Procedure: Vein Harvest of Right Leg Greater Saphenous Vein in SITU;  Surgeon: Maeola Harman, MD;   Location: [facility];  Service: Vascular;  Laterality: Right;     OB History   No obstetric history on file.     No family history on file.  Social History   Tobacco Use  . Smoking status: Current Every Day Smoker    Packs/day: 1.00    Years: 50.00    Pack years: 50.00    Types: Cigarettes  . Smokeless tobacco: Never Used  Substance Use Topics  . Alcohol use: Yes    Comment: occasional  . Drug use: Never    Home Medications Prior to Admission medications   Medication Sig Start Date End Date Taking? Authorizing Provider  acetaminophen (TYLENOL) 325 MG tablet Take 2 tablets (650 mg total) by mouth every 6 (six) hours as needed for mild pain or moderate pain. 03/20/19   Agyei, Hermina Staggers, MD  Amino Acids-Protein Hydrolys (FEEDING SUPPLEMENT, PRO-STAT SUGAR FREE 64,) LIQD Take 30 mLs by mouth daily. To aid in wound healing    [provider]  apixaban (ELIQUIS) 5 MG TABS tablet Take 1 tablet (5 mg total) by mouth 2 (two) times daily. 04/08/19   Edmon Crape C, PA-C  atorvastatin (LIPITOR) 40 MG tablet Take 1 tablet (40 mg total) by mouth daily at 6 PM. 04/08/19   Edmon Crape C, PA-C  benzonatate (TESSALON) 100 MG capsule Take 1 capsule (100 mg total) by mouth 3 (three) times daily as needed for cough. 04/08/19   Edmon Crape C, PA-C  bisacodyl (DULCOLAX) 10 MG suppository If not relieved by MOM, give 10 mg Bisacodyl suppositiory rectally X 1 dose in 24 hours as needed (Do not use constipation standing orders for residents with renal failure/CFR less than 30. Contact MD for orders) (Physician Order)    [provider]  gabapentin (NEURONTIN) 600 MG tablet Take 2 tablets (1,200 mg total) by mouth 2 (two) times daily with breakfast and lunch. 04/08/19   Edmon Crape C, PA-C  Glucerna (GLUCERNA) LIQD Take 237 mLs by mouth 3 (three) times daily between meals. Poor intake    [provider]  L-Methylfolate-B6-B12 (FOLTANX) 3-35-2 MG TABS Take 1 tablet by mouth daily. 04/08/19    Roena Malady, PA-C  levothyroxine (SYNTHROID) 100 MCG tablet Take 1 tablet (100 mcg total) by mouth daily. 04/08/19   Edmon Crape C, PA-C  magnesium hydroxide (MILK OF MAGNESIA) 400 MG/5ML suspension If no BM in 3 days, give 30 cc Milk of Magnesium p.o. x 1 dose in 24 hours as needed (Do not use standing constipation orders for residents with renal failure CFR less than 30. Contact MD for orders) (Physician Order)    [provider]  metFORMIN (GLUCOPHAGE) 1000 MG tablet Take 1 tablet (1,000 mg total) by mouth 2 (two) times daily with breakfast and lunch. 04/08/19   Edmon Crape C, PA-C  NON FORMULARY DIET:NAS Heart healthy diet    [provider]  tetrahydrozoline (VISINE) 0.05 % ophthalmic solution Place 1 drop into both eyes daily as needed (dry eyes). 04/08/19   Edmon Crape C, PA-C  traMADol (ULTRAM) 50 MG tablet Take 1 tablet (50 mg total) by mouth every 6 (six) hours as needed.  x14 days 04/08/19   Roena Malady, PA-C    Allergies    Patient has no known allergies.  Review of Systems   Review of Systems  Constitutional: Positive for chills and fatigue. Negative for diaphoresis and fever.  Gastrointestinal: Positive for vomiting. Negative for abdominal pain.  Neurological: Negative for seizures and headaches.  Psychiatric/Behavioral: Positive for confusion.    Physical Exam Updated Vital Signs BP (!) 145/76 (BP Location: Left Arm)   Pulse (!) 126   Temp (!) 95.5 F (35.3 C) (Rectal)   Resp (!) 27   SpO2 100%   Physical Exam  ED Results / Procedures / Treatments   Labs (all labs ordered are listed, but only abnormal results are displayed) Labs Reviewed  COMPREHENSIVE METABOLIC PANEL - Abnormal; Notable for the following components:      Result Value   Potassium 6.8 (*)    CO2 <7 (*)    Glucose, Bld 123 (*)    BUN 72 (*)    Creatinine, Ser 7.05 (*)    GFR calc non Af Amer 5 (*)    GFR calc Af Amer 6 (*)    All other components within normal limits    LACTIC ACID, PLASMA - Abnormal; Notable for the following components:   Lactic Acid, Venous >11.0 (*)    All other components within normal limits  LACTIC ACID, PLASMA - Abnormal; Notable for the following components:   Lactic Acid, Venous >11.0 (*)    All other components within normal limits  CBC WITH DIFFERENTIAL/PLATELET - Abnormal; Notable for the following components:   WBC 24.2 (*)    RBC 2.87 (*)    Hemoglobin 8.9 (*)    HCT 34.2 (*)    MCV 119.2 (*)    MCHC 26.0 (*)    Platelets 742 (*)    Neutro Abs 18.5 (*)    Abs Immature Granulocytes 1.41 (*)    All other components within normal limits  PROTIME-INR - Abnormal; Notable for the following components:   Prothrombin Time 19.1 (*)    INR 1.6 (*)    All other components within normal limits  URINALYSIS, ROUTINE W REFLEX MICROSCOPIC - Abnormal; Notable for the following components:   APPearance TURBID (*)    Hgb urine dipstick MODERATE (*)    Ketones, ur 5 (*)    Protein, ur 100 (*)    Leukocytes,Ua MODERATE (*)    RBC / HPF >50 (*)    WBC, UA >50 (*)    Bacteria, UA MANY (*)    All other components within normal limits  MAGNESIUM - Abnormal; Notable for the following components:   Magnesium 2.8 (*)    All other components within normal limits  BLOOD GAS, VENOUS - Abnormal; Notable for the following components:   pH, Ven <6.8 (*)    pCO2, Ven <19.0 (*)    pO2, Ven 88.9 (*)    All other components within normal limits  URINALYSIS, ROUTINE W REFLEX MICROSCOPIC - Abnormal; Notable for the following components:   APPearance TURBID (*)    Hgb urine dipstick MODERATE (*)    Ketones, ur 5 (*)    Protein, ur 100 (*)    Leukocytes,Ua MODERATE (*)    RBC / HPF >50 (*)    WBC, UA >50 (*)    Bacteria, UA MANY (*)    All other components within normal limits  BASIC METABOLIC PANEL - Abnormal; Notable for the following components:   Potassium 6.1 (*)  CO2 <7 (*)    Glucose, Bld 254 (*)    BUN 69 (*)    Creatinine,  Ser 6.27 (*)    GFR calc non Af Amer 6 (*)    GFR calc Af Amer 7 (*)    All other components within normal limits  GLUCOSE, CAPILLARY - Abnormal; Notable for the following components:   Glucose-Capillary 229 (*)    All other components within normal limits  GLUCOSE, CAPILLARY - Abnormal; Notable for the following components:   Glucose-Capillary 381 (*)    All other components within normal limits  TROPONIN I (HIGH SENSITIVITY) - Abnormal; Notable for the following components:   Troponin I (High Sensitivity) 35 (*)    All other components within normal limits  TROPONIN I (HIGH SENSITIVITY) - Abnormal; Notable for the following components:   Troponin I (High Sensitivity) 68 (*)    All other components within normal limits  CULTURE, BLOOD (ROUTINE X 2)  CULTURE, BLOOD (ROUTINE X 2)  RESPIRATORY PANEL BY RT PCR (FLU A&B, COVID)  URINE CULTURE  LIPASE, BLOOD  APTT  SODIUM, URINE, RANDOM  CREATININE, URINE, RANDOM  HEPARIN LEVEL (UNFRACTIONATED)  BASIC METABOLIC PANEL  LACTIC ACID, PLASMA  APTT  POC SARS CORONAVIRUS 2 AG -  ED    EKG EKG Interpretation  Date/Time:  Monday May 12 2019 10:48:54 EDT Ventricular Rate:  110 PR Interval:    QRS Duration: 107 QT Interval:  346 QTC Calculation: 468 R Axis:   58 Text Interpretation: Sinus tachycardia Borderline low voltage, extremity leads When compared to prior, faster rate amd sharper t waves. No STEMI Confirmed by Theda Belfastegeler, Chris ([id number]) on 05/12/2019 4:12:47 PM   Radiology CT Head Wo Contrast  Result Date: 05/12/2019 CLINICAL DATA:  Altered mental status over the last few days. EXAM: CT HEAD WITHOUT CONTRAST TECHNIQUE: Contiguous axial images were obtained from the base of the skull through the vertex without intravenous contrast. COMPARISON:  03/19/2019 CT.  03/13/2019 MRI. FINDINGS: Brain: Chronic small-vessel ischemic changes are present affecting the pons. No focal cerebellar finding. Cerebral hemispheres show atrophy with  chronic small-vessel changes of the hemispheric white matter. Late subacute left frontal cortical and subcortical infarction as seen on previous studies. No mass lesion, hemorrhage, hydrocephalus or extra-axial collection. Vascular: There is atherosclerotic calcification of the major vessels at the base of the brain. Skull: Negative Sinuses/Orbits: Clear/normal Other: None IMPRESSION: No acute CT finding. Atrophy and chronic small-vessel ischemic changes and late subacute left frontal cortical and subcortical infarction as seen previously. Electronically Signed   By: Paulina FusiMark  Shogry M.D.   On: 05/12/2019 10:55   US Renal  Result Date: 05/12/2019 CLINICAL DATA:  Acute kidney injury.  Altered mental status. EXAM: RENAL / URINARY TRACT ULTRASOUND COMPLETE COMPARISON:  CT 08/02/2010 FINDINGS: Right Kidney: Renal measurements: 10.6 x 4.9 x 5.1 cm = volume: 140 mL. Increased echogenicity of the cortical tissue. No hydronephrosis, cyst, mass or stone. Left Kidney: Renal measurements: 10.3 x 4.9 x 4.7 cm = volume: 123 mL. Increased echogenicity of the cortical tissue. No hydronephrosis, cyst, mass or stone. Bladder: Small amount of urine in the bladder. Other: None. IMPRESSION: Normal size kidneys without hydronephrosis or focal lesion. Probable increased echogenicity of the renal cortical tissue consistent with renal parenchymal disease. Electronically Signed   By: Paulina FusiMark  Shogry M.D.   On: 05/12/2019 11:33   DG Chest Port 1 View  Result Date: 05/12/2019 CLINICAL DATA:  Altered mental status over the last 2 days. EXAM: PORTABLE CHEST  1 VIEW COMPARISON:  11/25/2011 FINDINGS: The heart size and mediastinal contours are within normal limits. Both lungs are clear. The visualized skeletal structures are unremarkable. IMPRESSION: No active disease. Electronically Signed   By: Paulina Fusi M.D.   On: 05/12/2019 10:52    Procedures Procedures (including critical care time)  CRITICAL CARE Performed by: Canary Brim  Olanda Boughner Total critical care time: 50 minutes Critical care time was exclusive of separately billable procedures and treating other patients. Critical care was necessary to treat or prevent imminent or life-threatening deterioration. Critical care was time spent personally by me on the following activities: development of treatment plan with patient and/or surrogate as well as nursing, discussions with consultants, evaluation of patient's response to treatment, examination of patient, obtaining history from patient or surrogate, ordering and performing treatments and interventions, ordering and review of laboratory studies, ordering and review of radiographic studies, pulse oximetry and re-evaluation of patient's condition.   Medications Ordered in ED Medications  albuterol (VENTOLIN HFA) 108 (90 Base) MCG/ACT inhaler 2 puff (2 puffs Inhalation Not Given 05/12/19 1122)  sodium bicarbonate 150 mEq in sterile water 1,000 mL infusion ( Intravenous Restarted 05/12/19 1410)  vancomycin variable dose per unstable renal function (pharmacist dosing) (has no administration in time range)  ceFEPIme (MAXIPIME) 1 g in sodium chloride 0.9 % 100 mL IVPB (has no administration in time range)  ondansetron (ZOFRAN) injection 4 mg (has no administration in time range)  insulin aspart (novoLOG) injection 0-9 Units (9 Units Subcutaneous Given 05/12/19 1606)  sodium zirconium cyclosilicate (LOKELMA) packet 10 g (0 g Oral Hold 05/12/19 1331)  heparin ADULT infusion 100 units/mL (25000 units/272mL sodium chloride 0.45%) (1,000 Units/hr Intravenous New Bag/Given 05/12/19 1457)  levothyroxine (SYNTHROID, LEVOTHROID) injection 50 mcg (50 mcg Intravenous Given 05/12/19 1607)  ceFEPIme (MAXIPIME) 2 g in sodium chloride 0.9 % 100 mL IVPB (0 g Intravenous Stopped 05/12/19 0957)  metroNIDAZOLE (FLAGYL) IVPB 500 mg ( Intravenous Stopped 05/12/19 1030)  vancomycin (VANCOCIN) IVPB 1000 mg/200 mL premix ( Intravenous Stopped 05/12/19 1103)   sodium chloride 0.9 % bolus 1,000 mL (0 mLs Intravenous Stopped 05/12/19 1126)    And  sodium chloride 0.9 % bolus 1,000 mL (0 mLs Intravenous Stopped 05/12/19 1126)  calcium gluconate 1 g/ 50 mL sodium chloride IVPB ( Intravenous Stopped 05/12/19 1044)  insulin aspart (novoLOG) injection 5 Units (5 Units Intravenous Given 05/12/19 1013)    And  dextrose 50 % solution 50 mL (50 mLs Intravenous Given 05/12/19 1013)  sodium chloride (PF) 0.9 % injection (  Given by Other 05/12/19 1047)  calcium gluconate 1 g/ 50 mL sodium chloride IVPB (1,000 mg Intravenous New Bag/Given 05/12/19 1453)  insulin aspart (novoLOG) injection 10 Units (10 Units Intravenous Given 05/12/19 1445)    And  dextrose 50 % solution 50 mL (50 mLs Intravenous Given 05/12/19 1445)  sodium bicarbonate injection 50 mEq (50 mEq Intravenous Given 05/12/19 1445)    ED Course  I have reviewed the triage vital signs and the nursing notes.  Pertinent labs & imaging results that were available during my care of the patient were reviewed by me and considered in my medical decision making (see chart for details).    MDM Rules/Calculators/A&P                      Danaiya Steadman is a 74 y.o. female with a past medical history significant for hypertension, hyperlipidemia, diabetes, prior DVT in left lower extremity on Eliquis, right leg  amputation, PAD, right and recent stroke who presents with altered mental status, tachycardia, tachypnea, cool temperature, diarrhea, cough, and malaise.  According to EMS report to nursing, has been altered for the last few days from her baseline.  Is unclear what her baseline is but family has not yet arrived.  Patient was recently admitted for stroke last month.    On arrival, patient is only able to say yes to certain questions but is not answering all questions appropriately.  Patient is tachycardic and tachypneic and has some rhonchi in lungs.  No wheezing.  Chest and abdomen do not appear to be tender.   Patient has palpable pulse in her left lower extremity.  Her right leg is amputated.  Patient has symmetric pupils has no facial droop, initial exam.  She is on room air.  She is very tachycardic with rates into the 140s and is tachypneic with rates in the 30s.  Clinically I am concerned about sepsis given her tachycardia, tachypnea, and cold temperature with diarrhea and reported cough and dysuria symptoms.  We will make her a code sepsis getting started however given the vital signs and the recent acute DVT in her left leg, will also get work-up for pulmonary embolism.  Will get CT head given her recent stroke.  Anticipate reassessment after work-up and admission for further management.  10:06 AM Labs began to return and were concerning.  The initial Covid test was negative, will send PCR.  Patient's metabolic panel returned showing a creatinine of 7.05 up from 1.0 previously.  Potassium is also elevated at 6.8 with no hemolysis.  CO2 is undetectable less than 7.  Lactic acid is greater than 11.  Patient was made a code sepsis on my initial evaluation.  We will continue to fluids.  CBC shows a leukocytosis of 24.2 with anemia of 8.9.  Family was able to arrive and reports that although patient had a stroke last month, she was approximately 85% communicating well until the last few days when she has been more confused and only saying yes repeatedly.  Family was informed that given the patient's new creatinine elevation, we will be talking to nephrology and treat her hyperkalemia with medications.  She will not build to get the CT PE study with initially ordered so we will cancel this.  They do say that she missed her blood thinner yesterday due to nausea and vomiting.  They do agree that she is at the diarrhea.  Anticipate touching base with nephrology prior to speaking to critical care.      10:34 AM Just spoke with Dr. Allena Katz with nephrology who agrees the patient needs to be seen by them.  He  requested a renal ultrasound, hyperkalemia management including a bicarb drip and Lokelma as well as the insulin, calcium, and albuterol treatments.  He requested we speak to critical care for admission here at [facility] long.  He also agreed with a VQ scan instead of a CT PE study.  This will be changed.  I spoke with critical care who will come see the patient and admit.  Second Covid test is being sent.    Final Clinical Impression(s) / ED Diagnoses Final diagnoses:  Hyperkalemia  AKI (acute kidney injury) (HCC)  Altered mental status, unspecified altered mental status type  Sepsis with acute renal failure, due to unspecified organism, unspecified acute renal failure type, unspecified whether septic shock present (HCC)  Cough     Clinical Impression: 1. Hyperkalemia   2. Sepsis (  HCC)   3. AKI (acute kidney injury) (HCC)   4. Altered mental status, unspecified altered mental status type   5. Sepsis with acute renal failure, due to unspecified organism, unspecified acute renal failure type, unspecified whether septic shock present (HCC)   6. Cough     Disposition: Admit  This note was prepared with assistance of Dragon voice recognition software. Occasional wrong-word or sound-a-like substitutions may have occurred due to the inherent limitations of voice recognition software.     Dayja Loveridge, Canary Brim, MD 05/12/19 [phone]

## 2019-05-12 NOTE — Progress Notes (Signed)
eLink Physician-Brief Progress Note Patient Name: Emma [name] DOB: [birth year] MRN: [id number]   Date of Service  05/12/2019  HPI/Events of Note  APTT 169, Heparin paused per protocol, pharmacy is managing Heparin infusion.  eICU Interventions  No new intervention.        Thomasene Lot Kevis Qu 05/12/2019, 11:52 PM

## 2019-05-13 ENCOUNTER — Inpatient Hospital Stay (HOSPITAL_COMMUNITY): Payer: Medicare HMO

## 2019-05-13 DIAGNOSIS — L8990 Pressure ulcer of unspecified site, unspecified stage: Secondary | ICD-10-CM | POA: Insufficient documentation

## 2019-05-13 LAB — GLUCOSE, CAPILLARY
Glucose-Capillary: 63 mg/dL — ABNORMAL LOW (ref 70–99)
Glucose-Capillary: 116 mg/dL — ABNORMAL HIGH (ref 70–99)
Glucose-Capillary: 109 mg/dL — ABNORMAL HIGH (ref 70–99)
Glucose-Capillary: 118 mg/dL — ABNORMAL HIGH (ref 70–99)
Glucose-Capillary: 130 mg/dL — ABNORMAL HIGH (ref 70–99)
Glucose-Capillary: 98 mg/dL (ref 70–99)
Glucose-Capillary: 116 mg/dL — ABNORMAL HIGH (ref 70–99)

## 2019-05-13 MED ORDER — DEXTROSE 50 % IV SOLN
INTRAVENOUS | Status: AC
  Administered 2019-05-13: 50 mL via INTRAVENOUS
  Filled 2019-05-13: qty 50

## 2019-05-13 NOTE — Progress Notes (Signed)
Patients blood sugar at was 63 @ 04:00. She recieved half an amp of dextrose, and blood sugar came up to 116 when rechecked 15 minutes later. RN will continue to monitor patient.

## 2019-05-13 NOTE — Progress Notes (Signed)
ANTICOAGULATION CONSULT NOTE - Follow Up Consult  Pharmacy Consult for Heparin Indication: hx of DVT, CVA, PAD on Apixaban PTA   No Known Allergies  Patient Measurements: Height: '5\' 5"'$  (165.1 cm) Weight: 55.9 kg (123 lb 3.8 oz) IBW/kg (Calculated) : 57 Heparin Dosing Weight: TBW  Vital Signs: Temp: 99.5 F (37.5 C) (04/27 0400) Temp Source: Bladder (04/27 0400) BP: 118/44 (04/27 0600) Pulse Rate: 116 (04/27 0000)  Labs: Recent Labs    05/12/19 0901 05/12/19 0904 05/12/19 1145 05/12/19 1313 05/12/19 1525 05/12/19 2206  HGB 8.9*  --   --   --   --   --   HCT 34.2*  --   --   --   --   --   PLT 742*  --   --   --   --   --   APTT  --  30  --   --   --  169*  LABPROT 19.1*  --   --   --   --   --   INR 1.6*  --   --   --   --   --   HEPARINUNFRC  --   --   --  0.55  --   --   CREATININE 7.05*  --  6.27*  --  6.36*  --   TROPONINIHS  --  35* 68*  --   --   --     Estimated Creatinine Clearance: 7.2 mL/min (A) (by C-G formula based on SCr of 6.36 mg/dL (H)).   Medications:  Infusions:  . ceFEPime (MAXIPIME) IV    . heparin 800 Units/hr (05/13/19 0036)  .  sodium bicarbonate (isotonic) infusion in sterile water 150 mL/hr at 05/13/19 0600    Assessment: 73 yoF, met sepsis criteria, hx AMS changes x 2 days.  Pharmacy is consulted for heparin IV dosing. PTA Apixaban '5mg'$  bid for DVT noted 2/26 per doppler, held for renal fx, LD 4/24 (time unknown) Baseline ant-coag: aPTT wnl 30 sec, Hep level elevated 0.55 units/ml - Apixaban effect still active on Hep level with poor renal fx. Will monitor using aPTT until correlate with Hep level.  Today, 05/13/2019: APTT/Heparin level previously elevated and heparin reduced to 800 units/hr. Labs ordered x2 this morning, but d/c both times d/t expected change to comfort care. No bleeding or complications reported.  Goal of Therapy:  Heparin level 0.3-0.7 units/ml aPTT 66-102 seconds Monitor platelets by anticoagulation protocol:  Yes   Plan:  Continue Heparin at 800 unit/hr Get levels, CBC if not changed to comfort care.  Gretta Arab PharmD, BCPS Clinical Pharmacist [facility] main pharmacy [phone] 05/13/2019 7:16 AM

## 2019-05-13 NOTE — Progress Notes (Signed)
NAMEAllahna [name], MRN:  [id number], DOB:  1945-09-06, LOS: 1 ADMISSION DATE:  05/12/2019, CONSULTATION DATE: 4/26  REFERRING MD:  Dr. Rush Landmark / EDP, CHIEF COMPLAINT:  Altered Mental Status    Brief History   74 y/o F admitted with reports of AMS.  Found to have UTI, AKI, profound acidosis, hyperkalemia and AMS.     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 DM II  HTN Hypothyroidism   Significant Hospital Events   4/26 Admit with AMS   Consults:  PCCM   Procedures:    Significant Diagnostic Tests:   CT Head 4/26 >> no acute findings, atrophy and chronic small vessel ischemic changes and late subacute left frontal cortical, subcortical infarct as previously seen  Renal US 4/26 >>   Micro Data:  COVID PCR 4/26 >> negative  Influenza A/B 4/26 >> negative  UA 4/26 >> many bacteria, >50 WBC, moderate leukocytes  UC 4/26 >> 100k GNR >> BCx2 4/26 >>   Antimicrobials:  Flagyl 4/26 x1 Cefepime 4/26 >>  Vanco 4/26 >>   Interim history/subjective:  RN reports no acute events overnight, pt staring off not communicating with staff / family Daughter in law at bedside  Tmax 99.5    Objective   Blood pressure (!) 115/46, pulse (!) 116, temperature 99.5 F (37.5 C), temperature source Bladder, resp. rate (!) 23, height 5\' 5"  (1.651 m), weight 55.9 kg, SpO2 100 %.        Intake/Output Summary (Last 24 hours) at 05/13/2019 05/15/2019 Last data filed at 05/13/2019 0645 Gross per 24 hour  Intake 2611.54 ml  Output 425 ml  Net 2186.54 ml   Filed Weights   05/12/19 0938 05/13/19 0450  Weight: 65.8 kg 55.9 kg    Examination: General: elderly female lying in bed in NAD, daughter-in-law at bedside    HEENT: MM pink/dry, pupils equal/reactive Neuro: Patient awakens to voice, turns head to left to look at daughter-in-law, no interaction with staff, does not answer questions or follow commands CV: s1s2 RRR, no m/r/g PULM: Mild abdominal accessory muscle use but not labored,  lungs bilaterally clear GI: soft, bsx4 active  Extremities: warm/dry, trace to 1+ generalized edema, right AKA Skin: no rashes or lesions  Resolved Hospital Problem list      Assessment & Plan:   Acute Metabolic Encephalopathy  Secondary to GNR UTI with AKI superimposed on baseline CVA.  CT head negative.  -continue supportive care until son can arrive -follow neuro exam   Severe Sepsis with Urinary Source GNR UTI GNR UTI on UC.   -continue abx as ordered for now  -await cultures  -continue SDU status for now  -anticipate family will transition to full comfort care on sons arrival  AKI  AGMA / Lactic Acidosis  Hyperkalemia  -continue bicarb gtt for now  -hold further labs  Hx HTN, CVA -follow BP trend in ICU   DM II  -SSI, sensitive scale   Hypothyroidism  -hold synthroid for now   Best practice:  Diet: NPO  Pain/Anxiety/Delirium protocol (if indicated): n/a  VAP protocol (if indicated): n/a  DVT prophylaxis: heparin gtt  GI prophylaxis: n/a  Glucose control: SSI  Mobility: BR  Code Status: DNR  Family Communication: Daughter-in-law updated at bedside on patients status 4/27 am. She shares that the patient has been "ready to go for a while now".  She has prepared for her death by arranging for her tombstone and has made statements that she wants  to go be with her [name] Marchia Bond. Son is in route from [place]. Will revisit and update him on his arrival. Continue current medical efforts for now.  Anticipate full comfort care transition based on discussion with family.   Disposition: SDU  Labs   CBC: Recent Labs  Lab 05/12/19 0901  WBC 24.2*  NEUTROABS 18.5*  HGB 8.9*  HCT 34.2*  MCV 119.2*  PLT 742*    Basic Metabolic Panel: Recent Labs  Lab 05/12/19 0901 05/12/19 0904 05/12/19 1145 05/12/19 1525  NA 144  --  143 145  K 6.8*  --  6.1* 5.8*  CL 98  --  102 104  CO2 <7*  --  <7* <7*  GLUCOSE 123*  --  254* 412*  BUN 72*  --  69* 74*  CREATININE 7.05*   --  6.27* 6.36*  CALCIUM 9.8  --  9.0 8.9  MG  --  2.8*  --   --    GFR: Estimated Creatinine Clearance: 7.2 mL/min (A) (by C-G formula based on SCr of 6.36 mg/dL (H)). Recent Labs  Lab 05/12/19 0901 05/12/19 1145 05/12/19 1525  WBC 24.2*  --   --   LATICACIDVEN >11.0* >11.0* >11.0*    Liver Function Tests: Recent Labs  Lab 05/12/19 0901  AST 31  ALT 16  ALKPHOS 72  BILITOT 0.8  PROT 7.5  ALBUMIN 3.5   Recent Labs  Lab 05/12/19 0904  LIPASE 43   No results for input(s): AMMONIA in the last 168 hours.  ABG    Component Value Date/Time   PHART 7.382 03/26/2018 1820   PCO2ART 42.0 03/26/2018 1820   PO2ART 62.0 (L) 03/26/2018 1820   HCO3 NOT CALCULATED 05/12/2019 0958   TCO2 28 03/13/2019 1645   ACIDBASEDEF NOT CALCULATED 05/12/2019 0958   O2SAT 85.4 05/12/2019 0958     Coagulation Profile: Recent Labs  Lab 05/12/19 0901  INR 1.6*    Cardiac Enzymes: No results for input(s): CKTOTAL, CKMB, CKMBINDEX, TROPONINI in the last 168 hours.  HbA1C: Hgb A1c MFr Bld  Date/Time Value Ref Range Status  03/14/2019 03:41 AM 11.1 (H) 4.8 - 5.6 % Final    Comment:    (NOTE) Pre diabetes:          5.7%-6.4% Diabetes:              >6.4% Glycemic control for   <7.0% adults with diabetes   09/08/2014 01:50 PM 6.4 (H) 4.8 - 5.6 % Final    Comment:    (NOTE)         Pre-diabetes: 5.7 - 6.4         Diabetes: >6.4         Glycemic control for adults with diabetes: <7.0     CBG: Recent Labs  Lab 05/12/19 1934 05/12/19 2325 05/13/19 0409 05/13/19 0443 05/13/19 0820  GLUCAP 191* 97 63* 116* 109*    Critical care time: n/a       [name], MSN, NP-C [facility] 05/13/2019, 9:28 AM   Please see [email or web address] for pager details.

## 2019-05-13 NOTE — Progress Notes (Signed)
Phlebotomy came to draw labs this AM because patient is not yet full comfort care. I was told by [facility] RN to pass on labs this morning until patient care status is established. If care is escalated during the day, labs will need to be drawn. RN will update oncoming dayshift nurse.

## 2019-05-13 NOTE — Progress Notes (Signed)
Patient ID: Emma [name], female   DOB: Oct 30, 1945, 74 y.o.   MRN: [id number] Overnight events and notes reviewed. At this time, with assistance from family for decision making, the plan appears to be focusing towards comfort care rather than aggressive interventions.  Her son is traveling from [place].  I will follow the chart from afar and remain available for questions or concerns.  [name] MD [facility]. Office # [phone] Pager # [phone] 12:59 PM

## 2019-05-14 LAB — GLUCOSE, CAPILLARY
Glucose-Capillary: 164 mg/dL — ABNORMAL HIGH (ref 70–99)
Glucose-Capillary: 162 mg/dL — ABNORMAL HIGH (ref 70–99)
Glucose-Capillary: 164 mg/dL — ABNORMAL HIGH (ref 70–99)

## 2019-05-14 MED ORDER — MORPHINE SULFATE (PF) 2 MG/ML IV SOLN
2.0000 mg | INTRAVENOUS | Status: DC | PRN
  Filled 2019-05-14: qty 1

## 2019-05-14 MED ORDER — ATROPINE SULFATE 1 % OP SOLN
1.0000 [drp] | Freq: Four times a day (QID) | OPHTHALMIC | Status: DC | PRN
  Filled 2019-05-14: qty 2

## 2019-05-14 NOTE — Progress Notes (Signed)
Patient has been hypotensive overnight and this AM; MAP ranging from 57-62. Earnest Rosier, critical care NP notified by this RN. No new actions at this time, d/t end of life supportive care. RN will continue to monitor pt.

## 2019-05-14 NOTE — Progress Notes (Signed)
Patient ID: Emma [name], female   DOB: Jan 07, 1946, 74 y.o.   MRN: [id number] Medically surprisingly turn of events overnight; urine output about 4.7 L likely reflective of recovery phase from ATN. Family have decided to go forward with complete comfort measures.  I suspect that with ensuing renal recovery, she may rally and show overall improvement.  We will continue to follow remotely, call with questions.  [name] MD [facility]. Office # [phone] Pager # [phone] 1:14 PM

## 2019-05-14 NOTE — Progress Notes (Signed)
ANTICOAGULATION CONSULT NOTE - Follow Up Consult  Pharmacy Consult for Heparin Indication: hx of DVT, CVA, PAD on Apixaban PTA   No Known Allergies  Patient Measurements: Height: 5\' 5"  (165.1 cm) Weight: 56.1 kg (123 lb 10.9 oz) IBW/kg (Calculated) : 57 Heparin Dosing Weight: TBW  Vital Signs: Temp: 99 F (37.2 C) (04/28 0800) Temp Source: Bladder (04/28 0800) BP: 97/41 (04/28 0800) Pulse Rate: 105 (04/28 0900)  Labs: Recent Labs    05/12/19 0901 05/12/19 0904 05/12/19 1145 05/12/19 1313 05/12/19 1525 05/12/19 2206  HGB 8.9*  --   --   --   --   --   HCT 34.2*  --   --   --   --   --   PLT 742*  --   --   --   --   --   APTT  --  30  --   --   --  169*  LABPROT 19.1*  --   --   --   --   --   INR 1.6*  --   --   --   --   --   HEPARINUNFRC  --   --   --  0.55  --   --   CREATININE 7.05*  --  6.27*  --  6.36*  --   TROPONINIHS  --  35* 68*  --   --   --     Estimated Creatinine Clearance: 7.2 mL/min (A) (by C-G formula based on SCr of 6.36 mg/dL (H)).   Medications:  Infusions:  . ceFEPime (MAXIPIME) IV Stopped (05/14/19 0935)  . heparin 800 Units/hr (05/14/19 0800)  .  sodium bicarbonate (isotonic) infusion in sterile water 150 mL/hr at 05/14/19 0800    Assessment: 73 yoF admitted on 4/26 with sepsis criteria, hx AMS changes x 2 days.  Pharmacy is consulted for heparin IV dosing. PTA Apixaban 5mg  bid for DVT noted 2/26 per doppler, held for renal fx, LD 4/24 (time unknown) Baseline ant-coag: aPTT wnl 30 sec, Hep level elevated 0.55 units/ml - Apixaban effect still active on Hep level with poor renal fx. Will monitor using aPTT until correlate with Hep level.  Today, 05/14/2019: All labs refused by family; expect transition to comfort care. Heparin continues to infuse at 800 units/hr.  (aPTT previously elevated and heparin reduced to 800 units/hr.) No bleeding or complications reported.  Goal of Therapy:  Heparin level 0.3-0.7 units/ml aPTT 66-102  seconds Monitor platelets by anticoagulation protocol: Yes   Plan:  Continue Heparin at 800 unit/hr PCCM aware of heparin dosing without levels.  Planning to either transition to comfort, or monitor heparin using levels and CBC.  5/24 PharmD, BCPS Clinical Pharmacist [facility] main pharmacy [phone] 05/14/2019 10:03 AM

## 2019-05-14 NOTE — Progress Notes (Signed)
Patient's family has made the decision to transition to full comfort care at this time. This RN will provide full comfort measures to pt and family at this time, and monitor pt for signs of pain and discomfort.

## 2019-05-14 NOTE — Progress Notes (Addendum)
NAMEJackie [name], MRN:  [id number], DOB:  12/22/1945, LOS: 2 ADMISSION DATE:  05/12/2019, CONSULTATION DATE: 4/26  REFERRING MD:  Dr. Rush Landmark / EDP, CHIEF COMPLAINT:  Altered Mental Status    Brief History   74 y/o F admitted with reports of AMS.  Found to have UTI, AKI, profound acidosis, hyperkalemia and AMS.     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 DM II  HTN Hypothyroidism   Significant Hospital Events   4/26 Admit with AMS  4/27 Difficulty obtaining labs, remains on heparin & bicarb gtt's 4/28 Family elected for full comfort measures  Consults:  PCCM   Procedures:    Significant Diagnostic Tests:   CT Head 4/26 >> no acute findings, atrophy and chronic small vessel ischemic changes and late subacute left frontal cortical, subcortical infarct as previously seen  Renal US 4/26 >> normal size kidneys without hydronephrosis or focal lesion   Micro Data:  COVID PCR 4/26 >> negative  Influenza A/B 4/26 >> negative  UA 4/26 >> many bacteria, >50 WBC, moderate leukocytes  UC 4/26 >> 100k GNR >> BCx2 4/26 >>   Antimicrobials:  Flagyl 4/26 x1 Cefepime 4/26 >>  Vanco 4/26 >>   Interim history/subjective:  Family at bedside No acute events  Tmax 99   Objective   Blood pressure (!) 113/54, pulse (!) 106, temperature 99 F (37.2 C), temperature source Bladder, resp. rate 16, height 5\' 5"  (1.651 m), weight 56.1 kg, SpO2 100 %.        Intake/Output Summary (Last 24 hours) at 05/14/2019 1233 Last data filed at 05/14/2019 1147 Gross per 24 hour  Intake 3024.61 ml  Output 3950 ml  Net -925.39 ml   Filed Weights   05/12/19 0938 05/13/19 0450 05/14/19 0322  Weight: 65.8 kg 55.9 kg 56.1 kg    Examination: General: elderly female lying in bed in NAD HEENT: MM pink/moist, anicteric Neuro: eyes closed, no response to verbal stimuli CV: s1s2 RRR, no m/r/g PULM:  Non-labored at rest, lungs bilaterally clear GI: soft, bsx4 active  Extremities: warm/dry,  trace upper extremity edema, R AKA Skin: no rashes or lesions   Resolved Hospital Problem list      Assessment & Plan:   Acute Metabolic Encephalopathy  Secondary to GNR UTI with AKI superimposed on baseline CVA.  CT head negative.  -supportive care -transition to full comfort measures   Severe Sepsis with Urinary Source GNR UTI GNR UTI on UC -will stop antibiotics in light of transition to comfort care -all measures to support patients comfort  -PRN morphine for evidence of pain or shortness of breath  -may need inpatient hospice home pending course  AKI  AGMA / Lactic Acidosis  Hyperkalemia  -stop bicarbonate gtt -no further labs  Hx HTN, CVA -full comfort measures    DM II  -hold further finger sticks  Hypothyroidism  -hold medications   Best practice:  Diet: NPO  Pain/Anxiety/Delirium protocol (if indicated): n/a  VAP protocol (if indicated): n/a  DVT prophylaxis: n/a  GI prophylaxis: n/a  Glucose control: n/a Mobility: BR  Code Status: DNR  Family Communication: Patients son 05/16/19) and daughter in law updated on patients exam.  They indicate that the patient does not seem to be making progress with conservative medical management and they are concerned she is suffering.  Will transition to full comfort measures.  Reviewed the concept of use of medications for pain, shortness of breath and the possibility that she may  need an admission to hospice home. Disposition: SDU  Labs   CBC: Recent Labs  Lab 05/12/19 0901  WBC 24.2*  NEUTROABS 18.5*  HGB 8.9*  HCT 34.2*  MCV 119.2*  PLT 742*    Basic Metabolic Panel: Recent Labs  Lab 05/12/19 0901 05/12/19 0904 05/12/19 1145 05/12/19 1525  NA 144  --  143 145  K 6.8*  --  6.1* 5.8*  CL 98  --  102 104  CO2 <7*  --  <7* <7*  GLUCOSE 123*  --  254* 412*  BUN 72*  --  69* 74*  CREATININE 7.05*  --  6.27* 6.36*  CALCIUM 9.8  --  9.0 8.9  MG  --  2.8*  --   --    GFR: Estimated Creatinine  Clearance: 7.2 mL/min (A) (by C-G formula based on SCr of 6.36 mg/dL (H)). Recent Labs  Lab 05/12/19 0901 05/12/19 1145 05/12/19 1525  WBC 24.2*  --   --   LATICACIDVEN >11.0* >11.0* >11.0*    Liver Function Tests: Recent Labs  Lab 05/12/19 0901  AST 31  ALT 16  ALKPHOS 72  BILITOT 0.8  PROT 7.5  ALBUMIN 3.5   Recent Labs  Lab 05/12/19 0904  LIPASE 43   No results for input(s): AMMONIA in the last 168 hours.  ABG    Component Value Date/Time   PHART 7.382 03/26/2018 1820   PCO2ART 42.0 03/26/2018 1820   PO2ART 62.0 (L) 03/26/2018 1820   HCO3 NOT CALCULATED 05/12/2019 0958   TCO2 28 03/13/2019 1645   ACIDBASEDEF NOT CALCULATED 05/12/2019 0958   O2SAT 85.4 05/12/2019 0958     Coagulation Profile: Recent Labs  Lab 05/12/19 0901  INR 1.6*    Cardiac Enzymes: No results for input(s): CKTOTAL, CKMB, CKMBINDEX, TROPONINI in the last 168 hours.  HbA1C: Hgb A1c MFr Bld  Date/Time Value Ref Range Status  03/14/2019 03:41 AM 11.1 (H) 4.8 - 5.6 % Final    Comment:    (NOTE) Pre diabetes:          5.7%-6.4% Diabetes:              >6.4% Glycemic control for   <7.0% adults with diabetes   09/08/2014 01:50 PM 6.4 (H) 4.8 - 5.6 % Final    Comment:    (NOTE)         Pre-diabetes: 5.7 - 6.4         Diabetes: >6.4         Glycemic control for adults with diabetes: <7.0     CBG: Recent Labs  Lab 05/13/19 1946 05/13/19 2322 05/14/19 0414 05/14/19 0758 05/14/19 1219  GLUCAP 98 116* 164* 162* 164*    Critical care time: n/a       [name], MSN, NP-C [facility] 05/14/2019, 12:33 PM   Please see [email or web address] for pager details.

## 2019-05-15 LAB — GLUCOSE, CAPILLARY: Glucose-Capillary: 192 mg/dL — ABNORMAL HIGH (ref 70–99)

## 2019-05-15 LAB — URINE CULTURE: Culture: >=100000 — AB

## 2019-05-15 MED ORDER — MORPHINE SULFATE (PF) 2 MG/ML IV SOLN
1.0000 mg | INTRAVENOUS | Status: DC | PRN
  Administered 2019-05-15: 1 mg via INTRAVENOUS

## 2019-05-15 MED ORDER — CHLORHEXIDINE GLUCONATE CLOTH 2 % EX PADS
6.0000 | MEDICATED_PAD | Freq: Every day | CUTANEOUS | Status: DC
  Administered 2019-05-15 – 2019-05-16 (×2): 6 via TOPICAL

## 2019-05-15 NOTE — Progress Notes (Addendum)
NAMEJuleen [name], MRN:  [id number], DOB:  01/24/1945, LOS: 3 ADMISSION DATE:  05/12/2019, CONSULTATION DATE: 4/26  REFERRING MD:  Dr. Sherry Ruffing / EDP, CHIEF COMPLAINT:  Altered Mental Status    Brief History   74 y/o F admitted with reports of AMS.  Found to have UTI, AKI, profound acidosis, hyperkalemia and AMS.     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 DM II  HTN Hypothyroidism   Significant Hospital Events   4/26 Admit with AMS  4/27 Difficulty obtaining labs, remains on heparin & bicarb gtt's 4/28 Family elected for full comfort measures  Consults:  PCCM   Procedures:    Significant Diagnostic Tests:   CT Head 4/26 >> no acute findings, atrophy and chronic small vessel ischemic changes and late subacute left frontal cortical, subcortical infarct as previously seen  Renal US 4/26 >> normal size kidneys without hydronephrosis or focal lesion   Micro Data:  COVID PCR 4/26 >> negative  Influenza A/B 4/26 >> negative  UA 4/26 >> many bacteria, >50 WBC, moderate leukocytes  UC 4/26 >> E-Coli >> pan-sensitive BCx2 4/26 >>   Antimicrobials:  Flagyl 4/26 x1 Cefepime 4/26 >>  Vanco 4/26 >>   Interim history/subjective:  RN reports pt grimaces with bathing / movement etc Tmax 99.7  Objective   Blood pressure (!) 92/45, pulse (!) 112, temperature 99.7 F (37.6 C), temperature source Bladder, resp. rate (!) 25, height 5\' 5"  (1.651 m), weight 57.5 kg, SpO2 100 %.        Intake/Output Summary (Last 24 hours) at 05/15/2019 1044 Last data filed at 05/15/2019 4174 Gross per 24 hour  Intake 794.23 ml  Output 4250 ml  Net -3455.77 ml   Filed Weights   05/13/19 0450 05/14/19 0322 05/15/19 0500  Weight: 55.9 kg 56.1 kg 57.5 kg    Examination: General:  Elderly female lying in bed in NAD HEENT: MM pink/moist, pupils =/reactive Neuro: no response to verbal stimuli, withdraw to pain, grimaces with stimulation CV: s1s2 rrr, no m/r/g PULM:  Tachypnea, lungs  bilaterally clear GI: soft, bsx4 active  Extremities: warm/dry, trace edema  Skin: no rashes or lesions  Resolved Hospital Problem list      Assessment & Plan:   Acute Metabolic Encephalopathy  Secondary to E-Coli UTI with AKI superimposed on baseline CVA.  CT head negative.  -continue supportive care -comfort measures  Severe Sepsis with Urinary Source E-Coli UTI Pan-sensitive, treated with cefepime -PRN morphine for evidence of pain, shortness of breath / tachypnea  -will ask case management to assess for inpatient hospice  AKI  AGMA / Lactic Acidosis  Hyperkalemia  -no further labs -note improvement in UOP   Hx HTN, CVA -comfort measures   DM II  -hold further finger sticks   Hypothyroidism  -hold PO medications   Best practice:  Diet: NPO  Pain/Anxiety/Delirium protocol (if indicated): n/a  VAP protocol (if indicated): n/a  DVT prophylaxis: n/a  GI prophylaxis: n/a  Glucose control: n/a Mobility: BR  Code Status: DNR  Family Communication: 4/28 Patients son Emma [name]) and daughter in law updated on patients exam.  They indicate that the patient does not seem to be making progress with conservative medical management and they are concerned she is suffering.  Will transition to full comfort measures.  Reviewed the concept of use of medications for pain, shortness of breath and the possibility that she may need an admission to hospice home. 4/29 - will update family on  arrival.  Disposition: SDU, will transfer out of unit to palliative floor once family informed and to [facility] as of 4/30 am  Labs   CBC: Recent Labs  Lab 05/12/19 0901  WBC 24.2*  NEUTROABS 18.5*  HGB 8.9*  HCT 34.2*  MCV 119.2*  PLT 742*    Basic Metabolic Panel: Recent Labs  Lab 05/12/19 0901 05/12/19 0904 05/12/19 1145 05/12/19 1525  NA 144  --  143 145  K 6.8*  --  6.1* 5.8*  CL 98  --  102 104  CO2 <7*  --  <7* <7*  GLUCOSE 123*  --  254* 412*  BUN 72*  --  69* 74*  CREATININE  7.05*  --  6.27* 6.36*  CALCIUM 9.8  --  9.0 8.9  MG  --  2.8*  --   --    GFR: Estimated Creatinine Clearance: 7.3 mL/min (A) (by C-G formula based on SCr of 6.36 mg/dL (H)). Recent Labs  Lab 05/12/19 0901 05/12/19 1145 05/12/19 1525  WBC 24.2*  --   --   LATICACIDVEN >11.0* >11.0* >11.0*    Liver Function Tests: Recent Labs  Lab 05/12/19 0901  AST 31  ALT 16  ALKPHOS 72  BILITOT 0.8  PROT 7.5  ALBUMIN 3.5   Recent Labs  Lab 05/12/19 0904  LIPASE 43   No results for input(s): AMMONIA in the last 168 hours.  ABG    Component Value Date/Time   PHART 7.382 03/26/2018 1820   PCO2ART 42.0 03/26/2018 1820   PO2ART 62.0 (L) 03/26/2018 1820   HCO3 NOT CALCULATED 05/12/2019 0958   TCO2 28 03/13/2019 1645   ACIDBASEDEF NOT CALCULATED 05/12/2019 0958   O2SAT 85.4 05/12/2019 0958     Coagulation Profile: Recent Labs  Lab 05/12/19 0901  INR 1.6*    Cardiac Enzymes: No results for input(s): CKTOTAL, CKMB, CKMBINDEX, TROPONINI in the last 168 hours.  HbA1C: Hgb A1c MFr Bld  Date/Time Value Ref Range Status  03/14/2019 03:41 AM 11.1 (H) 4.8 - 5.6 % Final    Comment:    (NOTE) Pre diabetes:          5.7%-6.4% Diabetes:              >6.4% Glycemic control for   <7.0% adults with diabetes   09/08/2014 01:50 PM 6.4 (H) 4.8 - 5.6 % Final    Comment:    (NOTE)         Pre-diabetes: 5.7 - 6.4         Diabetes: >6.4         Glycemic control for adults with diabetes: <7.0     CBG: Recent Labs  Lab 05/13/19 2322 05/14/19 0414 05/14/19 0758 05/14/19 1219 05/15/19 0909  GLUCAP 116* 164* 162* 164* 192*    Critical care time: n/a       Canary Brim, MSN, NP-C [facility] 05/15/2019, 10:44 AM   Please see [email or web address] for pager details.

## 2019-05-15 NOTE — Progress Notes (Signed)
Spiritual care providing support with this family around initiation of comfort measures.  Son, Minerva Areola, and Daughter in law at bedside.  Facilitated reflection on values, eulogizing, normalizing of emotions, grief support, and prayers.    Will follow for continued support as this family progresses.     Burnis Kingfisher, MDiv, [facility]

## 2019-05-16 NOTE — Progress Notes (Signed)
Gave report to [facility] and all questions answered. Family at bedside and notified on transfer scheduled for this afternoon. Will continue to monitor until discharged from hospital.

## 2019-05-16 NOTE — Progress Notes (Signed)
[facility]:  Spoke with pt's son Minerva Areola who is in town with his wife from [place]. Discussed hospitalization events, hospice care and philosophy of comfort care. They are in agreement with the services. Pt was approved by our MD and can [facility] today if MD in agreement.   Arna Medici CM will follow up with MD and arrange transport for ambulance for dishcarge. Norm Parcel RN [phone] Thank you for this referral.

## 2019-05-16 NOTE — Discharge Summary (Signed)
Physician Discharge Summary  Patient ID: Emma [name] MRN: [id number] DOB/AGE: 72-04-1945 72 y.o.  Admit date: 05/12/2019 Discharge date: 05/16/2019    Discharge Diagnoses:  E-Coli UTI with Severe Sepsis  Acute Metabolic Encephalopathy  AKI  AGMA  Lactic Acidosis  Hyperkalemia  HTN  CVA DM II  Hypothyroidism                                                                      DISCHARGE PLAN BY DIAGNOSIS      E-Coli UTI with Severe Sepsis  Acute Metabolic Encephalopathy  AKI  AGMA  Lactic Acidosis  Hyperkalemia  HTN  Hx L MCA CVA DM II  Hypothyroidism   Discharge Plan: Transfer to [place] Full comfort measures DNR / DNI.  Gold DNR form signed for transport  Continue all measures to promote patients comfort.  She has used limited morphine (only 1 dose of '1mg'$  for tachypnea on 4/29).                       DISCHARGE SUMMARY   74 y/o F admitted to [facility] on 4/26 with reports of 48 hours of altered mental status.  She was recently admitted from 2/25-03/20/19 in the setting of left MCA CVA.  That hospitalization was complicated by LLE DVT treated with Eliquis.  After that hospitalization, she returned home under the care of her daughter-in-law.  The patient was largely bed bound with right sided weakness.  She had difficulty with complaining of pain on the right side.  The patient apparently returned to smoking at home after discharge.  The 48 hours prior to admit, she did not smoke and had less oral intake.  On admit, she was found to have a pH of 6.8, CO2 <7, Sr Cr 7.05, lipase 43, WBC 24.2, hgb 8.9 and platelets 742 with a left shift on differential.  UA was notable for moderate leukocytes, many bacteria and >50 WBC. She was altered and unable to endorse any urinary symptoms.  CT Head was negative for acute findings.  She was treated in the ER with IVF, antibiotics, insulin and dextrose.  She was admitted to [facility] service for further care. After discussion with her son  Randall Hiss, he indicated that his mother did not want life support or CPR after her recent illness / experience in the hospital.  Plan of care included conservative medical management with IVF's, bicarbonate, and antibiotics.  Urine cultures obtained grew out pan-sensitive E-Coli.  She was treated with IV cefepime.  Renal US was negative for hydronephrosis.  Nephrology was consulted but the patient was felt not to be a candidate for renal replacement therapy.  Heparin gtt was initiated for known history of DVT.  Her son flew in from [place]. On 4/28 after attempts to obtain labs, and unable to after multiple sticks, the family elected for full comfort measures.  She was transitioned to full comfort care.  She received '1mg'$  IV morphine on 4/29 for tachypnea.  The patient has been largely non-verbal since admission with limited movement.  Her family indicates that she had been making statements that she was ready to go be with her husband.  The patient was medically cleared for discharge on 4/30 to inpatient  hospice with plans as above.              SIGNIFICANT DIAGNOSTIC STUDIES  CT Head 4/26 >> no acute findings, atrophy and chronic small vessel ischemic changes and late subacute left frontal cortical, subcortical infarct as previously seen  Renal US 4/26 >> normal size kidneys without hydronephrosis or focal lesion  MICRO DATA  COVID PCR 4/26 >> negative  Influenza A/B 4/26 >> negative  UA 4/26 >> many bacteria, >50 WBC, moderate leukocytes  UC 4/26 >> E-Coli >> pan-sensitive BCx2 4/26 >>   ANTIBIOTICS Flagyl 4/26 x1 Cefepime 4/26 >> 4/28 Vanco 4/26 >> 4/28  CONSULTS PCCM    Discharge Exam: General: elderly female lying in bed in NAD  HEENT: MM pink/dry, no jvd Neuro: eyes open, does not track, no response to verbal stimuli, head to the left, occasional spontaneous movement of the left hand CV: s1s2 rrr, no m/r/g PULM: shallow, non-labored, lungs bilaterally clear GI: soft, bsx4 active   Extremities: warm/dry, trace to 1+ generalized UE edema  Skin: no rashes or lesions  Vitals:   05/15/19 2200 05/16/19 0000 05/16/19 0400 05/16/19 0500  BP: 134/60 (!) 136/57 (!) 139/58   Pulse:      Resp: '15 20 20   '$ Temp: 99 F (37.2 C)     TempSrc: Bladder     SpO2: 100% 100%    Weight:    50.8 kg  Height:         Discharge Labs  BMET Recent Labs  Lab 05/12/19 0901 05/12/19 0901 05/12/19 0904 05/12/19 1145 05/12/19 1525  NA 144  --   --  143 145  K 6.8*   < >  --  6.1* 5.8*  CL 98  --   --  102 104  CO2 <7*  --   --  <7* <7*  GLUCOSE 123*  --   --  254* 412*  BUN 72*  --   --  69* 74*  CREATININE 7.05*  --   --  6.27* 6.36*  CALCIUM 9.8  --   --  9.0 8.9  MG  --   --  2.8*  --   --    < > = values in this interval not displayed.    CBC Recent Labs  Lab 05/12/19 0901  HGB 8.9*  HCT 34.2*  WBC 24.2*  PLT 742*    Anti-Coagulation Recent Labs  Lab 05/12/19 0901  INR 1.6*    Discharge Instructions    Call MD for:  difficulty breathing, headache or visual disturbances   Complete by: As directed    Call MD for:  persistant nausea and vomiting   Complete by: As directed    Call MD for:  severe uncontrolled pain   Complete by: As directed    Call MD for:  temperature >100.4   Complete by: As directed    Discharge instructions   Complete by: As directed    1. Leave IV in place for transfer  2. Hospice will further manage palliative care medications on admission.   Increase activity slowly   Complete by: As directed         Allergies as of 05/16/2019   No Known Allergies     Medication List    STOP taking these medications   acetaminophen 325 MG tablet Commonly known as: TYLENOL   apixaban 5 MG Tabs tablet Commonly known as: ELIQUIS   atorvastatin 40 MG tablet Commonly known as: LIPITOR  benzonatate 100 MG capsule Commonly known as: TESSALON   bisacodyl 10 MG suppository Commonly known as: DULCOLAX   docusate sodium 100 MG  capsule Commonly known as: COLACE   Foltanx 3-35-2 MG Tabs   gabapentin 600 MG tablet Commonly known as: NEURONTIN   levothyroxine 100 MCG tablet Commonly known as: SYNTHROID   metFORMIN 1000 MG tablet Commonly known as: GLUCOPHAGE   tetrahydrozoline 0.05 % ophthalmic solution Commonly known as: Visine   traMADol 50 MG tablet Commonly known as: ULTRAM        Disposition: Hospice Home   Discharged Condition: Emma [name] has met maximum benefit of inpatient care and is medically stable and cleared for discharge.  Patient is pending follow up as above.      Time spent on disposition: 35 Minutes.   Signed: [name], NP-C [facility] Pgr: [phone] Office: [phone]

## 2019-05-16 NOTE — Progress Notes (Signed)
[facility] is here to transport to [facility]

## 2019-05-16 NOTE — TOC Initial Note (Signed)
Transition of Care [facility]) - Initial/Assessment Note    Patient Details  Name: Emma [name] MRN: [id number] Date of Birth: 1946/01/04  Transition of [facility] CM/SW Contact:    Bartholome Bill, RN Phone Number: 05/16/2019, 2:01 PM  Clinical Narrative:                 [facility] consult for Hospice. This CM spoke with son Minerva Areola and daughter in law about dc. Minerva Areola is requesting that pt be evaluated for residential hospice. Minerva Areola says the family is staying in [facility] and would like pt to go to [facility] [employer]. HOP liaison contacted and pt to transfer via [facility]. RN to call report to [phone].   Expected Discharge Plan: Hospice Medical Facility Barriers to Discharge: No Barriers Identified   Expected Discharge Plan and Services Expected Discharge Plan: Hospice Medical Facility       Activities of Daily Living Home Assistive Devices/Equipment: Bedside commode/3-in-1, Other (Comment), Walker (specify type), Cane (specify quad or straight), CBG Meter, Wheelchair(ramp entrance, front wheeled walker, tub/shower, single point cane) ADL Screening (condition at time of admission) Patient's cognitive ability adequate to safely complete daily activities?: No Is the patient deaf or have difficulty hearing?: No Does the patient have difficulty seeing, even when wearing glasses/contacts?: No Does the patient have difficulty concentrating, remembering, or making decisions?: Yes Patient able to express need for assistance with ADLs?: Yes Does the patient have difficulty dressing or bathing?: Yes Independently performs ADLs?: No Communication: Independent Dressing (OT): Needs assistance Is this a change from baseline?: Change from baseline, expected to last >3 days Grooming: Needs assistance Is this a change from baseline?: Change from baseline, expected to last >3 days Feeding: Needs assistance Is this a change from baseline?: Change from baseline, expected to last >3 days Bathing:  Needs assistance(Patient takes sponge baths) Is this a change from baseline?: Change from baseline, expected to last >3 days Toileting: Needs assistance Is this a change from baseline?: Pre-admission baseline In/Out Bed: Needs assistance Is this a change from baseline?: Pre-admission baseline Walks in Home: Dependent(patient is bedbound) Is this a change from baseline?: Pre-admission baseline Does the patient have difficulty walking or climbing stairs?: Yes Weakness of Legs: Left(Right AKA) Weakness of Arms/Hands: Right  Permission Sought/Granted                  Emotional Assessment Appearance:: Appears stated age            Admission diagnosis:  Cough [R05] Hyperkalemia [E87.5] AKI (acute kidney injury) (HCC) [N17.9] Sepsis (HCC) [A41.9] Altered mental status, unspecified altered mental status type [R41.82] Sepsis with acute renal failure, due to unspecified organism, unspecified acute renal failure type, unspecified whether septic shock present (HCC) [A41.9, R65.20, N17.9] Patient Active Problem List   Diagnosis Date Noted  . Pressure injury of skin 05/13/2019  . AKI (acute kidney injury) (HCC) 05/12/2019  . Acute kidney injury (HCC) 03/22/2019  . Hypothyroidism 03/22/2019  . Dry eyes 03/22/2019  . DVT (deep venous thrombosis) (HCC) 03/14/2019  . TIA (transient ischemic attack) 03/13/2019  . Stroke (cerebrum) (HCC) 03/13/2019  . Acute ischemic left MCA stroke (HCC)   . PAD (peripheral artery disease) (HCC) 03/23/2018  . Back pain 09/09/2014  . Hyperlipidemia associated with type 2 diabetes mellitus (HCC) 12/18/2006  . RECTAL POLYPS 12/18/2006  . Controlled diabetes mellitus type 2 with complications (HCC) 08/01/2006  . SBO 08/01/2006  . GOITER NOS 03/11/2006  . Diabetic peripheral neuropathy associated with type 2  diabetes mellitus ([place]) 03/11/2006  . HYPERTENSION 03/11/2006  . OSTEOPENIA 03/11/2006  . FOOT DROP 03/11/2006   PCP:  Clovia Cuff,  MD Pharmacy:   [facility] [address] [place], [address] [place] [address] Phone: [phone] Fax: [phone]  [facility] [place], [place] [address] Phone: [phone] Fax: [phone]  [place], [address] [place] [address] Phone: [phone] Fax: [phone]     Social Determinants of Health (SDOH) Interventions    Readmission Risk Interventions No flowsheet data found.

## 2019-05-16 NOTE — Care Management Important Message (Signed)
Important Message  Patient Details IM Letter given to Sandford Craze RN Case Manager to present to the Patient Name: Emma [name] MRN: [id number] Date of Birth: [birth year]   Medicare Important Message Given:  Yes     Caren Macadam 05/16/2019, 10:02 AM

## 2019-05-17 LAB — CULTURE, BLOOD (ROUTINE X 2)
Culture: NO GROWTH
Culture: NO GROWTH
Special Requests: ADEQUATE

## 2019-06-17 DEATH — deceased

## 2020-12-13 IMAGING — CT CT HEAD W/O CM
4 series · 16 of 47 positions shown, 18 images · non-contrast
Comparison: 03/13/2019

CLINICAL DATA: Stroke patient on anticoagulation with worsening
exam

EXAM:
CT HEAD WITHOUT CONTRAST
TECHNIQUE: Contiguous axial images were obtained from the base of the skull
through the vertex without intravenous contrast.

[Series 2: head wo · axial · 0.43mm/px · z∈[+1145,+1265]mm · 7 of 34 slices shown, 9 images]
[im 5/34  brain]
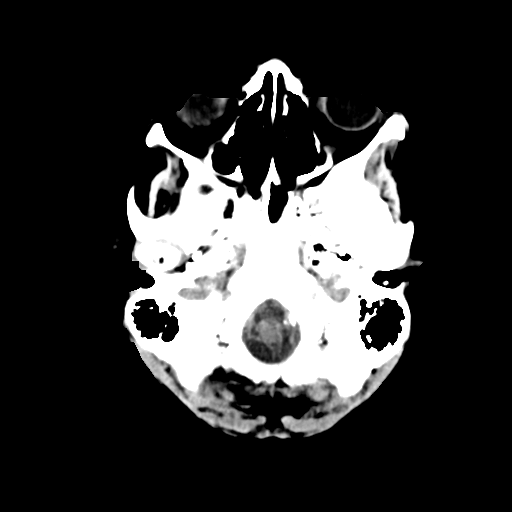
[im 5/34  bone]
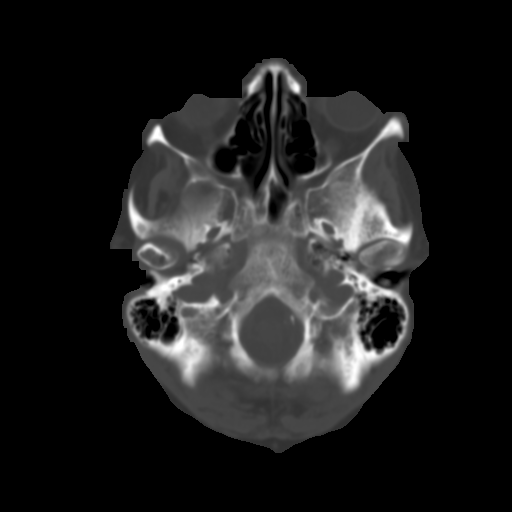
[im 9/34  brain]
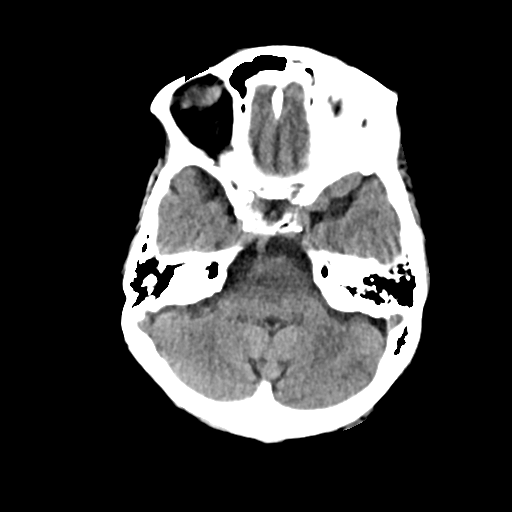
[im 13/34  brain]
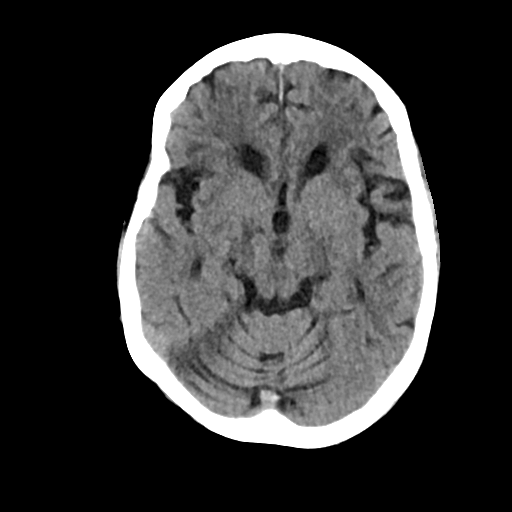
[im 17/34  brain]
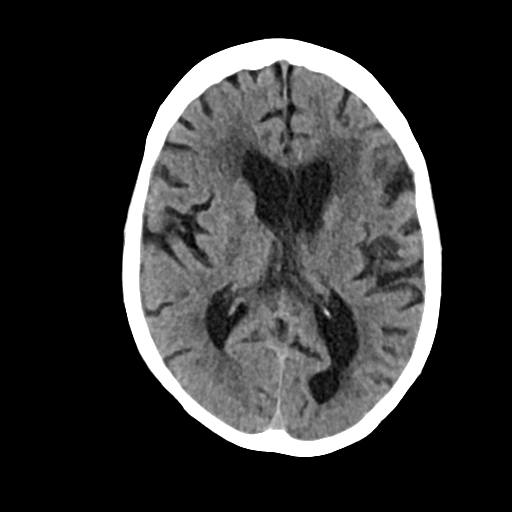
[im 21/34  brain]
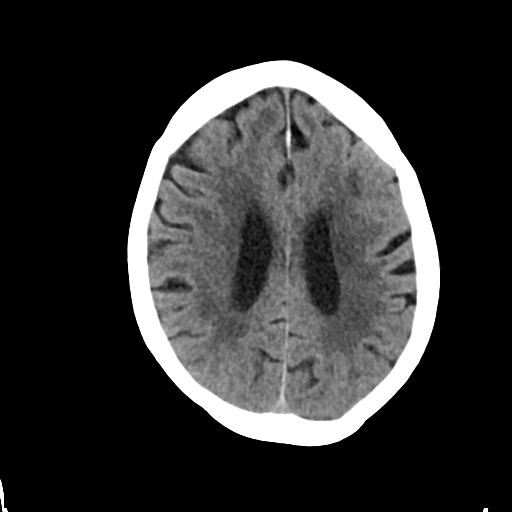
[im 21/34  bone]
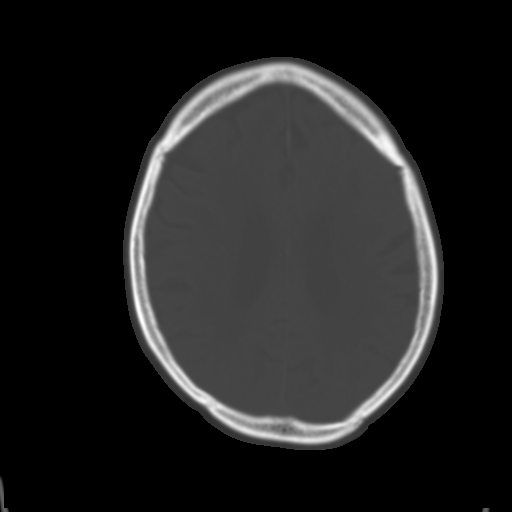
[im 25/34  brain]
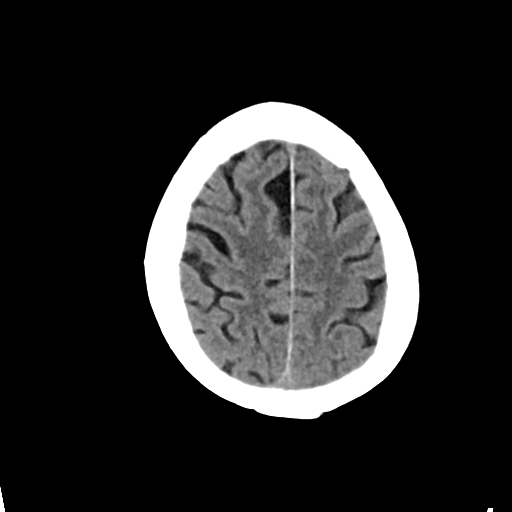
[im 29/34  brain]
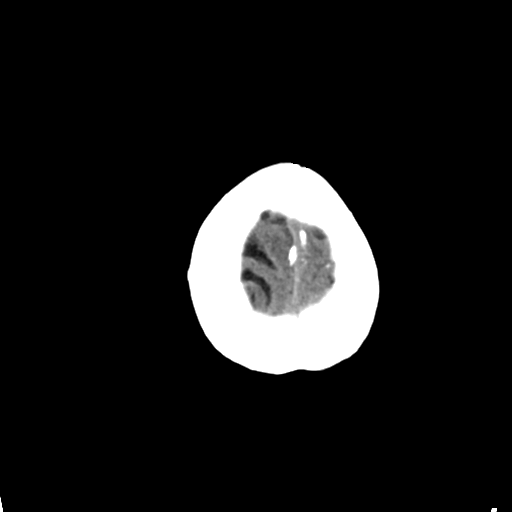

[Series 3: head bone · axial · 0.43mm/px · z∈[+1141,+1173]mm · 3 of 84 slices shown]
[im 9/84  bone]
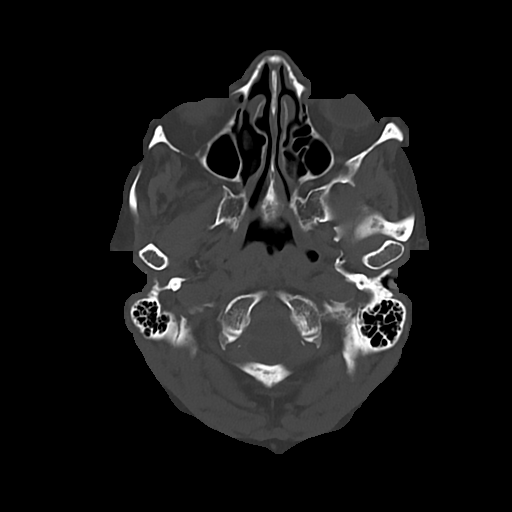
[im 17/84  bone]
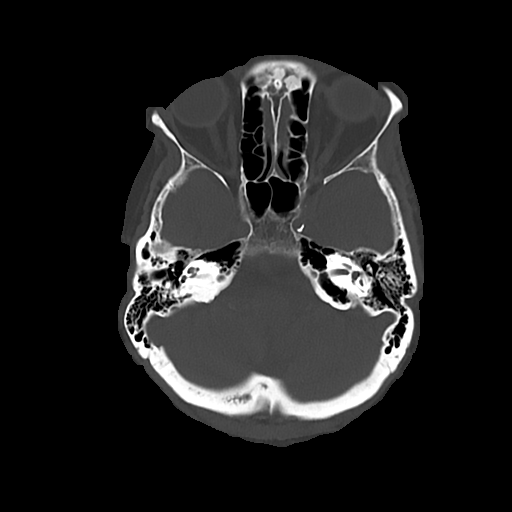
[im 25/84  bone]
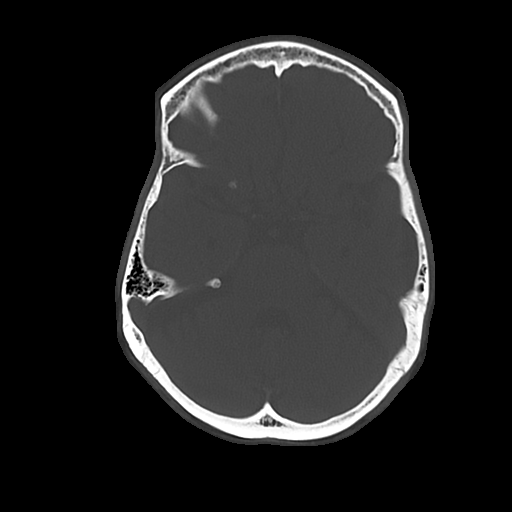

[Series 4: cor soft · coronal · 0.31mm/px · 3 of 68 slices shown]
[im 23/68  brain]
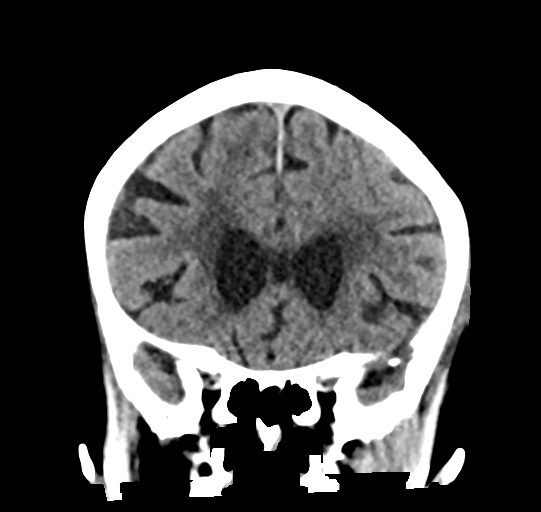
[im 30/68  brain]
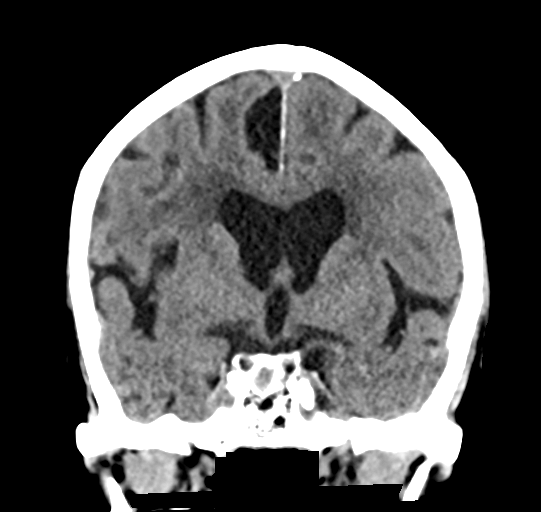
[im 38/68  brain]
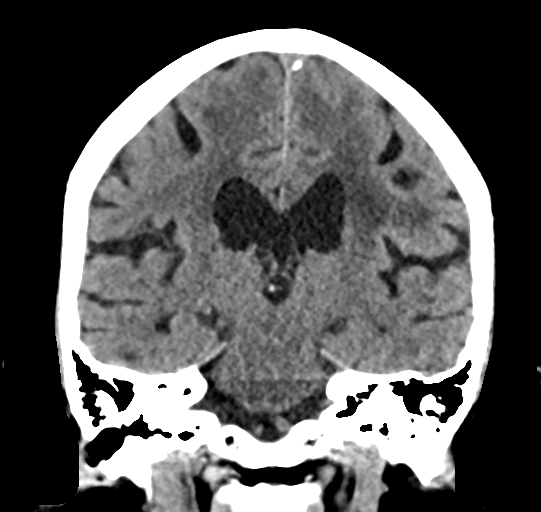

[Series 5: sag soft · sagittal · 0.31mm/px · 3 of 57 slices shown]
[im 19/57  brain]
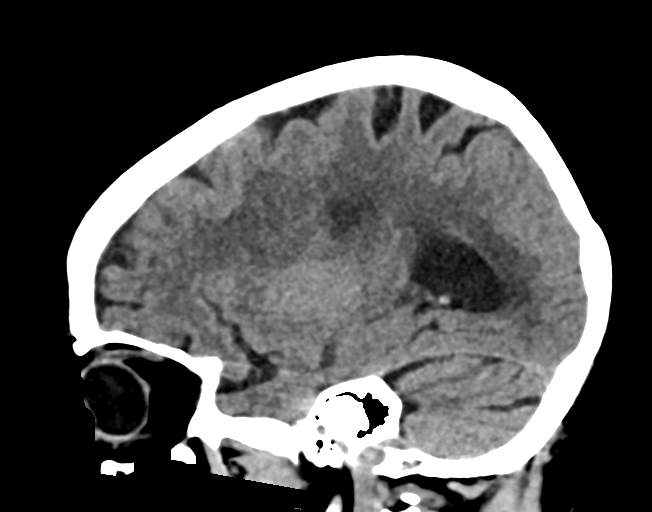
[im 29/57  brain]
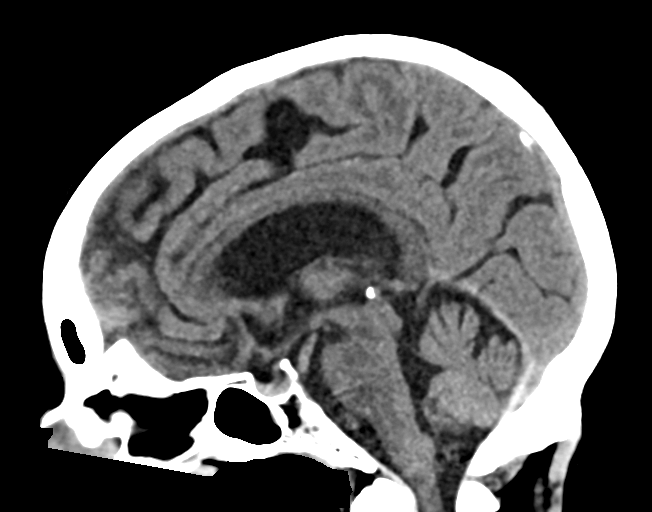
[im 38/57  brain]
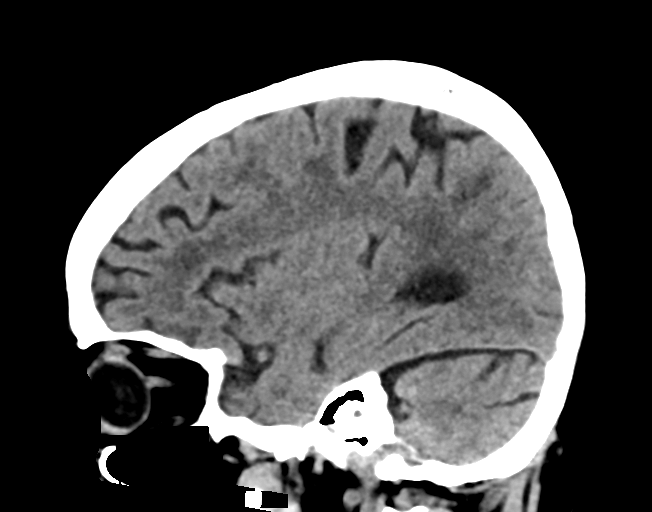

[16 of 47 positions shown; findings below may reference images not displayed]

FINDINGS: Brain: There is new hypoattenuation in the left frontal lobe
corresponding to area of infarction on MRI. Extent of involvement
appears larger than area of diffusion restriction. There is no acute
intracranial hemorrhage or significant mass effect. No additional
new loss of gray-white differentiation.

Ventricles and sulci are stable in size and configuration. Patchy
and confluent areas of hypoattenuation in the supratentorial white
matter likely reflect stable chronic microvascular ischemic changes.

Vascular: No new finding.

Skull: Calvarium is unremarkable.

Sinuses/Orbits: No acute finding.

Other: None.
IMPRESSION: Evolving recent infarction within the left frontal lobe likely with
mild progression in size compared to the recent MRI. No acute
intracranial hemorrhage or significant mass effect.
